# Patient Record
Sex: Male | Born: 1945
Health system: Southern US, Community
[De-identification: ages and names within clinical notes are randomized; demographics above are authoritative.]

## PROBLEM LIST (undated history)

## (undated) ENCOUNTER — Emergency Department (HOSPITAL_COMMUNITY): Admission: EM | Payer: Self-pay | Source: Home / Self Care

## (undated) DIAGNOSIS — E785 Hyperlipidemia, unspecified: Secondary | ICD-10-CM

## (undated) DIAGNOSIS — K219 Gastro-esophageal reflux disease without esophagitis: Secondary | ICD-10-CM

## (undated) DIAGNOSIS — E119 Type 2 diabetes mellitus without complications: Secondary | ICD-10-CM

## (undated) DIAGNOSIS — K2270 Barrett's esophagus without dysplasia: Secondary | ICD-10-CM

## (undated) DIAGNOSIS — I10 Essential (primary) hypertension: Secondary | ICD-10-CM

## (undated) DIAGNOSIS — N529 Male erectile dysfunction, unspecified: Secondary | ICD-10-CM

## (undated) DIAGNOSIS — D509 Iron deficiency anemia, unspecified: Secondary | ICD-10-CM

## (undated) DIAGNOSIS — N183 Chronic kidney disease, stage 3 unspecified: Secondary | ICD-10-CM

## (undated) DIAGNOSIS — T7840XA Allergy, unspecified, initial encounter: Secondary | ICD-10-CM

## (undated) DIAGNOSIS — K209 Esophagitis, unspecified without bleeding: Secondary | ICD-10-CM

## (undated) HISTORY — PX: UPPER ESOPHAGEAL ENDOSCOPIC ULTRASOUND (EUS): SHX6562

## (undated) HISTORY — DX: Type 2 diabetes mellitus without complications: E11.9

## (undated) HISTORY — DX: Allergy, unspecified, initial encounter: T78.40XA

## (undated) HISTORY — PX: FRACTURE SURGERY: SHX138

## (undated) HISTORY — PX: CARPAL TUNNEL RELEASE: SHX101

## (undated) HISTORY — DX: Chronic kidney disease, stage 3 unspecified: N18.30

## (undated) HISTORY — DX: Hyperlipidemia, unspecified: E78.5

## (undated) HISTORY — DX: Chronic kidney disease, stage 3 (moderate): N18.3

## (undated) HISTORY — DX: Essential (primary) hypertension: I10

## (undated) HISTORY — DX: Iron deficiency anemia, unspecified: D50.9

---
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
## 2004-03-22 ENCOUNTER — Ambulatory Visit: Payer: Self-pay | Admitting: Internal Medicine

## 2006-01-31 ENCOUNTER — Ambulatory Visit (HOSPITAL_BASED_OUTPATIENT_CLINIC_OR_DEPARTMENT_OTHER): Admission: RE | Admit: 2006-01-31 | Discharge: 2006-01-31 | Payer: Self-pay | Admitting: Orthopedic Surgery

## 2012-10-26 ENCOUNTER — Ambulatory Visit: Payer: Self-pay | Admitting: Gastroenterology

## 2012-10-27 LAB — PATHOLOGY REPORT

## 2013-10-01 ENCOUNTER — Ambulatory Visit: Payer: Self-pay | Admitting: Nephrology

## 2013-11-05 ENCOUNTER — Ambulatory Visit: Payer: Self-pay | Admitting: Gastroenterology

## 2013-11-05 LAB — BASIC METABOLIC PANEL
Anion Gap: 4 — ABNORMAL LOW (ref 7–16)
BUN: 13 mg/dL (ref 7–18)
CALCIUM: 9.1 mg/dL (ref 8.5–10.1)
CHLORIDE: 107 mmol/L (ref 98–107)
CO2: 26 mmol/L (ref 21–32)
Creatinine: 1.34 mg/dL — ABNORMAL HIGH (ref 0.60–1.30)
EGFR (Non-African Amer.): 54 — ABNORMAL LOW
GLUCOSE: 145 mg/dL — AB (ref 65–99)
OSMOLALITY: 277 (ref 275–301)
Potassium: 4.7 mmol/L (ref 3.5–5.1)
SODIUM: 137 mmol/L (ref 136–145)

## 2013-11-09 LAB — PATHOLOGY REPORT

## 2013-11-25 ENCOUNTER — Ambulatory Visit: Payer: Self-pay | Admitting: Gastroenterology

## 2013-11-29 LAB — PATHOLOGY REPORT

## 2014-05-17 ENCOUNTER — Ambulatory Visit: Payer: Self-pay | Admitting: Family Medicine

## 2014-05-20 ENCOUNTER — Ambulatory Visit (INDEPENDENT_AMBULATORY_CARE_PROVIDER_SITE_OTHER): Payer: Medicare Other | Admitting: Cardiovascular Disease

## 2014-05-20 ENCOUNTER — Encounter: Payer: Self-pay | Admitting: Cardiovascular Disease

## 2014-05-20 ENCOUNTER — Encounter (INDEPENDENT_AMBULATORY_CARE_PROVIDER_SITE_OTHER): Payer: Self-pay

## 2014-05-20 VITALS — BP 138/82 | HR 84 | Ht 71.0 in | Wt 221.5 lb

## 2014-05-20 DIAGNOSIS — E785 Hyperlipidemia, unspecified: Secondary | ICD-10-CM

## 2014-05-20 DIAGNOSIS — I10 Essential (primary) hypertension: Secondary | ICD-10-CM

## 2014-05-20 DIAGNOSIS — I129 Hypertensive chronic kidney disease with stage 1 through stage 4 chronic kidney disease, or unspecified chronic kidney disease: Secondary | ICD-10-CM | POA: Insufficient documentation

## 2014-05-20 DIAGNOSIS — R069 Unspecified abnormalities of breathing: Secondary | ICD-10-CM | POA: Insufficient documentation

## 2014-05-20 DIAGNOSIS — R0609 Other forms of dyspnea: Secondary | ICD-10-CM

## 2014-05-20 DIAGNOSIS — R9431 Abnormal electrocardiogram [ECG] [EKG]: Secondary | ICD-10-CM

## 2014-05-20 DIAGNOSIS — R079 Chest pain, unspecified: Secondary | ICD-10-CM

## 2014-05-20 HISTORY — DX: Essential (primary) hypertension: I10

## 2014-05-20 NOTE — Assessment & Plan Note (Signed)
BP is well controlled 

## 2014-05-20 NOTE — Progress Notes (Signed)
     Paul Spears Date of Birth  [birth year]       [facility] [address], [address], [place], [place]  [address]   [place], [place]  [address] [address]   Fax  [phone]     Fax [phone]  Problem List: 1. Dyspnea with exertion 2. Hypertension 3. Hyperlipidemia 4, Diabetes Mellitus 5.   History of Present Illness:  Paul Spears is a 68 yo referred from Dr. Ebbie Ridge office for evaluation for shortness of breath with exertion.  He typically would exercise in the summer.  Has not walked much this fall. Goes to the shooting range often.  Gets out of breath and very fatigued with walking up and down the shooting range.   No CP , just gets tired with walking. No orthopnea.  No syncopal episodes  Nonsmoker No ETOH Fhx:  Father died at age 65 of ? Pulmonary embolus,  Father had HTN,   Mother died of complications from a hip fracture.  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   Current Outpatient Prescriptions  Medication Sig Dispense Refill  . esomeprazole (NEXIUM 24HR) 20 MG capsule Take 20 mg by mouth daily at 12 noon.     Marland Kitchen glimepiride (AMARYL) 4 MG tablet Take 4 mg by mouth daily with breakfast.     . losartan (COZAAR) 50 MG tablet Take 50 mg by mouth daily.     . metFORMIN (GLUCOPHAGE) 1000 MG tablet Take 1,000 mg by mouth 2 (two) times daily with a meal.     . simvastatin (ZOCOR) 40 MG tablet Take 40 mg by mouth daily at 6 PM.      No current facility-administered medications for this visit.     No Known Allergies  Past Medical History  Diagnosis Date  . Hypertension   . Diabetes mellitus without complication   . Hyperlipidemia     History reviewed. No pertinent past surgical history.  History  Smoking status  . Never Smoker   Smokeless tobacco  . Not on file    History  Alcohol Use No    Family History  Problem Relation Age of Onset  . Hypertension Father   . Heart  attack Father   . Heart disease Sister   . Hypertension Sister   . Hyperlipidemia Sister     Reviw of Systems:  Reviewed in the HPI.  All other systems are negative.  Physical Exam: Blood pressure 138/82, pulse 84, height 5\' 11"  (1.803 m), weight 221 lb 8 oz (100.472 kg). Wt Readings from Last 3 Encounters:  05/20/14 221 lb 8 oz (100.472 kg)     General: Well developed, well nourished, in no acute distress.  Head: Normocephalic, atraumatic, sclera non-icteric, mucus membranes are moist,   Neck: Supple. Carotids are 2 + without bruits. No JVD   Lungs: Clear   Heart: RR, normal s1s2  Abdomen: Soft, non-tender, non-distended with normal bowel sounds.  Msk:  Strength and tone are normal   Extremities: No clubbing or cyanosis. No edema.  Distal pedal pulses are 2+ and equal    Neuro: CN II - XII intact.  Alert and oriented X 3.   Psych:  Normal   ECG: 05/17/2014: Normal sinus rhythm at 91. He has no ST or T wave changes.  Assessment / Plan:

## 2014-05-20 NOTE — Assessment & Plan Note (Signed)
Her presents for further evaluation of dyspnea on exertion. He states that he gets very short of breath and very fatigued with any type of exertion. He does not excise on a regular basis. He never has any episodes of chest pain.  He is diabetic and this dyspnea on exertion may be an anginal equivalent. He has a history of hypertension and hyperlipidemia.  We had a discussion about starting him on a regular exercise program. I have suggested that we have him come back next week for a stress Myoview study. At this time he does not want to have a stress Myoview study but would rather start a regular exercise program and see if he improves.  We will see him on an as-needed basis. I've encouraged him to call me back if he has any further chest pains or shortness of breath worsens. He'll return to his medical doctor and discuss these issues.

## 2014-05-20 NOTE — Patient Instructions (Signed)
Your physician recommends that you continue on your current medications as directed. Please refer to the Current Medication list given to you today.  Call or return to clinic prn if these symptoms worsen or fail to improve as anticipated.  

## 2014-09-21 DIAGNOSIS — K209 Esophagitis, unspecified without bleeding: Secondary | ICD-10-CM | POA: Insufficient documentation

## 2014-09-21 DIAGNOSIS — Z23 Encounter for immunization: Secondary | ICD-10-CM | POA: Insufficient documentation

## 2014-09-21 DIAGNOSIS — Z0000 Encounter for general adult medical examination without abnormal findings: Secondary | ICD-10-CM | POA: Insufficient documentation

## 2014-09-21 DIAGNOSIS — E875 Hyperkalemia: Secondary | ICD-10-CM | POA: Insufficient documentation

## 2014-09-21 DIAGNOSIS — E669 Obesity, unspecified: Secondary | ICD-10-CM | POA: Insufficient documentation

## 2014-09-21 DIAGNOSIS — K2100 Gastro-esophageal reflux disease with esophagitis, without bleeding: Secondary | ICD-10-CM | POA: Insufficient documentation

## 2014-09-21 DIAGNOSIS — K5792 Diverticulitis of intestine, part unspecified, without perforation or abscess without bleeding: Secondary | ICD-10-CM | POA: Insufficient documentation

## 2014-09-21 DIAGNOSIS — M8580 Other specified disorders of bone density and structure, unspecified site: Secondary | ICD-10-CM | POA: Insufficient documentation

## 2014-09-21 DIAGNOSIS — N1831 Chronic kidney disease, stage 3a: Secondary | ICD-10-CM | POA: Insufficient documentation

## 2014-09-21 DIAGNOSIS — N529 Male erectile dysfunction, unspecified: Secondary | ICD-10-CM

## 2014-09-21 DIAGNOSIS — E1121 Type 2 diabetes mellitus with diabetic nephropathy: Secondary | ICD-10-CM | POA: Insufficient documentation

## 2014-09-21 DIAGNOSIS — D649 Anemia, unspecified: Secondary | ICD-10-CM | POA: Insufficient documentation

## 2014-09-21 DIAGNOSIS — E1122 Type 2 diabetes mellitus with diabetic chronic kidney disease: Secondary | ICD-10-CM

## 2014-09-21 DIAGNOSIS — R0602 Shortness of breath: Secondary | ICD-10-CM | POA: Insufficient documentation

## 2014-09-21 DIAGNOSIS — M659 Synovitis and tenosynovitis, unspecified: Secondary | ICD-10-CM | POA: Insufficient documentation

## 2014-09-21 DIAGNOSIS — K2270 Barrett's esophagus without dysplasia: Secondary | ICD-10-CM | POA: Insufficient documentation

## 2014-09-21 DIAGNOSIS — E119 Type 2 diabetes mellitus without complications: Secondary | ICD-10-CM | POA: Insufficient documentation

## 2014-09-21 DIAGNOSIS — Z9181 History of falling: Secondary | ICD-10-CM | POA: Insufficient documentation

## 2014-09-21 DIAGNOSIS — N183 Chronic kidney disease, stage 3 unspecified: Secondary | ICD-10-CM | POA: Insufficient documentation

## 2014-09-21 DIAGNOSIS — I10 Essential (primary) hypertension: Secondary | ICD-10-CM | POA: Insufficient documentation

## 2014-09-21 DIAGNOSIS — R809 Proteinuria, unspecified: Secondary | ICD-10-CM | POA: Insufficient documentation

## 2014-09-21 DIAGNOSIS — E1165 Type 2 diabetes mellitus with hyperglycemia: Secondary | ICD-10-CM

## 2014-09-21 DIAGNOSIS — R1013 Epigastric pain: Secondary | ICD-10-CM | POA: Insufficient documentation

## 2014-09-21 DIAGNOSIS — E781 Pure hyperglyceridemia: Secondary | ICD-10-CM | POA: Insufficient documentation

## 2014-09-21 DIAGNOSIS — L219 Seborrheic dermatitis, unspecified: Secondary | ICD-10-CM | POA: Insufficient documentation

## 2014-09-21 DIAGNOSIS — M47812 Spondylosis without myelopathy or radiculopathy, cervical region: Secondary | ICD-10-CM | POA: Insufficient documentation

## 2014-09-21 DIAGNOSIS — Z1331 Encounter for screening for depression: Secondary | ICD-10-CM | POA: Insufficient documentation

## 2014-09-21 DIAGNOSIS — K219 Gastro-esophageal reflux disease without esophagitis: Secondary | ICD-10-CM

## 2014-09-21 DIAGNOSIS — M25519 Pain in unspecified shoulder: Secondary | ICD-10-CM | POA: Insufficient documentation

## 2014-09-21 DIAGNOSIS — R944 Abnormal results of kidney function studies: Secondary | ICD-10-CM | POA: Insufficient documentation

## 2014-09-21 HISTORY — DX: Male erectile dysfunction, unspecified: N52.9

## 2014-10-17 NOTE — Progress Notes (Signed)
Spoke with Paul Spears on the phone. Order received for EUS to be performed at [facility] for one year follow up from [facility]. He would like it performed towards the end of May since last EUS was 11-25-13. Information sent to [facility] for scheduling.

## 2014-11-25 ENCOUNTER — Ambulatory Visit (INDEPENDENT_AMBULATORY_CARE_PROVIDER_SITE_OTHER): Payer: Medicare Other | Admitting: Family Medicine

## 2014-11-25 ENCOUNTER — Encounter: Payer: Self-pay | Admitting: Family Medicine

## 2014-11-25 VITALS — BP 130/80 | HR 98 | Resp 15 | Ht 69.0 in | Wt 220.2 lb

## 2014-11-25 DIAGNOSIS — N183 Chronic kidney disease, stage 3 unspecified: Secondary | ICD-10-CM

## 2014-11-25 DIAGNOSIS — K219 Gastro-esophageal reflux disease without esophagitis: Secondary | ICD-10-CM

## 2014-11-25 DIAGNOSIS — E1165 Type 2 diabetes mellitus with hyperglycemia: Secondary | ICD-10-CM | POA: Diagnosis not present

## 2014-11-25 DIAGNOSIS — E785 Hyperlipidemia, unspecified: Secondary | ICD-10-CM | POA: Diagnosis not present

## 2014-11-25 DIAGNOSIS — I10 Essential (primary) hypertension: Secondary | ICD-10-CM

## 2014-11-25 DIAGNOSIS — IMO0002 Reserved for concepts with insufficient information to code with codable children: Secondary | ICD-10-CM

## 2014-11-25 MED ORDER — GLUCOSE BLOOD VI STRP: ORAL_STRIP | Status: DC

## 2014-11-25 MED ORDER — LOSARTAN POTASSIUM-HCTZ 50-12.5 MG PO TABS: 1.0000 | ORAL_TABLET | Freq: Every day | ORAL | Status: DC

## 2014-11-25 MED ORDER — GLIMEPIRIDE 4 MG PO TABS: 4.0000 mg | ORAL_TABLET | Freq: Two times a day (BID) | ORAL | Status: DC

## 2014-11-25 MED ORDER — METFORMIN HCL 1000 MG PO TABS: 1000.0000 mg | ORAL_TABLET | Freq: Two times a day (BID) | ORAL | Status: DC

## 2014-11-25 MED ORDER — SIMVASTATIN 40 MG PO TABS: 40.0000 mg | ORAL_TABLET | Freq: Every day | ORAL | Status: DC

## 2014-11-25 NOTE — Progress Notes (Signed)
Name: Paul Spears   MRN: [id number]    DOB: 28-Jul-1945   Date:11/25/2014       Progress Note  Subjective  Chief Complaint  Chief Complaint  Patient presents with  . Establish Care    Transfer from Dr. Jacqualine Code  . Diabetes  . Hypertension  . Hyperlipidemia    Diabetes He has type 2 diabetes mellitus. His disease course has been fluctuating. Hypoglycemia symptoms include dizziness, nervousness/anxiousness and pallor. Pertinent negatives for hypoglycemia include no headaches. Associated symptoms include fatigue and polydipsia. Pertinent negatives for diabetes include no blurred vision, no chest pain, no polyuria and no visual change. Diabetic complications include impotence. Pertinent negatives for diabetic complications include no CVA or heart disease. Risk factors for coronary artery disease include diabetes mellitus, dyslipidemia, hypertension and male sex. Current diabetic treatment includes oral agent (dual therapy). He is compliant with treatment all of the time. His weight is stable. He is following a diabetic and generally healthy diet. He has not had a previous visit with a dietitian. He participates in exercise three times a week. His breakfast blood glucose range is generally 140-180 mg/dl. An ACE inhibitor/angiotensin II receptor blocker is being taken. Eye exam is not current.  Hypertension This is a chronic problem. The problem is controlled. Associated symptoms include malaise/fatigue. Pertinent negatives include no anxiety, blurred vision, chest pain, headaches, palpitations or shortness of breath. Past treatments include angiotensin blockers and diuretics. The current treatment provides significant improvement. There are no compliance problems.  Hypertensive end-organ damage includes kidney disease. There is no history of CAD/MI, CVA or heart failure. Identifiable causes of hypertension include chronic renal disease.  Hyperlipidemia This is a chronic problem. The problem is  controlled. Exacerbating diseases include chronic renal disease. Pertinent negatives include no chest pain or shortness of breath. Current antihyperlipidemic treatment includes statins. There are no compliance problems.  Risk factors for coronary artery disease include diabetes mellitus, dyslipidemia and male sex.      Past Medical History  Diagnosis Date  . Hypertension   . Diabetes mellitus without complication   . Hyperlipidemia    Past Surgical History  Procedure Laterality Date  . Carpal tunnel release Right    Family History  Problem Relation Age of Onset  . Hypertension Father   . Heart attack Father   . Heart disease Sister   . Hypertension Sister   . Hyperlipidemia Sister    Patient Active Problem List   Diagnosis Date Noted  . Abdominal pain, epigastric 09/21/2014  . Acid reflux 09/21/2014  . Absolute anemia 09/21/2014  . Routine general medical examination at a health care facility 09/21/2014  . Barrett esophagus 09/21/2014  . Cervical osteoarthritis 09/21/2014  . Chronic kidney disease (CKD), stage III (moderate) 09/21/2014  . Pain in shoulder 09/21/2014  . Diabetes 09/21/2014  . Diverticulitis 09/21/2014  . Diabetes mellitus, type 2 09/21/2014  . Failure of erection 09/21/2014  . Esophagitis 09/21/2014  . High potassium 09/21/2014  . BP (high blood pressure) 09/21/2014  . Hypertriglyceridemia 09/21/2014  . Flu vaccine need 09/21/2014  . Abnormal kidney function study 09/21/2014  . Microalbuminuria 09/21/2014  . Adiposity 09/21/2014  . Osteopenia 09/21/2014  . Pneumococcal vaccination given 09/21/2014  . Screening for depression 09/21/2014  . At risk for falling 09/21/2014  . Dermatitis seborrheica 09/21/2014  . Breath shortness 09/21/2014  . Tenosynovitis 09/21/2014  . DM (diabetes mellitus) 05/20/2014  . HTN (hypertension) 05/20/2014  . Hyperlipidemia 05/20/2014  . Dyspnea on exertion 05/20/2014  History  Substance Use Topics  . Smoking  status: Never Smoker   . Smokeless tobacco: Never Used  . Alcohol Use: No     Current outpatient prescriptions:  .  esomeprazole (NEXIUM) 20 MG capsule, Take 20 mg by mouth daily at 12 noon. , Disp: , Rfl:  .  glimepiride (AMARYL) 4 MG tablet, Take 4 mg by mouth 2 (two) times daily. , Disp: , Rfl:  .  losartan-hydrochlorothiazide (HYZAAR) 50-12.5 MG per tablet, Take 1 tablet by mouth daily., Disp: , Rfl:  .  metFORMIN (GLUCOPHAGE) 1000 MG tablet, Take 1,000 mg by mouth 2 (two) times daily with a meal. , Disp: , Rfl:  .  simvastatin (ZOCOR) 40 MG tablet, Take 40 mg by mouth daily at 6 PM. , Disp: , Rfl:   No Known Allergies  Review of Systems  Constitutional: Positive for malaise/fatigue and fatigue.  Eyes: Negative for blurred vision.  Respiratory: Negative for shortness of breath.   Cardiovascular: Negative for chest pain and palpitations.  Genitourinary: Positive for impotence.  Skin: Positive for pallor.  Neurological: Positive for dizziness. Negative for headaches.  Endo/Heme/Allergies: Positive for polydipsia.  Psychiatric/Behavioral: The patient is nervous/anxious.       Objective  Filed Vitals:   11/25/14 0807  BP: 130/80  Pulse: 98  Resp: 15  Height: 5\' 9"  (1.753 m)  Weight: 220 lb 3.2 oz (99.882 kg)  SpO2: 96%     Physical Exam  Constitutional: He is oriented to person, place, and time and well-developed, well-nourished, and in no distress.  HENT:  Head: Normocephalic and atraumatic.  Cardiovascular: Normal rate and regular rhythm.   Pulmonary/Chest: Effort normal and breath sounds normal.  Abdominal: Soft. Bowel sounds are normal.  Neurological: He is alert and oriented to person, place, and time.  Nursing note and vitals reviewed.     No results found for this or any previous visit (from the past 2160 hour(s)).   Assessment & Plan 1. Essential hypertension Blood pressure is stable and controlled on present therapy. Continue current management -  losartan-hydrochlorothiazide (HYZAAR) 50-12.5 MG per tablet; Take 1 tablet by mouth daily.  Dispense: 30 tablet; Refill: 2  2. Diabetes mellitus type 2, uncontrolled We will repeat A1c and follow-up with medication management. - glimepiride (AMARYL) 4 MG tablet; Take 1 tablet (4 mg total) by mouth 2 (two) times daily.  Dispense: 60 tablet; Refill: 2 - metFORMIN (GLUCOPHAGE) 1000 MG tablet; Take 1 tablet (1,000 mg total) by mouth 2 (two) times daily with a meal.  Dispense: 60 tablet; Refill: 2 - glucose blood (ACCU-CHEK AVIVA PLUS) test strip; Use as instructed  Dispense: 100 each; Refill: 12 - HgB A1c  3. Chronic kidney disease (CKD), stage III (moderate) Patient being followed by nephrology.  4. Hyperlipidemia  - simvastatin (ZOCOR) 40 MG tablet; Take 1 tablet (40 mg total) by mouth daily at 6 PM.  Dispense: 30 tablet; Refill: 2 - Comprehensive metabolic panel - Lipid Profile   5. Gastroesophageal reflux disease, esophagitis presence not specified Currently on daily PPI therapy and followed by gastroenterology. Recent endoscopy results reviewed.     Kruti Horacek [facility] 11/25/2014 8:27 AM

## 2014-11-26 LAB — COMPREHENSIVE METABOLIC PANEL
ALT: 14 IU/L (ref 0–44)
AST: 17 IU/L (ref 0–40)
Albumin/Globulin Ratio: 2 (ref 1.1–2.5)
Albumin: 4.6 g/dL (ref 3.6–4.8)
Alkaline Phosphatase: 119 IU/L — ABNORMAL HIGH (ref 39–117)
BUN / CREAT RATIO: 12 (ref 10–22)
BUN: 16 mg/dL (ref 8–27)
Bilirubin Total: 0.5 mg/dL (ref 0.0–1.2)
CALCIUM: 9.2 mg/dL (ref 8.6–10.2)
CO2: 22 mmol/L (ref 18–29)
Chloride: 100 mmol/L (ref 97–108)
Creatinine, Ser: 1.37 mg/dL — ABNORMAL HIGH (ref 0.76–1.27)
GFR calc non Af Amer: 53 mL/min/{1.73_m2} — ABNORMAL LOW (ref >59)
GFR, EST AFRICAN AMERICAN: 61 mL/min/{1.73_m2} (ref >59)
GLOBULIN, TOTAL: 2.3 g/dL (ref 1.5–4.5)
GLUCOSE: 159 mg/dL — AB (ref 65–99)
Potassium: 5.2 mmol/L (ref 3.5–5.2)
Sodium: 138 mmol/L (ref 134–144)
Total Protein: 6.9 g/dL (ref 6.0–8.5)

## 2014-11-26 LAB — HEMOGLOBIN A1C
Est. average glucose Bld gHb Est-mCnc: 177 mg/dL
HEMOGLOBIN A1C: 7.8 % — AB (ref 4.8–5.6)

## 2014-11-26 LAB — LIPID PANEL
Chol/HDL Ratio: 5.3 ratio units — ABNORMAL HIGH (ref 0.0–5.0)
Cholesterol, Total: 185 mg/dL (ref 100–199)
HDL: 35 mg/dL — AB (ref >39)
LDL Calculated: 106 mg/dL — ABNORMAL HIGH (ref 0–99)
Triglycerides: 221 mg/dL — ABNORMAL HIGH (ref 0–149)
VLDL Cholesterol Cal: 44 mg/dL — ABNORMAL HIGH (ref 5–40)

## 2014-12-16 ENCOUNTER — Ambulatory Visit (INDEPENDENT_AMBULATORY_CARE_PROVIDER_SITE_OTHER): Payer: Medicare Other | Admitting: Family Medicine

## 2014-12-16 ENCOUNTER — Encounter: Payer: Self-pay | Admitting: Family Medicine

## 2014-12-16 VITALS — BP 120/76 | HR 120 | Temp 98.5°F | Ht 71.0 in | Wt 215.0 lb

## 2014-12-16 DIAGNOSIS — IMO0002 Reserved for concepts with insufficient information to code with codable children: Secondary | ICD-10-CM

## 2014-12-16 DIAGNOSIS — E1165 Type 2 diabetes mellitus with hyperglycemia: Secondary | ICD-10-CM

## 2014-12-16 DIAGNOSIS — E785 Hyperlipidemia, unspecified: Secondary | ICD-10-CM

## 2014-12-16 MED ORDER — GLIMEPIRIDE 4 MG PO TABS: 4.0000 mg | ORAL_TABLET | Freq: Every day | ORAL | Status: DC

## 2014-12-16 MED ORDER — LINAGLIPTIN 5 MG PO TABS: 5.0000 mg | ORAL_TABLET | Freq: Every day | ORAL | Status: DC

## 2014-12-16 MED ORDER — ATORVASTATIN CALCIUM 20 MG PO TABS: 20.0000 mg | ORAL_TABLET | Freq: Every day | ORAL | Status: DC

## 2014-12-16 NOTE — Progress Notes (Signed)
Name: Paul Spears   MRN: [id number]    DOB: 17-Aug-1945   Date:12/16/2014       Progress Note  Subjective  Chief Complaint  Chief Complaint  Patient presents with  . Follow-up    discuss stronger statin    Diabetes He presents for his follow-up diabetic visit. He has type 2 diabetes mellitus. His disease course has been worsening. Hypoglycemia symptoms include nervousness/anxiousness and sweats. Associated symptoms include fatigue, polydipsia and polyuria. Pertinent negatives for diabetes include no visual change and no weakness. Symptoms are stable. Pertinent negatives for diabetic complications include no CVA or heart disease. Risk factors for coronary artery disease include dyslipidemia, male sex and obesity. Current diabetic treatment includes oral agent (dual therapy). His weight is stable. He is following a diabetic and generally unhealthy (eats out frequently.) diet. He has not had a previous visit with a dietitian. He participates in exercise daily (walks two miles a day). His breakfast blood glucose is taken between 8-9 am. His breakfast blood glucose range is generally 140-180 mg/dl. An ACE inhibitor/angiotensin II receptor blocker is being taken.  Hyperlipidemia This is a chronic problem. The problem is uncontrolled. Recent lipid tests were reviewed and are high. Exacerbating diseases include diabetes and obesity. Current antihyperlipidemic treatment includes statins. The current treatment provides moderate improvement of lipids.      Past Medical History  Diagnosis Date  . Hypertension   . Diabetes mellitus without complication   . Hyperlipidemia     Past Surgical History  Procedure Laterality Date  . Carpal tunnel release Right     Family History  Problem Relation Age of Onset  . Hypertension Father   . Heart attack Father   . Heart disease Sister   . Hypertension Sister   . Hyperlipidemia Sister     History   Social History  . Marital Status: Divorced   Spouse Name: N/A  . Number of Children: N/A  . Years of Education: N/A   Occupational History  . Not on file.   Social History Main Topics  . Smoking status: Never Smoker   . Smokeless tobacco: Never Used  . Alcohol Use: No  . Drug Use: No  . Sexual Activity: Not on file   Other Topics Concern  . Not on file   Social History Narrative     Current outpatient prescriptions:  .  esomeprazole (NEXIUM) 20 MG capsule, Take 20 mg by mouth daily at 12 noon. , Disp: , Rfl:  .  glimepiride (AMARYL) 4 MG tablet, Take 1 tablet (4 mg total) by mouth 2 (two) times daily., Disp: 60 tablet, Rfl: 2 .  glucose blood (ACCU-CHEK AVIVA PLUS) test strip, Use as instructed, Disp: 100 each, Rfl: 12 .  losartan-hydrochlorothiazide (HYZAAR) 50-12.5 MG per tablet, Take 1 tablet by mouth daily., Disp: 30 tablet, Rfl: 2 .  metFORMIN (GLUCOPHAGE) 1000 MG tablet, Take 1 tablet (1,000 mg total) by mouth 2 (two) times daily with a meal., Disp: 60 tablet, Rfl: 2 .  simvastatin (ZOCOR) 40 MG tablet, Take 1 tablet (40 mg total) by mouth daily at 6 PM., Disp: 30 tablet, Rfl: 2  No Known Allergies   Review of Systems  Constitutional: Positive for fatigue.  Neurological: Negative for weakness.  Endo/Heme/Allergies: Positive for polydipsia.  Psychiatric/Behavioral: The patient is nervous/anxious.       Objective  Filed Vitals:   12/16/14 1136  BP: 120/76  Pulse: 120  Temp: 98.5 F (36.9 C)  TempSrc: Oral  Height: 5'  11" (1.803 m)  Weight: 215 lb (97.523 kg)  SpO2: 97%    Physical Exam  Constitutional: He is oriented to person, place, and time and well-developed, well-nourished, and in no distress.  HENT:  Head: Normocephalic and atraumatic.  Cardiovascular: Normal rate and regular rhythm.   Pulmonary/Chest: Effort normal and breath sounds normal.  Neurological: He is alert and oriented to person, place, and time.  Skin: Skin is warm and dry.  Nursing note and vitals reviewed.      Recent  Results (from the past 2160 hour(s))  Comprehensive metabolic panel     Status: Abnormal   Collection Time: 11/25/14  9:07 AM  Result Value Ref Range   Glucose 159 (H) 65 - 99 mg/dL   BUN 16 8 - 27 mg/dL   Creatinine, Ser 1.37 (H) 0.76 - 1.27 mg/dL   GFR calc non Af Amer 53 (L) >59 mL/min/1.73   GFR calc Af Amer 61 >59 mL/min/1.73   BUN/Creatinine Ratio 12 10 - 22   Sodium 138 134 - 144 mmol/L   Potassium 5.2 3.5 - 5.2 mmol/L   Chloride 100 97 - 108 mmol/L   CO2 22 18 - 29 mmol/L   Calcium 9.2 8.6 - 10.2 mg/dL   Total Protein 6.9 6.0 - 8.5 g/dL   Albumin 4.6 3.6 - 4.8 g/dL   Globulin, Total 2.3 1.5 - 4.5 g/dL   Albumin/Globulin Ratio 2.0 1.1 - 2.5   Bilirubin Total 0.5 0.0 - 1.2 mg/dL   Alkaline Phosphatase 119 (H) 39 - 117 IU/L   AST 17 0 - 40 IU/L   ALT 14 0 - 44 IU/L  HgB A1c     Status: Abnormal   Collection Time: 11/25/14  9:07 AM  Result Value Ref Range   Hgb A1c MFr Bld 7.8 (H) 4.8 - 5.6 %    Comment:          Pre-diabetes: 5.7 - 6.4          Diabetes: >6.4          Glycemic control for adults with diabetes: <7.0    Est. average glucose Bld gHb Est-mCnc 177 mg/dL  Lipid Profile     Status: Abnormal   Collection Time: 11/25/14  9:07 AM  Result Value Ref Range   Cholesterol, Total 185 100 - 199 mg/dL   Triglycerides 221 (H) 0 - 149 mg/dL   HDL 35 (L) >39 mg/dL    Comment: According to ATP-III Guidelines, HDL-C >59 mg/dL is considered a negative risk factor for CHD.    VLDL Cholesterol Cal 44 (H) 5 - 40 mg/dL   LDL Calculated 106 (H) 0 - 99 mg/dL   Chol/HDL Ratio 5.3 (H) 0.0 - 5.0 ratio units    Comment:                                   T. Chol/HDL Ratio                                             Men  Women                               1/2 Avg.Risk  3.4    3.3  Avg.Risk  5.0    4.4                                2X Avg.Risk  9.6    7.1                                3X Avg.Risk 23.4   11.0      Assessment & Plan 1.  Diabetes mellitus type 2, uncontrolled Last A1c was above goal at 7.8%. He'll be started on Tradjenta 5 mg daily. Because of hypoglycemic episodes, patient will decrease the dose of glimepiride from 4 mg twice daily to 4 mg once daily. Follow-up with repeat A1c in September 2016. - glimepiride (AMARYL) 4 MG tablet; Take 1 tablet (4 mg total) by mouth daily with breakfast.  Dispense: 30 tablet; Refill: 2 - linagliptin (TRADJENTA) 5 MG TABS tablet; Take 1 tablet (5 mg total) by mouth daily.  Dispense: 90 tablet; Refill: 0  2. Hyperlipidemia Because of elevated cholesterol, patient will be switched from simvastatin to atorvastatin. Follow-up with repeat fasting lipid panel in 3 months - atorvastatin (LIPITOR) 20 MG tablet; Take 1 tablet (20 mg total) by mouth daily.  Dispense: 90 tablet; Refill: 0    Attilio Zeitler [facility] 12/16/2014 11:58 AM

## 2014-12-29 ENCOUNTER — Encounter: Payer: Self-pay | Admitting: Family Medicine

## 2015-03-02 ENCOUNTER — Ambulatory Visit: Payer: Medicare Other | Admitting: Family Medicine

## 2015-03-08 DIAGNOSIS — N183 Chronic kidney disease, stage 3 (moderate): Secondary | ICD-10-CM | POA: Diagnosis not present

## 2015-03-08 DIAGNOSIS — E785 Hyperlipidemia, unspecified: Secondary | ICD-10-CM | POA: Diagnosis not present

## 2015-03-08 DIAGNOSIS — I10 Essential (primary) hypertension: Secondary | ICD-10-CM | POA: Diagnosis not present

## 2015-03-08 DIAGNOSIS — E1122 Type 2 diabetes mellitus with diabetic chronic kidney disease: Secondary | ICD-10-CM | POA: Diagnosis not present

## 2015-03-22 ENCOUNTER — Encounter: Payer: Self-pay | Admitting: Family Medicine

## 2015-03-22 ENCOUNTER — Ambulatory Visit (INDEPENDENT_AMBULATORY_CARE_PROVIDER_SITE_OTHER): Payer: Medicare Other | Admitting: Family Medicine

## 2015-03-22 VITALS — BP 128/78 | HR 113 | Temp 98.5°F | Resp 16 | Ht 71.0 in | Wt 214.1 lb

## 2015-03-22 DIAGNOSIS — IMO0002 Reserved for concepts with insufficient information to code with codable children: Secondary | ICD-10-CM

## 2015-03-22 DIAGNOSIS — R0989 Other specified symptoms and signs involving the circulatory and respiratory systems: Secondary | ICD-10-CM

## 2015-03-22 DIAGNOSIS — E1165 Type 2 diabetes mellitus with hyperglycemia: Secondary | ICD-10-CM | POA: Diagnosis not present

## 2015-03-22 DIAGNOSIS — N183 Chronic kidney disease, stage 3 unspecified: Secondary | ICD-10-CM

## 2015-03-22 DIAGNOSIS — E1122 Type 2 diabetes mellitus with diabetic chronic kidney disease: Secondary | ICD-10-CM | POA: Diagnosis not present

## 2015-03-22 DIAGNOSIS — E785 Hyperlipidemia, unspecified: Secondary | ICD-10-CM

## 2015-03-22 LAB — GLUCOSE, POCT (MANUAL RESULT ENTRY): POC GLUCOSE: 132 mg/dL — AB (ref 70–99)

## 2015-03-22 LAB — POCT GLYCOSYLATED HEMOGLOBIN (HGB A1C): Hemoglobin A1C: 6.9

## 2015-03-22 MED ORDER — GLIMEPIRIDE 4 MG PO TABS: 4.0000 mg | ORAL_TABLET | Freq: Every day | ORAL | Status: DC

## 2015-03-22 MED ORDER — ATORVASTATIN CALCIUM 20 MG PO TABS: 20.0000 mg | ORAL_TABLET | Freq: Every day | ORAL | Status: DC

## 2015-03-22 MED ORDER — METFORMIN HCL 1000 MG PO TABS: 1000.0000 mg | ORAL_TABLET | Freq: Two times a day (BID) | ORAL | Status: DC

## 2015-03-22 NOTE — Progress Notes (Signed)
Name: Paul Spears   MRN: [id number]    DOB: 04-26-1946   Date:03/22/2015       Progress Note  Subjective  Chief Complaint  Chief Complaint  Patient presents with  . Hyperlipidemia    Pt here for 3 month follow up  . Diabetes  . Gastrophageal Reflux  . Obesity  . Allergic Rhinitis     sneezing and stuffiness since Sunday    Hyperlipidemia This is a chronic problem. The problem is uncontrolled. Recent lipid tests were reviewed and are high. Pertinent negatives include no leg pain, myalgias or shortness of breath. Current antihyperlipidemic treatment includes statins.  Diabetes He presents for his follow-up diabetic visit. He has type 2 diabetes mellitus. His disease course has been stable. Diabetic complications include nephropathy. Current diabetic treatment includes oral agent (dual therapy). He is following a generally unhealthy (admits to eating 'jink food', late at night) diet. His breakfast blood glucose range is generally 110-130 mg/dl. An ACE inhibitor/angiotensin II receptor blocker is being taken.   Past Medical History  Diagnosis Date  . Hypertension   . Diabetes mellitus without complication ([place])   . Hyperlipidemia     Past Surgical History  Procedure Laterality Date  . Carpal tunnel release Right     Family History  Problem Relation Age of Onset  . Hypertension Father   . Heart attack Father   . Heart disease Sister   . Hypertension Sister   . Hyperlipidemia Sister     Social History   Social History  . Marital Status: Divorced    Spouse Name: N/A  . Number of Children: N/A  . Years of Education: N/A   Occupational History  . Not on file.   Social History Main Topics  . Smoking status: Never Smoker   . Smokeless tobacco: Never Used  . Alcohol Use: No  . Drug Use: No  . Sexual Activity: Not on file   Other Topics Concern  . Not on file   Social History Narrative    Current outpatient prescriptions:  .  ACCU-CHEK SOFTCLIX LANCETS  lancets, , Disp: , Rfl:  .  atorvastatin (LIPITOR) 20 MG tablet, Take 1 tablet (20 mg total) by mouth daily., Disp: 90 tablet, Rfl: 0 .  esomeprazole (NEXIUM) 20 MG capsule, Take 20 mg by mouth daily at 12 noon. , Disp: , Rfl:  .  glimepiride (AMARYL) 4 MG tablet, Take 1 tablet (4 mg total) by mouth daily with breakfast., Disp: 30 tablet, Rfl: 2 .  glucose blood (ACCU-CHEK AVIVA PLUS) test strip, Use as instructed, Disp: 100 each, Rfl: 12 .  linagliptin (TRADJENTA) 5 MG TABS tablet, Take 1 tablet (5 mg total) by mouth daily., Disp: 90 tablet, Rfl: 0 .  losartan-hydrochlorothiazide (HYZAAR) 50-12.5 MG per tablet, Take 1 tablet by mouth daily., Disp: 30 tablet, Rfl: 2 .  metFORMIN (GLUCOPHAGE) 1000 MG tablet, Take 1 tablet (1,000 mg total) by mouth 2 (two) times daily with a meal., Disp: 60 tablet, Rfl: 2 .  Vitamin D, Ergocalciferol, (DRISDOL) 50000 UNITS CAPS capsule, Take 50,000 Units by mouth every 7 (seven) days., Disp: , Rfl:   No Known Allergies   Review of Systems  Respiratory: Negative for shortness of breath.   Musculoskeletal: Negative for myalgias.    Objective  Filed Vitals:   03/22/15 0923  BP: 128/78  Pulse: 113  Temp: 98.5 F (36.9 C)  Resp: 16  Height: 5\' 11"  (1.803 m)  Weight: 214 lb 2 oz (97.126 kg)  SpO2: 96%    Physical Exam  Constitutional: He is oriented to person, place, and time and well-developed, well-nourished, and in no distress.  Cardiovascular: Normal rate and regular rhythm.   Pulmonary/Chest: Effort normal and breath sounds normal.  Abdominal: Soft. Bowel sounds are normal.  Neurological: He is alert and oriented to person, place, and time.  Skin: Skin is warm and dry.  Nursing note and vitals reviewed.  Assessment & Plan  1. Hyperlipidemia Switched to higher intensity statin after elevated triglycerides and LDL at last office visit. Repeat FLP today and follow-up - Lipid Profile - atorvastatin (LIPITOR) 20 MG tablet; Take 1 tablet (20 mg  total) by mouth daily.  Dispense: 30 tablet; Refill: 2  2. Chronic kidney disease (CKD), stage III (moderate) Patient is being followed by nephrology. - Comprehensive Metabolic Panel (CMET)  3. Uncontrolled type 2 diabetes mellitus with stage 3 chronic kidney disease, without long-term current use of insulin (HCC) A1c of 6.9%, consistent with well-controlled and at goal diabetes mellitus. Continue present therapy and recheck in 3-4 months. - POCT Glucose (CBG) - POCT HgB A1C - glimepiride (AMARYL) 4 MG tablet; Take 1 tablet (4 mg total) by mouth daily with breakfast.  Dispense: 30 tablet; Refill: 2 - metFORMIN (GLUCOPHAGE) 1000 MG tablet; Take 1 tablet (1,000 mg total) by mouth 2 (two) times daily with a meal.  Dispense: 60 tablet; Refill: 2  4. Absent peripheral pulse Dorsalis pedis pulses not palpable in both feet. Referral to vascular surgery for further evaluation. - Ambulatory referral to Vascular Surgery    Paul Spears [facility] 03/22/2015 9:42 AM

## 2015-04-24 DIAGNOSIS — E785 Hyperlipidemia, unspecified: Secondary | ICD-10-CM | POA: Diagnosis not present

## 2015-04-24 DIAGNOSIS — I10 Essential (primary) hypertension: Secondary | ICD-10-CM | POA: Diagnosis not present

## 2015-04-24 DIAGNOSIS — I872 Venous insufficiency (chronic) (peripheral): Secondary | ICD-10-CM | POA: Diagnosis not present

## 2015-04-24 DIAGNOSIS — M79609 Pain in unspecified limb: Secondary | ICD-10-CM | POA: Diagnosis not present

## 2015-04-24 DIAGNOSIS — I739 Peripheral vascular disease, unspecified: Secondary | ICD-10-CM | POA: Diagnosis not present

## 2015-05-29 DIAGNOSIS — N183 Chronic kidney disease, stage 3 (moderate): Secondary | ICD-10-CM | POA: Diagnosis not present

## 2015-05-29 DIAGNOSIS — I129 Hypertensive chronic kidney disease with stage 1 through stage 4 chronic kidney disease, or unspecified chronic kidney disease: Secondary | ICD-10-CM | POA: Diagnosis not present

## 2015-05-29 DIAGNOSIS — E1122 Type 2 diabetes mellitus with diabetic chronic kidney disease: Secondary | ICD-10-CM | POA: Diagnosis not present

## 2015-05-29 DIAGNOSIS — R809 Proteinuria, unspecified: Secondary | ICD-10-CM | POA: Diagnosis not present

## 2015-05-29 DIAGNOSIS — E559 Vitamin D deficiency, unspecified: Secondary | ICD-10-CM | POA: Diagnosis not present

## 2015-06-19 ENCOUNTER — Other Ambulatory Visit: Payer: Self-pay | Admitting: Family Medicine

## 2015-06-23 ENCOUNTER — Ambulatory Visit (INDEPENDENT_AMBULATORY_CARE_PROVIDER_SITE_OTHER): Payer: Medicare Other | Admitting: Family Medicine

## 2015-06-23 ENCOUNTER — Encounter: Payer: Self-pay | Admitting: Family Medicine

## 2015-06-23 VITALS — BP 126/70 | HR 117 | Temp 97.6°F | Resp 18 | Ht 71.0 in | Wt 212.2 lb

## 2015-06-23 DIAGNOSIS — E785 Hyperlipidemia, unspecified: Secondary | ICD-10-CM

## 2015-06-23 DIAGNOSIS — E118 Type 2 diabetes mellitus with unspecified complications: Secondary | ICD-10-CM | POA: Diagnosis not present

## 2015-06-23 LAB — POCT GLYCOSYLATED HEMOGLOBIN (HGB A1C): HEMOGLOBIN A1C: 7.2

## 2015-06-23 LAB — GLUCOSE, POCT (MANUAL RESULT ENTRY): POC Glucose: 138 mg/dl — AB (ref 70–99)

## 2015-06-23 NOTE — Progress Notes (Signed)
Name: Paul Spears   MRN: [id number]    DOB: 08-17-1945   Date:06/23/2015       Progress Note  Subjective  Chief Complaint  Chief Complaint  Patient presents with  . Follow-up    3 mo  . Diabetes  . Gastroesophageal Reflux  . Hyperlipidemia  . Anemia  . Medication Refill    Diabetes He presents for his follow-up diabetic visit. He has type 2 diabetes mellitus. His disease course has been stable. Hypoglycemia symptoms include nervousness/anxiousness (shakes). (Blood Glucose drops at times.) Pertinent negatives for diabetes include no fatigue, no foot paresthesias, no polydipsia and no polyuria. Symptoms are stable. There are no diabetic complications. His weight is stable. His breakfast blood glucose range is generally 110-130 mg/dl. An ACE inhibitor/angiotensin II receptor blocker is being taken.  Hyperlipidemia This is a chronic problem. The problem is controlled. Recent lipid tests were reviewed and are high. Pertinent negatives include no leg pain, myalgias or shortness of breath. Current antihyperlipidemic treatment includes statins.    Past Medical History  Diagnosis Date  . Hypertension   . Diabetes mellitus without complication ([place])   . Hyperlipidemia     Past Surgical History  Procedure Laterality Date  . Carpal tunnel release Right     Family History  Problem Relation Age of Onset  . Hypertension Father   . Heart attack Father   . Heart disease Sister   . Hypertension Sister   . Hyperlipidemia Sister     Social History   Social History  . Marital Status: Divorced    Spouse Name: N/A  . Number of Children: N/A  . Years of Education: N/A   Occupational History  . Not on file.   Social History Main Topics  . Smoking status: Never Smoker   . Smokeless tobacco: Never Used  . Alcohol Use: No  . Drug Use: No  . Sexual Activity: Not on file   Other Topics Concern  . Not on file   Social History Narrative     Current outpatient prescriptions:  .   ACCU-CHEK SOFTCLIX LANCETS lancets, , Disp: , Rfl:  .  atorvastatin (LIPITOR) 20 MG tablet, Take 1 tablet (20 mg total) by mouth daily., Disp: 30 tablet, Rfl: 2 .  esomeprazole (NEXIUM) 20 MG capsule, Take 20 mg by mouth daily at 12 noon. , Disp: , Rfl:  .  glimepiride (AMARYL) 4 MG tablet, Take 1 tablet (4 mg total) by mouth daily with breakfast., Disp: 30 tablet, Rfl: 2 .  glucose blood (ACCU-CHEK AVIVA PLUS) test strip, Use as instructed, Disp: 100 each, Rfl: 12 .  losartan-hydrochlorothiazide (HYZAAR) 50-12.5 MG per tablet, Take 1 tablet by mouth daily., Disp: 30 tablet, Rfl: 2 .  metFORMIN (GLUCOPHAGE) 1000 MG tablet, Take 1 tablet (1,000 mg total) by mouth 2 (two) times daily with a meal., Disp: 60 tablet, Rfl: 2 .  Vitamin D, Ergocalciferol, (DRISDOL) 50000 UNITS CAPS capsule, Take 50,000 Units by mouth every 7 (seven) days., Disp: , Rfl:   No Known Allergies   Review of Systems  Constitutional: Negative for fatigue.  Respiratory: Negative for shortness of breath.   Musculoskeletal: Negative for myalgias.  Endo/Heme/Allergies: Negative for polydipsia.  Psychiatric/Behavioral: The patient is nervous/anxious (shakes).     Objective  Filed Vitals:   06/23/15 0808  BP: 126/70  Pulse: 117  Temp: 97.6 F (36.4 C)  TempSrc: Oral  Resp: 18  Height: 5\' 11"  (1.803 m)  Weight: 212 lb 3.2 oz (96.253 kg)  SpO2: 97%    Physical Exam  Constitutional: He is oriented to person, place, and time and well-developed, well-nourished, and in no distress.  Cardiovascular: Normal rate and regular rhythm.   Pulmonary/Chest: Effort normal and breath sounds normal.  Abdominal: Soft. Bowel sounds are normal.  Neurological: He is alert and oriented to person, place, and time.  Skin: Skin is warm and dry.  Nursing note and vitals reviewed.    Assessment & Plan  1. Controlled type 2 diabetes mellitus with complication, without long-term current use of insulin (HCC) A1c 7.2%, well controlled  DM. DC glimepiride 2/2 hypoglycemic episodes. Advised to take metformin 1000 mg twice daily and recheck A1c in 3-4 months.  - POCT HgB A1C - POCT Glucose (CBG)  2. Hyperlipidemia  - Lipid Profile - Comprehensive Metabolic Panel (CMET)   Enma Maeda [facility] 06/23/2015 8:19 AM

## 2015-06-24 LAB — COMPREHENSIVE METABOLIC PANEL
ALT: 18 IU/L (ref 0–44)
AST: 20 IU/L (ref 0–40)
Albumin/Globulin Ratio: 2 (ref 1.1–2.5)
Albumin: 4.7 g/dL (ref 3.6–4.8)
Alkaline Phosphatase: 131 IU/L — ABNORMAL HIGH (ref 39–117)
BUN/Creatinine Ratio: 10 (ref 10–22)
BUN: 14 mg/dL (ref 8–27)
Bilirubin Total: 0.6 mg/dL (ref 0.0–1.2)
CALCIUM: 9.8 mg/dL (ref 8.6–10.2)
CO2: 23 mmol/L (ref 18–29)
Chloride: 98 mmol/L (ref 96–106)
Creatinine, Ser: 1.4 mg/dL — ABNORMAL HIGH (ref 0.76–1.27)
GFR, EST AFRICAN AMERICAN: 59 mL/min/{1.73_m2} — AB (ref >59)
GFR, EST NON AFRICAN AMERICAN: 51 mL/min/{1.73_m2} — AB (ref >59)
GLUCOSE: 144 mg/dL — AB (ref 65–99)
Globulin, Total: 2.4 g/dL (ref 1.5–4.5)
Potassium: 5.1 mmol/L (ref 3.5–5.2)
Sodium: 137 mmol/L (ref 134–144)
TOTAL PROTEIN: 7.1 g/dL (ref 6.0–8.5)

## 2015-06-24 LAB — LIPID PANEL
CHOL/HDL RATIO: 3.9 ratio (ref 0.0–5.0)
Cholesterol, Total: 160 mg/dL (ref 100–199)
HDL: 41 mg/dL (ref >39)
LDL Calculated: 95 mg/dL (ref 0–99)
TRIGLYCERIDES: 122 mg/dL (ref 0–149)
VLDL Cholesterol Cal: 24 mg/dL (ref 5–40)

## 2015-06-28 ENCOUNTER — Other Ambulatory Visit: Payer: Self-pay | Admitting: Family Medicine

## 2015-07-16 ENCOUNTER — Other Ambulatory Visit: Payer: Self-pay | Admitting: Family Medicine

## 2015-08-18 ENCOUNTER — Other Ambulatory Visit: Payer: Self-pay | Admitting: Family Medicine

## 2015-09-08 ENCOUNTER — Other Ambulatory Visit: Payer: Self-pay | Admitting: Family Medicine

## 2015-09-08 NOTE — Telephone Encounter (Signed)
Glimepiride (Amaryl) 4 mg tablet has been refilled and sent to [place]

## 2015-09-17 ENCOUNTER — Other Ambulatory Visit: Payer: Self-pay | Admitting: Family Medicine

## 2015-09-18 NOTE — Telephone Encounter (Signed)
Medication has been refilled and sent to [facility] [address] 

## 2015-09-25 ENCOUNTER — Ambulatory Visit (INDEPENDENT_AMBULATORY_CARE_PROVIDER_SITE_OTHER): Payer: Medicare Other | Admitting: Family Medicine

## 2015-09-25 ENCOUNTER — Encounter: Payer: Self-pay | Admitting: Family Medicine

## 2015-09-25 VITALS — BP 122/68 | HR 101 | Temp 98.0°F | Resp 18 | Ht 71.0 in | Wt 211.9 lb

## 2015-09-25 DIAGNOSIS — E1122 Type 2 diabetes mellitus with diabetic chronic kidney disease: Secondary | ICD-10-CM | POA: Diagnosis not present

## 2015-09-25 DIAGNOSIS — R002 Palpitations: Secondary | ICD-10-CM | POA: Diagnosis not present

## 2015-09-25 DIAGNOSIS — E785 Hyperlipidemia, unspecified: Secondary | ICD-10-CM | POA: Diagnosis not present

## 2015-09-25 DIAGNOSIS — IMO0002 Reserved for concepts with insufficient information to code with codable children: Secondary | ICD-10-CM

## 2015-09-25 DIAGNOSIS — N183 Chronic kidney disease, stage 3 (moderate): Secondary | ICD-10-CM

## 2015-09-25 DIAGNOSIS — E1165 Type 2 diabetes mellitus with hyperglycemia: Secondary | ICD-10-CM | POA: Diagnosis not present

## 2015-09-25 LAB — GLUCOSE, POCT (MANUAL RESULT ENTRY): POC GLUCOSE: 114 mg/dL — AB (ref 70–99)

## 2015-09-25 LAB — POCT UA - MICROALBUMIN: MICROALBUMIN (UR) POC: 20 mg/L

## 2015-09-25 LAB — POCT GLYCOSYLATED HEMOGLOBIN (HGB A1C): Hemoglobin A1C: 7.4

## 2015-09-25 MED ORDER — METFORMIN HCL 1000 MG PO TABS: 1000.0000 mg | ORAL_TABLET | Freq: Two times a day (BID) | ORAL | Status: DC

## 2015-09-25 MED ORDER — ATORVASTATIN CALCIUM 20 MG PO TABS: 20.0000 mg | ORAL_TABLET | Freq: Every day | ORAL | Status: DC

## 2015-09-25 NOTE — Progress Notes (Signed)
Name: Paul Spears   MRN: [id number]    DOB: May 02, 1946   Date:09/25/2015       Progress Note  Subjective  Chief Complaint  Chief Complaint  Patient presents with  . Follow-up    3 mo  . Diabetes    Diabetes He presents for his follow-up diabetic visit. He has type 2 diabetes mellitus. His disease course has been stable. Associated symptoms include chest pain (chest pain, palpitations intermittent.). Current diabetic treatment includes oral agent (dual therapy). His weight is stable. He is following a diabetic diet. His breakfast blood glucose range is generally 90-110 mg/dl. An ACE inhibitor/angiotensin II receptor blocker is being taken. Eye exam is current.  Hyperlipidemia This is a chronic problem. The problem is controlled. Exacerbating diseases include diabetes. Associated symptoms include chest pain (chest pain, palpitations intermittent.) and myalgias. Current antihyperlipidemic treatment includes statins.     Past Medical History  Diagnosis Date  . Hypertension   . Diabetes mellitus without complication ([place])   . Hyperlipidemia     Past Surgical History  Procedure Laterality Date  . Carpal tunnel release Right     Family History  Problem Relation Age of Onset  . Hypertension Father   . Heart attack Father   . Heart disease Sister   . Hypertension Sister   . Hyperlipidemia Sister     Social History   Social History  . Marital Status: Divorced    Spouse Name: N/A  . Number of Children: N/A  . Years of Education: N/A   Occupational History  . Not on file.   Social History Main Topics  . Smoking status: Never Smoker   . Smokeless tobacco: Never Used  . Alcohol Use: No  . Drug Use: No  . Sexual Activity: Not on file   Other Topics Concern  . Not on file   Social History Narrative     Current outpatient prescriptions:  .  ACCU-CHEK SOFTCLIX LANCETS lancets, , Disp: , Rfl:  .  atorvastatin (LIPITOR) 20 MG tablet, Take 1 tablet (20 mg total) by  mouth daily., Disp: 90 tablet, Rfl: 0 .  esomeprazole (NEXIUM) 20 MG capsule, Take 20 mg by mouth daily at 12 noon. , Disp: , Rfl:  .  glimepiride (AMARYL) 4 MG tablet, Take 1 tablet (4 mg total) by mouth daily with breakfast., Disp: 90 tablet, Rfl: 0 .  glucose blood (ACCU-CHEK AVIVA PLUS) test strip, Use as instructed, Disp: 100 each, Rfl: 12 .  losartan-hydrochlorothiazide (HYZAAR) 50-12.5 MG per tablet, Take 1 tablet by mouth daily., Disp: 30 tablet, Rfl: 2 .  metFORMIN (GLUCOPHAGE) 1000 MG tablet, TAKE ONE TABLET BY MOUTH TWICE DAILY WITH MEALS, Disp: 60 tablet, Rfl: 0 .  Vitamin D, Ergocalciferol, (DRISDOL) 50000 UNITS CAPS capsule, Take 50,000 Units by mouth every 7 (seven) days., Disp: , Rfl:   No Known Allergies   Review of Systems  Constitutional: Negative for fever and chills.  Cardiovascular: Positive for chest pain (chest pain, palpitations intermittent.).  Gastrointestinal: Negative for abdominal pain.  Musculoskeletal: Positive for myalgias.     Objective  Filed Vitals:   09/25/15 0835  BP: 122/68  Pulse: 101  Temp: 98 F (36.7 C)  TempSrc: Oral  Resp: 18  Height: 5\' 11"  (1.803 m)  Weight: 211 lb 14.4 oz (96.117 kg)  SpO2: 96%    Physical Exam  Constitutional: He is oriented to person, place, and time and well-developed, well-nourished, and in no distress.  Cardiovascular: Normal rate and regular  rhythm.   Pulmonary/Chest: Effort normal and breath sounds normal.  Abdominal: Soft. Bowel sounds are normal.  Neurological: He is alert and oriented to person, place, and time.  Psychiatric: Mood, memory, affect and judgment normal.  Nursing note and vitals reviewed.       Assessment & Plan  1. Hyperlipidemia  - atorvastatin (LIPITOR) 20 MG tablet; Take 1 tablet (20 mg total) by mouth daily.  Dispense: 90 tablet; Refill: 0 - Lipid Profile - Comprehensive Metabolic Panel (CMET)  2. Uncontrolled type 2 diabetes mellitus with stage 3 chronic kidney  disease, without long-term current use of insulin (HCC)  - metFORMIN (GLUCOPHAGE) 1000 MG tablet; Take 1 tablet (1,000 mg total) by mouth 2 (two) times daily with a meal.  Dispense: 180 tablet; Refill: 0 - POCT UA - Microalbumin - POCT HgB A1C - POCT Glucose (CBG)  3. Intermittent palpitations EKG is normal sinus rhythm, no palpitations auscultated on cardiovascular exam. Referral to cardiology. - EKG 12-Lead - Ambulatory referral to Cardiology   [facility] 09/25/2015 8:40 AM

## 2015-09-26 LAB — LIPID PANEL
CHOL/HDL RATIO: 3.5 ratio (ref 0.0–5.0)
Cholesterol, Total: 149 mg/dL (ref 100–199)
HDL: 43 mg/dL (ref >39)
LDL Calculated: 86 mg/dL (ref 0–99)
Triglycerides: 100 mg/dL (ref 0–149)
VLDL Cholesterol Cal: 20 mg/dL (ref 5–40)

## 2015-09-26 LAB — COMPREHENSIVE METABOLIC PANEL
ALBUMIN: 4.5 g/dL (ref 3.6–4.8)
ALT: 12 IU/L (ref 0–44)
AST: 16 IU/L (ref 0–40)
Albumin/Globulin Ratio: 1.9 (ref 1.2–2.2)
Alkaline Phosphatase: 129 IU/L — ABNORMAL HIGH (ref 39–117)
BUN / CREAT RATIO: 10 (ref 10–24)
BUN: 15 mg/dL (ref 8–27)
Bilirubin Total: 0.5 mg/dL (ref 0.0–1.2)
CALCIUM: 9.4 mg/dL (ref 8.6–10.2)
CO2: 22 mmol/L (ref 18–29)
CREATININE: 1.44 mg/dL — AB (ref 0.76–1.27)
Chloride: 101 mmol/L (ref 96–106)
GFR, EST AFRICAN AMERICAN: 57 mL/min/{1.73_m2} — AB (ref >59)
GFR, EST NON AFRICAN AMERICAN: 49 mL/min/{1.73_m2} — AB (ref >59)
GLUCOSE: 139 mg/dL — AB (ref 65–99)
Globulin, Total: 2.4 g/dL (ref 1.5–4.5)
Potassium: 5.6 mmol/L — ABNORMAL HIGH (ref 3.5–5.2)
Sodium: 140 mmol/L (ref 134–144)
TOTAL PROTEIN: 6.9 g/dL (ref 6.0–8.5)

## 2015-09-27 DIAGNOSIS — R002 Palpitations: Secondary | ICD-10-CM | POA: Diagnosis not present

## 2015-09-27 DIAGNOSIS — I208 Other forms of angina pectoris: Secondary | ICD-10-CM | POA: Diagnosis not present

## 2015-09-27 DIAGNOSIS — R0602 Shortness of breath: Secondary | ICD-10-CM | POA: Diagnosis not present

## 2015-09-27 DIAGNOSIS — R011 Cardiac murmur, unspecified: Secondary | ICD-10-CM | POA: Diagnosis not present

## 2015-09-27 DIAGNOSIS — K219 Gastro-esophageal reflux disease without esophagitis: Secondary | ICD-10-CM | POA: Diagnosis not present

## 2015-10-12 DIAGNOSIS — R002 Palpitations: Secondary | ICD-10-CM | POA: Diagnosis not present

## 2015-10-12 DIAGNOSIS — R0602 Shortness of breath: Secondary | ICD-10-CM | POA: Diagnosis not present

## 2015-10-16 DIAGNOSIS — R0602 Shortness of breath: Secondary | ICD-10-CM | POA: Diagnosis not present

## 2015-10-16 DIAGNOSIS — I208 Other forms of angina pectoris: Secondary | ICD-10-CM | POA: Diagnosis not present

## 2015-10-16 DIAGNOSIS — R011 Cardiac murmur, unspecified: Secondary | ICD-10-CM | POA: Diagnosis not present

## 2015-10-30 DIAGNOSIS — K219 Gastro-esophageal reflux disease without esophagitis: Secondary | ICD-10-CM | POA: Diagnosis not present

## 2015-10-30 DIAGNOSIS — R0602 Shortness of breath: Secondary | ICD-10-CM | POA: Diagnosis not present

## 2015-10-30 DIAGNOSIS — R011 Cardiac murmur, unspecified: Secondary | ICD-10-CM | POA: Diagnosis not present

## 2015-10-30 DIAGNOSIS — I208 Other forms of angina pectoris: Secondary | ICD-10-CM | POA: Diagnosis not present

## 2015-10-30 DIAGNOSIS — R002 Palpitations: Secondary | ICD-10-CM | POA: Diagnosis not present

## 2015-11-10 ENCOUNTER — Encounter: Payer: Self-pay | Admitting: Family Medicine

## 2015-12-01 DIAGNOSIS — E559 Vitamin D deficiency, unspecified: Secondary | ICD-10-CM | POA: Diagnosis not present

## 2015-12-01 DIAGNOSIS — E875 Hyperkalemia: Secondary | ICD-10-CM | POA: Diagnosis not present

## 2015-12-01 DIAGNOSIS — N183 Chronic kidney disease, stage 3 (moderate): Secondary | ICD-10-CM | POA: Diagnosis not present

## 2015-12-01 DIAGNOSIS — E1122 Type 2 diabetes mellitus with diabetic chronic kidney disease: Secondary | ICD-10-CM | POA: Diagnosis not present

## 2015-12-01 DIAGNOSIS — I129 Hypertensive chronic kidney disease with stage 1 through stage 4 chronic kidney disease, or unspecified chronic kidney disease: Secondary | ICD-10-CM | POA: Diagnosis not present

## 2015-12-04 ENCOUNTER — Ambulatory Visit
Admission: RE | Admit: 2015-12-04 | Discharge: 2015-12-04 | Disposition: A | Discharge status: Home / Self Care | Payer: Medicare Other | Source: Ambulatory Visit | Attending: Family Medicine | Admitting: Family Medicine

## 2015-12-04 ENCOUNTER — Encounter: Payer: Self-pay | Admitting: Family Medicine

## 2015-12-04 ENCOUNTER — Other Ambulatory Visit: Payer: Self-pay | Admitting: Family Medicine

## 2015-12-04 ENCOUNTER — Ambulatory Visit (INDEPENDENT_AMBULATORY_CARE_PROVIDER_SITE_OTHER): Payer: Medicare Other | Admitting: Family Medicine

## 2015-12-04 VITALS — BP 118/78 | HR 114 | Temp 98.4°F | Resp 18 | Ht 71.0 in | Wt 215.6 lb

## 2015-12-04 DIAGNOSIS — M542 Cervicalgia: Secondary | ICD-10-CM

## 2015-12-04 DIAGNOSIS — M479 Spondylosis, unspecified: Secondary | ICD-10-CM | POA: Diagnosis not present

## 2015-12-04 DIAGNOSIS — M47812 Spondylosis without myelopathy or radiculopathy, cervical region: Secondary | ICD-10-CM | POA: Diagnosis not present

## 2015-12-04 MED ORDER — TIZANIDINE HCL 2 MG PO TABS
2.0000 mg | ORAL_TABLET | Freq: Three times a day (TID) | ORAL | Status: DC | PRN
Start: 2015-12-04 — End: 2015-12-28

## 2015-12-04 NOTE — Progress Notes (Signed)
Name: Paul Spears   MRN: [id number]    DOB: 01-Oct-1945   Date:12/04/2015       Progress Note  Subjective  Chief Complaint  Chief Complaint  Patient presents with  . Neck Pain    Neck Pain  This is a new problem. The current episode started more than 1 month ago (approx 3 months ago). The problem has been unchanged. The pain is associated with a sleep position (Woke up from sleep and noticed his neck was stiff, cannot  recall any injury but sometimes his son gives him a massage.). The pain is present in the left side. The quality of the pain is described as aching. The pain is at a severity of 4/10. The symptoms are aggravated by bending and position. Pertinent negatives include no fever, headaches, photophobia, trouble swallowing or visual change. He has tried NSAIDs and acetaminophen for the symptoms.    Past Medical History  Diagnosis Date  . Hypertension   . Hyperlipidemia     Past Surgical History  Procedure Laterality Date  . Carpal tunnel release Right     Family History  Problem Relation Age of Onset  . Hypertension Father   . Heart attack Father   . Heart disease Sister   . Hypertension Sister   . Hyperlipidemia Sister     Social History   Social History  . Marital Status: Divorced    Spouse Name: N/A  . Number of Children: N/A  . Years of Education: N/A   Occupational History  . Not on file.   Social History Main Topics  . Smoking status: Never Smoker   . Smokeless tobacco: Never Used  . Alcohol Use: No  . Drug Use: No  . Sexual Activity: Not on file   Other Topics Concern  . Not on file   Social History Narrative     Current outpatient prescriptions:  .  ACCU-CHEK SOFTCLIX LANCETS lancets, , Disp: , Rfl:  .  atorvastatin (LIPITOR) 20 MG tablet, Take 1 tablet (20 mg total) by mouth daily., Disp: 90 tablet, Rfl: 0 .  esomeprazole (NEXIUM) 20 MG capsule, Take 20 mg by mouth daily at 12 noon. , Disp: , Rfl:  .  glimepiride (AMARYL) 4 MG tablet,  Take 1 tablet (4 mg total) by mouth daily with breakfast., Disp: 90 tablet, Rfl: 0 .  glucose blood (ACCU-CHEK AVIVA PLUS) test strip, Use as instructed, Disp: 100 each, Rfl: 12 .  losartan-hydrochlorothiazide (HYZAAR) 50-12.5 MG per tablet, Take 1 tablet by mouth daily., Disp: 30 tablet, Rfl: 2 .  metFORMIN (GLUCOPHAGE) 1000 MG tablet, Take 1 tablet (1,000 mg total) by mouth 2 (two) times daily with a meal., Disp: 180 tablet, Rfl: 0 .  Vitamin D, Ergocalciferol, (DRISDOL) 50000 UNITS CAPS capsule, Take 50,000 Units by mouth every 7 (seven) days., Disp: , Rfl:   No Known Allergies   Review of Systems  Constitutional: Negative for fever and chills.  HENT: Negative for trouble swallowing.   Eyes: Negative for photophobia.  Musculoskeletal: Positive for neck pain.  Neurological: Negative for headaches.     Objective  Filed Vitals:   12/04/15 1321  BP: 118/78  Pulse: 114  Temp: 98.4 F (36.9 C)  TempSrc: Oral  Resp: 18  Height: 5\' 11"  (1.803 m)  Weight: 215 lb 9.6 oz (97.796 kg)  SpO2: 97%    Physical Exam  Constitutional: He is well-developed, well-nourished, and in no distress.  HENT:  Head: Normocephalic and atraumatic.  Musculoskeletal:  Cervical back: He exhibits tenderness and pain.       Back:  Tightness around the left posterior neck and palpable muscle spasm. Pain worse with rotation of neck to the left side. No tenderness   Nursing note and vitals reviewed.      Assessment & Plan  1. Muscle pain, cervical Suspect muscle stiffness around the cervical spine area, obtain x-rays to rule out osteoarthritis. Started on muscle relaxant therapy. - tiZANidine (ZANAFLEX) 2 MG tablet; Take 1 tablet (2 mg total) by mouth every 8 (eight) hours as needed for muscle spasms.  Dispense: 30 tablet; Refill: 0 - DG Cervical Spine Complete; Future   Paul Spears Paul [facility] 12/04/2015 1:22 PM

## 2015-12-07 ENCOUNTER — Other Ambulatory Visit: Payer: Self-pay | Admitting: Family Medicine

## 2015-12-07 DIAGNOSIS — M5030 Other cervical disc degeneration, unspecified cervical region: Secondary | ICD-10-CM | POA: Insufficient documentation

## 2015-12-22 ENCOUNTER — Ambulatory Visit: Payer: Medicare Other | Admitting: Family Medicine

## 2015-12-28 ENCOUNTER — Other Ambulatory Visit: Payer: Self-pay | Admitting: Family Medicine

## 2016-01-01 DIAGNOSIS — I10 Essential (primary) hypertension: Secondary | ICD-10-CM | POA: Diagnosis not present

## 2016-01-01 DIAGNOSIS — M4722 Other spondylosis with radiculopathy, cervical region: Secondary | ICD-10-CM | POA: Diagnosis not present

## 2016-01-04 ENCOUNTER — Ambulatory Visit (INDEPENDENT_AMBULATORY_CARE_PROVIDER_SITE_OTHER): Payer: Medicare Other | Admitting: Family Medicine

## 2016-01-04 ENCOUNTER — Encounter: Payer: Self-pay | Admitting: Family Medicine

## 2016-01-04 ENCOUNTER — Other Ambulatory Visit: Payer: Self-pay | Admitting: Family Medicine

## 2016-01-04 DIAGNOSIS — E1122 Type 2 diabetes mellitus with diabetic chronic kidney disease: Secondary | ICD-10-CM

## 2016-01-04 DIAGNOSIS — I10 Essential (primary) hypertension: Secondary | ICD-10-CM | POA: Diagnosis not present

## 2016-01-04 DIAGNOSIS — E785 Hyperlipidemia, unspecified: Secondary | ICD-10-CM

## 2016-01-04 DIAGNOSIS — N183 Chronic kidney disease, stage 3 (moderate): Secondary | ICD-10-CM

## 2016-01-04 DIAGNOSIS — IMO0002 Reserved for concepts with insufficient information to code with codable children: Secondary | ICD-10-CM

## 2016-01-04 DIAGNOSIS — E1165 Type 2 diabetes mellitus with hyperglycemia: Secondary | ICD-10-CM | POA: Diagnosis not present

## 2016-01-04 LAB — GLUCOSE, POCT (MANUAL RESULT ENTRY): POC GLUCOSE: 164 mg/dL — AB (ref 70–99)

## 2016-01-04 LAB — POCT GLYCOSYLATED HEMOGLOBIN (HGB A1C): HEMOGLOBIN A1C: 7.7

## 2016-01-04 MED ORDER — SITAGLIPTIN PHOSPHATE 100 MG PO TABS: 100.0000 mg | ORAL_TABLET | Freq: Every day | ORAL | Status: DC

## 2016-01-04 NOTE — Progress Notes (Signed)
Name: Paul Spears   MRN: [id number]    DOB: [birth year]   Date:01/04/2016       Progress Note  Subjective  Chief Complaint  Chief Complaint  Patient presents with  . Diabetes    3 month follow up  . Hypertension  . Hyperlipidemia    Diabetes He presents for his follow-up diabetic visit. He has type 2 diabetes mellitus. His disease course has been worsening. Pertinent negatives for hypoglycemia include no headaches. (Has dropped too low when he didn't eat and felt dizzy, lightheaded,) Pertinent negatives for diabetes include no blurred vision, no chest pain, no fatigue, no polydipsia and no polyuria. Pertinent negatives for diabetic complications include no CVA. Current diabetic treatment includes oral agent (dual therapy). He is following a generally healthy diet. His breakfast blood glucose range is generally 140-180 mg/dl. An ACE inhibitor/angiotensin II receptor blocker is being taken.  Hypertension This is a chronic problem. The problem is unchanged. The problem is controlled. Pertinent negatives include no blurred vision, chest pain, headaches, palpitations or shortness of breath. Past treatments include diuretics and ACE inhibitors. There is no history of kidney disease, CAD/MI or CVA.  Hyperlipidemia This is a chronic problem. The problem is controlled. Recent lipid tests were reviewed and are normal. Exacerbating diseases include diabetes. Pertinent negatives include no chest pain, leg pain, myalgias or shortness of breath. Current antihyperlipidemic treatment includes statins.    Past Medical History  Diagnosis Date  . Hypertension   . Hyperlipidemia     Past Surgical History  Procedure Laterality Date  . Carpal tunnel release Right     Family History  Problem Relation Age of Onset  . Hypertension Father   . Heart attack Father   . Heart disease Sister   . Hypertension Sister   . Hyperlipidemia Sister     Social History   Social History  . Marital Status:  Divorced    Spouse Name: N/A  . Number of Children: N/A  . Years of Education: N/A   Occupational History  . Not on file.   Social History Main Topics  . Smoking status: Never Smoker   . Smokeless tobacco: Never Used  . Alcohol Use: No  . Drug Use: No  . Sexual Activity: Not on file   Other Topics Concern  . Not on file   Social History Narrative     Current outpatient prescriptions:  .  ACCU-CHEK SOFTCLIX LANCETS lancets, , Disp: , Rfl:  .  atorvastatin (LIPITOR) 20 MG tablet, Take 1 tablet (20 mg total) by mouth daily., Disp: 90 tablet, Rfl: 0 .  Blood Glucose Monitoring Suppl (ACCU-CHEK AVIVA) device, USE AS DIRECTED, Disp: 100 each, Rfl: 0 .  esomeprazole (NEXIUM) 20 MG capsule, Take 20 mg by mouth daily at 12 noon. , Disp: , Rfl:  .  glimepiride (AMARYL) 4 MG tablet, TAKE ONE TABLET BY MOUTH ONCE DAILY WITH  BREAKFAST, Disp: 90 tablet, Rfl: 0 .  losartan-hydrochlorothiazide (HYZAAR) 50-12.5 MG per tablet, Take 1 tablet by mouth daily., Disp: 30 tablet, Rfl: 2 .  metFORMIN (GLUCOPHAGE) 1000 MG tablet, TAKE ONE TABLET BY MOUTH TWICE DAILY WITH A MEAL, Disp: 180 tablet, Rfl: 0 .  tiZANidine (ZANAFLEX) 2 MG tablet, TAKE ONE TABLET BY MOUTH EVERY 8 HOURS AS NEEDED FOR MUSCLE SPASM, Disp: 30 tablet, Rfl: 0 .  Vitamin D, Ergocalciferol, (DRISDOL) 50000 UNITS CAPS capsule, Take 50,000 Units by mouth every 7 (seven) days., Disp: , Rfl:   No Known Allergies  Review of Systems  Constitutional: Negative for fatigue.  Eyes: Negative for blurred vision.  Respiratory: Negative for shortness of breath.   Cardiovascular: Negative for chest pain and palpitations.  Musculoskeletal: Negative for myalgias.  Neurological: Negative for headaches.  Endo/Heme/Allergies: Negative for polydipsia.    Objective  Filed Vitals:   01/04/16 0951  BP: 122/70  Pulse: 102  Temp: 97.6 F (36.4 C)  TempSrc: Oral  Resp: 18  Height: 5\' 11"  (1.803 m)  Weight: 214 lb 9.6 oz (97.342 kg)  SpO2:  98%    Physical Exam  Constitutional: He is oriented to person, place, and time and well-developed, well-nourished, and in no distress.  HENT:  Head: Normocephalic and atraumatic.  Cardiovascular: Normal rate, regular rhythm and normal heart sounds.   No murmur heard. Pulmonary/Chest: Effort normal and breath sounds normal. He has no wheezes.  Abdominal: Soft. Bowel sounds are normal.  Musculoskeletal: He exhibits no edema or tenderness.  Neurological: He is alert and oriented to person, place, and time.  Psychiatric: Mood, memory, affect and judgment normal.  Nursing note and vitals reviewed.    Recent Results (from the past 2160 hour(s))  POCT Glucose (CBG)     Status: Abnormal   Collection Time: 01/04/16  9:56 AM  Result Value Ref Range   POC Glucose 164 (A) 70 - 99 mg/dl  POCT HgB A1C     Status: None   Collection Time: 01/04/16  9:57 AM  Result Value Ref Range   Hemoglobin A1C 7.7      Assessment & Plan  1. Uncontrolled type 2 diabetes mellitus with stage 3 chronic kidney disease, without long-term current use of insulin (HCC) DC glimepiride because of poorly controlled diabetes with some symptoms of hypoglycemia. We will start on Januvia 100 mg daily, follow-up in 3 months. - POCT HgB A1C - POCT Glucose (CBG) - sitaGLIPtin (JANUVIA) 100 MG tablet; Take 1 tablet (100 mg total) by mouth daily.  Dispense: 90 tablet; Refill: 0  2. Hyperlipidemia FLP at goal, continue on present statin therapy  3. Essential hypertension BP at goal. Continue on antihypertensive therapy    Mckala Pantaleon Asad [facility] 01/04/2016 10:04 AM

## 2016-01-08 ENCOUNTER — Other Ambulatory Visit: Payer: Self-pay | Admitting: Family Medicine

## 2016-01-08 DIAGNOSIS — IMO0002 Reserved for concepts with insufficient information to code with codable children: Secondary | ICD-10-CM

## 2016-01-08 DIAGNOSIS — E1165 Type 2 diabetes mellitus with hyperglycemia: Secondary | ICD-10-CM

## 2016-02-07 ENCOUNTER — Other Ambulatory Visit: Payer: Self-pay | Admitting: Family Medicine

## 2016-03-08 DIAGNOSIS — I129 Hypertensive chronic kidney disease with stage 1 through stage 4 chronic kidney disease, or unspecified chronic kidney disease: Secondary | ICD-10-CM | POA: Diagnosis not present

## 2016-03-08 DIAGNOSIS — N183 Chronic kidney disease, stage 3 (moderate): Secondary | ICD-10-CM | POA: Diagnosis not present

## 2016-03-08 DIAGNOSIS — E1122 Type 2 diabetes mellitus with diabetic chronic kidney disease: Secondary | ICD-10-CM | POA: Diagnosis not present

## 2016-03-08 DIAGNOSIS — E875 Hyperkalemia: Secondary | ICD-10-CM | POA: Diagnosis not present

## 2016-03-20 ENCOUNTER — Other Ambulatory Visit: Payer: Self-pay | Admitting: Family Medicine

## 2016-03-20 DIAGNOSIS — E785 Hyperlipidemia, unspecified: Secondary | ICD-10-CM

## 2016-03-28 ENCOUNTER — Encounter: Payer: Self-pay | Admitting: Family Medicine

## 2016-03-28 ENCOUNTER — Ambulatory Visit (INDEPENDENT_AMBULATORY_CARE_PROVIDER_SITE_OTHER): Payer: Medicare Other | Admitting: Family Medicine

## 2016-03-28 VITALS — BP 140/80 | HR 110 | Temp 98.3°F | Resp 16 | Ht 71.0 in | Wt 210.2 lb

## 2016-03-28 DIAGNOSIS — Z0000 Encounter for general adult medical examination without abnormal findings: Secondary | ICD-10-CM | POA: Diagnosis not present

## 2016-03-28 DIAGNOSIS — IMO0002 Reserved for concepts with insufficient information to code with codable children: Secondary | ICD-10-CM

## 2016-03-28 DIAGNOSIS — E559 Vitamin D deficiency, unspecified: Secondary | ICD-10-CM | POA: Diagnosis not present

## 2016-03-28 DIAGNOSIS — E785 Hyperlipidemia, unspecified: Secondary | ICD-10-CM | POA: Diagnosis not present

## 2016-03-28 DIAGNOSIS — Z125 Encounter for screening for malignant neoplasm of prostate: Secondary | ICD-10-CM | POA: Diagnosis not present

## 2016-03-28 DIAGNOSIS — I10 Essential (primary) hypertension: Secondary | ICD-10-CM | POA: Diagnosis not present

## 2016-03-28 DIAGNOSIS — E1165 Type 2 diabetes mellitus with hyperglycemia: Secondary | ICD-10-CM

## 2016-03-28 DIAGNOSIS — N183 Chronic kidney disease, stage 3 (moderate): Secondary | ICD-10-CM

## 2016-03-28 DIAGNOSIS — E1122 Type 2 diabetes mellitus with diabetic chronic kidney disease: Secondary | ICD-10-CM

## 2016-03-28 LAB — CBC WITH DIFFERENTIAL/PLATELET
BASOS ABS: 56 {cells}/uL (ref 0–200)
Basophils Relative: 1 %
EOS ABS: 336 {cells}/uL (ref 15–500)
Eosinophils Relative: 6 %
HEMATOCRIT: 34.3 % — AB (ref 38.5–50.0)
HEMOGLOBIN: 11.6 g/dL — AB (ref 13.2–17.1)
LYMPHS ABS: 1736 {cells}/uL (ref 850–3900)
Lymphocytes Relative: 31 %
MCH: 26.9 pg — AB (ref 27.0–33.0)
MCHC: 33.8 g/dL (ref 32.0–36.0)
MCV: 79.6 fL — AB (ref 80.0–100.0)
MONO ABS: 616 {cells}/uL (ref 200–950)
MPV: 9 fL (ref 7.5–12.5)
Monocytes Relative: 11 %
NEUTROS ABS: 2856 {cells}/uL (ref 1500–7800)
NEUTROS PCT: 51 %
Platelets: 369 10*3/uL (ref 140–400)
RBC: 4.31 MIL/uL (ref 4.20–5.80)
RDW: 14.9 % (ref 11.0–15.0)
WBC: 5.6 10*3/uL (ref 3.8–10.8)

## 2016-03-28 LAB — TSH: TSH: 1.41 m[IU]/L (ref 0.40–4.50)

## 2016-03-28 LAB — PSA: PSA: 1.4 ng/mL (ref <=4.0)

## 2016-03-28 MED ORDER — GLIMEPIRIDE 4 MG PO TABS: 4.0000 mg | ORAL_TABLET | Freq: Every day | ORAL | 0 refills | Status: DC

## 2016-03-28 MED ORDER — ATORVASTATIN CALCIUM 20 MG PO TABS: 20.0000 mg | ORAL_TABLET | Freq: Every day | ORAL | 0 refills | Status: DC

## 2016-03-28 MED ORDER — METFORMIN HCL 1000 MG PO TABS: 1000.0000 mg | ORAL_TABLET | Freq: Two times a day (BID) | ORAL | 0 refills | Status: DC

## 2016-03-28 MED ORDER — GLUCOSE BLOOD VI STRP: ORAL_STRIP | 12 refills | Status: DC

## 2016-03-28 NOTE — Progress Notes (Signed)
Name: Paul Spears   MRN: [id number]    DOB: Dec 27, 1945   Date:03/28/2016       Progress Note  Subjective  Chief Complaint  Chief Complaint  Patient presents with  . Annual Exam    HPI  Pt. presents for Annual Wellness Visit.    Subjective:   DETERRIUS Spears is a 70 y.o. male who presents for Medicare Annual/Subsequent preventive examination.  Review of Systems:         Objective:    Vitals: BP 140/80 (BP Location: Left Arm, Patient Position: Sitting, Cuff Size: Large)   Pulse (!) 110   Temp 98.3 F (36.8 C) (Oral)   Resp 16   Ht 5\' 11"  (1.803 m)   Wt 210 lb 3.2 oz (95.3 kg)   SpO2 97%   BMI 29.32 kg/m   Body mass index is 29.32 kg/m.  Tobacco History  Smoking Status  . Never Smoker  Smokeless Tobacco  . Never Used     Counseling given: Not Answered   Past Medical History:  Diagnosis Date  . Diabetes mellitus without complication ([place])   . Hyperlipidemia   . Hypertension   . Kidney disease, chronic, stage III (GFR 30-59 ml/min)    Past Surgical History:  Procedure Laterality Date  . CARPAL TUNNEL RELEASE Right    Family History  Problem Relation Age of Onset  . Hypertension Father   . Heart attack Father   . Heart disease Sister   . Hypertension Sister   . Hyperlipidemia Sister    History  Sexual Activity  . Sexual activity: Not on file    Outpatient Encounter Prescriptions as of 03/28/2016  Medication Sig  . ACCU-CHEK SOFTCLIX LANCETS lancets   . atorvastatin (LIPITOR) 20 MG tablet TAKE ONE TABLET BY MOUTH ONCE DAILY  . Blood Glucose Monitoring Suppl (ACCU-CHEK AVIVA) device USE AS DIRECTED  . Blood Glucose Monitoring Suppl (ACCU-CHEK AVIVA) device USE AS DIRECTED  . esomeprazole (NEXIUM) 20 MG capsule Take 20 mg by mouth daily at 12 noon.   Marland Kitchen losartan-hydrochlorothiazide (HYZAAR) 50-12.5 MG per tablet Take 1 tablet by mouth daily.  . metFORMIN (GLUCOPHAGE) 1000 MG tablet TAKE ONE TABLET BY MOUTH TWICE DAILY WITH MEALS  . Vitamin D,  Ergocalciferol, (DRISDOL) 50000 UNITS CAPS capsule Take 50,000 Units by mouth every 7 (seven) days.  . sitaGLIPtin (JANUVIA) 100 MG tablet Take 1 tablet (100 mg total) by mouth daily. (Patient not taking: Reported on 03/28/2016)  . tiZANidine (ZANAFLEX) 2 MG tablet TAKE ONE TABLET BY MOUTH EVERY 8 HOURS AS NEEDED FOR MUSCLE SPASM (Patient not taking: Reported on 03/28/2016)   No facility-administered encounter medications on file as of 03/28/2016.     Activities of Daily Living In your present state of health, do you have any difficulty performing the following activities: 01/04/2016 12/04/2015  Hearing? Tempie Donning  Vision? N N  Difficulty concentrating or making decisions? N N  Walking or climbing stairs? N N  Dressing or bathing? N N  Doing errands, shopping? N N  Some recent data might be hidden    Patient Care Team: Roselee Nova, MD as PCP - General (Family Medicine)   Assessment:     Exercise Activities and Dietary recommendations    Goals    None     Fall Risk Fall Risk  01/04/2016 12/04/2015 09/25/2015 06/23/2015 03/22/2015  Falls in the past year? No No No No No   Depression Screen [facility] 2/9 Scores 01/04/2016 12/04/2015 09/25/2015 06/23/2015  PHQ - 2 Score 0 0 0 0    Cognitive Testing No flowsheet data found.  Immunization History  Administered Date(s) Administered  . Influenza-Unspecified 02/18/2013, 03/18/2015  . Pneumococcal Polysaccharide-23 08/03/2012   Screening Tests Health Maintenance  Topic Date Due  . Hepatitis C Screening  04-28-1946  . OPHTHALMOLOGY EXAM  01/10/1956  . TETANUS/TDAP  01/09/1965  . COLONOSCOPY  01/10/1996  . ZOSTAVAX  01/09/2006  . PNA vac Low Risk Adult (2 of 2 - PCV13) 08/03/2013  . INFLUENZA VACCINE  01/16/2016  . FOOT EXAM  03/21/2016  . HEMOGLOBIN A1C  07/06/2016      Plan:    During the course of the visit the patient was educated and counseled about the following appropriate screening and preventive services:   Vaccines to  include Pneumoccal, Influenza, Hepatitis B, Td, Zostavax, HCV  Electrocardiogram  Cardiovascular Disease  Colorectal cancer screening  Diabetes screening  Prostate Cancer Screening  Glaucoma screening  Nutrition counseling   Smoking cessation counseling  Patient Instructions (the written plan) was given to the patient.    Keith Rake, MD  03/28/2016     Past Medical History:  Diagnosis Date  . Diabetes mellitus without complication ([place])   . Hyperlipidemia   . Hypertension   . Kidney disease, chronic, stage III (GFR 30-59 ml/min)     Past Surgical History:  Procedure Laterality Date  . CARPAL TUNNEL RELEASE Right     Family History  Problem Relation Age of Onset  . Hypertension Father   . Heart attack Father   . Heart disease Sister   . Hypertension Sister   . Hyperlipidemia Sister     Social History   Social History  . Marital status: Divorced    Spouse name: N/A  . Number of children: N/A  . Years of education: N/A   Occupational History  . Not on file.   Social History Main Topics  . Smoking status: Never Smoker  . Smokeless tobacco: Never Used  . Alcohol use No  . Drug use: No  . Sexual activity: Not on file   Other Topics Concern  . Not on file   Social History Narrative  . No narrative on file     Current Outpatient Prescriptions:  .  ACCU-CHEK SOFTCLIX LANCETS lancets, , Disp: , Rfl:  .  atorvastatin (LIPITOR) 20 MG tablet, TAKE ONE TABLET BY MOUTH ONCE DAILY, Disp: 90 tablet, Rfl: 0 .  Blood Glucose Monitoring Suppl (ACCU-CHEK AVIVA) device, USE AS DIRECTED, Disp: 100 each, Rfl: 0 .  Blood Glucose Monitoring Suppl (ACCU-CHEK AVIVA) device, USE AS DIRECTED, Disp: 100 each, Rfl: 0 .  esomeprazole (NEXIUM) 20 MG capsule, Take 20 mg by mouth daily at 12 noon. , Disp: , Rfl:  .  losartan-hydrochlorothiazide (HYZAAR) 50-12.5 MG per tablet, Take 1 tablet by mouth daily., Disp: 30 tablet, Rfl: 2 .  metFORMIN (GLUCOPHAGE) 1000 MG tablet,  TAKE ONE TABLET BY MOUTH TWICE DAILY WITH MEALS, Disp: 180 tablet, Rfl: 0 .  Vitamin D, Ergocalciferol, (DRISDOL) 50000 UNITS CAPS capsule, Take 50,000 Units by mouth every 7 (seven) days., Disp: , Rfl:  .  sitaGLIPtin (JANUVIA) 100 MG tablet, Take 1 tablet (100 mg total) by mouth daily. (Patient not taking: Reported on 03/28/2016), Disp: 90 tablet, Rfl: 0 .  tiZANidine (ZANAFLEX) 2 MG tablet, TAKE ONE TABLET BY MOUTH EVERY 8 HOURS AS NEEDED FOR MUSCLE SPASM (Patient not taking: Reported on 03/28/2016), Disp: 90 tablet, Rfl: 1  No Known Allergies  Review of Systems  Constitutional: Positive for malaise/fatigue. Negative for chills and fever.  HENT: Negative for sore throat.   Eyes: Negative for blurred vision (trouble seeing close up).  Respiratory: Negative for cough, sputum production and shortness of breath.   Cardiovascular: Negative for chest pain, palpitations and leg swelling.  Gastrointestinal: Negative for abdominal pain, blood in stool, melena, nausea and vomiting.  Genitourinary: Negative for dysuria and hematuria.  Musculoskeletal: Positive for back pain and neck pain.  Skin: Negative for rash.  Neurological: Negative for headaches.  Psychiatric/Behavioral: Negative for depression. The patient is not nervous/anxious.       Objective  Vitals:   03/28/16 0909  BP: 140/80  Pulse: (!) 110  Resp: 16  Temp: 98.3 F (36.8 C)  TempSrc: Oral  SpO2: 97%  Weight: 210 lb 3.2 oz (95.3 kg)  Height: 5\' 11"  (1.803 m)    Physical Exam  Constitutional: He is oriented to person, place, and time and well-developed, well-nourished, and in no distress.  HENT:  Head: Normocephalic and atraumatic.  Right Ear: Tympanic membrane and ear canal normal. Decreased hearing is noted.  Left Ear: Tympanic membrane and ear canal normal. Decreased hearing is noted.  Cardiovascular: Normal rate, regular rhythm, S1 normal, S2 normal and normal heart sounds.   No murmur heard. Pulmonary/Chest:  Effort normal and breath sounds normal. He has no wheezes. He has no rhonchi.  Abdominal: Soft. Bowel sounds are normal. There is no tenderness.  Genitourinary: Prostate normal.  Neurological: He is alert and oriented to person, place, and time.  Psychiatric: Mood, memory, affect and judgment normal.  Nursing note and vitals reviewed.     Assessment & Plan  1. Medicare annual wellness visit, subsequent Up to date on colonoscopy, obtain screening labs - PSA - Vitamin D (25 hydroxy) - TSH - CBC with Differential  2. Hyperlipidemia, unspecified hyperlipidemia type  - atorvastatin (LIPITOR) 20 MG tablet; Take 1 tablet (20 mg total) by mouth daily.  Dispense: 90 tablet; Refill: 0  3. Uncontrolled type 2 diabetes mellitus with stage 3 chronic kidney disease, without long-term current use of insulin ([place]) Patient felt that Januvia was not helping and so he discontinued and went back to taking glimepiride, refills provided. Will return in 2 weeks for lab work - metFORMIN (GLUCOPHAGE) 1000 MG tablet; Take 1 tablet (1,000 mg total) by mouth 2 (two) times daily with a meal.  Dispense: 180 tablet; Refill: 0 - glimepiride (AMARYL) 4 MG tablet; Take 1 tablet (4 mg total) by mouth daily with breakfast.  Dispense: 90 tablet; Refill: 0 - glucose blood (ACCU-CHEK AVIVA PLUS) test strip; Use as instructed  Dispense: 100 each; Refill: 12   Magdalina Whitehead Asad A. [facility] 03/28/2016 9:27 AM

## 2016-03-29 LAB — VITAMIN D 25 HYDROXY (VIT D DEFICIENCY, FRACTURES): Vit D, 25-Hydroxy: 24 ng/mL — ABNORMAL LOW (ref 30–100)

## 2016-04-03 ENCOUNTER — Telehealth: Payer: Self-pay

## 2016-04-03 DIAGNOSIS — Z23 Encounter for immunization: Secondary | ICD-10-CM | POA: Diagnosis not present

## 2016-04-03 MED ORDER — VITAMIN D (ERGOCALCIFEROL) 1.25 MG (50000 UNIT) PO CAPS: 50000.0000 [IU] | ORAL_CAPSULE | ORAL | 0 refills | Status: DC

## 2016-04-03 NOTE — Telephone Encounter (Signed)
Patient has been notified of lab results and a prescription for vitamin D3 50,000 units take 1 capsule once a week for 12 weeks has been sent to [facility] per Dr. Manuella Ghazi, patient has been notified and verbalized understanding

## 2016-04-05 ENCOUNTER — Ambulatory Visit: Payer: Medicare Other | Admitting: Family Medicine

## 2016-04-08 ENCOUNTER — Ambulatory Visit (INDEPENDENT_AMBULATORY_CARE_PROVIDER_SITE_OTHER): Payer: Medicare Other | Admitting: Family Medicine

## 2016-04-08 VITALS — BP 120/72 | HR 101 | Temp 98.0°F | Resp 16 | Ht 71.0 in | Wt 211.3 lb

## 2016-04-08 DIAGNOSIS — E1122 Type 2 diabetes mellitus with diabetic chronic kidney disease: Secondary | ICD-10-CM

## 2016-04-08 DIAGNOSIS — I10 Essential (primary) hypertension: Secondary | ICD-10-CM

## 2016-04-08 DIAGNOSIS — E785 Hyperlipidemia, unspecified: Secondary | ICD-10-CM

## 2016-04-08 DIAGNOSIS — Z23 Encounter for immunization: Secondary | ICD-10-CM | POA: Diagnosis not present

## 2016-04-08 DIAGNOSIS — Z794 Long term (current) use of insulin: Secondary | ICD-10-CM

## 2016-04-08 DIAGNOSIS — N183 Chronic kidney disease, stage 3 unspecified: Secondary | ICD-10-CM

## 2016-04-08 LAB — POCT GLYCOSYLATED HEMOGLOBIN (HGB A1C): HEMOGLOBIN A1C: 7.4

## 2016-04-08 NOTE — Progress Notes (Signed)
Name: Paul Spears   MRN: [id number]    DOB: 03/28/1946   Date:04/09/2016       Progress Note  Subjective  Chief Complaint  Chief Complaint  Patient presents with  . Diabetes    follow up  . Hyperlipidemia  . Hypertension   Diabetes  He presents for his follow-up diabetic visit. He has type 2 diabetes mellitus. His disease course has been improving. Pertinent negatives for hypoglycemia include no headaches. Pertinent negatives for diabetes include no blurred vision, no chest pain, no fatigue, no polydipsia, no polyuria and no weight loss. Pertinent negatives for diabetic complications include no CVA. Current diabetic treatment includes oral agent (dual therapy). He is following a generally healthy diet. His breakfast blood glucose range is generally 140-180 mg/dl. An ACE inhibitor/angiotensin II receptor blocker is being taken.  Hyperlipidemia  This is a chronic problem. The problem is controlled. Recent lipid tests were reviewed and are normal. Exacerbating diseases include diabetes. Pertinent negatives include no chest pain, leg pain, myalgias or shortness of breath. Current antihyperlipidemic treatment includes statins.  Hypertension  This is a chronic problem. The problem is unchanged. The problem is controlled. Associated symptoms include palpitations (described as heart beating faster and pumping harder, usually happens at night). Pertinent negatives include no blurred vision, chest pain, headaches or shortness of breath. Past treatments include diuretics and angiotensin blockers. There is no history of kidney disease, CAD/MI or CVA.    Past Medical History:  Diagnosis Date  . Diabetes mellitus without complication ([place])   . Hyperlipidemia   . Hypertension   . Kidney disease, chronic, stage III (GFR 30-59 ml/min)     Past Surgical History:  Procedure Laterality Date  . CARPAL TUNNEL RELEASE Right     Family History  Problem Relation Age of Onset  . Hypertension Father   .  Heart attack Father   . Heart disease Sister   . Hypertension Sister   . Hyperlipidemia Sister     Social History   Social History  . Marital status: Divorced    Spouse name: N/A  . Number of children: N/A  . Years of education: N/A   Occupational History  . Not on file.   Social History Main Topics  . Smoking status: Never Smoker  . Smokeless tobacco: Never Used  . Alcohol use No  . Drug use: No  . Sexual activity: Not on file   Other Topics Concern  . Not on file   Social History Narrative  . No narrative on file     Current Outpatient Prescriptions:  .  ACCU-CHEK SOFTCLIX LANCETS lancets, , Disp: , Rfl:  .  atorvastatin (LIPITOR) 20 MG tablet, Take 1 tablet (20 mg total) by mouth daily., Disp: 90 tablet, Rfl: 0 .  Blood Glucose Monitoring Suppl (ACCU-CHEK AVIVA) device, USE AS DIRECTED, Disp: 100 each, Rfl: 0 .  Blood Glucose Monitoring Suppl (ACCU-CHEK AVIVA) device, USE AS DIRECTED, Disp: 100 each, Rfl: 0 .  esomeprazole (NEXIUM) 20 MG capsule, Take 20 mg by mouth daily at 12 noon. , Disp: , Rfl:  .  glimepiride (AMARYL) 4 MG tablet, Take 1 tablet (4 mg total) by mouth daily with breakfast., Disp: 90 tablet, Rfl: 0 .  glucose blood (ACCU-CHEK AVIVA PLUS) test strip, Use as instructed, Disp: 100 each, Rfl: 12 .  losartan-hydrochlorothiazide (HYZAAR) 50-12.5 MG per tablet, Take 1 tablet by mouth daily., Disp: 30 tablet, Rfl: 2 .  metFORMIN (GLUCOPHAGE) 1000 MG tablet, Take 1 tablet (1,000  mg total) by mouth 2 (two) times daily with a meal., Disp: 180 tablet, Rfl: 0 .  tiZANidine (ZANAFLEX) 2 MG tablet, TAKE ONE TABLET BY MOUTH EVERY 8 HOURS AS NEEDED FOR MUSCLE SPASM, Disp: 90 tablet, Rfl: 1 .  Vitamin D, Ergocalciferol, (DRISDOL) 50000 units CAPS capsule, Take 1 capsule (50,000 Units total) by mouth once a week. For 12 weeks., Disp: 12 capsule, Rfl: 0  No Known Allergies   Review of Systems  Constitutional: Negative for chills, fatigue, fever and weight loss.   Eyes: Negative for blurred vision.  Respiratory: Negative for shortness of breath.   Cardiovascular: Positive for palpitations (described as heart beating faster and pumping harder, usually happens at night). Negative for chest pain.  Musculoskeletal: Negative for myalgias.  Neurological: Negative for headaches.  Endo/Heme/Allergies: Negative for polydipsia.    Objective  Vitals:   04/09/16 0837  BP: 120/72  Pulse: (!) 101  Resp: 16  Temp: 98 F (36.7 C)  TempSrc: Oral  SpO2: 98%  Weight: 211 lb 4.8 oz (95.8 kg)  Height: 5\' 11"  (1.803 m)    Physical Exam  Constitutional: He is oriented to person, place, and time and well-developed, well-nourished, and in no distress.  HENT:  Head: Normocephalic and atraumatic.  Cardiovascular: Normal rate, regular rhythm and normal heart sounds.   No murmur heard. Pulmonary/Chest: Effort normal and breath sounds normal. He has no wheezes.  Abdominal: Soft. Bowel sounds are normal. There is no tenderness.  Neurological: He is alert and oriented to person, place, and time.  Psychiatric: Mood, memory, affect and judgment normal.  Nursing note and vitals reviewed.     Assessment & Plan  1. Type 2 diabetes mellitus with stage 3 chronic kidney disease, with long-term current use of insulin (HCC) Point-of-care A1c 7.4%, improved from 7.7%, no change in pharmacotherapy - POCT HgB A1C  2. Essential hypertension BP stable and controlled on present anti- hypertensive therapy  3. Hyperlipidemia, unspecified hyperlipidemia type FLP at goal - Lipid Profile - COMPLETE METABOLIC PANEL WITH GFR  4. Need for Streptococcus pneumoniae vaccination  - Pneumococcal conjugate vaccine 13-valent   Rad Gramling Asad A. [facility] 04/09/2016 8:40 AM

## 2016-04-09 ENCOUNTER — Encounter: Payer: Self-pay | Admitting: Family Medicine

## 2016-04-09 LAB — LIPID PANEL
CHOLESTEROL: 156 mg/dL (ref 125–200)
HDL: 42 mg/dL (ref >=40)
LDL Cholesterol: 91 mg/dL (ref <130)
TRIGLYCERIDES: 117 mg/dL (ref <150)
Total CHOL/HDL Ratio: 3.7 Ratio (ref <=5.0)
VLDL: 23 mg/dL (ref <30)

## 2016-04-09 LAB — COMPLETE METABOLIC PANEL WITH GFR
ALT: 15 U/L (ref 9–46)
AST: 17 U/L (ref 10–35)
Albumin: 4.4 g/dL (ref 3.6–5.1)
Alkaline Phosphatase: 109 U/L (ref 40–115)
BILIRUBIN TOTAL: 0.7 mg/dL (ref 0.2–1.2)
BUN: 15 mg/dL (ref 7–25)
CALCIUM: 9.8 mg/dL (ref 8.6–10.3)
CHLORIDE: 103 mmol/L (ref 98–110)
CO2: 25 mmol/L (ref 20–31)
CREATININE: 1.41 mg/dL — AB (ref 0.70–1.18)
GFR, EST AFRICAN AMERICAN: 58 mL/min — AB (ref >=60)
GFR, EST NON AFRICAN AMERICAN: 50 mL/min — AB (ref >=60)
Glucose, Bld: 157 mg/dL — ABNORMAL HIGH (ref 65–99)
Potassium: 5.5 mmol/L — ABNORMAL HIGH (ref 3.5–5.3)
Sodium: 138 mmol/L (ref 135–146)
Total Protein: 7 g/dL (ref 6.1–8.1)

## 2016-05-03 ENCOUNTER — Telehealth: Payer: Self-pay | Admitting: Family Medicine

## 2016-05-03 NOTE — Telephone Encounter (Signed)
Pt needing results of labs please

## 2016-05-30 DIAGNOSIS — L218 Other seborrheic dermatitis: Secondary | ICD-10-CM | POA: Diagnosis not present

## 2016-06-15 ENCOUNTER — Other Ambulatory Visit: Payer: Self-pay | Admitting: Family Medicine

## 2016-06-15 DIAGNOSIS — E1165 Type 2 diabetes mellitus with hyperglycemia: Principal | ICD-10-CM

## 2016-06-15 DIAGNOSIS — E1122 Type 2 diabetes mellitus with diabetic chronic kidney disease: Secondary | ICD-10-CM

## 2016-06-15 DIAGNOSIS — IMO0002 Reserved for concepts with insufficient information to code with codable children: Secondary | ICD-10-CM

## 2016-06-15 DIAGNOSIS — N183 Chronic kidney disease, stage 3 (moderate): Principal | ICD-10-CM

## 2016-06-19 NOTE — Telephone Encounter (Signed)
Medication has been refilled and sent to [facility] [address] 

## 2016-07-03 ENCOUNTER — Ambulatory Visit: Payer: Medicare Other | Admitting: Family Medicine

## 2016-07-05 ENCOUNTER — Ambulatory Visit: Payer: Medicare Other | Admitting: Family Medicine

## 2016-07-09 DIAGNOSIS — K2270 Barrett's esophagus without dysplasia: Secondary | ICD-10-CM | POA: Diagnosis not present

## 2016-07-09 DIAGNOSIS — E875 Hyperkalemia: Secondary | ICD-10-CM | POA: Diagnosis not present

## 2016-07-09 DIAGNOSIS — K3189 Other diseases of stomach and duodenum: Secondary | ICD-10-CM | POA: Diagnosis not present

## 2016-07-17 ENCOUNTER — Other Ambulatory Visit: Payer: Self-pay | Admitting: Family Medicine

## 2016-07-17 ENCOUNTER — Ambulatory Visit (INDEPENDENT_AMBULATORY_CARE_PROVIDER_SITE_OTHER): Payer: Medicare Other | Admitting: Family Medicine

## 2016-07-17 ENCOUNTER — Encounter: Payer: Self-pay | Admitting: Family Medicine

## 2016-07-17 VITALS — BP 122/67 | HR 104 | Temp 97.8°F | Resp 17 | Ht 71.0 in | Wt 205.3 lb

## 2016-07-17 DIAGNOSIS — E1122 Type 2 diabetes mellitus with diabetic chronic kidney disease: Secondary | ICD-10-CM | POA: Diagnosis not present

## 2016-07-17 DIAGNOSIS — E1165 Type 2 diabetes mellitus with hyperglycemia: Secondary | ICD-10-CM

## 2016-07-17 DIAGNOSIS — I10 Essential (primary) hypertension: Secondary | ICD-10-CM | POA: Diagnosis not present

## 2016-07-17 DIAGNOSIS — N183 Chronic kidney disease, stage 3 (moderate): Secondary | ICD-10-CM

## 2016-07-17 DIAGNOSIS — IMO0002 Reserved for concepts with insufficient information to code with codable children: Secondary | ICD-10-CM

## 2016-07-17 DIAGNOSIS — R002 Palpitations: Secondary | ICD-10-CM

## 2016-07-17 DIAGNOSIS — E785 Hyperlipidemia, unspecified: Secondary | ICD-10-CM | POA: Diagnosis not present

## 2016-07-17 LAB — POCT GLYCOSYLATED HEMOGLOBIN (HGB A1C): HEMOGLOBIN A1C: 7.5

## 2016-07-17 LAB — POCT CBG (FASTING - GLUCOSE)-MANUAL ENTRY: Glucose Fasting, POC: 146 mg/dL — AB (ref 70–99)

## 2016-07-17 MED ORDER — ATORVASTATIN CALCIUM 20 MG PO TABS: 20.0000 mg | ORAL_TABLET | Freq: Every day | ORAL | 0 refills | Status: DC

## 2016-07-17 MED ORDER — LOSARTAN POTASSIUM-HCTZ 50-12.5 MG PO TABS: 1.0000 | ORAL_TABLET | Freq: Every day | ORAL | 0 refills | Status: DC

## 2016-07-17 NOTE — Progress Notes (Signed)
Name: Paul Spears   MRN: [id number]    DOB: 08/03/1945   Date:07/17/2016       Progress Note  Subjective  Chief Complaint  Chief Complaint  Patient presents with  . Follow-up    3 mo  . Labs Only    Fasting     Diabetes  He presents for his follow-up diabetic visit. He has type 2 diabetes mellitus. His disease course has been improving. Pertinent negatives for hypoglycemia include no headaches. Pertinent negatives for diabetes include no blurred vision, no chest pain, no fatigue, no polydipsia, no polyuria and no weight loss. Pertinent negatives for diabetic complications include no CVA. Current diabetic treatment includes oral agent (dual therapy). He is following a generally unhealthy (eats outside mainly fast food) diet. His breakfast blood glucose range is generally 140-180 mg/dl. An ACE inhibitor/angiotensin II receptor blocker is being taken.  Hyperlipidemia  This is a chronic problem. The problem is controlled. Recent lipid tests were reviewed and are normal. Exacerbating diseases include diabetes. Pertinent negatives include no chest pain, leg pain, myalgias or shortness of breath. Current antihyperlipidemic treatment includes statins.  Hypertension  This is a chronic problem. The problem is unchanged. The problem is controlled. Associated symptoms include palpitations (described as heart beating faster and pumping harder, usually happens at night, cannot sleep becasue of the palpitations). Pertinent negatives include no blurred vision, chest pain, headaches or shortness of breath. Past treatments include diuretics and angiotensin blockers. There is no history of kidney disease, CAD/MI or CVA.    Past Medical History:  Diagnosis Date  . Diabetes mellitus without complication ([place])   . Hyperlipidemia   . Hypertension   . Kidney disease, chronic, stage III (GFR 30-59 ml/min)     Past Surgical History:  Procedure Laterality Date  . CARPAL TUNNEL RELEASE Right     Family  History  Problem Relation Age of Onset  . Hypertension Father   . Heart attack Father   . Heart disease Sister   . Hypertension Sister   . Hyperlipidemia Sister     Social History   Social History  . Marital status: Divorced    Spouse name: N/A  . Number of children: N/A  . Years of education: N/A   Occupational History  . Not on file.   Social History Main Topics  . Smoking status: Never Smoker  . Smokeless tobacco: Never Used  . Alcohol use No  . Drug use: No  . Sexual activity: Not on file   Other Topics Concern  . Not on file   Social History Narrative  . No narrative on file     Current Outpatient Prescriptions:  .  ACCU-CHEK SOFTCLIX LANCETS lancets, , Disp: , Rfl:  .  atorvastatin (LIPITOR) 20 MG tablet, Take 1 tablet (20 mg total) by mouth daily., Disp: 90 tablet, Rfl: 0 .  Blood Glucose Monitoring Suppl (ACCU-CHEK AVIVA) device, USE AS DIRECTED, Disp: 100 each, Rfl: 0 .  Blood Glucose Monitoring Suppl (ACCU-CHEK AVIVA) device, USE AS DIRECTED, Disp: 100 each, Rfl: 0 .  esomeprazole (NEXIUM) 20 MG capsule, Take 20 mg by mouth daily at 12 noon. , Disp: , Rfl:  .  glimepiride (AMARYL) 4 MG tablet, TAKE ONE TABLET BY MOUTH ONCE DAILY WITH BREAKFAST, Disp: 90 tablet, Rfl: 0 .  glucose blood (ACCU-CHEK AVIVA PLUS) test strip, Use as instructed, Disp: 100 each, Rfl: 12 .  losartan-hydrochlorothiazide (HYZAAR) 50-12.5 MG per tablet, Take 1 tablet by mouth daily., Disp: 30 tablet,  Rfl: 2 .  metFORMIN (GLUCOPHAGE) 1000 MG tablet, Take 1 tablet (1,000 mg total) by mouth 2 (two) times daily with a meal., Disp: 180 tablet, Rfl: 0 .  Vitamin D, Ergocalciferol, (DRISDOL) 50000 units CAPS capsule, Take 1 capsule (50,000 Units total) by mouth once a week. For 12 weeks., Disp: 12 capsule, Rfl: 0  No Known Allergies   Review of Systems  Constitutional: Negative for fatigue and weight loss.  Eyes: Negative for blurred vision.  Respiratory: Negative for shortness of breath.    Cardiovascular: Positive for palpitations (described as heart beating faster and pumping harder, usually happens at night, cannot sleep becasue of the palpitations). Negative for chest pain.  Musculoskeletal: Negative for myalgias.  Neurological: Negative for headaches.  Endo/Heme/Allergies: Negative for polydipsia.     Objective  Vitals:   07/17/16 1010  BP: 122/67  Pulse: (!) 104  Resp: 17  Temp: 97.8 F (36.6 C)  TempSrc: Oral  SpO2: 97%  Weight: 205 lb 4.8 oz (93.1 kg)  Height: 5\' 11"  (1.803 m)    Physical Exam  Constitutional: He is oriented to person, place, and time and well-developed, well-nourished, and in no distress.  HENT:  Head: Normocephalic and atraumatic.  Cardiovascular: Normal rate, regular rhythm and normal heart sounds.   No murmur heard. Pulmonary/Chest: Effort normal and breath sounds normal. He has no wheezes.  Abdominal: Soft. Bowel sounds are normal. There is no tenderness.  Musculoskeletal: He exhibits no edema.  Neurological: He is alert and oriented to person, place, and time.  Psychiatric: Mood, memory, affect and judgment normal.  Nursing note and vitals reviewed.      Assessment & Plan  1. Essential hypertension BP stable on present and hypertensive therapy - losartan-hydrochlorothiazide (HYZAAR) 50-12.5 MG tablet; Take 1 tablet by mouth daily.  Dispense: 90 tablet; Refill: 0  2. Uncontrolled type 2 diabetes mellitus with stage 3 chronic kidney disease, without long-term current use of insulin (HCC)  A1c 7.5%, still above goal of less than 7%, advised on dietary and lifestyle interventions which include avoiding fast food whenever possible, continue on pharmacotherapy as prescribed. - POCT CBG (Fasting - Glucose) - POCT glycosylated hemoglobin (Hb A1C)  3. Hyperlipidemia, unspecified hyperlipidemia type  - Lipid panel - COMPLETE METABOLIC PANEL WITH GFR - atorvastatin (LIPITOR) 20 MG tablet; Take 1 tablet (20 mg total) by mouth  daily.  Dispense: 90 tablet; Refill: 0  4. Heart palpitations Has history of palpitations, we'll refer to cardiology for management. - Ambulatory referral to Cardiology   [facility] 07/17/2016 10:34 AM

## 2016-07-18 LAB — COMPREHENSIVE METABOLIC PANEL
A/G RATIO: 1.7 (ref 1.2–2.2)
ALBUMIN: 4.7 g/dL (ref 3.5–4.8)
ALK PHOS: 130 IU/L — AB (ref 39–117)
ALT: 19 IU/L (ref 0–44)
AST: 18 IU/L (ref 0–40)
BILIRUBIN TOTAL: 0.4 mg/dL (ref 0.0–1.2)
BUN / CREAT RATIO: 17 (ref 10–24)
BUN: 23 mg/dL (ref 8–27)
CHLORIDE: 105 mmol/L (ref 96–106)
CO2: 23 mmol/L (ref 18–29)
CREATININE: 1.34 mg/dL — AB (ref 0.76–1.27)
Calcium: 9.7 mg/dL (ref 8.6–10.2)
GFR calc Af Amer: 62 mL/min/{1.73_m2} (ref >59)
GFR calc non Af Amer: 53 mL/min/{1.73_m2} — ABNORMAL LOW (ref >59)
GLOBULIN, TOTAL: 2.7 g/dL (ref 1.5–4.5)
Glucose: 156 mg/dL — ABNORMAL HIGH (ref 65–99)
POTASSIUM: 4.8 mmol/L (ref 3.5–5.2)
SODIUM: 140 mmol/L (ref 134–144)
Total Protein: 7.4 g/dL (ref 6.0–8.5)

## 2016-07-18 LAB — LIPID PANEL W/O CHOL/HDL RATIO
CHOLESTEROL TOTAL: 160 mg/dL (ref 100–199)
HDL: 39 mg/dL — ABNORMAL LOW (ref >39)
LDL CALC: 95 mg/dL (ref 0–99)
TRIGLYCERIDES: 129 mg/dL (ref 0–149)
VLDL Cholesterol Cal: 26 mg/dL (ref 5–40)

## 2016-07-18 LAB — AMBIG ABBREV LP DEFAULT

## 2016-07-18 LAB — AMBIG ABBREV CMP14 DEFAULT

## 2016-07-23 ENCOUNTER — Encounter: Payer: Self-pay | Admitting: Cardiology

## 2016-07-23 ENCOUNTER — Ambulatory Visit (INDEPENDENT_AMBULATORY_CARE_PROVIDER_SITE_OTHER): Payer: Medicare Other | Admitting: Cardiology

## 2016-07-23 VITALS — BP 148/82 | HR 80 | Ht 71.0 in | Wt 210.2 lb

## 2016-07-23 DIAGNOSIS — R002 Palpitations: Secondary | ICD-10-CM | POA: Diagnosis not present

## 2016-07-23 NOTE — Patient Instructions (Signed)
Testing/Procedures: Your physician has recommended that you wear a holter monitor. Holter monitors are medical devices that record the heart's electrical activity. Doctors most often use these monitors to diagnose arrhythmias. Arrhythmias are problems with the speed or rhythm of the heartbeat. The monitor is a small, portable device. You can wear one while you do your normal daily activities. This is usually used to diagnose what is causing palpitations/syncope (passing out).    Follow-Up: Your physician recommends that you schedule a follow-up appointment as needed with Dr. Yvone Neu. We will call you with results and if needed schedule follow up at that time.   It was a pleasure seeing you today here in the office. Please do not hesitate to give [place] a call back if you have any further questions. [place], BSN    Holter Monitoring Introduction A Holter monitor is a small device that is used to detect abnormal heart rhythms. It clips to your clothing and is connected by wires to flat, sticky disks (electrodes) that attach to your chest. It is worn continuously for 24-48 hours. Follow these instructions at home:  Wear your Holter monitor at all times, even while exercising and sleeping, for as long as directed by your health care provider.  Make sure that the Holter monitor is safely clipped to your clothing or close to your body as recommended by your health care provider.  Do not get the monitor or wires wet.  Do not put body lotion or moisturizer on your chest.  Keep your skin clean.  Keep a diary of your daily activities, such as walking and doing chores. If you feel that your heartbeat is abnormal or that your heart is fluttering or skipping a beat:  Record what you are doing when it happens.  Record what time of day the symptoms occur.  Return your Holter monitor as directed by your health care provider.  Keep all follow-up visits as directed by your health care  provider. This is important. Get help right away if:  You feel lightheaded or you faint.  You have trouble breathing.  You feel pain in your chest, upper arm, or jaw.  You feel sick to your stomach and your skin is pale, cool, or damp.  You heartbeat feels unusual or abnormal. This information is not intended to replace advice given to you by your health care provider. Make sure you discuss any questions you have with your health care provider. Document Released: 03/01/2004 Document Revised: 11/09/2015 Document Reviewed: 01/10/2014  2017 [employer]

## 2016-07-23 NOTE — Progress Notes (Signed)
Cardiology Office Note   Date:  07/23/2016   ID:  ADRIN BOURDON, DOB [birth year], MRN [id number]  Referring Doctor:  Keith Rake, MD   Cardiologist:   Wende Bushy, MD   Reason for consultation:  Chief Complaint  Patient presents with  . other    New patient. Referred by Manuella Ghazi for Palpitations and rapid heartbeat. Meds reviewed verbally with patient.       History of Present Illness: Paul Spears is a 71 y.o. male who presents for Episodes of palpitations.  Patient reports that this has been going on for many years now. He previously saw Dr. Clayborn Bigness. He didn't bring forth this info right away but on reviewing care everywhere, it looks like he just had stress test and Holter monitor last year. Patient reports though, that the palpitations have become somewhat different recently. He feels that his heart slows down at night. This happens every night, mild to moderate intensity, lasting a few seconds at a time. No passing out notes lightheadedness no dizziness.  Patient denies chest pain, shortness of breath, PND, orthopnea, edema.   ROS:  Please see the history of present illness. Aside from mentioned under HPI, all other systems are reviewed and negative.     Past Medical History:  Diagnosis Date  . Diabetes mellitus without complication ([place])   . Hyperlipidemia   . Hypertension   . Kidney disease, chronic, stage III (GFR 30-59 ml/min)     Past Surgical History:  Procedure Laterality Date  . CARPAL TUNNEL RELEASE Right      reports that he has never smoked. He has never used smokeless tobacco. He reports that he does not drink alcohol or use drugs.   family history includes Heart attack in his father; Heart disease in his sister; Hyperlipidemia in his sister; Hypertension in his father and sister.   Outpatient Medications Prior to Visit  Medication Sig Dispense Refill  . ACCU-CHEK SOFTCLIX LANCETS lancets     . atorvastatin (LIPITOR) 20 MG tablet Take 1 tablet (20  mg total) by mouth daily. 90 tablet 0  . Blood Glucose Monitoring Suppl (ACCU-CHEK AVIVA) device USE AS DIRECTED 100 each 0  . Blood Glucose Monitoring Suppl (ACCU-CHEK AVIVA) device USE AS DIRECTED 100 each 0  . esomeprazole (NEXIUM) 20 MG capsule Take 20 mg by mouth daily at 12 noon.     Marland Kitchen glimepiride (AMARYL) 4 MG tablet TAKE ONE TABLET BY MOUTH ONCE DAILY WITH BREAKFAST 90 tablet 0  . glucose blood (ACCU-CHEK AVIVA PLUS) test strip Use as instructed 100 each 12  . losartan-hydrochlorothiazide (HYZAAR) 50-12.5 MG tablet Take 1 tablet by mouth daily. 90 tablet 0  . metFORMIN (GLUCOPHAGE) 1000 MG tablet Take 1 tablet (1,000 mg total) by mouth 2 (two) times daily with a meal. 180 tablet 0  . Vitamin D, Ergocalciferol, (DRISDOL) 50000 units CAPS capsule Take 1 capsule (50,000 Units total) by mouth once a week. For 12 weeks. 12 capsule 0   No facility-administered medications prior to visit.      Allergies: Patient has no known allergies.    PHYSICAL EXAM: VS:  BP (!) 148/82 (BP Location: Left Arm, Patient Position: Sitting, Cuff Size: Normal)   Pulse 80   Ht 5\' 11"  (1.803 m)   Wt 210 lb 4 oz (95.4 kg)   BMI 29.32 kg/m  , Body mass index is 29.32 kg/m. Wt Readings from Last 3 Encounters:  07/23/16 210 lb 4 oz (95.4 kg)  07/17/16 205  lb 4.8 oz (93.1 kg)  04/09/16 211 lb 4.8 oz (95.8 kg)    GENERAL:  well developed, well nourished, not in acute distress HEENT: normocephalic, pink conjunctivae, anicteric sclerae, no xanthelasma, normal dentition, oropharynx clear NECK:  no neck vein engorgement, JVP normal, no hepatojugular reflux, carotid upstroke brisk and symmetric, no bruit, no thyromegaly, no lymphadenopathy LUNGS:  good respiratory effort, clear to auscultation bilaterally CV:  PMI not displaced, no thrills, no lifts, S1 and S2 within normal limits, no palpable S3 or S4, no murmurs, no rubs, no gallops ABD:  Soft, nontender, nondistended, normoactive bowel sounds, no abdominal  aortic bruit, no hepatomegaly, no splenomegaly MS: nontender back, no kyphosis, no scoliosis, no joint deformities EXT:  2+ DP/PT pulses, no edema, no varicosities, no cyanosis, no clubbing SKIN: warm, nondiaphoretic, normal turgor, no ulcers NEUROPSYCH: alert, oriented to person, place, and time, sensory/motor grossly intact, normal mood, appropriate affect  Recent Labs: 03/28/2016: Hemoglobin 11.6; Platelets 369; TSH 1.41 07/17/2016: ALT 19; BUN 23; Creatinine, Ser 1.34; Potassium 4.8; Sodium 140   Lipid Panel    Component Value Date/Time   CHOL 160 07/17/2016 1057   TRIG 129 07/17/2016 1057   HDL 39 (L) 07/17/2016 1057   CHOLHDL 3.7 04/08/2016 1204   VLDL 23 04/08/2016 1204   LDLCALC 95 07/17/2016 1057     Other studies Reviewed:  EKG:  The ekg from 07/23/2016 was personally reviewed by me and it revealed sinus rhythm, 80 BPM.  Additional studies/ records that were reviewed personally reviewed by me today include:  Echo 10/19/2015: NORMAL LEFT VENTRICULAR SYSTOLIC FUNCTION WITH MILD LVH MILD VALVULAR REGURGITATION (See above) NO VALVULAR STENOSIS EF >55%  Nuclear stress 10/16/2015:  Normal myocardial perfusion scan no evidence of stress-induced  myocardial ischemia ejection fraction of 65% conclusion negative scan   ASSESSMENT AND PLAN:  Palpitations Recommend 24-hour Holter monitor. Normal EF on echo from May 2017. No ischemia on nuclear stress is from May 2017.  Current medicines are reviewed at length with the patient today.  The patient does not have concerns regarding medicines.  Labs/ tests ordered today include: No orders of the defined types were placed in this encounter.   I had a lengthy and detailed discussion with the patient regarding diagnoses, prognosis, diagnostic options, treatment options , and side effects of medications.   I counseled the patient on importance of lifestyle modification including heart healthy diet, regular physical activity  .   Disposition:   FU with undersigned after tests prn  Thank you for this consultation. We will forwarding this consultation to referring physician.   I spent at least 45 minutes with the patient today and more than 50% of the time was spent counseling the patient and coordinating care.    Signed, Wende Bushy, MD  07/23/2016 11:24 AM    [place]  This note was generated in part with voice recognition software and I apologize for any typographical errors that were not detected and corrected.

## 2016-08-02 ENCOUNTER — Ambulatory Visit (INDEPENDENT_AMBULATORY_CARE_PROVIDER_SITE_OTHER): Payer: Medicare Other

## 2016-08-02 DIAGNOSIS — R002 Palpitations: Secondary | ICD-10-CM

## 2016-08-08 ENCOUNTER — Ambulatory Visit
Admission: RE | Admit: 2016-08-08 | Discharge: 2016-08-08 | Disposition: A | Discharge status: Home / Self Care | Payer: Medicare Other | Source: Ambulatory Visit | Attending: Cardiology | Admitting: Cardiology

## 2016-08-08 DIAGNOSIS — R002 Palpitations: Secondary | ICD-10-CM | POA: Diagnosis not present

## 2016-08-28 DIAGNOSIS — R809 Proteinuria, unspecified: Secondary | ICD-10-CM | POA: Diagnosis not present

## 2016-08-28 DIAGNOSIS — N183 Chronic kidney disease, stage 3 (moderate): Secondary | ICD-10-CM | POA: Diagnosis not present

## 2016-08-28 DIAGNOSIS — E1122 Type 2 diabetes mellitus with diabetic chronic kidney disease: Secondary | ICD-10-CM | POA: Diagnosis not present

## 2016-08-28 DIAGNOSIS — I129 Hypertensive chronic kidney disease with stage 1 through stage 4 chronic kidney disease, or unspecified chronic kidney disease: Secondary | ICD-10-CM | POA: Diagnosis not present

## 2016-08-28 DIAGNOSIS — E875 Hyperkalemia: Secondary | ICD-10-CM | POA: Diagnosis not present

## 2016-08-28 DIAGNOSIS — E785 Hyperlipidemia, unspecified: Secondary | ICD-10-CM | POA: Diagnosis not present

## 2016-10-04 ENCOUNTER — Other Ambulatory Visit: Payer: Self-pay | Admitting: Family Medicine

## 2016-10-04 DIAGNOSIS — IMO0002 Reserved for concepts with insufficient information to code with codable children: Secondary | ICD-10-CM

## 2016-10-04 DIAGNOSIS — E1165 Type 2 diabetes mellitus with hyperglycemia: Principal | ICD-10-CM

## 2016-10-04 DIAGNOSIS — E1122 Type 2 diabetes mellitus with diabetic chronic kidney disease: Secondary | ICD-10-CM

## 2016-10-04 DIAGNOSIS — N183 Chronic kidney disease, stage 3 (moderate): Principal | ICD-10-CM

## 2016-10-04 NOTE — Telephone Encounter (Signed)
Last GFR reviewed; Rx approved

## 2016-10-15 ENCOUNTER — Encounter: Payer: Self-pay | Admitting: Family Medicine

## 2016-10-15 ENCOUNTER — Ambulatory Visit (INDEPENDENT_AMBULATORY_CARE_PROVIDER_SITE_OTHER): Payer: Medicare Other | Admitting: Family Medicine

## 2016-10-15 ENCOUNTER — Other Ambulatory Visit: Payer: Self-pay | Admitting: Family Medicine

## 2016-10-15 VITALS — BP 140/68 | HR 109 | Temp 98.0°F | Resp 17 | Ht 71.0 in | Wt 214.5 lb

## 2016-10-15 DIAGNOSIS — E7800 Pure hypercholesterolemia, unspecified: Secondary | ICD-10-CM

## 2016-10-15 DIAGNOSIS — I10 Essential (primary) hypertension: Secondary | ICD-10-CM | POA: Diagnosis not present

## 2016-10-15 DIAGNOSIS — E1122 Type 2 diabetes mellitus with diabetic chronic kidney disease: Secondary | ICD-10-CM | POA: Diagnosis not present

## 2016-10-15 DIAGNOSIS — E1165 Type 2 diabetes mellitus with hyperglycemia: Secondary | ICD-10-CM | POA: Diagnosis not present

## 2016-10-15 DIAGNOSIS — N183 Chronic kidney disease, stage 3 (moderate): Secondary | ICD-10-CM

## 2016-10-15 DIAGNOSIS — IMO0002 Reserved for concepts with insufficient information to code with codable children: Secondary | ICD-10-CM

## 2016-10-15 LAB — POCT GLYCOSYLATED HEMOGLOBIN (HGB A1C): Hemoglobin A1C: 7.8

## 2016-10-15 LAB — GLUCOSE, POCT (MANUAL RESULT ENTRY): POC Glucose: 124 mg/dl — AB (ref 70–99)

## 2016-10-15 MED ORDER — LOSARTAN POTASSIUM-HCTZ 50-12.5 MG PO TABS: 1.0000 | ORAL_TABLET | Freq: Every day | ORAL | 0 refills | Status: DC

## 2016-10-15 MED ORDER — SITAGLIPTIN PHOSPHATE 50 MG PO TABS: 50.0000 mg | ORAL_TABLET | Freq: Every day | ORAL | 0 refills | Status: DC

## 2016-10-15 MED ORDER — ATORVASTATIN CALCIUM 20 MG PO TABS: 20.0000 mg | ORAL_TABLET | Freq: Every day | ORAL | 0 refills | Status: DC

## 2016-10-15 NOTE — Progress Notes (Signed)
Name: Paul Spears   MRN: [id number]    DOB: Aug 14, 1945   Date:10/15/2016       Progress Note  Subjective  Chief Complaint  Chief Complaint  Patient presents with  . Follow-up    3 mo  . Medication Refill    Diabetes  He presents for his follow-up diabetic visit. He has type 2 diabetes mellitus. His disease course has been worsening. There are no hypoglycemic associated symptoms. Pertinent negatives for hypoglycemia include no headaches. Pertinent negatives for diabetes include no blurred vision, no chest pain, no fatigue, no foot paresthesias, no polydipsia, no polyuria and no weight loss. Pertinent negatives for diabetic complications include no CVA. Current diabetic treatment includes oral agent (dual therapy). He is following a generally unhealthy (eats outside, does not cook in the house) diet. He monitors blood glucose at home 1-2 x per day. His breakfast blood glucose range is generally 140-180 mg/dl. An ACE inhibitor/angiotensin II receptor blocker is being taken.  Hyperlipidemia  This is a chronic problem. The problem is controlled. Recent lipid tests were reviewed and are normal. Exacerbating diseases include diabetes. Pertinent negatives include no chest pain, leg pain, myalgias or shortness of breath. Current antihyperlipidemic treatment includes statins.  Hypertension  This is a chronic problem. The problem is unchanged. The problem is controlled. Pertinent negatives include no blurred vision, chest pain, headaches, palpitations or shortness of breath. Past treatments include diuretics and angiotensin blockers. There is no history of kidney disease, CAD/MI or CVA.     Past Medical History:  Diagnosis Date  . Diabetes mellitus without complication ([place])   . Hyperlipidemia   . Hypertension   . Kidney disease, chronic, stage III (GFR 30-59 ml/min)     Past Surgical History:  Procedure Laterality Date  . CARPAL TUNNEL RELEASE Right     Family History  Problem Relation Age  of Onset  . Hypertension Father   . Heart attack Father   . Heart disease Sister   . Hypertension Sister   . Hyperlipidemia Sister     Social History   Social History  . Marital status: Divorced    Spouse name: N/A  . Number of children: N/A  . Years of education: N/A   Occupational History  . Not on file.   Social History Main Topics  . Smoking status: Never Smoker  . Smokeless tobacco: Never Used  . Alcohol use No  . Drug use: No  . Sexual activity: Not on file   Other Topics Concern  . Not on file   Social History Narrative  . No narrative on file     Current Outpatient Prescriptions:  .  ACCU-CHEK SOFTCLIX LANCETS lancets, , Disp: , Rfl:  .  atorvastatin (LIPITOR) 20 MG tablet, Take 1 tablet (20 mg total) by mouth daily., Disp: 90 tablet, Rfl: 0 .  Blood Glucose Monitoring Suppl (ACCU-CHEK AVIVA) device, USE AS DIRECTED, Disp: 100 each, Rfl: 0 .  Blood Glucose Monitoring Suppl (ACCU-CHEK AVIVA) device, USE AS DIRECTED, Disp: 100 each, Rfl: 0 .  esomeprazole (NEXIUM) 20 MG capsule, Take 20 mg by mouth daily at 12 noon. , Disp: , Rfl:  .  glimepiride (AMARYL) 4 MG tablet, TAKE ONE TABLET BY MOUTH ONCE DAILY WITH BREAKFAST, Disp: 90 tablet, Rfl: 0 .  glucose blood (ACCU-CHEK AVIVA PLUS) test strip, Use as instructed, Disp: 100 each, Rfl: 12 .  losartan-hydrochlorothiazide (HYZAAR) 50-12.5 MG tablet, Take 1 tablet by mouth daily., Disp: 90 tablet, Rfl: 0 .  metFORMIN (GLUCOPHAGE) 1000 MG tablet, TAKE ONE TABLET BY MOUTH TWICE DAILY WITH MEALS, Disp: 180 tablet, Rfl: 0  No Known Allergies   Review of Systems  Constitutional: Negative for fatigue and weight loss.  Eyes: Negative for blurred vision.  Respiratory: Negative for shortness of breath.   Cardiovascular: Negative for chest pain and palpitations.  Musculoskeletal: Negative for myalgias.  Neurological: Negative for headaches.  Endo/Heme/Allergies: Negative for polydipsia.   Objective  Vitals:    10/15/16 0837  BP: 140/68  Pulse: (!) 109  Resp: 17  Temp: 98 F (36.7 C)  TempSrc: Oral  SpO2: 96%  Weight: 214 lb 8 oz (97.3 kg)  Height: 5\' 11"  (1.803 m)    Physical Exam  Constitutional: He is oriented to person, place, and time and well-developed, well-nourished, and in no distress.  HENT:  Head: Normocephalic and atraumatic.  Cardiovascular: Normal rate, regular rhythm and normal heart sounds.   No murmur heard. Pulmonary/Chest: Effort normal and breath sounds normal. He has no wheezes.  Abdominal: Soft. Bowel sounds are normal. There is no tenderness.  Musculoskeletal: He exhibits no edema.  Neurological: He is alert and oriented to person, place, and time.  Psychiatric: Mood, memory, affect and judgment normal.  Nursing note and vitals reviewed.       Assessment & Plan  1. Uncontrolled type 2 diabetes mellitus with stage 3 chronic kidney disease, without long-term current use of insulin (HCC)  Point-of-care A1c 7.8%, worse because of dietary indiscretion, we'll add Januvia 50 mg to his regimen, - POCT HgB A1C - POCT Glucose (CBG) - sitaGLIPtin (JANUVIA) 50 MG tablet; Take 1 tablet (50 mg total) by mouth daily.  Dispense: 90 tablet; Refill: 0 - Urine Microalbumin w/creat. ratio  2. Essential hypertension Stable on present antihypertensive therapy - losartan-hydrochlorothiazide (HYZAAR) 50-12.5 MG tablet; Take 1 tablet by mouth daily.  Dispense: 90 tablet; Refill: 0  3. Pure hypercholesterolemia  - Lipid panel - atorvastatin (LIPITOR) 20 MG tablet; Take 1 tablet (20 mg total) by mouth daily.  Dispense: 90 tablet; Refill: 0     Rollan Roger [facility] 10/15/2016 8:45 AM

## 2016-10-16 LAB — LIPID PANEL W/O CHOL/HDL RATIO
CHOLESTEROL TOTAL: 172 mg/dL (ref 100–199)
HDL: 39 mg/dL — ABNORMAL LOW (ref >39)
LDL Calculated: 100 mg/dL — ABNORMAL HIGH (ref 0–99)
Triglycerides: 165 mg/dL — ABNORMAL HIGH (ref 0–149)
VLDL CHOLESTEROL CAL: 33 mg/dL (ref 5–40)

## 2016-10-16 LAB — MICROALBUMIN / CREATININE URINE RATIO
Creatinine, Urine: 235.6 mg/dL
MICROALB/CREAT RATIO: 5.4 mg/g{creat} (ref 0.0–30.0)
Microalbumin, Urine: 12.7 ug/mL

## 2016-10-22 ENCOUNTER — Ambulatory Visit: Payer: Medicare Other | Admitting: Anesthesiology

## 2016-10-22 ENCOUNTER — Encounter: Payer: Self-pay | Admitting: *Deleted

## 2016-10-22 ENCOUNTER — Encounter
Admission: RE | Disposition: A | Discharge status: Home / Self Care | Payer: Self-pay | Source: Ambulatory Visit | Attending: Gastroenterology

## 2016-10-22 ENCOUNTER — Ambulatory Visit
Admission: RE | Admit: 2016-10-22 | Discharge: 2016-10-22 | Disposition: A | Discharge status: Home / Self Care | Payer: Medicare Other | Source: Ambulatory Visit | Attending: Gastroenterology | Admitting: Gastroenterology

## 2016-10-22 DIAGNOSIS — Z7984 Long term (current) use of oral hypoglycemic drugs: Secondary | ICD-10-CM | POA: Insufficient documentation

## 2016-10-22 DIAGNOSIS — E785 Hyperlipidemia, unspecified: Secondary | ICD-10-CM | POA: Insufficient documentation

## 2016-10-22 DIAGNOSIS — K2270 Barrett's esophagus without dysplasia: Secondary | ICD-10-CM | POA: Insufficient documentation

## 2016-10-22 DIAGNOSIS — K2950 Unspecified chronic gastritis without bleeding: Secondary | ICD-10-CM | POA: Diagnosis not present

## 2016-10-22 DIAGNOSIS — N183 Chronic kidney disease, stage 3 (moderate): Secondary | ICD-10-CM | POA: Insufficient documentation

## 2016-10-22 DIAGNOSIS — I129 Hypertensive chronic kidney disease with stage 1 through stage 4 chronic kidney disease, or unspecified chronic kidney disease: Secondary | ICD-10-CM | POA: Diagnosis not present

## 2016-10-22 DIAGNOSIS — K2900 Acute gastritis without bleeding: Secondary | ICD-10-CM | POA: Diagnosis not present

## 2016-10-22 DIAGNOSIS — K259 Gastric ulcer, unspecified as acute or chronic, without hemorrhage or perforation: Secondary | ICD-10-CM | POA: Diagnosis not present

## 2016-10-22 DIAGNOSIS — Z79899 Other long term (current) drug therapy: Secondary | ICD-10-CM | POA: Diagnosis not present

## 2016-10-22 DIAGNOSIS — E1122 Type 2 diabetes mellitus with diabetic chronic kidney disease: Secondary | ICD-10-CM | POA: Diagnosis not present

## 2016-10-22 DIAGNOSIS — Z8719 Personal history of other diseases of the digestive system: Secondary | ICD-10-CM | POA: Diagnosis not present

## 2016-10-22 DIAGNOSIS — N189 Chronic kidney disease, unspecified: Secondary | ICD-10-CM | POA: Diagnosis not present

## 2016-10-22 DIAGNOSIS — Z13810 Encounter for screening for upper gastrointestinal disorder: Secondary | ICD-10-CM | POA: Insufficient documentation

## 2016-10-22 DIAGNOSIS — K319 Disease of stomach and duodenum, unspecified: Secondary | ICD-10-CM | POA: Insufficient documentation

## 2016-10-22 DIAGNOSIS — K2960 Other gastritis without bleeding: Secondary | ICD-10-CM | POA: Diagnosis not present

## 2016-10-22 DIAGNOSIS — K2970 Gastritis, unspecified, without bleeding: Secondary | ICD-10-CM | POA: Diagnosis not present

## 2016-10-22 DIAGNOSIS — K228 Other specified diseases of esophagus: Secondary | ICD-10-CM | POA: Diagnosis not present

## 2016-10-22 DIAGNOSIS — K219 Gastro-esophageal reflux disease without esophagitis: Secondary | ICD-10-CM | POA: Insufficient documentation

## 2016-10-22 HISTORY — PX: ESOPHAGOGASTRODUODENOSCOPY (EGD) WITH PROPOFOL: SHX5813

## 2016-10-22 LAB — GLUCOSE, CAPILLARY: Glucose-Capillary: 124 mg/dL — ABNORMAL HIGH (ref 65–99)

## 2016-10-22 SURGERY — ESOPHAGOGASTRODUODENOSCOPY (EGD) WITH PROPOFOL
Anesthesia: General

## 2016-10-22 MED ORDER — PROPOFOL 500 MG/50ML IV EMUL
INTRAVENOUS | Status: AC
  Filled 2016-10-22: qty 50

## 2016-10-22 MED ORDER — LIDOCAINE 2% (20 MG/ML) 5 ML SYRINGE
INTRAMUSCULAR | Status: DC | PRN
  Administered 2016-10-22: 40 mg via INTRAVENOUS

## 2016-10-22 MED ORDER — SODIUM CHLORIDE 0.9 % IV SOLN
INTRAVENOUS | Status: DC
  Administered 2016-10-22: 1000 mL via INTRAVENOUS

## 2016-10-22 MED ORDER — PROPOFOL 10 MG/ML IV BOLUS
INTRAVENOUS | Status: DC | PRN
  Administered 2016-10-22: 100 mg via INTRAVENOUS

## 2016-10-22 MED ORDER — PHENYLEPHRINE HCL 10 MG/ML IJ SOLN
INTRAMUSCULAR | Status: DC | PRN
  Administered 2016-10-22 (×3): 100 ug via INTRAVENOUS

## 2016-10-22 MED ORDER — SODIUM CHLORIDE 0.9 % IV SOLN: INTRAVENOUS | Status: DC

## 2016-10-22 MED ORDER — PROPOFOL 500 MG/50ML IV EMUL
INTRAVENOUS | Status: DC | PRN
  Administered 2016-10-22: 140 ug/kg/min via INTRAVENOUS

## 2016-10-22 MED ORDER — MIDAZOLAM HCL 2 MG/2ML IJ SOLN
INTRAMUSCULAR | Status: AC
  Filled 2016-10-22: qty 2

## 2016-10-22 MED ORDER — MIDAZOLAM HCL 5 MG/5ML IJ SOLN
INTRAMUSCULAR | Status: DC | PRN
  Administered 2016-10-22: 1 mg via INTRAVENOUS

## 2016-10-22 MED ORDER — FENTANYL CITRATE (PF) 100 MCG/2ML IJ SOLN
INTRAMUSCULAR | Status: AC
  Filled 2016-10-22: qty 2

## 2016-10-22 MED ORDER — FENTANYL CITRATE (PF) 100 MCG/2ML IJ SOLN
INTRAMUSCULAR | Status: DC | PRN
  Administered 2016-10-22: 50 ug via INTRAVENOUS

## 2016-10-22 NOTE — Anesthesia Preprocedure Evaluation (Signed)
Anesthesia Evaluation  Patient identified by MRN, date of birth, ID band Patient awake    Reviewed: Allergy & Precautions, H&P , NPO status , Patient's Chart, lab work & pertinent test results  History of Anesthesia Complications Negative for: history of anesthetic complications  Airway Mallampati: III  TM Distance: <3 FB Neck ROM: limited    Dental  (+) Poor Dentition, Chipped, Caps   Pulmonary neg pulmonary ROS, neg shortness of breath,    Pulmonary exam normal breath sounds clear to auscultation       Cardiovascular Exercise Tolerance: Good hypertension, (-) angina(-) Past MI and (-) DOE Normal cardiovascular exam Rhythm:regular Rate:Normal     Neuro/Psych negative neurological ROS  negative psych ROS   GI/Hepatic Neg liver ROS, GERD  Medicated and Controlled,  Endo/Other  diabetes, Type 2  Renal/GU CRFRenal disease  negative genitourinary   Musculoskeletal  (+) Arthritis ,   Abdominal   Peds  Hematology negative hematology ROS (+)   Anesthesia Other Findings Past Medical History: No date: Diabetes mellitus without complication (HCC) No date: Hyperlipidemia No date: Hypertension No date: Kidney disease, chronic, stage III (GFR 30-59 *  Past Surgical History: No date: CARPAL TUNNEL RELEASE Right No date: FRACTURE SURGERY  BMI    Body Mass Index:  29.57 kg/m      Reproductive/Obstetrics negative OB ROS                             Anesthesia Physical Anesthesia Plan  ASA: III  Anesthesia Plan: General   Post-op Pain Management:    Induction: Intravenous  Airway Management Planned: Natural Airway and Nasal Cannula  Additional Equipment:   Intra-op Plan:   Post-operative Plan:   Informed Consent: I have reviewed the patients History and Physical, chart, labs and discussed the procedure including the risks, benefits and alternatives for the proposed anesthesia with  the patient or authorized representative who has indicated his/her understanding and acceptance.   Dental Advisory Given  Plan Discussed with: Anesthesiologist, CRNA and Surgeon  Anesthesia Plan Comments: (Patient consented for risks of anesthesia including but not limited to:  - adverse reactions to medications - damage to teeth, lips or other oral mucosa - sore throat or hoarseness - Damage to heart, brain, lungs or loss of life  Patient voiced understanding.)        Anesthesia Quick Evaluation

## 2016-10-22 NOTE — H&P (Signed)
Outpatient short stay form Pre-procedure 10/22/2016 1:25 PM Lollie Sails MD  Primary Physician: Dr Keith Rake  Reason for visit:  EGD  History of present illness:  Patient is a 71 year old male presenting today as above. He has a personal history of Barrett's esophagus which was not in evidence on his last set of biopsies several years ago. However he also has a history of a antral nodule which was ultrasound and subsequently removed consistent with hyperplastic/prolapsed type polyp. He states that since that removal he has not had any difficulty with epigastric discomfort. He has no issues with reflux but he does take over-the-counter Nexium on a regular basis. He denies use of any aspirin products or blood thinning agents.    Current Facility-Administered Medications:  .  0.9 %  sodium chloride infusion, , Intravenous, Continuous, Lollie Sails, MD, Last Rate: 20 mL/hr at 10/22/16 1303, 1,000 mL at 10/22/16 1303 .  0.9 %  sodium chloride infusion, , Intravenous, Continuous, Lollie Sails, MD  Prescriptions Prior to Admission  Medication Sig Dispense Refill Last Dose  . ACCU-CHEK SOFTCLIX LANCETS lancets    10/21/2016 at Unknown time  . atorvastatin (LIPITOR) 20 MG tablet Take 1 tablet (20 mg total) by mouth daily. 90 tablet 0 10/21/2016 at Unknown time  . Blood Glucose Monitoring Suppl (ACCU-CHEK AVIVA) device USE AS DIRECTED 100 each 0 10/21/2016 at Unknown time  . Blood Glucose Monitoring Suppl (ACCU-CHEK AVIVA) device USE AS DIRECTED 100 each 0 10/21/2016 at Unknown time  . esomeprazole (NEXIUM) 20 MG capsule Take 20 mg by mouth daily at 12 noon.    10/21/2016 at Unknown time  . glimepiride (AMARYL) 4 MG tablet TAKE ONE TABLET BY MOUTH ONCE DAILY WITH BREAKFAST 90 tablet 0 10/21/2016 at Unknown time  . glucose blood (ACCU-CHEK AVIVA PLUS) test strip Use as instructed 100 each 12 10/21/2016 at Unknown time  . losartan-hydrochlorothiazide (HYZAAR) 50-12.5 MG tablet Take 1 tablet by mouth  daily. 90 tablet 0 10/21/2016 at Unknown time  . metFORMIN (GLUCOPHAGE) 1000 MG tablet TAKE ONE TABLET BY MOUTH TWICE DAILY WITH MEALS 180 tablet 0 10/21/2016 at Unknown time  . sitaGLIPtin (JANUVIA) 50 MG tablet Take 1 tablet (50 mg total) by mouth daily. 90 tablet 0 10/21/2016 at Unknown time     No Known Allergies   Past Medical History:  Diagnosis Date  . Diabetes mellitus without complication ([place])   . Hyperlipidemia   . Hypertension   . Kidney disease, chronic, stage III (GFR 30-59 ml/min)     Review of systems:      Physical Exam    Heart and lungs: Regular rate and rhythm without rub or gallop, lungs are bilaterally clear.    HEENT: Normocephalic atraumatic eyes are anicteric    Other:     Pertinant exam for procedure: Soft nontender nondistended bowel sounds positive normoactive.    Planned proceedures: EGD and indicated procedures. I have discussed the risks benefits and complications of procedures to include not limited to bleeding, infection, perforation and the risk of sedation and the patient wishes to proceed.    Lollie Sails, MD Gastroenterology 10/22/2016  1:25 PM

## 2016-10-22 NOTE — Anesthesia Postprocedure Evaluation (Signed)
Anesthesia Post Note  Patient: SATURNINO LIEW  Procedure(s) Performed: Procedure(s) (LRB): ESOPHAGOGASTRODUODENOSCOPY (EGD) WITH PROPOFOL (N/A)  Patient location during evaluation: Endoscopy Anesthesia Type: General Level of consciousness: awake and alert Pain management: pain level controlled Vital Signs Assessment: post-procedure vital signs reviewed and stable Respiratory status: spontaneous breathing, nonlabored ventilation, respiratory function stable and patient connected to nasal cannula oxygen Cardiovascular status: blood pressure returned to baseline and stable Postop Assessment: no signs of nausea or vomiting Anesthetic complications: no     Last Vitals:  Vitals:   10/22/16 1415 10/22/16 1425  BP: 91/60 131/73  Pulse: 82 79  Resp: 15 15  Temp:      Last Pain:  Vitals:   10/22/16 1405  TempSrc: Tympanic                 Precious Haws Piscitello

## 2016-10-22 NOTE — Transfer of Care (Signed)
Immediate Anesthesia Transfer of Care Note  Patient: Paul Spears  Procedure(s) Performed: Procedure(s): ESOPHAGOGASTRODUODENOSCOPY (EGD) WITH PROPOFOL (N/A)  Patient Location: PACU and Endoscopy Unit  Anesthesia Type:General  Level of Consciousness: sedated  Airway & Oxygen Therapy: Patient Spontanous Breathing and Patient connected to nasal cannula oxygen  Post-op Assessment: Report given to RN and Post -op Vital signs reviewed and stable  Post vital signs: Reviewed and stable  Last Vitals:  Vitals:   10/22/16 1247  BP: 134/84  Pulse: 90  Resp: 20  Temp: 36.9 C    Last Pain: There were no vitals filed for this visit.    Patients Stated Pain Goal: 7 (80/99/83 3825)  Complications: No apparent anesthesia complications

## 2016-10-22 NOTE — Op Note (Signed)
[facility] Gastroenterology Patient Name: Paul Spears Procedure Date: 10/22/2016 1:27 PM MRN: [id number] Account #: [id number] Date of Birth: [birth year] Admit Type: Outpatient Age: 71 Room: [facility] ENDO ROOM 3 Gender: Male Note Status: Finalized Procedure:            Upper GI endoscopy Indications:          Surveillance procedure, Follow-up of Barrett's esophagus Providers:            Lollie Sails, MD Referring MD:         Otila Back. Manuella Ghazi (Referring MD) Medicines:            Monitored Anesthesia Care Complications:        No immediate complications. Procedure:            Pre-Anesthesia Assessment:                       - ASA Grade Assessment: III - A patient with severe                        systemic disease.                       After obtaining informed consent, the endoscope was                        passed under direct vision. Throughout the procedure,                        the patient's blood pressure, pulse, and oxygen                        saturations were monitored continuously. The Endoscope                        was introduced through the mouth, and advanced to the                        third part of duodenum. The upper GI endoscopy was                        accomplished without difficulty. The patient tolerated                        the procedure well. Findings:      The Z-line was irregular.      There were esophageal mucosal changes suggestive of short-segment       Barrett's esophagus present at the gastroesophageal junction. The       maximum longitudinal extent of these mucosal changes was 1 cm in length.       Mucosa was biopsied with a cold forceps for histology in 4 quadrants at       the gastroesophageal junction. One specimen bottle was sent to pathology.      The exam of the esophagus was otherwise normal.      A single localized, 2 mm non-bleeding erosion was found at the incisura.       There were no stigmata of recent bleeding.  Biopsies were taken with a       cold forceps for histology.      no evidence of recurrence of previously removed prepyloric polyp.  The cardia and gastric fundus were normal on retroflexion.      The examined duodenum was normal. Impression:           - Z-line irregular.                       - Esophageal mucosal changes suggestive of                        short-segment Barrett's esophagus. Biopsied.                       - Non-bleeding erosive gastropathy. Biopsied.                       - Normal examined duodenum. Recommendation:       - Advance diet as tolerated.                       - Await pathology results.                       - Use Nexium (esomeprazole) 40 mg PO daily daily. Procedure Code(s):    --- Professional ---                       [id number], Esophagogastroduodenoscopy, flexible, transoral;                        with biopsy, single or multiple Diagnosis Code(s):    --- Professional ---                       K22.8, Other specified diseases of esophagus                       K22.70, Barrett's esophagus without dysplasia                       K31.89, Other diseases of stomach and duodenum CPT copyright 2016 [employer]. All rights reserved. The codes documented in this report are preliminary and upon coder review may  be revised to meet current compliance requirements. Lollie Sails, MD 10/22/2016 2:03:57 PM This report has been signed electronically. Number of Addenda: 0 Note Initiated On: 10/22/2016 1:27 PM      [facility]

## 2016-10-22 NOTE — Anesthesia Post-op Follow-up Note (Cosign Needed)
Anesthesia QCDR form completed.        

## 2016-10-23 ENCOUNTER — Telehealth: Payer: Self-pay | Admitting: Family Medicine

## 2016-10-23 DIAGNOSIS — M79645 Pain in left finger(s): Secondary | ICD-10-CM | POA: Insufficient documentation

## 2016-10-23 NOTE — Telephone Encounter (Signed)
Pt wants to know if he can be referred to someone about his hand to possibly get a cortisone shot? Pt also needs lab results.

## 2016-10-23 NOTE — Telephone Encounter (Signed)
Patient has pain in his left index finger, has difficulty closing the finger because of pressure and intense pain, will refer to orthopedics for management. He is aware of lab work and medication changes.

## 2016-10-25 LAB — SURGICAL PATHOLOGY

## 2016-10-29 ENCOUNTER — Telehealth: Payer: Self-pay | Admitting: Family Medicine

## 2016-10-29 ENCOUNTER — Other Ambulatory Visit: Payer: Self-pay | Admitting: Family Medicine

## 2016-10-29 ENCOUNTER — Encounter: Payer: Self-pay | Admitting: Gastroenterology

## 2016-10-29 DIAGNOSIS — M65322 Trigger finger, left index finger: Secondary | ICD-10-CM | POA: Diagnosis not present

## 2016-10-29 NOTE — Telephone Encounter (Signed)
Requesting return call for test results

## 2016-10-30 NOTE — Telephone Encounter (Signed)
Returned call and patient has been notified of lab results

## 2016-11-26 DIAGNOSIS — K2270 Barrett's esophagus without dysplasia: Secondary | ICD-10-CM | POA: Diagnosis not present

## 2016-11-26 DIAGNOSIS — R198 Other specified symptoms and signs involving the digestive system and abdomen: Secondary | ICD-10-CM | POA: Diagnosis not present

## 2016-11-26 DIAGNOSIS — R1013 Epigastric pain: Secondary | ICD-10-CM | POA: Diagnosis not present

## 2017-01-05 ENCOUNTER — Other Ambulatory Visit: Payer: Self-pay | Admitting: Family Medicine

## 2017-01-05 DIAGNOSIS — IMO0002 Reserved for concepts with insufficient information to code with codable children: Secondary | ICD-10-CM

## 2017-01-05 DIAGNOSIS — N183 Chronic kidney disease, stage 3 (moderate): Principal | ICD-10-CM

## 2017-01-05 DIAGNOSIS — E1165 Type 2 diabetes mellitus with hyperglycemia: Principal | ICD-10-CM

## 2017-01-05 DIAGNOSIS — E1122 Type 2 diabetes mellitus with diabetic chronic kidney disease: Secondary | ICD-10-CM

## 2017-01-15 ENCOUNTER — Ambulatory Visit (INDEPENDENT_AMBULATORY_CARE_PROVIDER_SITE_OTHER): Payer: Medicare Other | Admitting: Family Medicine

## 2017-01-15 ENCOUNTER — Encounter: Payer: Self-pay | Admitting: Family Medicine

## 2017-01-15 VITALS — BP 137/78 | HR 108 | Temp 98.3°F | Resp 17 | Ht 71.0 in | Wt 211.3 lb

## 2017-01-15 DIAGNOSIS — I10 Essential (primary) hypertension: Secondary | ICD-10-CM

## 2017-01-15 DIAGNOSIS — IMO0002 Reserved for concepts with insufficient information to code with codable children: Secondary | ICD-10-CM

## 2017-01-15 DIAGNOSIS — E1165 Type 2 diabetes mellitus with hyperglycemia: Secondary | ICD-10-CM | POA: Diagnosis not present

## 2017-01-15 DIAGNOSIS — E7800 Pure hypercholesterolemia, unspecified: Secondary | ICD-10-CM | POA: Diagnosis not present

## 2017-01-15 DIAGNOSIS — E1122 Type 2 diabetes mellitus with diabetic chronic kidney disease: Secondary | ICD-10-CM

## 2017-01-15 DIAGNOSIS — N183 Chronic kidney disease, stage 3 (moderate): Secondary | ICD-10-CM | POA: Diagnosis not present

## 2017-01-15 LAB — GLUCOSE, POCT (MANUAL RESULT ENTRY): POC Glucose: 146 mg/dl — AB (ref 70–99)

## 2017-01-15 MED ORDER — LOSARTAN POTASSIUM-HCTZ 50-12.5 MG PO TABS: 1.0000 | ORAL_TABLET | Freq: Every day | ORAL | 0 refills | Status: DC

## 2017-01-15 MED ORDER — METFORMIN HCL 1000 MG PO TABS: 1000.0000 mg | ORAL_TABLET | Freq: Two times a day (BID) | ORAL | 0 refills | Status: DC

## 2017-01-15 MED ORDER — SITAGLIPTIN PHOSPHATE 50 MG PO TABS: 50.0000 mg | ORAL_TABLET | Freq: Every day | ORAL | 0 refills | Status: DC

## 2017-01-15 MED ORDER — GLIMEPIRIDE 4 MG PO TABS: ORAL_TABLET | ORAL | 0 refills | Status: DC

## 2017-01-15 NOTE — Progress Notes (Signed)
Name: Paul Spears   MRN: [id number]    DOB: January 14, 1946   Date:01/15/2017       Progress Note  Subjective  Chief Complaint  Chief Complaint  Patient presents with  . Follow-up    3 mo  . Medication Refill    Diabetes  He presents for his follow-up diabetic visit. He has type 2 diabetes mellitus. His disease course has been improving. There are no hypoglycemic associated symptoms. Pertinent negatives for hypoglycemia include no dizziness, headaches, nervousness/anxiousness or sweats. Pertinent negatives for diabetes include no blurred vision, no chest pain, no fatigue, no foot paresthesias, no polydipsia, no polyuria and no weight loss. Pertinent negatives for diabetic complications include no CVA, heart disease or peripheral neuropathy. Current diabetic treatment includes oral agent (triple therapy). He is following a generally unhealthy (eats outside, does not cook in the house) diet. He monitors blood glucose at home 1-2 x per day. His breakfast blood glucose range is generally 130-140 mg/dl. An ACE inhibitor/angiotensin II receptor blocker is being taken.  Hyperlipidemia  This is a chronic problem. The problem is controlled. Recent lipid tests were reviewed and are normal. Exacerbating diseases include diabetes. Pertinent negatives include no chest pain, leg pain, myalgias or shortness of breath. Current antihyperlipidemic treatment includes statins.  Hypertension  This is a chronic problem. The problem is unchanged. The problem is controlled. Pertinent negatives include no blurred vision, chest pain, headaches, palpitations, shortness of breath or sweats. Past treatments include diuretics and angiotensin blockers. There is no history of kidney disease, CAD/MI or CVA.      Past Medical History:  Diagnosis Date  . Diabetes mellitus without complication ([place])   . Hyperlipidemia   . Hypertension   . Kidney disease, chronic, stage III (GFR 30-59 ml/min)     Past Surgical History:   Procedure Laterality Date  . CARPAL TUNNEL RELEASE Right   . ESOPHAGOGASTRODUODENOSCOPY (EGD) WITH PROPOFOL N/A 10/22/2016   Procedure: ESOPHAGOGASTRODUODENOSCOPY (EGD) WITH PROPOFOL;  Surgeon: Lollie Sails, MD;  Location: [facility] ENDOSCOPY;  Service: Endoscopy;  Laterality: N/A;  . FRACTURE SURGERY      Family History  Problem Relation Age of Onset  . Hypertension Father   . Heart attack Father   . Heart disease Sister   . Hypertension Sister   . Hyperlipidemia Sister     Social History   Social History  . Marital status: Divorced    Spouse name: N/A  . Number of children: N/A  . Years of education: N/A   Occupational History  . Not on file.   Social History Main Topics  . Smoking status: Never Smoker  . Smokeless tobacco: Never Used  . Alcohol use No  . Drug use: No  . Sexual activity: Not on file   Other Topics Concern  . Not on file   Social History Narrative  . No narrative on file     Current Outpatient Prescriptions:  .  ACCU-CHEK SOFTCLIX LANCETS lancets, , Disp: , Rfl:  .  atorvastatin (LIPITOR) 20 MG tablet, Take 1 tablet (20 mg total) by mouth daily., Disp: 90 tablet, Rfl: 0 .  Blood Glucose Monitoring Suppl (ACCU-CHEK AVIVA) device, USE AS DIRECTED, Disp: 100 each, Rfl: 0 .  Blood Glucose Monitoring Suppl (ACCU-CHEK AVIVA) device, USE AS DIRECTED, Disp: 100 each, Rfl: 0 .  esomeprazole (NEXIUM) 20 MG capsule, Take 20 mg by mouth daily at 12 noon. , Disp: , Rfl:  .  glimepiride (AMARYL) 4 MG tablet, TAKE 1 TABLET  BY MOUTH ONCE DAILY WITH  BREAKFAST, Disp: 90 tablet, Rfl: 0 .  glucose blood (ACCU-CHEK AVIVA PLUS) test strip, Use as instructed, Disp: 100 each, Rfl: 12 .  JANUVIA 50 MG tablet, TAKE 1 TABLET BY MOUTH ONCE DAILY, Disp: 90 tablet, Rfl: 0 .  losartan-hydrochlorothiazide (HYZAAR) 50-12.5 MG tablet, Take 1 tablet by mouth daily., Disp: 90 tablet, Rfl: 0 .  metFORMIN (GLUCOPHAGE) 1000 MG tablet, TAKE 1 TABLET BY MOUTH TWICE DAILY WITH MEALS,  Disp: 180 tablet, Rfl: 0  No Known Allergies   Review of Systems  Constitutional: Negative for fatigue and weight loss.  Eyes: Negative for blurred vision.  Respiratory: Negative for shortness of breath.   Cardiovascular: Negative for chest pain and palpitations.  Musculoskeletal: Negative for myalgias.  Neurological: Negative for dizziness and headaches.  Endo/Heme/Allergies: Negative for polydipsia.  Psychiatric/Behavioral: The patient is not nervous/anxious.       Objective  Vitals:   01/15/17 0909  BP: 137/78  Pulse: (!) 108  Resp: 17  Temp: 98.3 F (36.8 C)  TempSrc: Oral  SpO2: 96%  Weight: 211 lb 4.8 oz (95.8 kg)  Height: 5\' 11"  (1.803 m)    Physical Exam  Constitutional: He is oriented to person, place, and time and well-developed, well-nourished, and in no distress.  HENT:  Head: Normocephalic and atraumatic.  Cardiovascular: Normal rate, regular rhythm and normal heart sounds.   No murmur heard. Pulmonary/Chest: Effort normal and breath sounds normal. He has no wheezes.  Abdominal: Soft. Bowel sounds are normal. There is no tenderness.  Musculoskeletal: He exhibits no edema.  Neurological: He is alert and oriented to person, place, and time.  Psychiatric: Mood, memory, affect and judgment normal.  Nursing note and vitals reviewed.    Assessment & Plan  1. Uncontrolled type 2 diabetes mellitus with stage 3 chronic kidney disease, without long-term current use of insulin (HCC) Obtain A1c, continue present pharmacotherapy - POCT Glucose (CBG) - glimepiride (AMARYL) 4 MG tablet; TAKE 1 TABLET BY MOUTH ONCE DAILY WITH  BREAKFAST  Dispense: 90 tablet; Refill: 0 - sitaGLIPtin (JANUVIA) 50 MG tablet; Take 1 tablet (50 mg total) by mouth daily.  Dispense: 90 tablet; Refill: 0 - metFORMIN (GLUCOPHAGE) 1000 MG tablet; Take 1 tablet (1,000 mg total) by mouth 2 (two) times daily with a meal.  Dispense: 180 tablet; Refill: 0 - HgB A1c  2. Essential  hypertension BP stable on present antihypertensive treatment - losartan-hydrochlorothiazide (HYZAAR) 50-12.5 MG tablet; Take 1 tablet by mouth daily.  Dispense: 90 tablet; Refill: 0  3. Pure hypercholesterolemia  - Lipid panel - COMPLETE METABOLIC PANEL WITH GFR  Kasi Lasky [facility] 01/15/2017 9:26 AM

## 2017-01-16 LAB — LIPID PANEL
CHOL/HDL RATIO: 4.7 ratio (ref <5.0)
Cholesterol: 182 mg/dL (ref <200)
HDL: 39 mg/dL — AB (ref >40)
LDL CALC: 120 mg/dL — AB (ref <100)
TRIGLYCERIDES: 115 mg/dL (ref <150)
VLDL: 23 mg/dL (ref <30)

## 2017-01-16 LAB — COMPLETE METABOLIC PANEL WITH GFR
ALT: 17 U/L (ref 9–46)
AST: 20 U/L (ref 10–35)
Albumin: 4.6 g/dL (ref 3.6–5.1)
Alkaline Phosphatase: 111 U/L (ref 40–115)
BILIRUBIN TOTAL: 0.4 mg/dL (ref 0.2–1.2)
BUN: 20 mg/dL (ref 7–25)
CHLORIDE: 105 mmol/L (ref 98–110)
CO2: 17 mmol/L — AB (ref 20–31)
Calcium: 9.4 mg/dL (ref 8.6–10.3)
Creat: 1.43 mg/dL — ABNORMAL HIGH (ref 0.70–1.18)
GFR, EST AFRICAN AMERICAN: 57 mL/min — AB (ref >=60)
GFR, EST NON AFRICAN AMERICAN: 49 mL/min — AB (ref >=60)
Glucose, Bld: 131 mg/dL — ABNORMAL HIGH (ref 65–99)
POTASSIUM: 4.7 mmol/L (ref 3.5–5.3)
Sodium: 139 mmol/L (ref 135–146)
Total Protein: 7.1 g/dL (ref 6.1–8.1)

## 2017-01-16 LAB — HEMOGLOBIN A1C
Hgb A1c MFr Bld: 7.3 % — ABNORMAL HIGH (ref <5.7)
Mean Plasma Glucose: 163 mg/dL

## 2017-01-17 ENCOUNTER — Telehealth: Payer: Self-pay

## 2017-01-17 MED ORDER — ATORVASTATIN CALCIUM 40 MG PO TABS: 40.0000 mg | ORAL_TABLET | Freq: Every day | ORAL | 0 refills | Status: DC

## 2017-01-17 NOTE — Telephone Encounter (Signed)
Patient has been notified of lab results and that he should increase atorvastatin from 20 mg to 40 mg at bedtime and a prescription has been sent to [place] [facility] per Dr. Manuella Ghazi, patient verbalized understanding

## 2017-01-22 DIAGNOSIS — M546 Pain in thoracic spine: Secondary | ICD-10-CM | POA: Diagnosis not present

## 2017-01-22 DIAGNOSIS — M545 Low back pain, unspecified: Secondary | ICD-10-CM | POA: Insufficient documentation

## 2017-02-05 DIAGNOSIS — R809 Proteinuria, unspecified: Secondary | ICD-10-CM | POA: Diagnosis not present

## 2017-02-05 DIAGNOSIS — E1122 Type 2 diabetes mellitus with diabetic chronic kidney disease: Secondary | ICD-10-CM | POA: Diagnosis not present

## 2017-02-05 DIAGNOSIS — I129 Hypertensive chronic kidney disease with stage 1 through stage 4 chronic kidney disease, or unspecified chronic kidney disease: Secondary | ICD-10-CM | POA: Diagnosis not present

## 2017-02-05 DIAGNOSIS — N183 Chronic kidney disease, stage 3 (moderate): Secondary | ICD-10-CM | POA: Diagnosis not present

## 2017-02-10 DIAGNOSIS — M545 Low back pain: Secondary | ICD-10-CM | POA: Diagnosis not present

## 2017-02-24 DIAGNOSIS — M545 Low back pain: Secondary | ICD-10-CM | POA: Diagnosis not present

## 2017-02-27 DIAGNOSIS — M545 Low back pain: Secondary | ICD-10-CM | POA: Diagnosis not present

## 2017-03-24 ENCOUNTER — Encounter: Payer: Self-pay | Admitting: Family Medicine

## 2017-04-18 ENCOUNTER — Ambulatory Visit: Payer: Medicare Other | Admitting: Family Medicine

## 2017-04-21 ENCOUNTER — Ambulatory Visit: Payer: Medicare Other | Admitting: Family Medicine

## 2017-04-21 ENCOUNTER — Ambulatory Visit: Payer: Medicare Other

## 2017-04-22 ENCOUNTER — Ambulatory Visit (INDEPENDENT_AMBULATORY_CARE_PROVIDER_SITE_OTHER): Payer: Medicare Other

## 2017-04-22 ENCOUNTER — Ambulatory Visit: Payer: Medicare Other | Admitting: Family Medicine

## 2017-04-22 ENCOUNTER — Encounter: Payer: Self-pay | Admitting: Family Medicine

## 2017-04-22 ENCOUNTER — Ambulatory Visit: Payer: Medicare Other

## 2017-04-22 VITALS — BP 138/74 | HR 90 | Temp 98.0°F | Resp 16 | Ht 71.0 in | Wt 214.9 lb

## 2017-04-22 VITALS — BP 138/74 | HR 90 | Temp 98.0°F | Resp 16 | Ht 71.0 in | Wt 214.0 lb

## 2017-04-22 DIAGNOSIS — E1165 Type 2 diabetes mellitus with hyperglycemia: Secondary | ICD-10-CM | POA: Diagnosis not present

## 2017-04-22 DIAGNOSIS — I10 Essential (primary) hypertension: Secondary | ICD-10-CM

## 2017-04-22 DIAGNOSIS — E1122 Type 2 diabetes mellitus with diabetic chronic kidney disease: Secondary | ICD-10-CM

## 2017-04-22 DIAGNOSIS — Z1159 Encounter for screening for other viral diseases: Secondary | ICD-10-CM | POA: Diagnosis not present

## 2017-04-22 DIAGNOSIS — N183 Chronic kidney disease, stage 3 (moderate): Secondary | ICD-10-CM

## 2017-04-22 DIAGNOSIS — Z0000 Encounter for general adult medical examination without abnormal findings: Secondary | ICD-10-CM

## 2017-04-22 DIAGNOSIS — IMO0002 Reserved for concepts with insufficient information to code with codable children: Secondary | ICD-10-CM

## 2017-04-22 DIAGNOSIS — E7800 Pure hypercholesterolemia, unspecified: Secondary | ICD-10-CM

## 2017-04-22 LAB — POCT GLYCOSYLATED HEMOGLOBIN (HGB A1C): HEMOGLOBIN A1C: 7.2

## 2017-04-22 LAB — LIPID PANEL
Cholesterol: 152 mg/dL (ref <200)
HDL: 41 mg/dL (ref >40)
LDL Cholesterol (Calc): 89 mg/dL (calc)
Non-HDL Cholesterol (Calc): 111 mg/dL (calc) (ref <130)
Total CHOL/HDL Ratio: 3.7 (calc) (ref <5.0)
Triglycerides: 120 mg/dL (ref <150)

## 2017-04-22 LAB — GLUCOSE, POCT (MANUAL RESULT ENTRY): POC GLUCOSE: 139 mg/dL — AB (ref 70–99)

## 2017-04-22 MED ORDER — LOSARTAN POTASSIUM-HCTZ 50-12.5 MG PO TABS: 1.0000 | ORAL_TABLET | Freq: Every day | ORAL | 0 refills | Status: DC

## 2017-04-22 MED ORDER — METFORMIN HCL 1000 MG PO TABS: 1000.0000 mg | ORAL_TABLET | Freq: Two times a day (BID) | ORAL | 0 refills | Status: DC

## 2017-04-22 MED ORDER — SITAGLIPTIN PHOSPHATE 50 MG PO TABS: 50.0000 mg | ORAL_TABLET | Freq: Every day | ORAL | 0 refills | Status: DC

## 2017-04-22 MED ORDER — GLIMEPIRIDE 4 MG PO TABS: ORAL_TABLET | ORAL | 0 refills | Status: DC

## 2017-04-22 MED ORDER — ATORVASTATIN CALCIUM 40 MG PO TABS: 40.0000 mg | ORAL_TABLET | Freq: Every day | ORAL | 0 refills | Status: DC

## 2017-04-22 NOTE — Patient Instructions (Addendum)
[name] , Thank you for taking time to come for your Medicare Wellness Visit. I appreciate your ongoing commitment to your health goals. Please review the following plan we discussed and let me know if I can assist you in the future.   Screening recommendations/referrals: Colonoscopy: Please provide a copy of your colonoscopy report. Recommended yearly ophthalmology/optometry visit for glaucoma screening and checkup:   Your provider would like to you have your annual eye exam. Please contact your current eye doctor or here are some good options for you to contact.   [facility] Address: [address] [place], [place] [address]   Address: [address], [place], [place] [address]  Phone: [phone]      Phone: [phone]  Website: [email or web address]    Website: [email or web address]     [facility]  Address: [address] [place], [place], [place] [address]   Address: [place], [place],  [address] Phone: [phone]      Phone: [phone]    [facility] Address: [place], [place],  [address]  Phone: [phone]  Recommended yearly dental visit for hygiene and checkup  Vaccinations: Influenza vaccine: Please provide a copy of your vaccination record from your local pharamcy Pneumococcal vaccine: Completed series Tdap vaccine: Declined. Please call your insurance company to determine your out of pocket expense. Shingles vaccine: Declined. Please call your insurance company to determine your out of pocket expense.  Advanced directives: Advance directive discussed with you today. Even though you declined this today please call our office should you change your mind and we can give you the proper paperwork for you to fill out.  Conditions/risks identified: Fall risk prevention discussed; Recommend to eat 3 small healthy meals and 2 healthy snacks per day  Next appointment: You are  scheduled to see Dr. Manuella Ghazi on 04/22/17 @ 10:00am. Please schedule your annual wellness exam with the Nurse Health Advisor in one year.  Preventive Care 16 Years and Older, Male Preventive care refers to lifestyle choices and visits with your health care provider that can promote health and wellness. What does preventive care include?  A yearly physical exam. This is also called an annual well check.  Dental exams once or twice a year.  Routine eye exams. Ask your health care provider how often you should have your eyes checked.  Personal lifestyle choices, including:  Daily care of your teeth and gums.  Regular physical activity.  Eating a healthy diet.  Avoiding tobacco and drug use.  Limiting alcohol use.  Practicing safe sex.  Taking low doses of aspirin every day.  Taking vitamin and mineral supplements as recommended by your health care provider. What happens during an annual well check? The services and screenings done by your health care provider during your annual well check will depend on your age, overall health, lifestyle risk factors, and family history of disease. Counseling  Your health care provider may ask you questions about your:  Alcohol use.  Tobacco use.  Drug use.  Emotional well-being.  Home and relationship well-being.  Sexual activity.  Eating habits.  History of falls.  Memory and ability to understand (cognition).  Work and work Statistician. Screening  You may have the following tests or measurements:  Height, weight, and BMI.  Blood pressure.  Lipid and cholesterol levels. These may be checked every 5  years, or more frequently if you are over 50 years old.  Skin check.  Lung cancer screening. You may have this screening every year starting at age 70 if you have a 30-pack-year history of smoking and currently smoke or have quit within the past 15 years.  Fecal occult blood test (FOBT) of the stool. You may have this test every  year starting at age 42.  Flexible sigmoidoscopy or colonoscopy. You may have a sigmoidoscopy every 5 years or a colonoscopy every 10 years starting at age 22.  Prostate cancer screening. Recommendations will vary depending on your family history and other risks.  Hepatitis C blood test.  Hepatitis B blood test.  Sexually transmitted disease (STD) testing.  Diabetes screening. This is done by checking your blood sugar (glucose) after you have not eaten for a while (fasting). You may have this done every 1-3 years.  Abdominal aortic aneurysm (AAA) screening. You may need this if you are a current or former smoker.  Osteoporosis. You may be screened starting at age 48 if you are at high risk. Talk with your health care provider about your test results, treatment options, and if necessary, the need for more tests. Vaccines  Your health care provider may recommend certain vaccines, such as:  Influenza vaccine. This is recommended every year.  Tetanus, diphtheria, and acellular pertussis (Tdap, Td) vaccine. You may need a Td booster every 10 years.  Zoster vaccine. You may need this after age 59.  Pneumococcal 13-valent conjugate (PCV13) vaccine. One dose is recommended after age 70.  Pneumococcal polysaccharide (PPSV23) vaccine. One dose is recommended after age 51. Talk to your health care provider about which screenings and vaccines you need and how often you need them. This information is not intended to replace advice given to you by your health care provider. Make sure you discuss any questions you have with your health care provider. Document Released: 06/30/2015 Document Revised: 02/21/2016 Document Reviewed: 04/04/2015 Elsevier Interactive Patient Education  2017 [place] Prevention in the Home Falls can cause injuries. They can happen to people of all ages. There are many things you can do to make your home safe and to help prevent falls. What can I do on the  outside of my home?  Regularly fix the edges of walkways and driveways and fix any cracks.  Remove anything that might make you trip as you walk through a door, such as a raised step or threshold.  Trim any bushes or trees on the path to your home.  Use bright outdoor lighting.  Clear any walking paths of anything that might make someone trip, such as rocks or tools.  Regularly check to see if handrails are loose or broken. Make sure that both sides of any steps have handrails.  Any raised decks and porches should have guardrails on the edges.  Have any leaves, snow, or ice cleared regularly.  Use sand or salt on walking paths during winter.  Clean up any spills in your garage right away. This includes oil or grease spills. What can I do in the bathroom?  Use night lights.  Install grab bars by the toilet and in the tub and shower. Do not use towel bars as grab bars.  Use non-skid mats or decals in the tub or shower.  If you need to sit down in the shower, use a plastic, non-slip stool.  Keep the floor dry. Clean up any water that spills on the floor as soon  as it happens.  Remove soap buildup in the tub or shower regularly.  Attach bath mats securely with double-sided non-slip rug tape.  Do not have throw rugs and other things on the floor that can make you trip. What can I do in the bedroom?  Use night lights.  Make sure that you have a light by your bed that is easy to reach.  Do not use any sheets or blankets that are too big for your bed. They should not hang down onto the floor.  Have a firm chair that has side arms. You can use this for support while you get dressed.  Do not have throw rugs and other things on the floor that can make you trip. What can I do in the kitchen?  Clean up any spills right away.  Avoid walking on wet floors.  Keep items that you use a lot in easy-to-reach places.  If you need to reach something above you, use a strong step  stool that has a grab bar.  Keep electrical cords out of the way.  Do not use floor polish or wax that makes floors slippery. If you must use wax, use non-skid floor wax.  Do not have throw rugs and other things on the floor that can make you trip. What can I do with my stairs?  Do not leave any items on the stairs.  Make sure that there are handrails on both sides of the stairs and use them. Fix handrails that are broken or loose. Make sure that handrails are as long as the stairways.  Check any carpeting to make sure that it is firmly attached to the stairs. Fix any carpet that is loose or worn.  Avoid having throw rugs at the top or bottom of the stairs. If you do have throw rugs, attach them to the floor with carpet tape.  Make sure that you have a light switch at the top of the stairs and the bottom of the stairs. If you do not have them, ask someone to add them for you. What else can I do to help prevent falls?  Wear shoes that:  Do not have high heels.  Have rubber bottoms.  Are comfortable and fit you well.  Are closed at the toe. Do not wear sandals.  If you use a stepladder:  Make sure that it is fully opened. Do not climb a closed stepladder.  Make sure that both sides of the stepladder are locked into place.  Ask someone to hold it for you, if possible.  Clearly mark and make sure that you can see:  Any grab bars or handrails.  First and last steps.  Where the edge of each step is.  Use tools that help you move around (mobility aids) if they are needed. These include:  Canes.  Walkers.  Scooters.  Crutches.  Turn on the lights when you go into a dark area. Replace any light bulbs as soon as they burn out.  Set up your furniture so you have a clear path. Avoid moving your furniture around.  If any of your floors are uneven, fix them.  If there are any pets around you, be aware of where they are.  Review your medicines with your doctor. Some  medicines can make you feel dizzy. This can increase your chance of falling. Ask your doctor what other things that you can do to help prevent falls. This information is not intended to replace advice given to  you by your health care provider. Make sure you discuss any questions you have with your health care provider. Document Released: 03/30/2009 Document Revised: 11/09/2015 Document Reviewed: 07/08/2014 Elsevier Interactive Patient Education  2017 [employer].

## 2017-04-22 NOTE — Progress Notes (Signed)
Subjective:   Paul Spears is a 71 y.o. male who presents for Medicare Annual/Subsequent preventive examination.  Review of Systems:  N/A       Objective:    Vitals: BP 138/74 (BP Location: Right Arm, Patient Position: Sitting, Cuff Size: Normal)   Pulse 90   Temp 98 F (36.7 C) (Oral)   Resp 16   Ht 5\' 11"  (1.803 m)   Wt 214 lb 14.4 oz (97.5 kg)   BMI 29.97 kg/m   Body mass index is 29.97 kg/m.  Tobacco Social History   Tobacco Use  Smoking Status Never Smoker  Smokeless Tobacco Never Used     Counseling given: Not Answered   Past Medical History:  Diagnosis Date  . Diabetes mellitus without complication ([place])   . Hyperlipidemia   . Hypertension   . Kidney disease, chronic, stage III (GFR 30-59 ml/min) (HCC)    Past Surgical History:  Procedure Laterality Date  . CARPAL TUNNEL RELEASE Right   . FRACTURE SURGERY     Family History  Problem Relation Age of Onset  . Hypertension Father   . Heart attack Father   . Heart disease Sister   . Hypertension Sister   . Hyperlipidemia Sister    Social History   Substance and Sexual Activity  Sexual Activity Not on file    Outpatient Encounter Medications as of 04/22/2017  Medication Sig  . atorvastatin (LIPITOR) 40 MG tablet Take 1 tablet (40 mg total) by mouth at bedtime.  . Blood Glucose Monitoring Suppl (ACCU-CHEK AVIVA) device USE AS DIRECTED  . Blood Glucose Monitoring Suppl (ACCU-CHEK AVIVA) device USE AS DIRECTED  . esomeprazole (NEXIUM) 20 MG capsule Take 20 mg by mouth daily at 12 noon.   Marland Kitchen glimepiride (AMARYL) 4 MG tablet TAKE 1 TABLET BY MOUTH ONCE DAILY WITH  BREAKFAST  . glucose blood (ACCU-CHEK AVIVA PLUS) test strip Use as instructed  . losartan-hydrochlorothiazide (HYZAAR) 50-12.5 MG tablet Take 1 tablet by mouth daily.  . metFORMIN (GLUCOPHAGE) 1000 MG tablet Take 1 tablet (1,000 mg total) by mouth 2 (two) times daily with a meal.  . sitaGLIPtin (JANUVIA) 50 MG tablet Take 1 tablet (50 mg  total) by mouth daily.  Marland Kitchen ACCU-CHEK SOFTCLIX LANCETS lancets    No facility-administered encounter medications on file as of 04/22/2017.     Activities of Daily Living In your present state of health, do you have any difficulty performing the following activities: 04/22/2017 01/15/2017  Hearing? Y Y  Comment bilateral hearing aids bilateral hearing aids  Vision? Y N  Comment Provided contact info for [facility] to [facility] care -  Difficulty concentrating or making decisions? Y N  Comment occasional short term memmory loss -  Walking or climbing stairs? Y N  Comment pain in knees -  Dressing or bathing? N N  Doing errands, shopping? N N  Preparing Food and eating ? N -  Using the Toilet? N -  In the past six months, have you accidently leaked urine? N -  Do you have problems with loss of bowel control? N -  Managing your Medications? N -  Managing your Finances? N -  Housekeeping or managing your Housekeeping? N -  Some recent data might be hidden    Patient Care Team: Roselee Nova, MD as PCP - General (Family Medicine)   Assessment:     Exercise Activities and Dietary recommendations    Goals    None  Fall Risk: TUG Test score = 11 sec Fall Risk  04/22/2017 01/15/2017 10/15/2016 07/17/2016 01/04/2016  Falls in the past year? No No No No No   Depression Screen PHQ 2/9 Scores 04/22/2017 01/15/2017 10/15/2016 07/17/2016  PHQ - 2 Score 0 0 0 0    Cognitive Function     6CIT Screen 04/22/2017  What Year? 0 points  What month? 0 points  What time? 0 points  Count back from 20 0 points  Months in reverse 2 points  Repeat phrase 4 points  Total Score 6    Immunization History  Administered Date(s) Administered  . Influenza-Unspecified 02/18/2013, 03/18/2015, 03/26/2016  . Pneumococcal Conjugate-13 04/08/2016  . Pneumococcal Polysaccharide-23 08/03/2012   Screening Tests Health Maintenance  Topic Date Due  . Hepatitis C Screening  [birth year]  .  OPHTHALMOLOGY EXAM  01/10/1956  . TETANUS/TDAP  01/09/1965  . COLONOSCOPY  01/10/1996  . INFLUENZA VACCINE  01/15/2017  . HEMOGLOBIN A1C  07/18/2017  . FOOT EXAM  10/15/2017  . PNA vac Low Risk Adult  Completed      Plan:   I have personally reviewed and addressed the Medicare Annual Wellness questionnaire and have noted the following in the patient's chart:  A. Medical and social history B. Use of alcohol, tobacco or illicit drugs  C. Current medications and supplements D. Functional ability and status E.  Nutritional status F.  Physical activity G. Advance directives and Code Status: Advance directives and MOST/DNR discussed. Pt declined offer to provide him with documents. Advised if he should change his mind, please call our office and we can give him the proper paperwork to complete. H. List of other physicians I.  Hospitalizations, surgeries, and ER visits in previous 12 months J.  [place] such as hearing and vision if needed, cognitive and depression L. Referrals and appointments - none  In addition, I have reviewed and discussed with patient certain preventive protocols, quality metrics, and best practice recommendations. A written personalized care plan for preventive services as well as general preventive health recommendations were provided to patient.  See attached scanned questionnaire for additional information.   Signed,  Aleatha Borer, LPN Nurse Health Advisor   Recommendations for Immunizations per CDC guidelines:  Vaccinations: Influenza vaccine: Pt states he had a flu vaccine at his local pharmacy. Advised to provide a copy of his vaccination record prior to his next OV Pneumococcal vaccine: Completed series Tdap vaccine: Declined. Pt advised to check with his insurance re: his out of pocket expense. Also advised he is able to receive this vaccine from his local pharmacy, Health Dept or the [place]. Shingles vaccine: Declined. Pt advised to check  with his insurance re: his out of pocket expense. Also advised he is able to receive this vaccine from his local pharmacy, Health Dept or the [place].   Recommendations for Health Maintenance Screenings:  Screenings: Colonoscopy: Pt states he completed his colonoscopy at [facility] in 2012. Pt advised to provide copy of his colonoscopy report prior to his next office visit. Pt states he was advised to repeat colonoscopy in 2022. Hep C Screening: Ordered today Recommended yearly ophthalmology/optometry visit for glaucoma screening and checkup Recommended yearly dental visit for hygiene and checkup

## 2017-04-22 NOTE — Progress Notes (Signed)
Name: Paul Spears   MRN: [id number]    DOB: March 27, 1946   Date:04/22/2017       Progress Note  Subjective  Chief Complaint  Chief Complaint  Patient presents with  . Hyperlipidemia    f/u  . Hypertension    f/u  . Diabetes    f/u  . Medication Refill    all maintence meds    Hyperlipidemia  This is a chronic problem. The problem is uncontrolled. Recent lipid tests were reviewed and are high (elevated LDL). Exacerbating diseases include diabetes. Pertinent negatives include no chest pain, leg pain, myalgias or shortness of breath. Current antihyperlipidemic treatment includes statins.  Hypertension  This is a chronic problem. The problem is unchanged. The problem is controlled. Pertinent negatives include no blurred vision, chest pain, headaches, palpitations, shortness of breath or sweats. Past treatments include diuretics and angiotensin blockers. There is no history of kidney disease, CAD/MI or CVA.  Diabetes  He presents for his follow-up diabetic visit. He has type 2 diabetes mellitus. His disease course has been improving. There are no hypoglycemic associated symptoms. Pertinent negatives for hypoglycemia include no dizziness, headaches, nervousness/anxiousness or sweats. Pertinent negatives for diabetes include no blurred vision, no chest pain, no foot paresthesias, no polydipsia, no polyuria and no weight loss. Pertinent negatives for diabetic complications include no CVA, heart disease or peripheral neuropathy. Current diabetic treatment includes oral agent (triple therapy). He is following a generally unhealthy (eats outside, does not cook in the house) diet. He monitors blood glucose at home 1-2 x per day. His breakfast blood glucose range is generally 130-140 mg/dl. An ACE inhibitor/angiotensin II receptor blocker is being taken.     Past Medical History:  Diagnosis Date  . Diabetes mellitus without complication ([place])   . Hyperlipidemia   . Hypertension   . Kidney disease,  chronic, stage III (GFR 30-59 ml/min) (HCC)     Past Surgical History:  Procedure Laterality Date  . CARPAL TUNNEL RELEASE Right   . FRACTURE SURGERY      Family History  Problem Relation Age of Onset  . Hypertension Father   . Heart attack Father   . Heart disease Sister   . Hypertension Sister   . Hyperlipidemia Sister     Social History   Socioeconomic History  . Marital status: Divorced    Spouse name: Not on file  . Number of children: Not on file  . Years of education: Not on file  . Highest education level: Not on file  Social Needs  . Financial resource strain: Not on file  . Food insecurity - worry: Not on file  . Food insecurity - inability: Not on file  . Transportation needs - medical: Not on file  . Transportation needs - non-medical: Not on file  Occupational History  . Not on file  Tobacco Use  . Smoking status: Never Smoker  . Smokeless tobacco: Never Used  Substance and Sexual Activity  . Alcohol use: No  . Drug use: No  . Sexual activity: Not on file  Other Topics Concern  . Not on file  Social History Narrative  . Not on file     Current Outpatient Medications:  .  ACCU-CHEK SOFTCLIX LANCETS lancets, , Disp: , Rfl:  .  atorvastatin (LIPITOR) 40 MG tablet, Take 1 tablet (40 mg total) by mouth at bedtime., Disp: 90 tablet, Rfl: 0 .  Blood Glucose Monitoring Suppl (ACCU-CHEK AVIVA) device, USE AS DIRECTED, Disp: 100 each, Rfl: 0 .  Blood Glucose Monitoring Suppl (ACCU-CHEK AVIVA) device, USE AS DIRECTED, Disp: 100 each, Rfl: 0 .  esomeprazole (NEXIUM) 20 MG capsule, Take 20 mg by mouth daily at 12 noon. , Disp: , Rfl:  .  glimepiride (AMARYL) 4 MG tablet, TAKE 1 TABLET BY MOUTH ONCE DAILY WITH  BREAKFAST, Disp: 90 tablet, Rfl: 0 .  glucose blood (ACCU-CHEK AVIVA PLUS) test strip, Use as instructed, Disp: 100 each, Rfl: 12 .  losartan-hydrochlorothiazide (HYZAAR) 50-12.5 MG tablet, Take 1 tablet by mouth daily., Disp: 90 tablet, Rfl: 0 .   metFORMIN (GLUCOPHAGE) 1000 MG tablet, Take 1 tablet (1,000 mg total) by mouth 2 (two) times daily with a meal., Disp: 180 tablet, Rfl: 0 .  sitaGLIPtin (JANUVIA) 50 MG tablet, Take 1 tablet (50 mg total) by mouth daily., Disp: 90 tablet, Rfl: 0  No Known Allergies   Review of Systems  Constitutional: Negative for weight loss.  Eyes: Negative for blurred vision.  Respiratory: Negative for shortness of breath.   Cardiovascular: Negative for chest pain and palpitations.  Musculoskeletal: Negative for myalgias.  Neurological: Negative for dizziness and headaches.  Endo/Heme/Allergies: Negative for polydipsia.  Psychiatric/Behavioral: The patient is not nervous/anxious.       Objective  Vitals:   04/22/17 0954  BP: 138/74  Pulse: 90  Resp: 16  Temp: 98 F (36.7 C)  TempSrc: Oral  SpO2: 98%  Weight: 214 lb (97.1 kg)  Height: 5\' 11"  (1.803 m)    Physical Exam  Constitutional: He is oriented to person, place, and time and well-developed, well-nourished, and in no distress.  HENT:  Head: Normocephalic and atraumatic.  Cardiovascular: Normal rate, regular rhythm and normal heart sounds.  No murmur heard. Pulmonary/Chest: Effort normal and breath sounds normal. He has no wheezes.  Abdominal: Soft. Bowel sounds are normal. There is no tenderness.  Musculoskeletal: He exhibits no edema.  Neurological: He is alert and oriented to person, place, and time.  Psychiatric: Mood, memory, affect and judgment normal.  Nursing note and vitals reviewed.    Recent Results (from the past 2160 hour(s))  POCT Glucose (CBG)     Status: Abnormal   Collection Time: 04/22/17  9:53 AM  Result Value Ref Range   POC Glucose 139 (A) 70 - 99 mg/dl  POCT HgB A1C     Status: Abnormal   Collection Time: 04/22/17 10:13 AM  Result Value Ref Range   Hemoglobin A1C 7.2      Assessment & Plan  1. Essential hypertension BP stable on present antihypertensive treatment -  losartan-hydrochlorothiazide (HYZAAR) 50-12.5 MG tablet; Take 1 tablet daily by mouth.  Dispense: 90 tablet; Refill: 0  2. Uncontrolled type 2 diabetes mellitus with stage 3 chronic kidney disease, without long-term current use of insulin (HCC) A1c at goal at 7.2%, no change in pharmacotherapy - POCT HgB A1C - POCT Glucose (CBG) - glimepiride (AMARYL) 4 MG tablet; TAKE 1 TABLET BY MOUTH ONCE DAILY WITH  BREAKFAST  Dispense: 90 tablet; Refill: 0 - metFORMIN (GLUCOPHAGE) 1000 MG tablet; Take 1 tablet (1,000 mg total) 2 (two) times daily with a meal by mouth.  Dispense: 180 tablet; Refill: 0 - sitaGLIPtin (JANUVIA) 50 MG tablet; Take 1 tablet (50 mg total) daily by mouth.  Dispense: 90 tablet; Refill: 0  3. Pure hypercholesterolemia  - atorvastatin (LIPITOR) 40 MG tablet; Take 1 tablet (40 mg total) at bedtime by mouth.  Dispense: 90 tablet; Refill: 0 - Lipid panel   Aubriegh Minch [facility] 04/22/2017 10:14 AM

## 2017-04-23 LAB — HEPATITIS C ANTIBODY
HEP C AB: NONREACTIVE
SIGNAL TO CUT-OFF: 0 (ref <1.00)

## 2017-04-30 ENCOUNTER — Other Ambulatory Visit: Payer: Self-pay | Admitting: Family Medicine

## 2017-04-30 DIAGNOSIS — E1122 Type 2 diabetes mellitus with diabetic chronic kidney disease: Secondary | ICD-10-CM

## 2017-04-30 DIAGNOSIS — E1165 Type 2 diabetes mellitus with hyperglycemia: Principal | ICD-10-CM

## 2017-04-30 DIAGNOSIS — IMO0002 Reserved for concepts with insufficient information to code with codable children: Secondary | ICD-10-CM

## 2017-04-30 DIAGNOSIS — N183 Chronic kidney disease, stage 3 (moderate): Principal | ICD-10-CM

## 2017-05-02 ENCOUNTER — Other Ambulatory Visit: Payer: Self-pay

## 2017-05-02 DIAGNOSIS — IMO0002 Reserved for concepts with insufficient information to code with codable children: Secondary | ICD-10-CM

## 2017-05-02 DIAGNOSIS — E1122 Type 2 diabetes mellitus with diabetic chronic kidney disease: Secondary | ICD-10-CM

## 2017-05-02 DIAGNOSIS — N183 Chronic kidney disease, stage 3 (moderate): Principal | ICD-10-CM

## 2017-05-02 DIAGNOSIS — E1165 Type 2 diabetes mellitus with hyperglycemia: Principal | ICD-10-CM

## 2017-05-02 MED ORDER — GLUCOSE BLOOD VI STRP: ORAL_STRIP | 11 refills | Status: DC

## 2017-05-02 NOTE — Telephone Encounter (Signed)
Per Dr. Trena Platt most recent note, pt is checking BG's 1-2 times a day. Order updated to reflect this.

## 2017-05-02 NOTE — Telephone Encounter (Signed)
Rx For Accu-check aviva plus strips was sent to [facility] on 04/30/2017. I received a fax back from them asking for a specific directions and Dx code as well.

## 2017-06-25 ENCOUNTER — Encounter: Payer: Self-pay | Admitting: Family Medicine

## 2017-07-10 ENCOUNTER — Ambulatory Visit
Admission: RE | Admit: 2017-07-10 | Discharge: 2017-07-10 | Disposition: A | Discharge status: Home / Self Care | Payer: Medicare Other | Source: Ambulatory Visit | Attending: Family Medicine | Admitting: Family Medicine

## 2017-07-10 ENCOUNTER — Ambulatory Visit (INDEPENDENT_AMBULATORY_CARE_PROVIDER_SITE_OTHER): Payer: Medicare Other | Admitting: Family Medicine

## 2017-07-10 ENCOUNTER — Encounter: Payer: Self-pay | Admitting: Family Medicine

## 2017-07-10 VITALS — BP 126/68 | HR 102 | Temp 98.2°F | Resp 18 | Ht 71.0 in | Wt 216.4 lb

## 2017-07-10 DIAGNOSIS — M542 Cervicalgia: Secondary | ICD-10-CM | POA: Diagnosis not present

## 2017-07-10 DIAGNOSIS — M4802 Spinal stenosis, cervical region: Secondary | ICD-10-CM | POA: Diagnosis not present

## 2017-07-10 DIAGNOSIS — M5030 Other cervical disc degeneration, unspecified cervical region: Secondary | ICD-10-CM | POA: Diagnosis not present

## 2017-07-10 MED ORDER — TIZANIDINE HCL 2 MG PO TABS: 2.0000 mg | ORAL_TABLET | Freq: Three times a day (TID) | ORAL | 0 refills | Status: AC | PRN

## 2017-07-10 NOTE — Progress Notes (Signed)
Name: Paul Spears   MRN: [id number]    DOB: 1946-01-30   Date:07/10/2017       Progress Note  Subjective  Chief Complaint  Chief Complaint  Patient presents with  . Follow-up    patient is here for a 3 month  . Medication Refill    all meds, patient is needing 90 day supply  . Neck Pain    patient presents with right side neck pain for quite some time. he stated that it is a different type of pain that radiates down his shoulder. otc: BC powder. tried some heat but no relief.    Neck Pain   This is a chronic problem. The current episode started more than 1 year ago (started with pain on the left side of the neck 18 months ago, was diagnosed as arthritis, he states the pain on the right side has been going on for about the same time). The problem has been unchanged. The pain is present in the right side. The pain is at a severity of 2/10. The symptoms are aggravated by bending and position (rotation, 'moving around' etc.). Stiffness is present in the morning. He has tried NSAIDs (Has been taking BC powder) for the symptoms. The treatment provided moderate relief.    Past Medical History:  Diagnosis Date  . Diabetes mellitus without complication ([place])   . Hyperlipidemia   . Hypertension   . Kidney disease, chronic, stage III (GFR 30-59 ml/min) (HCC)     Past Surgical History:  Procedure Laterality Date  . CARPAL TUNNEL RELEASE Right   . ESOPHAGOGASTRODUODENOSCOPY (EGD) WITH PROPOFOL N/A 10/22/2016   Procedure: ESOPHAGOGASTRODUODENOSCOPY (EGD) WITH PROPOFOL;  Surgeon: Lollie Sails, MD;  Location: [facility] ENDOSCOPY;  Service: Endoscopy;  Laterality: N/A;  . FRACTURE SURGERY      Family History  Problem Relation Age of Onset  . Hypertension Father   . Heart attack Father   . Heart disease Sister   . Hypertension Sister   . Hyperlipidemia Sister     Social History   Socioeconomic History  . Marital status: Divorced    Spouse name: Not on file  . Number of children: Not  on file  . Years of education: Not on file  . Highest education level: Not on file  Social Needs  . Financial resource strain: Not on file  . Food insecurity - worry: Not on file  . Food insecurity - inability: Not on file  . Transportation needs - medical: Not on file  . Transportation needs - non-medical: Not on file  Occupational History  . Not on file  Tobacco Use  . Smoking status: Never Smoker  . Smokeless tobacco: Never Used  Substance and Sexual Activity  . Alcohol use: No  . Drug use: No  . Sexual activity: No  Other Topics Concern  . Not on file  Social History Narrative  . Not on file     Current Outpatient Medications:  .  ACCU-CHEK SOFTCLIX LANCETS lancets, , Disp: , Rfl:  .  atorvastatin (LIPITOR) 40 MG tablet, Take 1 tablet (40 mg total) at bedtime by mouth., Disp: 90 tablet, Rfl: 0 .  Blood Glucose Monitoring Suppl (ACCU-CHEK AVIVA) device, USE AS DIRECTED, Disp: 100 each, Rfl: 0 .  Blood Glucose Monitoring Suppl (ACCU-CHEK AVIVA) device, USE AS DIRECTED, Disp: 100 each, Rfl: 0 .  esomeprazole (NEXIUM) 20 MG capsule, Take 20 mg by mouth daily at 12 noon. , Disp: , Rfl:  .  glimepiride (  AMARYL) 4 MG tablet, TAKE 1 TABLET BY MOUTH ONCE DAILY WITH  BREAKFAST, Disp: 90 tablet, Rfl: 0 .  glucose blood (ACCU-CHEK AVIVA PLUS) test strip, USE AS DIRECTED - may check glucose 1-2 times daily., Disp: 100 each, Rfl: 11 .  losartan-hydrochlorothiazide (HYZAAR) 50-12.5 MG tablet, Take 1 tablet daily by mouth., Disp: 90 tablet, Rfl: 0 .  metFORMIN (GLUCOPHAGE) 1000 MG tablet, Take 1 tablet (1,000 mg total) 2 (two) times daily with a meal by mouth., Disp: 180 tablet, Rfl: 0 .  sitaGLIPtin (JANUVIA) 50 MG tablet, Take 1 tablet (50 mg total) daily by mouth., Disp: 90 tablet, Rfl: 0  No Known Allergies   Review of Systems  Musculoskeletal: Positive for neck pain.      Objective  Vitals:   07/10/17 0835  BP: 126/68  Pulse: (!) 102  Resp: 18  Temp: 98.2 F (36.8 C)    TempSrc: Oral  SpO2: 99%  Weight: 216 lb 6.4 oz (98.2 kg)  Height: 5\' 11"  (1.803 m)    Physical Exam  Musculoskeletal:       Cervical back: He exhibits tenderness and spasm. He exhibits no swelling and no edema.       Back:  Tenderness to palpation over the right lateral neck Normal DTRs in right arm (elbow and wrist).   Nursing note and vitals reviewed.    Recent Results (from the past 2160 hour(s))  POCT Glucose (CBG)     Status: Abnormal   Collection Time: 04/22/17  9:53 AM  Result Value Ref Range   POC Glucose 139 (A) 70 - 99 mg/dl  POCT HgB A1C     Status: Abnormal   Collection Time: 04/22/17 10:13 AM  Result Value Ref Range   Hemoglobin A1C 7.2   Hepatitis C antibody screen     Status: None   Collection Time: 04/22/17 10:41 AM  Result Value Ref Range   Hepatitis C Ab NON-REACTIVE NON-REACTI   SIGNAL TO CUT-OFF 0.00 <1.00  Lipid panel     Status: None   Collection Time: 04/22/17 10:43 AM  Result Value Ref Range   Cholesterol 152 <200 mg/dL   HDL 41 >40 mg/dL   Triglycerides 120 <150 mg/dL   LDL Cholesterol (Calc) 89 mg/dL (calc)    Comment: Reference range: <100 . Desirable range <100 mg/dL for primary prevention;   <70 mg/dL for patients with CHD or diabetic patients  with > or = 2 CHD risk factors. Marland Kitchen LDL-C is now calculated using the Martin-Hopkins  calculation, which is a validated novel method providing  better accuracy than the Friedewald equation in the  estimation of LDL-C.  Cresenciano Genre et al. Annamaria Helling. 8527;782(42): 2061-2068  (http://education.[employer].com/faq/FAQ164)    Total CHOL/HDL Ratio 3.7 <5.0 (calc)   Non-HDL Cholesterol (Calc) 111 <130 mg/dL (calc)    Comment: For patients with diabetes plus 1 major ASCVD risk  factor, treating to a non-HDL-C goal of <100 mg/dL  (LDL-C of <70 mg/dL) is considered a therapeutic  option.      Assessment & Plan  1. Musculoskeletal neck pain Suspect musculoskeletal neck pain versus arthritis,  obtain x-rays, start on muscle relaxer - DG Cervical Spine Complete; Future - tiZANidine (ZANAFLEX) 2 MG tablet; Take 1 tablet (2 mg total) by mouth every 8 (eight) hours as needed for up to 10 days for muscle spasms.  Dispense: 30 tablet; Refill: 0   Zafar Debrosse [facility] 07/10/2017 8:51 AM

## 2017-07-24 ENCOUNTER — Ambulatory Visit (INDEPENDENT_AMBULATORY_CARE_PROVIDER_SITE_OTHER): Payer: Medicare Other | Admitting: Family Medicine

## 2017-07-24 ENCOUNTER — Other Ambulatory Visit: Payer: Self-pay

## 2017-07-24 ENCOUNTER — Encounter: Payer: Self-pay | Admitting: Family Medicine

## 2017-07-24 VITALS — BP 124/78 | HR 100 | Temp 97.7°F | Resp 14 | Wt 211.3 lb

## 2017-07-24 DIAGNOSIS — E1122 Type 2 diabetes mellitus with diabetic chronic kidney disease: Secondary | ICD-10-CM | POA: Diagnosis not present

## 2017-07-24 DIAGNOSIS — I10 Essential (primary) hypertension: Secondary | ICD-10-CM

## 2017-07-24 DIAGNOSIS — E7800 Pure hypercholesterolemia, unspecified: Secondary | ICD-10-CM

## 2017-07-24 DIAGNOSIS — IMO0002 Reserved for concepts with insufficient information to code with codable children: Secondary | ICD-10-CM

## 2017-07-24 DIAGNOSIS — N183 Chronic kidney disease, stage 3 (moderate): Secondary | ICD-10-CM | POA: Diagnosis not present

## 2017-07-24 DIAGNOSIS — E1165 Type 2 diabetes mellitus with hyperglycemia: Principal | ICD-10-CM

## 2017-07-24 MED ORDER — ATORVASTATIN CALCIUM 40 MG PO TABS: 40.0000 mg | ORAL_TABLET | Freq: Every day | ORAL | 0 refills | Status: DC

## 2017-07-24 MED ORDER — LOSARTAN POTASSIUM-HCTZ 50-12.5 MG PO TABS: 1.0000 | ORAL_TABLET | Freq: Every day | ORAL | 0 refills | Status: DC

## 2017-07-24 MED ORDER — GLIMEPIRIDE 4 MG PO TABS: ORAL_TABLET | ORAL | 0 refills | Status: DC

## 2017-07-24 MED ORDER — METFORMIN HCL 1000 MG PO TABS: 1000.0000 mg | ORAL_TABLET | Freq: Two times a day (BID) | ORAL | 0 refills | Status: DC

## 2017-07-24 MED ORDER — SITAGLIPTIN PHOSPHATE 50 MG PO TABS: 50.0000 mg | ORAL_TABLET | Freq: Every day | ORAL | 0 refills | Status: DC

## 2017-07-24 NOTE — Progress Notes (Signed)
Name: Paul Spears   MRN: [id number]    DOB: 1945/11/28   Date:07/24/2017       Progress Note  Subjective  Chief Complaint  Chief Complaint  Patient presents with  . Medication Refill    Pt states he dont eat a full meal. Pt states he is trying to stay away form carbs but it get hard   . Hypertension    Pt denies any issues   . Diabetes    Pt states he checks his sugars daily and he states that it dont run to high unless he eat something     Hypertension  This is a chronic problem. The problem is unchanged. The problem is controlled. Pertinent negatives include no blurred vision (believes he has catarcts in the right eye.), chest pain, headaches, palpitations, shortness of breath or sweats. Past treatments include angiotensin blockers and diuretics. There is no history of kidney disease, CAD/MI or CVA.  Diabetes  He presents for his follow-up diabetic visit. He has type 2 diabetes mellitus. His disease course has been stable. Pertinent negatives for hypoglycemia include no dizziness, headaches, nervousness/anxiousness or sweats. Pertinent negatives for diabetes include no blurred vision (believes he has catarcts in the right eye.), no chest pain, no foot paresthesias, no polydipsia and no polyuria. Pertinent negatives for diabetic complications include no CVA, heart disease or peripheral neuropathy. Current diabetic treatment includes oral agent (triple therapy). He is following a generally healthy diet. He rarely participates in exercise. He monitors blood glucose at home 1-2 x per day. His breakfast blood glucose range is generally 130-140 mg/dl. An ACE inhibitor/angiotensin II receptor blocker is being taken. He does not see a podiatrist.Eye exam is current.  Hyperlipidemia  This is a chronic problem. The problem is controlled. Recent lipid tests were reviewed and are normal. Pertinent negatives include no chest pain, leg pain, myalgias or shortness of breath. Current antihyperlipidemic  treatment includes statins.     Past Medical History:  Diagnosis Date  . Diabetes mellitus without complication ([place])   . Hyperlipidemia   . Hypertension   . Kidney disease, chronic, stage III (GFR 30-59 ml/min) (HCC)     Past Surgical History:  Procedure Laterality Date  . CARPAL TUNNEL RELEASE Right   . ESOPHAGOGASTRODUODENOSCOPY (EGD) WITH PROPOFOL N/A 10/22/2016   Procedure: ESOPHAGOGASTRODUODENOSCOPY (EGD) WITH PROPOFOL;  Surgeon: Lollie Sails, MD;  Location: [facility] ENDOSCOPY;  Service: Endoscopy;  Laterality: N/A;  . FRACTURE SURGERY      Family History  Problem Relation Age of Onset  . Hypertension Father   . Heart attack Father   . Heart disease Sister   . Hypertension Sister   . Hyperlipidemia Sister     Social History   Socioeconomic History  . Marital status: Divorced    Spouse name: Not on file  . Number of children: Not on file  . Years of education: Not on file  . Highest education level: Not on file  Social Needs  . Financial resource strain: Not on file  . Food insecurity - worry: Not on file  . Food insecurity - inability: Not on file  . Transportation needs - medical: Not on file  . Transportation needs - non-medical: Not on file  Occupational History  . Not on file  Tobacco Use  . Smoking status: Never Smoker  . Smokeless tobacco: Never Used  Substance and Sexual Activity  . Alcohol use: No  . Drug use: No  . Sexual activity: No  Other Topics  Concern  . Not on file  Social History Narrative  . Not on file     Current Outpatient Medications:  .  ACCU-CHEK SOFTCLIX LANCETS lancets, , Disp: , Rfl:  .  atorvastatin (LIPITOR) 40 MG tablet, Take 1 tablet (40 mg total) at bedtime by mouth., Disp: 90 tablet, Rfl: 0 .  Blood Glucose Monitoring Suppl (ACCU-CHEK AVIVA) device, USE AS DIRECTED, Disp: 100 each, Rfl: 0 .  Blood Glucose Monitoring Suppl (ACCU-CHEK AVIVA) device, USE AS DIRECTED, Disp: 100 each, Rfl: 0 .  esomeprazole (NEXIUM) 20  MG capsule, Take 20 mg by mouth daily at 12 noon. , Disp: , Rfl:  .  glimepiride (AMARYL) 4 MG tablet, TAKE 1 TABLET BY MOUTH ONCE DAILY WITH  BREAKFAST, Disp: 90 tablet, Rfl: 0 .  glucose blood (ACCU-CHEK AVIVA PLUS) test strip, USE AS DIRECTED - may check glucose 1-2 times daily., Disp: 100 each, Rfl: 11 .  losartan-hydrochlorothiazide (HYZAAR) 50-12.5 MG tablet, Take 1 tablet daily by mouth., Disp: 90 tablet, Rfl: 0 .  metFORMIN (GLUCOPHAGE) 1000 MG tablet, Take 1 tablet (1,000 mg total) 2 (two) times daily with a meal by mouth., Disp: 180 tablet, Rfl: 0 .  sitaGLIPtin (JANUVIA) 50 MG tablet, Take 1 tablet (50 mg total) daily by mouth., Disp: 90 tablet, Rfl: 0  No Known Allergies   Review of Systems  Eyes: Negative for blurred vision (believes he has catarcts in the right eye.).  Respiratory: Negative for shortness of breath.   Cardiovascular: Negative for chest pain and palpitations.  Musculoskeletal: Negative for myalgias.  Neurological: Negative for dizziness and headaches.  Endo/Heme/Allergies: Negative for polydipsia.  Psychiatric/Behavioral: The patient is not nervous/anxious.      Objective  Vitals:   07/24/17 1346  BP: 124/78  Pulse: 100  Resp: 14  Temp: 97.7 F (36.5 C)  TempSrc: Oral  SpO2: 100%  Weight: 211 lb 4.8 oz (95.8 kg)    Physical Exam  Constitutional: He is oriented to person, place, and time and well-developed, well-nourished, and in no distress.  HENT:  Head: Normocephalic and atraumatic.  Cardiovascular: Normal rate, regular rhythm and normal heart sounds.  No murmur heard. Pulmonary/Chest: Effort normal and breath sounds normal. He has no wheezes.  Abdominal: Soft. Bowel sounds are normal.  Neurological: He is alert and oriented to person, place, and time.  Psychiatric: Mood, memory, affect and judgment normal.  Nursing note and vitals reviewed.       Assessment & Plan  1. Essential hypertension BP stable on present antihypertensive  treatment - losartan-hydrochlorothiazide (HYZAAR) 50-12.5 MG tablet; Take 1 tablet by mouth daily.  Dispense: 90 tablet; Refill: 0  2. Pure hypercholesterolemia  - atorvastatin (LIPITOR) 40 MG tablet; Take 1 tablet (40 mg total) by mouth at bedtime.  Dispense: 90 tablet; Refill: 0  3. Uncontrolled type 2 diabetes mellitus with stage 3 chronic kidney disease, without long-term current use of insulin (HCC) Obtain hemoglobin A1c and adjust medication if appropriate - glimepiride (AMARYL) 4 MG tablet; TAKE 1 TABLET BY MOUTH ONCE DAILY WITH  BREAKFAST  Dispense: 90 tablet; Refill: 0 - metFORMIN (GLUCOPHAGE) 1000 MG tablet; Take 1 tablet (1,000 mg total) by mouth 2 (two) times daily with a meal.  Dispense: 180 tablet; Refill: 0 - sitaGLIPtin (JANUVIA) 50 MG tablet; Take 1 tablet (50 mg total) by mouth daily.  Dispense: 90 tablet; Refill: 0   Paul Spears [facility] 07/24/2017 1:58 PM

## 2017-07-25 LAB — COMPLETE METABOLIC PANEL WITH GFR
AG Ratio: 1.6 (calc) (ref 1.0–2.5)
ALBUMIN MSPROF: 4.5 g/dL (ref 3.6–5.1)
ALKALINE PHOSPHATASE (APISO): 141 U/L — AB (ref 40–115)
ALT: 14 U/L (ref 9–46)
AST: 16 U/L (ref 10–35)
BUN/Creatinine Ratio: 15 (calc) (ref 6–22)
BUN: 21 mg/dL (ref 7–25)
CALCIUM: 9.6 mg/dL (ref 8.6–10.3)
CO2: 22 mmol/L (ref 20–32)
CREATININE: 1.42 mg/dL — AB (ref 0.70–1.18)
Chloride: 104 mmol/L (ref 98–110)
GFR, EST NON AFRICAN AMERICAN: 49 mL/min/{1.73_m2} — AB (ref >=60)
GFR, Est African American: 57 mL/min/{1.73_m2} — ABNORMAL LOW (ref >=60)
GLUCOSE: 120 mg/dL — AB (ref 65–99)
Globulin: 2.8 g/dL (calc) (ref 1.9–3.7)
Potassium: 4.7 mmol/L (ref 3.5–5.3)
Sodium: 139 mmol/L (ref 135–146)
Total Bilirubin: 0.6 mg/dL (ref 0.2–1.2)
Total Protein: 7.3 g/dL (ref 6.1–8.1)

## 2017-07-25 LAB — HEMOGLOBIN A1C
Hgb A1c MFr Bld: 7.5 % of total Hgb — ABNORMAL HIGH (ref <5.7)
Mean Plasma Glucose: 169 (calc)
eAG (mmol/L): 9.3 (calc)

## 2017-08-08 ENCOUNTER — Ambulatory Visit (INDEPENDENT_AMBULATORY_CARE_PROVIDER_SITE_OTHER): Payer: Medicare Other | Admitting: Family Medicine

## 2017-08-08 ENCOUNTER — Encounter: Payer: Self-pay | Admitting: Family Medicine

## 2017-08-08 ENCOUNTER — Encounter: Payer: Self-pay | Admitting: Emergency Medicine

## 2017-08-08 VITALS — BP 130/78 | HR 100 | Temp 97.7°F | Resp 18 | Ht 71.0 in | Wt 211.3 lb

## 2017-08-08 DIAGNOSIS — N183 Chronic kidney disease, stage 3 (moderate): Secondary | ICD-10-CM | POA: Diagnosis not present

## 2017-08-08 DIAGNOSIS — E1122 Type 2 diabetes mellitus with diabetic chronic kidney disease: Secondary | ICD-10-CM | POA: Diagnosis not present

## 2017-08-08 DIAGNOSIS — J069 Acute upper respiratory infection, unspecified: Secondary | ICD-10-CM

## 2017-08-08 DIAGNOSIS — E1165 Type 2 diabetes mellitus with hyperglycemia: Secondary | ICD-10-CM | POA: Diagnosis not present

## 2017-08-08 DIAGNOSIS — R748 Abnormal levels of other serum enzymes: Secondary | ICD-10-CM | POA: Diagnosis not present

## 2017-08-08 DIAGNOSIS — IMO0002 Reserved for concepts with insufficient information to code with codable children: Secondary | ICD-10-CM

## 2017-08-08 LAB — COMPLETE METABOLIC PANEL WITH GFR
AG Ratio: 1.6 (calc) (ref 1.0–2.5)
ALT: 13 U/L (ref 9–46)
AST: 14 U/L (ref 10–35)
Albumin: 4.2 g/dL (ref 3.6–5.1)
Alkaline phosphatase (APISO): 160 U/L — ABNORMAL HIGH (ref 40–115)
BILIRUBIN TOTAL: 0.5 mg/dL (ref 0.2–1.2)
BUN / CREAT RATIO: 12 (calc) (ref 6–22)
BUN: 18 mg/dL (ref 7–25)
CALCIUM: 9.4 mg/dL (ref 8.6–10.3)
CO2: 24 mmol/L (ref 20–32)
CREATININE: 1.46 mg/dL — AB (ref 0.70–1.18)
Chloride: 103 mmol/L (ref 98–110)
GFR, EST NON AFRICAN AMERICAN: 48 mL/min/{1.73_m2} — AB (ref >=60)
GFR, Est African American: 55 mL/min/{1.73_m2} — ABNORMAL LOW (ref >=60)
GLUCOSE: 154 mg/dL — AB (ref 65–99)
Globulin: 2.7 g/dL (calc) (ref 1.9–3.7)
Potassium: 4.6 mmol/L (ref 3.5–5.3)
Sodium: 138 mmol/L (ref 135–146)
TOTAL PROTEIN: 6.9 g/dL (ref 6.1–8.1)

## 2017-08-08 MED ORDER — FLUTICASONE PROPIONATE 50 MCG/ACT NA SUSP: 2.0000 | Freq: Every day | NASAL | 6 refills | Status: DC

## 2017-08-08 NOTE — Progress Notes (Addendum)
Name: Paul Spears   MRN: [id number]    DOB: [birth year]   Date:08/08/2017       Progress Note  Subjective  Chief Complaint  Chief Complaint  Patient presents with  . Follow-up    labwork  . URI    congested, scratchy throat, nose stopped up, cough    HPI  Lab Follow Up: Was seen by former PCP Dr. Manuella Ghazi 07/24/2017 - had labs drawn, alkaline phosphatase was elevated and we will recheck today per his recommendation.  He does have DM with CKD Stage III - kidney function appeared stable at last check.  He reports BG's in the 130-140 range.  URI: Notes 2-3 days of sneezing, scratchy throat, nasal congestion, and non-productive cough.  Denies chest pain, shortness of breath, fevers/chills, NVD.  No recent sick contacts.  He took OTC medication - he is unsure of the name.  Patient Active Problem List   Diagnosis Date Noted  . Pain in finger of left hand 10/23/2016  . DDD (degenerative disc disease), cervical 12/07/2015  . Muscle pain, cervical 12/04/2015  . Absent peripheral pulse 03/22/2015  . Abdominal pain, epigastric 09/21/2014  . GERD (gastroesophageal reflux disease) 09/21/2014  . Absolute anemia 09/21/2014  . Routine general medical examination at a health care facility 09/21/2014  . Barrett esophagus 09/21/2014  . Cervical osteoarthritis 09/21/2014  . Chronic kidney disease (CKD), stage III (moderate) ([place]) 09/21/2014  . Pain in shoulder 09/21/2014  . Uncontrolled type 2 diabetes with stage 3 chronic kidney disease GFR 30-59 ([place]) 09/21/2014  . Diverticulitis 09/21/2014  . Failure of erection 09/21/2014  . Esophagitis 09/21/2014  . High potassium 09/21/2014  . Flu vaccine need 09/21/2014  . Abnormal kidney function study 09/21/2014  . Microalbuminuria 09/21/2014  . Adiposity 09/21/2014  . Osteopenia 09/21/2014  . Pneumococcal vaccination given 09/21/2014  . Screening for depression 09/21/2014  . At risk for falling 09/21/2014  . Dermatitis seborrheica 09/21/2014  .  Breath shortness 09/21/2014  . Tenosynovitis 09/21/2014  . HTN (hypertension) 05/20/2014  . Hyperlipidemia 05/20/2014  . Dyspnea on exertion 05/20/2014    Past Surgical History:  Procedure Laterality Date  . CARPAL TUNNEL RELEASE Right   . ESOPHAGOGASTRODUODENOSCOPY (EGD) WITH PROPOFOL N/A 10/22/2016   Procedure: ESOPHAGOGASTRODUODENOSCOPY (EGD) WITH PROPOFOL;  Surgeon: Lollie Sails, MD;  Location: [facility] ENDOSCOPY;  Service: Endoscopy;  Laterality: N/A;  . FRACTURE SURGERY      Family History  Problem Relation Age of Onset  . Hypertension Father   . Heart attack Father   . Heart disease Sister   . Hypertension Sister   . Hyperlipidemia Sister     Social History   Socioeconomic History  . Marital status: Divorced    Spouse name: Not on file  . Number of children: Not on file  . Years of education: Not on file  . Highest education level: Not on file  Social Needs  . Financial resource strain: Not on file  . Food insecurity - worry: Not on file  . Food insecurity - inability: Not on file  . Transportation needs - medical: Not on file  . Transportation needs - non-medical: Not on file  Occupational History  . Not on file  Tobacco Use  . Smoking status: Never Smoker  . Smokeless tobacco: Never Used  Substance and Sexual Activity  . Alcohol use: No  . Drug use: No  . Sexual activity: No  Other Topics Concern  . Not on file  Social History Narrative  .  Not on file     Current Outpatient Medications:  .  ACCU-CHEK SOFTCLIX LANCETS lancets, , Disp: , Rfl:  .  atorvastatin (LIPITOR) 40 MG tablet, Take 1 tablet (40 mg total) by mouth at bedtime., Disp: 90 tablet, Rfl: 0 .  Blood Glucose Monitoring Suppl (ACCU-CHEK AVIVA) device, USE AS DIRECTED, Disp: 100 each, Rfl: 0 .  Blood Glucose Monitoring Suppl (ACCU-CHEK AVIVA) device, USE AS DIRECTED, Disp: 100 each, Rfl: 0 .  esomeprazole (NEXIUM) 20 MG capsule, Take 20 mg by mouth daily at 12 noon. , Disp: , Rfl:  .   glimepiride (AMARYL) 4 MG tablet, TAKE 1 TABLET BY MOUTH ONCE DAILY WITH  BREAKFAST, Disp: 90 tablet, Rfl: 0 .  glucose blood (ACCU-CHEK AVIVA PLUS) test strip, USE AS DIRECTED - may check glucose 1-2 times daily., Disp: 100 each, Rfl: 11 .  losartan-hydrochlorothiazide (HYZAAR) 50-12.5 MG tablet, Take 1 tablet by mouth daily., Disp: 90 tablet, Rfl: 0 .  metFORMIN (GLUCOPHAGE) 1000 MG tablet, Take 1 tablet (1,000 mg total) by mouth 2 (two) times daily with a meal., Disp: 180 tablet, Rfl: 0 .  sitaGLIPtin (JANUVIA) 50 MG tablet, Take 1 tablet (50 mg total) by mouth daily., Disp: 90 tablet, Rfl: 0  No Known Allergies  ROS Constitutional: Negative for fever or weight change.  Respiratory: Positive for cough; negative for shortness of breath.   Cardiovascular: Negative for chest pain or palpitations.  Gastrointestinal: Negative for abdominal pain, no bowel changes.  Musculoskeletal: Negative for gait problem or joint swelling.  Skin: Negative for rash.  Neurological: Negative for dizziness or headache.  No other specific complaints in a complete review of systems (except as listed in HPI above).  Objective  Vitals:   08/08/17 0920  BP: 130/78  Pulse: 100  Resp: 18  Temp: 97.7 F (36.5 C)  TempSrc: Oral  SpO2: 97%  Weight: 211 lb 4.8 oz (95.8 kg)  Height: 5\' 11"  (1.478 m)   Body mass index is 29.47 kg/m.  Mildly tachycardic at 100bpm on auscultation.  Physical Exam Constitutional: Patient appears well-developed and well-nourished. Overweight. No distress.  HEENT: head atraumatic, normocephalic, pupils equal and reactive to light, TM's with good light reflex, no bulging or erythema, neck supple without lymphadenopathy, OP mildly erythematous and without exudate Cardiovascular: Normal rate, regular rhythm and normal heart sounds.  No murmur heard. No BLE edema. Pulmonary/Chest: Effort normal and breath sounds normal. No respiratory distress. Abdominal: Soft.  There is no  tenderness. Psychiatric: Patient has a normal mood and affect. behavior is normal. Judgment and thought content normal.  No results found for this or any previous visit (from the past 72 hour(s)).  PHQ2/9: Depression screen [facility] 2/9 07/24/2017 07/10/2017 04/22/2017 01/15/2017 10/15/2016  Decreased Interest 0 0 0 0 0  Down, Depressed, Hopeless 0 0 0 0 0  PHQ - 2 Score 0 0 0 0 0    Fall Risk: Fall Risk  07/24/2017 07/10/2017 04/22/2017 01/15/2017 10/15/2016  Falls in the past year? No No No No No   Assessment & Plan  1. Upper respiratory tract infection, unspecified type - fluticasone (FLONASE) 50 MCG/ACT nasal spray; Place 2 sprays into both nostrils daily.  Dispense: 16 g; Refill: 6 - Pt declines cough medication or antihistamine at this time.  Will try flonase alone.  Advised likely viral as symptoms are mild and have only been present for about 2 days. - Advised to drink plenty of fluids.  2. Uncontrolled type 2 diabetes with stage 3 chronic  kidney disease GFR 30-59 (HCC) - COMPLETE METABOLIC PANEL WITH GFR  3. Elevated alkaline phosphatase level - COMPLETE METABOLIC PANEL WITH GFR

## 2017-08-08 NOTE — Patient Instructions (Addendum)
Please schedule your eye exam as soon as possible.   Upper Respiratory Infection, Adult Most upper respiratory infections (URIs) are caused by a virus. A URI affects the nose, throat, and upper air passages. The most common type of URI is often called "the common cold." Follow these instructions at home:  Take medicines only as told by your doctor.  Gargle warm saltwater or take cough drops to comfort your throat as told by your doctor.  Use a warm mist humidifier or inhale steam from a shower to increase air moisture. This may make it easier to breathe.  Drink enough fluid to keep your pee (urine) clear or pale yellow.  Eat soups and other clear broths.  Have a healthy diet.  Rest as needed.  Go back to work when your fever is gone or your doctor says it is okay. ? You may need to stay home longer to avoid giving your URI to others. ? You can also wear a face mask and wash your hands often to prevent spread of the virus.  Use your inhaler more if you have asthma.  Do not use any tobacco products, including cigarettes, chewing tobacco, or electronic cigarettes. If you need help quitting, ask your doctor. Contact a doctor if:  You are getting worse, not better.  Your symptoms are not helped by medicine.  You have chills.  You are getting more short of breath.  You have brown or red mucus.  You have yellow or brown discharge from your nose.  You have pain in your face, especially when you bend forward.  You have a fever.  You have puffy (swollen) neck glands.  You have pain while swallowing.  You have white areas in the back of your throat. Get help right away if:  You have very bad or constant: ? Headache. ? Ear pain. ? Pain in your forehead, behind your eyes, and over your cheekbones (sinus pain). ? Chest pain.  You have long-lasting (chronic) lung disease and any of the following: ? Wheezing. ? Long-lasting cough. ? Coughing up blood. ? A change in your  usual mucus.  You have a stiff neck.  You have changes in your: ? Vision. ? Hearing. ? Thinking. ? Mood. This information is not intended to replace advice given to you by your health care provider. Make sure you discuss any questions you have with your health care provider. Document Released: 11/20/2007 Document Revised: 02/04/2016 Document Reviewed: 09/08/2013 Elsevier Interactive Patient Education  2018 [employer].

## 2017-10-28 ENCOUNTER — Encounter: Payer: Self-pay | Admitting: Family Medicine

## 2017-10-28 ENCOUNTER — Ambulatory Visit (INDEPENDENT_AMBULATORY_CARE_PROVIDER_SITE_OTHER): Payer: Medicare Other | Admitting: Family Medicine

## 2017-10-28 VITALS — BP 122/76 | HR 91 | Temp 98.3°F | Resp 14 | Ht 71.0 in | Wt 207.6 lb

## 2017-10-28 DIAGNOSIS — Z9181 History of falling: Secondary | ICD-10-CM | POA: Diagnosis not present

## 2017-10-28 DIAGNOSIS — N183 Chronic kidney disease, stage 3 unspecified: Secondary | ICD-10-CM

## 2017-10-28 DIAGNOSIS — E1165 Type 2 diabetes mellitus with hyperglycemia: Secondary | ICD-10-CM

## 2017-10-28 DIAGNOSIS — D649 Anemia, unspecified: Secondary | ICD-10-CM

## 2017-10-28 DIAGNOSIS — R0989 Other specified symptoms and signs involving the circulatory and respiratory systems: Secondary | ICD-10-CM

## 2017-10-28 DIAGNOSIS — E1122 Type 2 diabetes mellitus with diabetic chronic kidney disease: Secondary | ICD-10-CM | POA: Diagnosis not present

## 2017-10-28 DIAGNOSIS — R1011 Right upper quadrant pain: Secondary | ICD-10-CM

## 2017-10-28 DIAGNOSIS — K22719 Barrett's esophagus with dysplasia, unspecified: Secondary | ICD-10-CM

## 2017-10-28 DIAGNOSIS — G8929 Other chronic pain: Secondary | ICD-10-CM

## 2017-10-28 DIAGNOSIS — H538 Other visual disturbances: Secondary | ICD-10-CM | POA: Diagnosis not present

## 2017-10-28 DIAGNOSIS — I10 Essential (primary) hypertension: Secondary | ICD-10-CM

## 2017-10-28 DIAGNOSIS — M25511 Pain in right shoulder: Secondary | ICD-10-CM

## 2017-10-28 DIAGNOSIS — J069 Acute upper respiratory infection, unspecified: Secondary | ICD-10-CM

## 2017-10-28 DIAGNOSIS — R748 Abnormal levels of other serum enzymes: Secondary | ICD-10-CM

## 2017-10-28 DIAGNOSIS — E559 Vitamin D deficiency, unspecified: Secondary | ICD-10-CM

## 2017-10-28 DIAGNOSIS — E782 Mixed hyperlipidemia: Secondary | ICD-10-CM

## 2017-10-28 DIAGNOSIS — IMO0002 Reserved for concepts with insufficient information to code with codable children: Secondary | ICD-10-CM

## 2017-10-28 MED ORDER — FLUTICASONE PROPIONATE 50 MCG/ACT NA SUSP: 2.0000 | Freq: Every day | NASAL | 11 refills | Status: DC | PRN

## 2017-10-28 NOTE — Assessment & Plan Note (Signed)
Avoid NSAIDs, increase water, check Cr

## 2017-10-28 NOTE — Assessment & Plan Note (Signed)
Check CBC today and ferritin

## 2017-10-28 NOTE — Progress Notes (Signed)
BP 122/76   Pulse 91   Temp 98.3 F (36.8 C) (Oral)   Resp 14   Ht _0  (1.803 m)   Wt 207 lb 9.6 oz (94.2 kg)   SpO2 95%   BMI 28.95 kg/m    Subjective:    Patient ID: Paul Spears, male    DOB: [birth year], 72 y.o.   MRN: [id number]  HPI: Paul Spears is a 72 y.o. male  Chief Complaint  Patient presents with  . Follow-up    former [facility] pt  . Medication Refill  . Cough    onset 3 weeks  . Leg Pain    some mornings when he wakes he feels like he gets a cramp in the calf of right leg    HPI Patient is new to me; previous provider left our practice Type 2 DM; about 10 years; no one else in the family; scratch on right foot; hits bed post once in a while; checking FSBS every morning, sometimes later in the day if not feeling well; range over last 2 weeks; 150-185 or so Lab Results  Component Value Date   HGBA1C 7.5 (H) 07/24/2017   High cholesterol; on statin, atorvastatin 40 mg Lab Results  Component Value Date   CHOL 152 04/22/2017   HDL 41 04/22/2017   LDLCALC 89 04/22/2017   TRIG 120 04/22/2017   CHOLHDL 3.7 04/22/2017   Arthritis in the right shoulder; had a shot in it, 3-4 years ago, worked for 1 week or so; has kidney problems CKD stage 3; may consider NSAID; diet Coke; not much water; no fam hx of kidney disease  Barrett's esophagus, taking nexium; seen at [facility]; monitored by them (GI)  Cough for 3 weeks; he thinks it is just allergies; got into poison ivy weed eating; started to get better; mowed the yard again and came back; just a little productve, specks of mucous; taking mucinex type medicine, alka seltzer before; no fevers; no ear problems; no real sinus problems  Labs reviewed; high alk phos; hx of vitamin D deficiency  Asked about RUQ pain; was told he has a muscle, pokes out  Overweight; lost 4 pounds, not eating as much as he died; two meals a day; decreased appetite; gets dizzy every once in a while  Saw the vascular doctor for  decreased pulse, but everything is okay; he sees varicose veins; thought about doing screening; offered ABI  Anemia noted in the problem list; does feel tired  Fall Risk  10/28/2017 07/24/2017 07/10/2017 04/22/2017 01/15/2017  Falls in the past year? Yes No No No No  Number falls in past yr: 1 - - - -  Injury with Fall? No - - - -   Blood sugar got low, fell against the wall; not sure what it was; nothing since; 4 weeks ago  Depression screen [facility] 2/9 10/28/2017 07/24/2017 07/10/2017 04/22/2017 01/15/2017  Decreased Interest 0 0 0 0 0  Down, Depressed, Hopeless 0 0 0 0 0  PHQ - 2 Score 0 0 0 0 0    Relevant past medical, surgical, family and social history reviewed Past Medical History:  Diagnosis Date  . Diabetes mellitus without complication ([place])   . Hyperlipidemia   . Hypertension   . Kidney disease, chronic, stage III (GFR 30-59 ml/min) (HCC)    Past Surgical History:  Procedure Laterality Date  . CARPAL TUNNEL RELEASE Right   . ESOPHAGOGASTRODUODENOSCOPY (EGD) WITH PROPOFOL N/A 10/22/2016   Procedure: ESOPHAGOGASTRODUODENOSCOPY (EGD)  WITH PROPOFOL;  Surgeon: Lollie Sails, MD;  Location: [facility] ENDOSCOPY;  Service: Endoscopy;  Laterality: N/A;  . FRACTURE SURGERY     Family History  Problem Relation Age of Onset  . Hypertension Father   . Heart attack Father   . Heart disease Sister   . Hypertension Sister   . Hyperlipidemia Sister    Social History   Tobacco Use  . Smoking status: Never Smoker  . Smokeless tobacco: Never Used  Substance Use Topics  . Alcohol use: No  . Drug use: No    Interim medical history since last visit reviewed. Allergies and medications reviewed  Review of Systems Per HPI unless specifically indicated above     Objective:    BP 122/76   Pulse 91   Temp 98.3 F (36.8 C) (Oral)   Resp 14   Ht _0  (1.803 m)   Wt 207 lb 9.6 oz (94.2 kg)   SpO2 95%   BMI 28.95 kg/m   Wt Readings from Last 3 Encounters:  10/28/17 207 lb 9.6 oz (94.2  kg)  08/08/17 211 lb 4.8 oz (95.8 kg)  07/24/17 211 lb 4.8 oz (95.8 kg)    Physical Exam  Constitutional: He appears well-developed and well-nourished. No distress.  HENT:  Head: Normocephalic and atraumatic.  Eyes: EOM are normal. No scleral icterus.  Neck: No thyromegaly present.  Cardiovascular: Normal rate and regular rhythm.  Pulmonary/Chest: Effort normal and breath sounds normal.  Abdominal: Soft. Bowel sounds are normal. He exhibits no distension. There is tenderness in the right upper quadrant.  Musculoskeletal: He exhibits no edema.       Right lower leg: He exhibits no swelling and no edema.  Few varicosities on right calf; no erythema  Neurological: Coordination normal.  Skin: Skin is warm and dry. No pallor.     Single scratch on the dorsum of the RIGHT foot; no fluctuance, no proximal erythema  Psychiatric: He has a normal mood and affect. His behavior is normal. Judgment and thought content normal.   Diabetic Foot Form - Detailed   Diabetic Foot Exam - detailed Diabetic Foot exam was performed with the following findings:  Yes 10/28/2017 11:20 AM  Visual Foot Exam completed.:  Yes  Pulse Foot Exam completed.:  Yes  Right Dorsalis Pedis:  Present Left Dorsalis Pedis:  Present  Sensory Foot Exam Completed.:  Yes Semmes-Weinstein Monofilament Test R Site 1-Great Toe:  Pos L Site 1-Great Toe:  Pos        Results for orders placed or performed in visit on 08/08/17  COMPLETE METABOLIC PANEL WITH GFR  Result Value Ref Range   Glucose, Bld 154 (H) 65 - 99 mg/dL   BUN 18 7 - 25 mg/dL   Creat 1.46 (H) 0.70 - 1.18 mg/dL   GFR, Est Non African American 48 (L) > OR = 60 mL/min/1.22m   GFR, Est African American 55 (L) > OR = 60 mL/min/1.754m  BUN/Creatinine Ratio 12 6 - 22 (calc)   Sodium 138 135 - 146 mmol/L   Potassium 4.6 3.5 - 5.3 mmol/L   Chloride 103 98 - 110 mmol/L   CO2 24 20 - 32 mmol/L   Calcium 9.4 8.6 - 10.3 mg/dL   Total Protein 6.9 6.1 - 8.1 g/dL    Albumin 4.2 3.6 - 5.1 g/dL   Globulin 2.7 1.9 - 3.7 g/dL (calc)   AG Ratio 1.6 1.0 - 2.5 (calc)   Total Bilirubin 0.5 0.2 - 1.2  mg/dL   Alkaline phosphatase (APISO) 160 (H) 40 - 115 U/L   AST 14 10 - 35 U/L   ALT 13 9 - 46 U/L      Assessment & Plan:   Problem List Items Addressed This Visit      Cardiovascular and Mediastinum   HTN (hypertension)    Controlled today; continue medicines; try DASH guidelines        Digestive   Barrett esophagus    Mon[facility]; continue PPI        Endocrine   Uncontrolled type 2 diabetes with stage 3 chronic kidney disease GFR 30-59 ([place]) - Primary    Foot exam by MD today; check labs; refer to eye doctor; limit sugars      Relevant Orders   Urine Microalbumin w/creat. ratio   Ambulatory referral to Ophthalmology   Lipid panel   Hemoglobin A1c     Genitourinary   Chronic kidney disease (CKD), stage III (moderate) (HCC)    Avoid NSAIDs, increase water, check Cr        Other   Pain in shoulder    Advise against NSAIDs; use turmeric and topicals and tylenol per package directions      Hyperlipidemia    Check lipids today; limit saturated fats      High alkaline phosphatase    Check labs today and get RUQ [place]      Relevant Orders   COMPLETE METABOLIC PANEL WITH GFR   Alkaline Phosphatase Isoenzymes   At risk for falling    Try to make sure not skipping meals      Absolute anemia    Check CBC today and ferritin      Relevant Orders   CBC with Differential/Platelet   Fe+TIBC+Fer   Absent peripheral pulse    Offered ABI; he will watch for now       Other Visit Diagnoses    Cloudy vision       Relevant Orders   Ambulatory referral to Ophthalmology   Upper respiratory tract infection, unspecified type       Relevant Medications   fluticasone (FLONASE) 50 MCG/ACT nasal spray   Vitamin D deficiency       Relevant Orders   VITAMIN D 25 Hydroxy (Vit-D Deficiency, Fractures)   RUQ pain       Relevant  Orders   US ABDOMEN LIMITED RUQ       Follow up plan: Return in about 3 months (around 01/28/2018) for follow-up visit with Dr. Sanda Klein.  An after-visit summary was printed and given to the patient at [facility].  Please see the patient instructions which may contain other information and recommendations beyond what is mentioned above in the assessment and plan.  Meds ordered this encounter  Medications  . fluticasone (FLONASE) 50 MCG/ACT nasal spray    Sig: Place 2 sprays into both nostrils daily as needed.    Dispense:  16 g    Refill:  11    Orders Placed This Encounter  Procedures  . US ABDOMEN LIMITED RUQ  . Urine Microalbumin w/creat. ratio  . Lipid panel  . Hemoglobin A1c  . COMPLETE METABOLIC PANEL WITH GFR  . Alkaline Phosphatase Isoenzymes  . VITAMIN D 25 Hydroxy (Vit-D Deficiency, Fractures)  . CBC with Differential/Platelet  . Fe+TIBC+Fer  . Ambulatory referral to Ophthalmology

## 2017-10-28 NOTE — Assessment & Plan Note (Signed)
Foot exam by MD today; check labs; refer to eye doctor; limit sugars

## 2017-10-28 NOTE — Assessment & Plan Note (Signed)
Try to make sure not skipping meals

## 2017-10-28 NOTE — Assessment & Plan Note (Signed)
Controlled today; continue medicines; try DASH guidelines

## 2017-10-28 NOTE — Assessment & Plan Note (Signed)
Advise against NSAIDs; use turmeric and topicals and tylenol per package directions

## 2017-10-28 NOTE — Assessment & Plan Note (Signed)
Offered ABI; he will watch for now

## 2017-10-28 NOTE — Assessment & Plan Note (Signed)
Check labs today and get RUQ [place]

## 2017-10-28 NOTE — Assessment & Plan Note (Signed)
>>  ASSESSMENT AND PLAN FOR CHRONIC KIDNEY DISEASE (CKD), STAGE III (MODERATE) (HCC) WRITTEN ON 10/28/2017 10:14 AM BY LADA, MELINDA P, MD  Avoid NSAIDs, increase water, check Cr

## 2017-10-28 NOTE — Patient Instructions (Addendum)
If you need something for aches or pains, try to use Tylenol (acetaminophen) instead of non-steroidals (which include Aleve, ibuprofen, Advil, Motrin, Goody's, BC powders, and naproxen); non-steroidals can cause long-term kidney damage  Try to follow the DASH guidelines (DASH stands for Dietary Approaches to Stop Hypertension). Try to limit the sodium in your diet to no more than 1,500mg  of sodium per day. Certainly try to not exceed 2,000 mg per day at the very most. Do not add salt when cooking or at the table.  Check the sodium amount on labels when shopping, and choose items lower in sodium when given a choice. Avoid or limit foods that already contain a lot of sodium. Eat a diet rich in fruits and vegetables and whole grains, and try to lose weight if overweight or obese  Start back nasal spray  Limit foods that come from cows or pigs DASH Eating Plan DASH stands for "Dietary Approaches to Stop Hypertension." The DASH eating plan is a healthy eating plan that has been shown to reduce high blood pressure (hypertension). It may also reduce your risk for type 2 diabetes, heart disease, and stroke. The DASH eating plan may also help with weight loss. What are tips for following this plan? General guidelines  Avoid eating more than 2,300 mg (milligrams) of salt (sodium) a day. If you have hypertension, you may need to reduce your sodium intake to 1,500 mg a day.  Limit alcohol intake to no more than 1 drink a day for nonpregnant women and 2 drinks a day for men. One drink equals 12 oz of beer, 5 oz of wine, or 1 oz of hard liquor.  Work with your health care provider to maintain a healthy body weight or to lose weight. Ask what an ideal weight is for you.  Get at least 30 minutes of exercise that causes your heart to beat faster (aerobic exercise) most days of the week. Activities may include walking, swimming, or biking.  Work with your health care provider or diet and nutrition specialist  (dietitian) to adjust your eating plan to your individual calorie needs. Reading food labels  Check food labels for the amount of sodium per serving. Choose foods with less than 5 percent of the Daily Value of sodium. Generally, foods with less than 300 mg of sodium per serving fit into this eating plan.  To find whole grains, look for the word "whole" as the first word in the ingredient list. Shopping  Buy products labeled as "low-sodium" or "no salt added."  Buy fresh foods. Avoid canned foods and premade or frozen meals. Cooking  Avoid adding salt when cooking. Use salt-free seasonings or herbs instead of table salt or sea salt. Check with your health care provider or pharmacist before using salt substitutes.  Do not fry foods. Cook foods using healthy methods such as baking, boiling, grilling, and broiling instead.  Cook with heart-healthy oils, such as olive, canola, soybean, or sunflower oil. Meal planning   Eat a balanced diet that includes: ? 5 or more servings of fruits and vegetables each day. At each meal, try to fill half of your plate with fruits and vegetables. ? Up to 6-8 servings of whole grains each day. ? Less than 6 oz of lean meat, poultry, or fish each day. A 3-oz serving of meat is about the same size as a deck of cards. One egg equals 1 oz. ? 2 servings of low-fat dairy each day. ? A serving of nuts, seeds,  or beans 5 times each week. ? Heart-healthy fats. Healthy fats called Omega-3 fatty acids are found in foods such as flaxseeds and coldwater fish, like sardines, salmon, and mackerel.  Limit how much you eat of the following: ? Canned or prepackaged foods. ? Food that is high in trans fat, such as fried foods. ? Food that is high in saturated fat, such as fatty meat. ? Sweets, desserts, sugary drinks, and other foods with added sugar. ? Full-fat dairy products.  Do not salt foods before eating.  Try to eat at least 2 vegetarian meals each week.  Eat  more home-cooked food and less restaurant, buffet, and fast food.  When eating at a restaurant, ask that your food be prepared with less salt or no salt, if possible. What foods are recommended? The items listed may not be a complete list. Talk with your dietitian about what dietary choices are best for you. Grains Whole-grain or whole-wheat bread. Whole-grain or whole-wheat pasta. Brown rice. [place]. Bulgur. Whole-grain and low-sodium cereals. Pita bread. Low-fat, low-sodium crackers. Whole-wheat flour tortillas. Vegetables Fresh or frozen vegetables (raw, steamed, roasted, or grilled). Low-sodium or reduced-sodium tomato and vegetable juice. Low-sodium or reduced-sodium tomato sauce and tomato paste. Low-sodium or reduced-sodium canned vegetables. Fruits All fresh, dried, or frozen fruit. Canned fruit in natural juice (without added sugar). Meat and other protein foods Skinless chicken or [place]. Ground chicken or [place]. Pork with fat trimmed off. Fish and seafood. Egg whites. Dried beans, peas, or lentils. Unsalted nuts, nut butters, and seeds. Unsalted canned beans. Lean cuts of beef with fat trimmed off. Low-sodium, lean deli meat. Dairy Low-fat (1%) or fat-free (skim) milk. Fat-free, low-fat, or reduced-fat cheeses. Nonfat, low-sodium ricotta or cottage cheese. Low-fat or nonfat yogurt. Low-fat, low-sodium cheese. Fats and oils Soft margarine without trans fats. Vegetable oil. Low-fat, reduced-fat, or light mayonnaise and salad dressings (reduced-sodium). Canola, safflower, olive, soybean, and sunflower oils. Avocado. Seasoning and other foods Herbs. Spices. Seasoning mixes without salt. Unsalted popcorn and pretzels. Fat-free sweets. What foods are not recommended? The items listed may not be a complete list. Talk with your dietitian about what dietary choices are best for you. Grains Baked goods made with fat, such as croissants, muffins, or some breads. Dry pasta or rice meal  packs. Vegetables Creamed or fried vegetables. Vegetables in a cheese sauce. Regular canned vegetables (not low-sodium or reduced-sodium). Regular canned tomato sauce and paste (not low-sodium or reduced-sodium). Regular tomato and vegetable juice (not low-sodium or reduced-sodium). Angie Fava. Olives. Fruits Canned fruit in a light or heavy syrup. Fried fruit. Fruit in cream or butter sauce. Meat and other protein foods Fatty cuts of meat. Ribs. Fried meat. Berniece Salines. Sausage. Bologna and other processed lunch meats. Salami. Fatback. Hotdogs. Bratwurst. Salted nuts and seeds. Canned beans with added salt. Canned or smoked fish. Whole eggs or egg yolks. Chicken or [place] with skin. Dairy Whole or 2% milk, cream, and half-and-half. Whole or full-fat cream cheese. Whole-fat or sweetened yogurt. Full-fat cheese. Nondairy creamers. Whipped toppings. Processed cheese and cheese spreads. Fats and oils Butter. Stick margarine. Lard. Shortening. Ghee. Bacon fat. Tropical oils, such as coconut, palm kernel, or palm oil. Seasoning and other foods Salted popcorn and pretzels. Onion salt, garlic salt, seasoned salt, table salt, and sea salt. Worcestershire sauce. Tartar sauce. Barbecue sauce. Teriyaki sauce. Soy sauce, including reduced-sodium. Steak sauce. Canned and packaged gravies. Fish sauce. Oyster sauce. Cocktail sauce. Horseradish that you find on the shelf. Ketchup. Mustard. Meat flavorings and tenderizers.  Bouillon cubes. Hot sauce and Tabasco sauce. Premade or packaged marinades. Premade or packaged taco seasonings. Relishes. Regular salad dressings. Where to find more information:  National Heart, Lung, and [place]: https://wilson-eaton.com/  American Heart Association: www.heart.org Summary  The DASH eating plan is a healthy eating plan that has been shown to reduce high blood pressure (hypertension). It may also reduce your risk for type 2 diabetes, heart disease, and stroke.  With the DASH eating  plan, you should limit salt (sodium) intake to 2,300 mg a day. If you have hypertension, you may need to reduce your sodium intake to 1,500 mg a day.  When on the DASH eating plan, aim to eat more fresh fruits and vegetables, whole grains, lean proteins, low-fat dairy, and heart-healthy fats.  Work with your health care provider or diet and nutrition specialist (dietitian) to adjust your eating plan to your individual calorie needs. This information is not intended to replace advice given to you by your health care provider. Make sure you discuss any questions you have with your health care provider. Document Released: 05/23/2011 Document Revised: 05/27/2016 Document Reviewed: 05/27/2016 Elsevier Interactive Patient Education  2018 [place].  Chronic Kidney Disease, Adult Chronic kidney disease (CKD) occurs when the kidneys become damaged slowly over a long period of time. The kidneys are a pair of organs that do many important jobs in the body, including:  Removing waste and extra fluid from the blood to make urine.  Making hormones that maintain the amount of fluid in tissues and blood vessels.  Maintaining the right amount of fluids and chemicals in the body.  A small amount of kidney damage may not cause problems, but a large amount of damage may make it hard or impossible for the kidneys to work the way they should. If steps are not taken to slow down kidney damage or to stop it from getting worse, the kidneys may stop working permanently (end-stage renal disease or ESRD). Most of the time, CKD does not go away, but it can often be controlled. People who have CKD are usually able to live normal lives. What are the causes? The most common causes of this condition are diabetes and high blood pressure (hypertension). Other causes include:  Heart and blood vessel (cardiovascular) disease.  Kidney diseases, such as: ? Glomerulonephritis. ? Interstitial nephritis. ? Polycystic kidney  disease. ? Renal vascular disease.  Diseases that affect the immune system.  Genetic diseases.  Medicines that damage the kidneys, such as anti-inflammatory medicines.  Being around or being in contact with poisonous (toxic) substances.  A kidney or urinary infection that occurs again and again (recurs).  Vasculitis. This is swelling or inflammation of the blood vessels.  A problem with urine flow that may be caused by: ? Cancer. ? Having kidney stones more than one time. ? An enlarged prostate, in males.  What increases the risk? You are more likely to develop this condition if you:  Are older than age 42.  Are male.  Are African-American, Hispanic, Asian, [place], or American [place].  Are a current or former smoker.  Are obese.  Have a family history of kidney disease or failure.  Often take medicines that are damaging to the kidneys.  What are the signs or symptoms? Symptoms of this condition include:  Swelling (edema) of the face, legs, ankles, or feet.  Tiredness (lethargy) and having less energy.  Nausea or vomiting.  Confusion or trouble concentrating.  Problems with urination, such as: ?  Painful or burning feeling during urination. ? Decreased urine production. ? Frequent urination, especially at night. ? Bloody urine.  Muscle twitches and cramps, especially in the legs.  Shortness of breath.  Weakness.  Loss of appetite.  Metallic taste in the mouth.  Trouble sleeping.  Dry, itchy skin.  A low blood count (anemia).  Pale lining of the eyelids and surface of the eye (conjunctiva).  Symptoms develop slowly and may not be obvious until the kidney damage becomes severe. It is possible to have kidney disease for years without having any symptoms. How is this diagnosed? This condition may be diagnosed based on:  Blood tests.  Urine tests.  Imaging tests, such as an ultrasound or CT scan.  A test in which a sample of  tissue is removed from the kidneys to be examined under a microscope (kidney biopsy).  These test results will help your health care provider determine how serious the CKD is. How is this treated? There is no cure for most cases of this condition, but treatment usually relieves symptoms and prevents or slows the progression of the disease. Treatment may include:  Making diet changes, which may require you to avoid alcohol, salty foods (sodium), and foods that are high in potassium, calcium, and protein.  Medicines: ? To lower blood pressure. ? To control blood glucose. ? To relieve anemia. ? To relieve swelling. ? To protect your bones. ? To improve the balance of electrolytes in your blood.  Removing toxic waste from the body through types of dialysis, if the kidneys can no longer do their job (kidney failure).  Managing any other conditions that are causing your CKD or making it worse.  Follow these instructions at home: Medicines  Take over-the-counter and prescription medicines only as told by your health care provider. The dose of some medicines that you take may need to be adjusted.  Do not take any new medicines unless approved by your health care provider. Many medicines can worsen your kidney damage.  Do not take any vitamin and mineral supplements unless approved by your health care provider. Many nutritional supplements can worsen your kidney damage. General instructions  Follow your prescribed diet as told by your health care provider.  Do not use any products that contain nicotine or tobacco, such as cigarettes and e-cigarettes. If you need help quitting, ask your health care provider.  Monitor and track your blood pressure at home. Report changes in your blood pressure as told by your health care provider.  If you are being treated for diabetes, monitor and track your blood sugar (blood glucose) levels as told by your health care provider.  Maintain a healthy  weight. If you need help with this, ask your health care provider.  Start or continue an exercise plan. Exercise at least 30 minutes a day, 5 days a week.  Keep your immunizations up to date as told by your health care provider.  Keep all follow-up visits as told by your health care provider. This is important. Where to find more information:  American Association of Kidney Patients: BombTimer.gl  National Kidney Foundation: www.kidney.[place]: https://mathis.com/  [facility]: www.lifeoptions.org and www.kidneyschool.org Contact a health care provider if:  Your symptoms get worse.  You develop new symptoms. Get help right away if:  You develop symptoms of ESRD, which include: ? Headaches. ? Numbness in the hands or feet. ? Easy bruising. ? Frequent hiccups. ? Chest pain. ? Shortness of breath. ?  Lack of menstruation, in women.  You have a fever.  You have decreased urine production.  You have pain or bleeding when you urinate. Summary  Chronic kidney disease (CKD) occurs when the kidneys become damaged slowly over a long period of time.  The most common causes of this condition are diabetes and high blood pressure (hypertension).  There is no cure for most cases of this condition, but treatment usually relieves symptoms and prevents or slows the progression of the disease. Treatment may include a combination of medicines and lifestyle changes. This information is not intended to replace advice given to you by your health care provider. Make sure you discuss any questions you have with your health care provider. Document Released: 03/12/2008 Document Revised: 07/11/2016 Document Reviewed: 07/11/2016 Elsevier Interactive Patient Education  [employer].

## 2017-10-28 NOTE — Assessment & Plan Note (Signed)
Monitored by GI at [facility]; continue PPI

## 2017-10-28 NOTE — Assessment & Plan Note (Signed)
Check lipids today; limit saturated fats 

## 2017-10-29 ENCOUNTER — Telehealth: Payer: Self-pay

## 2017-10-29 ENCOUNTER — Other Ambulatory Visit: Payer: Self-pay | Admitting: Family Medicine

## 2017-10-29 DIAGNOSIS — D509 Iron deficiency anemia, unspecified: Secondary | ICD-10-CM

## 2017-10-29 DIAGNOSIS — E1122 Type 2 diabetes mellitus with diabetic chronic kidney disease: Secondary | ICD-10-CM

## 2017-10-29 DIAGNOSIS — N183 Chronic kidney disease, stage 3 (moderate): Principal | ICD-10-CM

## 2017-10-29 DIAGNOSIS — E782 Mixed hyperlipidemia: Secondary | ICD-10-CM

## 2017-10-29 DIAGNOSIS — E1165 Type 2 diabetes mellitus with hyperglycemia: Principal | ICD-10-CM

## 2017-10-29 DIAGNOSIS — IMO0002 Reserved for concepts with insufficient information to code with codable children: Secondary | ICD-10-CM

## 2017-10-29 MED ORDER — ATORVASTATIN CALCIUM 80 MG PO TABS: 80.0000 mg | ORAL_TABLET | Freq: Every day | ORAL | 0 refills | Status: DC

## 2017-10-29 MED ORDER — DULAGLUTIDE 0.75 MG/0.5ML ~~LOC~~ SOAJ: SUBCUTANEOUS | 2 refills | Status: DC

## 2017-10-29 MED ORDER — VITAMIN D (ERGOCALCIFEROL) 1.25 MG (50000 UNIT) PO CAPS: 50000.0000 [IU] | ORAL_CAPSULE | ORAL | 1 refills | Status: AC

## 2017-10-29 NOTE — Telephone Encounter (Signed)
-----   Message from Arnetha Courser, MD sent at 10/29/2017  2:23 PM EDT ----- Paul Spears, please let pt know that he is not spilling excessive protein through his kidneys, good news His LDL is too high, so let's increase atorvastatin from 40 mg to 80 mg at bedtime; recheck lipids in 6-8 weeks (I've ordered) His A1c is not controlled, so I'm going to suggest switching from the Januvia to an injectable, which may also help him lose some weight; he should NOT take the new medicine (Trulicity) if fam hx of MEN-2 or personal hx of medullary thyroid cancer One of his tests called alkaline phosphatase has been high for years, and it's lower than the last check; another test is running to see if from liver or bone or intestines, but it is better than before Vitamin D is low; start Rx vitamin D He is anemic, and I know he already had a colonoscopy; please REFER to HEMATOLOGY for evaluation, Dx: iron deficiency anemia

## 2017-10-29 NOTE — Progress Notes (Signed)
Stop DPP-4 Start GLP-1 Start Rx vit D Awaiting alk phos iso Staff to REFER to hematologist for anemia

## 2017-10-30 ENCOUNTER — Telehealth: Payer: Self-pay

## 2017-10-30 ENCOUNTER — Telehealth: Payer: Self-pay | Admitting: Oncology

## 2017-10-30 NOTE — Telephone Encounter (Signed)
Copied from [place] [phone]. Topic: Referral - Question >> Oct 30, 2017 10:12 AM Paul Spears wrote: Reason for CRM: Paul Spears from [facility] called and stated that they are unable to reach the patient. She has called the emergency contact as well and left a voicemail. The appt is for 5/23. This is just FYI!!  I called him and he stated that he has a hard time hearing and preferred if we call him at or after 4pm that way his son, Paul Spears, could take down the appointment information.

## 2017-10-30 NOTE — Telephone Encounter (Signed)
Consult for IRON DEFICIENCY ANEMIA. Ref by Dr. Arnetha Courser. Notes in Epic. New patient pkt given to Registration for patient to complete upon arrival.  L/M on son's/Gary  v/m. Patient phone has no v/m. Also mailed appt ltr.  Ref office/[facility] was notified of appt schd for 11/06/17 with the arrival time of 2:30 p.m.

## 2017-11-03 ENCOUNTER — Other Ambulatory Visit: Payer: Self-pay

## 2017-11-03 DIAGNOSIS — I10 Essential (primary) hypertension: Secondary | ICD-10-CM

## 2017-11-03 MED ORDER — LOSARTAN POTASSIUM-HCTZ 50-12.5 MG PO TABS: 1.0000 | ORAL_TABLET | Freq: Every day | ORAL | 0 refills | Status: DC

## 2017-11-03 NOTE — Telephone Encounter (Signed)
Last Cr and K+ reviewed; Rx approved 

## 2017-11-05 ENCOUNTER — Ambulatory Visit
Admission: RE | Admit: 2017-11-05 | Discharge: 2017-11-05 | Disposition: A | Discharge status: Home / Self Care | Payer: Medicare Other | Source: Ambulatory Visit | Attending: Family Medicine | Admitting: Family Medicine

## 2017-11-05 DIAGNOSIS — R1011 Right upper quadrant pain: Secondary | ICD-10-CM | POA: Insufficient documentation

## 2017-11-06 ENCOUNTER — Encounter: Payer: Self-pay | Admitting: Oncology

## 2017-11-06 ENCOUNTER — Inpatient Hospital Stay: Payer: Medicare Other | Attending: Oncology | Admitting: Oncology

## 2017-11-06 VITALS — BP 150/100 | HR 106 | Temp 98.5°F | Resp 18 | Ht 71.0 in | Wt 206.5 lb

## 2017-11-06 DIAGNOSIS — D500 Iron deficiency anemia secondary to blood loss (chronic): Secondary | ICD-10-CM

## 2017-11-06 DIAGNOSIS — D473 Essential (hemorrhagic) thrombocythemia: Secondary | ICD-10-CM

## 2017-11-06 DIAGNOSIS — D509 Iron deficiency anemia, unspecified: Secondary | ICD-10-CM | POA: Diagnosis not present

## 2017-11-06 DIAGNOSIS — K2270 Barrett's esophagus without dysplasia: Secondary | ICD-10-CM

## 2017-11-06 DIAGNOSIS — D75839 Thrombocytosis, unspecified: Secondary | ICD-10-CM

## 2017-11-06 LAB — CBC WITH DIFFERENTIAL/PLATELET
BASOS ABS: 88 {cells}/uL (ref 0–200)
Basophils Relative: 1.2 %
EOS ABS: 277 {cells}/uL (ref 15–500)
Eosinophils Relative: 3.8 %
HEMATOCRIT: 33.9 % — AB (ref 38.5–50.0)
Hemoglobin: 11 g/dL — ABNORMAL LOW (ref 13.2–17.1)
LYMPHS ABS: 1774 {cells}/uL (ref 850–3900)
MCH: 25.3 pg — AB (ref 27.0–33.0)
MCHC: 32.4 g/dL (ref 32.0–36.0)
MCV: 78.1 fL — AB (ref 80.0–100.0)
MPV: 10.5 fL (ref 7.5–12.5)
Monocytes Relative: 8.6 %
NEUTROS PCT: 62.1 %
Neutro Abs: 4533 cells/uL (ref 1500–7800)
Platelets: 435 10*3/uL — ABNORMAL HIGH (ref 140–400)
RBC: 4.34 10*6/uL (ref 4.20–5.80)
RDW: 15 % (ref 11.0–15.0)
TOTAL LYMPHOCYTE: 24.3 %
WBC: 7.3 10*3/uL (ref 3.8–10.8)
WBCMIX: 628 {cells}/uL (ref 200–950)

## 2017-11-06 LAB — ALKALINE PHOSPHATASE ISOENZYMES
Alkaline phosphatase (APISO): 135 U/L — ABNORMAL HIGH (ref 40–115)
Bone Isoenzymes: 29 % (ref 28–66)
INTESTINAL ISOENZYMES (ALP ISO): 9 % (ref 1–24)
LIVER ISOENZYMES (ALP ISO): 62 % (ref 25–69)

## 2017-11-06 LAB — HEMOGLOBIN A1C
HEMOGLOBIN A1C: 7.4 %{Hb} — AB (ref <5.7)
Mean Plasma Glucose: 166 (calc)
eAG (mmol/L): 9.2 (calc)

## 2017-11-06 LAB — COMPLETE METABOLIC PANEL WITH GFR
AG RATIO: 1.6 (calc) (ref 1.0–2.5)
ALBUMIN MSPROF: 4.5 g/dL (ref 3.6–5.1)
ALKALINE PHOSPHATASE (APISO): 132 U/L — AB (ref 40–115)
ALT: 14 U/L (ref 9–46)
AST: 16 U/L (ref 10–35)
BILIRUBIN TOTAL: 0.5 mg/dL (ref 0.2–1.2)
BUN / CREAT RATIO: 19 (calc) (ref 6–22)
BUN: 27 mg/dL — ABNORMAL HIGH (ref 7–25)
CO2: 22 mmol/L (ref 20–32)
Calcium: 9.3 mg/dL (ref 8.6–10.3)
Chloride: 103 mmol/L (ref 98–110)
Creat: 1.45 mg/dL — ABNORMAL HIGH (ref 0.70–1.18)
GFR, EST AFRICAN AMERICAN: 56 mL/min/{1.73_m2} — AB (ref >=60)
GFR, Est Non African American: 48 mL/min/{1.73_m2} — ABNORMAL LOW (ref >=60)
Globulin: 2.8 g/dL (calc) (ref 1.9–3.7)
Glucose, Bld: 127 mg/dL — ABNORMAL HIGH (ref 65–99)
POTASSIUM: 5.3 mmol/L (ref 3.5–5.3)
SODIUM: 136 mmol/L (ref 135–146)
TOTAL PROTEIN: 7.3 g/dL (ref 6.1–8.1)

## 2017-11-06 LAB — IRON,TIBC AND FERRITIN PANEL
%SAT: 10 % (calc) — ABNORMAL LOW (ref 15–60)
FERRITIN: 13 ng/mL — AB (ref 20–380)
IRON: 49 ug/dL — AB (ref 50–180)
TIBC: 471 mcg/dL (calc) — ABNORMAL HIGH (ref 250–425)

## 2017-11-06 LAB — LIPID PANEL
Cholesterol: 173 mg/dL (ref <200)
HDL: 36 mg/dL — AB (ref >40)
LDL CHOLESTEROL (CALC): 107 mg/dL — AB
Non-HDL Cholesterol (Calc): 137 mg/dL (calc) — ABNORMAL HIGH (ref <130)
TRIGLYCERIDES: 179 mg/dL — AB (ref <150)
Total CHOL/HDL Ratio: 4.8 (calc) (ref <5.0)

## 2017-11-06 LAB — MICROALBUMIN / CREATININE URINE RATIO
CREATININE, URINE: 221 mg/dL (ref 20–320)
MICROALB UR: 2.5 mg/dL
Microalb Creat Ratio: 11 mcg/mg creat (ref <30)

## 2017-11-06 LAB — VITAMIN D 25 HYDROXY (VIT D DEFICIENCY, FRACTURES): Vit D, 25-Hydroxy: 20 ng/mL — ABNORMAL LOW (ref 30–100)

## 2017-11-06 NOTE — Progress Notes (Signed)
Patient c/o feeling fatigue with weakness  Patient c/o having a fall ( 3 months ago) / Patient states he became dizzy and fell with no injuries

## 2017-11-06 NOTE — Progress Notes (Signed)
Hematology/Oncology Consult note [facility] [phone] [phone]   Patient Care Team: Arnetha Courser, MD as PCP - General (Family Medicine)  REFERRING PROVIDER: Arnetha Courser, MD CHIEF COMPLAINTS/PURPOSE OF CONSULTATION:  Evaluation of iron deficiency anemia.   HISTORY OF PRESENTING ILLNESS:  Paul Spears is a  72 y.o.  male with PMH listed below who was referred to me for evaluation of iron deficiency anemia.  Patient recently had lab work done which revealed thrombocytopenia, hemoglobin was 11, MCV 78.1, platelet 435,000.  Iron 49, TIBC 471, %Sat 10, ferritin 13.  Denies hematochezia, hematuria, hematemesis, epistaxis, black tarry stool or easy bruising.  Patient reports feeling tied, fatigue.  Chronic hearing impairment.  Eats lots of ice chips.  Last EGD in May 2018 which showed barrett esophagus. Non bleeding gastropathy. Biopsy showed no dysplasia.  Last colonoscopy May 2014 showed polyps resected and retrieved.   Review of Systems  Constitutional: Positive for malaise/fatigue. Negative for chills, fever and weight loss.  HENT: Negative for congestion, ear discharge, ear pain, nosebleeds, sinus pain and sore throat.   Eyes: Negative for double vision, photophobia, pain, discharge and redness.  Respiratory: Negative for cough, hemoptysis, sputum production, shortness of breath and wheezing.   Cardiovascular: Negative for chest pain, palpitations, orthopnea, claudication and leg swelling.  Gastrointestinal: Negative for abdominal pain, blood in stool, constipation, diarrhea, heartburn, melena, nausea and vomiting.  Genitourinary: Negative for dysuria, flank pain, frequency and hematuria.  Musculoskeletal: Negative for back pain, myalgias and neck pain.  Skin: Negative for itching and rash.  Neurological: Negative for dizziness, tingling, tremors, focal weakness, weakness and headaches.  Endo/Heme/Allergies: Negative for  environmental allergies. Does not bruise/bleed easily.  Psychiatric/Behavioral: Negative for depression and hallucinations. The patient is not nervous/anxious.     MEDICAL HISTORY:  Past Medical History:  Diagnosis Date  . Diabetes mellitus without complication ([place])   . Hyperlipidemia   . Hypertension   . Kidney disease, chronic, stage III (GFR 30-59 ml/min) (HCC)     SURGICAL HISTORY: Past Surgical History:  Procedure Laterality Date  . CARPAL TUNNEL RELEASE Right   . ESOPHAGOGASTRODUODENOSCOPY (EGD) WITH PROPOFOL N/A 10/22/2016   Procedure: ESOPHAGOGASTRODUODENOSCOPY (EGD) WITH PROPOFOL;  Surgeon: Lollie Sails, MD;  Location: [facility] ENDOSCOPY;  Service: Endoscopy;  Laterality: N/A;  . FRACTURE SURGERY      SOCIAL HISTORY: Social History   Socioeconomic History  . Marital status: Divorced    Spouse name: Not on file  . Number of children: Not on file  . Years of education: Not on file  . Highest education level: Not on file  Occupational History  . Not on file  Social Needs  . Financial resource strain: Not on file  . Food insecurity:    Worry: Not on file    Inability: Not on file  . Transportation needs:    Medical: Not on file    Non-medical: Not on file  Tobacco Use  . Smoking status: Never Smoker  . Smokeless tobacco: Never Used  Substance and Sexual Activity  . Alcohol use: No  . Drug use: No  . Sexual activity: Not Currently  Lifestyle  . Physical activity:    Days per week: Not on file    Minutes per session: Not on file  . Stress: Not on file  Relationships  . Social connections:    Talks on phone: Not on file    Gets together: Not on file    Attends religious service: Not  on file    Active member of club or organization: Not on file    Attends meetings of clubs or organizations: Not on file    Relationship status: Not on file  . Intimate partner violence:    Fear of current or ex partner: Not on file    Emotionally abused: Not on file     Physically abused: Not on file    Forced sexual activity: Not on file  Other Topics Concern  . Not on file  Social History Narrative  . Not on file    FAMILY HISTORY: Family History  Problem Relation Age of Onset  . Hypertension Father   . Heart attack Father   . Heart disease Sister   . Hypertension Sister     ALLERGIES:  has No Known Allergies.  MEDICATIONS:  Current Outpatient Medications  Medication Sig Dispense Refill  . ACCU-CHEK SOFTCLIX LANCETS lancets     . atorvastatin (LIPITOR) 80 MG tablet Take 1 tablet (80 mg total) by mouth daily. 90 tablet 0  . Blood Glucose Monitoring Suppl (ACCU-CHEK AVIVA) device USE AS DIRECTED 100 each 0  . Blood Glucose Monitoring Suppl (ACCU-CHEK AVIVA) device USE AS DIRECTED 100 each 0  . Dulaglutide (TRULICITY) 0.16 WF/0.9NA SOPN Inject into the skin (subcutaneously) once a week; for diabetes; STOP Januvia 4 pen 2  . glimepiride (AMARYL) 4 MG tablet TAKE 1 TABLET BY MOUTH ONCE DAILY WITH  BREAKFAST 90 tablet 0  . glucose blood (ACCU-CHEK AVIVA PLUS) test strip USE AS DIRECTED - may check glucose 1-2 times daily. 100 each 11  . losartan-hydrochlorothiazide (HYZAAR) 50-12.5 MG tablet Take 1 tablet by mouth daily. 90 tablet 0  . metFORMIN (GLUCOPHAGE) 1000 MG tablet Take 1 tablet (1,000 mg total) by mouth 2 (two) times daily with a meal. 180 tablet 0  . esomeprazole (NEXIUM) 20 MG capsule Take 20 mg by mouth daily at 12 noon.     . fluticasone (FLONASE) 50 MCG/ACT nasal spray Place 2 sprays into both nostrils daily as needed. (Patient not taking: Reported on 11/06/2017) 16 g 11  . Vitamin D, Ergocalciferol, (DRISDOL) 50000 units CAPS capsule Take 1 capsule (50,000 Units total) by mouth every 7 (seven) days. (Patient not taking: Reported on 11/06/2017) 4 capsule 1   No current facility-administered medications for this visit.      PHYSICAL EXAMINATION: ECOG PERFORMANCE STATUS: 1 - Symptomatic but completely ambulatory Vitals:   11/06/17  1501  BP: (!) 150/100  Pulse: (!) 106  Resp: 18  Temp: 98.5 F (36.9 C)  SpO2: 99%   Filed Weights   11/06/17 1501  Weight: 206 lb 8 oz (93.7 kg)    Physical Exam  Constitutional: He is oriented to person, place, and time. He appears well-developed and well-nourished. No distress.  HENT:  Head: Normocephalic and atraumatic.  Right Ear: External ear normal.  Left Ear: External ear normal.  Mouth/Throat: Oropharynx is clear and moist.  Eyes: Pupils are equal, round, and reactive to light. EOM are normal. No scleral icterus.  Pale conjunctivae.   Neck: Normal range of motion. Neck supple.  Cardiovascular: Normal rate, regular rhythm and normal heart sounds.  Pulmonary/Chest: Effort normal and breath sounds normal. No respiratory distress. He has no wheezes. He has no rales. He exhibits no tenderness.  Abdominal: Soft. Bowel sounds are normal. He exhibits no distension and no mass. There is no tenderness.  Musculoskeletal: Normal range of motion. He exhibits no edema or deformity.  Lymphadenopathy:  He has no cervical adenopathy.  Neurological: He is alert and oriented to person, place, and time. No cranial nerve deficit. Coordination normal.  Skin: Skin is warm and dry. No rash noted.  Psychiatric: He has a normal mood and affect. His behavior is normal. Thought content normal.     LABORATORY DATA:  I have reviewed the data as listed Lab Results  Component Value Date   WBC 7.3 10/28/2017   HGB 11.0 (L) 10/28/2017   HCT 33.9 (L) 10/28/2017   MCV 78.1 (L) 10/28/2017   PLT 435 (H) 10/28/2017   Recent Labs    01/15/17 0955 07/24/17 0908 08/08/17 0952 10/28/17 1038  NA 139 139 138 136  K 4.7 4.7 4.6 5.3  CL 105 104 103 103  CO2 17* 22 24 22   GLUCOSE 131* 120* 154* 127*  BUN 20 21 18  27*  CREATININE 1.43* 1.42* 1.46* 1.45*  CALCIUM 9.4 9.6 9.4 9.3  GFRNONAA 49* 49* 48* 48*  GFRAA 57* 57* 55* 56*  PROT 7.1 7.3 6.9 7.3  ALBUMIN 4.6  --   --   --   AST 20 16 14 16    ALT 17 14 13 14   ALKPHOS 111  --   --   --   BILITOT 0.4 0.6 0.5 0.5       ASSESSMENT & PLAN:  1. Iron deficiency anemia due to chronic blood loss   2. Barrett's esophagus without dysplasia   3. Thrombocytosis (HCC)    Plan IV iron with Feraheme 510mg  weekly x 2.  Allergy reactions/infusion reaction including anaphylactic reaction discussed with patient. Patient voices understanding and willing to proceed.  Advise patient to continue follow up with Gastroenterology for further evaluation and follow up of gastropathy.  Thrombocytosis likely due to iron deficiency. Continue to monitor.  All questions were answered. The patient knows to call the clinic with any problems questions or concerns.  Return of visit: 6 weeks.  Thank you for this kind referral and the opportunity to participate in the care of this patient. A copy of today's note is routed to referring provider    Earlie Server, MD, PhD Hematology Oncology [facility] at [facility] Pager- [phone] 11/06/2017

## 2017-11-11 ENCOUNTER — Encounter: Payer: Self-pay | Admitting: Oncology

## 2017-11-11 ENCOUNTER — Other Ambulatory Visit: Payer: Self-pay | Admitting: Oncology

## 2017-11-11 DIAGNOSIS — D500 Iron deficiency anemia secondary to blood loss (chronic): Secondary | ICD-10-CM

## 2017-11-11 DIAGNOSIS — D509 Iron deficiency anemia, unspecified: Secondary | ICD-10-CM

## 2017-11-11 HISTORY — DX: Iron deficiency anemia, unspecified: D50.9

## 2017-11-14 ENCOUNTER — Inpatient Hospital Stay: Payer: Medicare Other

## 2017-11-14 VITALS — BP 110/71 | HR 103 | Temp 97.6°F | Resp 18

## 2017-11-14 DIAGNOSIS — D500 Iron deficiency anemia secondary to blood loss (chronic): Secondary | ICD-10-CM

## 2017-11-14 DIAGNOSIS — D509 Iron deficiency anemia, unspecified: Secondary | ICD-10-CM | POA: Diagnosis not present

## 2017-11-14 MED ORDER — FERUMOXYTOL INJECTION 510 MG/17 ML
510.0000 mg | Freq: Once | INTRAVENOUS | Status: AC
  Administered 2017-11-14: 510 mg via INTRAVENOUS
  Filled 2017-11-14: qty 17

## 2017-11-14 MED ORDER — SODIUM CHLORIDE 0.9 % IV SOLN
Freq: Once | INTRAVENOUS | Status: AC
Start: 2017-11-14 — End: 2017-11-14
  Administered 2017-11-14: 14:00:00 via INTRAVENOUS
  Filled 2017-11-14: qty 1000

## 2017-11-21 ENCOUNTER — Inpatient Hospital Stay: Payer: Medicare Other | Attending: Oncology

## 2017-11-21 VITALS — BP 116/75 | HR 101 | Temp 97.5°F | Resp 18

## 2017-11-21 DIAGNOSIS — D500 Iron deficiency anemia secondary to blood loss (chronic): Secondary | ICD-10-CM

## 2017-11-21 DIAGNOSIS — D509 Iron deficiency anemia, unspecified: Secondary | ICD-10-CM | POA: Insufficient documentation

## 2017-11-21 MED ORDER — SODIUM CHLORIDE 0.9 % IV SOLN
510.0000 mg | Freq: Once | INTRAVENOUS | Status: AC
  Administered 2017-11-21: 510 mg via INTRAVENOUS
  Filled 2017-11-21 (×3): qty 17

## 2017-11-21 MED ORDER — SODIUM CHLORIDE 0.9 % IV SOLN
Freq: Once | INTRAVENOUS | Status: AC
  Administered 2017-11-21: 14:00:00 via INTRAVENOUS
  Filled 2017-11-21: qty 1000

## 2017-12-16 ENCOUNTER — Other Ambulatory Visit: Payer: Self-pay

## 2017-12-16 ENCOUNTER — Inpatient Hospital Stay: Payer: Medicare Other | Attending: Oncology

## 2017-12-16 DIAGNOSIS — D509 Iron deficiency anemia, unspecified: Secondary | ICD-10-CM | POA: Insufficient documentation

## 2017-12-16 DIAGNOSIS — K2270 Barrett's esophagus without dysplasia: Secondary | ICD-10-CM | POA: Insufficient documentation

## 2017-12-16 DIAGNOSIS — D473 Essential (hemorrhagic) thrombocythemia: Secondary | ICD-10-CM | POA: Diagnosis not present

## 2017-12-16 DIAGNOSIS — D500 Iron deficiency anemia secondary to blood loss (chronic): Secondary | ICD-10-CM

## 2017-12-16 LAB — IRON AND TIBC
Iron: 100 ug/dL (ref 45–182)
Saturation Ratios: 28 % (ref 17.9–39.5)
TIBC: 352 ug/dL (ref 250–450)
UIBC: 252 ug/dL

## 2017-12-16 LAB — CBC WITH DIFFERENTIAL/PLATELET
BASOS ABS: 0.1 10*3/uL (ref 0–0.1)
Basophils Relative: 1 %
Eosinophils Absolute: 0.1 10*3/uL (ref 0–0.7)
Eosinophils Relative: 1 %
HEMATOCRIT: 35.9 % — AB (ref 40.0–52.0)
Hemoglobin: 12.3 g/dL — ABNORMAL LOW (ref 13.0–18.0)
LYMPHS PCT: 27 %
Lymphs Abs: 2.2 10*3/uL (ref 1.0–3.6)
MCH: 28.7 pg (ref 26.0–34.0)
MCHC: 34.1 g/dL (ref 32.0–36.0)
MCV: 84.2 fL (ref 80.0–100.0)
MONO ABS: 0.7 10*3/uL (ref 0.2–1.0)
Monocytes Relative: 9 %
Neutro Abs: 5.1 10*3/uL (ref 1.4–6.5)
Neutrophils Relative %: 62 %
Platelets: 367 10*3/uL (ref 150–440)
RBC: 4.26 MIL/uL — AB (ref 4.40–5.90)
RDW: 22 % — ABNORMAL HIGH (ref 11.5–14.5)
WBC: 8.1 10*3/uL (ref 3.8–10.6)

## 2017-12-16 LAB — FERRITIN: FERRITIN: 168 ng/mL (ref 24–336)

## 2017-12-17 ENCOUNTER — Other Ambulatory Visit: Payer: Self-pay

## 2017-12-17 ENCOUNTER — Other Ambulatory Visit: Payer: Medicare Other

## 2017-12-17 ENCOUNTER — Inpatient Hospital Stay: Payer: Medicare Other | Admitting: Oncology

## 2017-12-17 ENCOUNTER — Encounter: Payer: Self-pay | Admitting: Oncology

## 2017-12-17 VITALS — BP 134/93 | HR 94 | Temp 96.8°F | Resp 18 | Wt 200.6 lb

## 2017-12-17 DIAGNOSIS — D473 Essential (hemorrhagic) thrombocythemia: Secondary | ICD-10-CM

## 2017-12-17 DIAGNOSIS — K2270 Barrett's esophagus without dysplasia: Secondary | ICD-10-CM | POA: Diagnosis not present

## 2017-12-17 DIAGNOSIS — D509 Iron deficiency anemia, unspecified: Secondary | ICD-10-CM | POA: Diagnosis not present

## 2017-12-17 DIAGNOSIS — D500 Iron deficiency anemia secondary to blood loss (chronic): Secondary | ICD-10-CM

## 2017-12-17 DIAGNOSIS — D75839 Thrombocytosis, unspecified: Secondary | ICD-10-CM

## 2017-12-17 NOTE — Progress Notes (Signed)
Hematology/Oncology  Follow up note [name] [phone] [phone]   Patient Care Team: Arnetha Courser, MD as PCP - General (Family Medicine)  REFERRING PROVIDER: Arnetha Courser, MD REASON FOR VISIT Follow up for treatment of  iron deficiency anemia.   HISTORY OF PRESENTING ILLNESS:  Paul Spears is a  72 y.o.  male with PMH listed below who was referred to me for evaluation of iron deficiency anemia.  Patient recently had lab work done which revealed thrombocytopenia, hemoglobin was 11, MCV 78.1, platelet 435,000.  Iron 49, TIBC 471, %Sat 10, ferritin 13.  Denies hematochezia, hematuria, hematemesis, epistaxis, black tarry stool or easy bruising.  Patient reports feeling tied, fatigue.  Chronic hearing impairment.  Eats lots of ice chips.  Last EGD in May 2018 which showed barrett esophagus. Non bleeding gastropathy. Biopsy showed no dysplasia.  Last colonoscopy May 2014 showed polyps resected and retrieved.   INTERVAL HISTORY Paul Spears is a 72 y.o. male who has above history reviewed by me today presents for follow up visit for management of anemia. Patient status post IV Feraheme x2.  Subjectively feeling fatigue is a lot better. no new complaints.  Review of Systems  Constitutional: Negative for chills, fever, malaise/fatigue and weight loss.  HENT: Negative for congestion, ear discharge, ear pain, nosebleeds, sinus pain and sore throat.   Eyes: Negative for double vision, photophobia, pain, discharge and redness.  Respiratory: Negative for cough, hemoptysis, sputum production, shortness of breath and wheezing.   Cardiovascular: Negative for chest pain, palpitations, orthopnea, claudication and leg swelling.  Gastrointestinal: Negative for abdominal pain, blood in stool, constipation, diarrhea, heartburn, melena, nausea and vomiting.  Genitourinary: Negative for dysuria, flank pain, frequency and hematuria.    Musculoskeletal: Negative for back pain, myalgias and neck pain.  Skin: Negative for itching and rash.  Neurological: Negative for dizziness, tingling, tremors, focal weakness, weakness and headaches.  Endo/Heme/Allergies: Negative for environmental allergies. Does not bruise/bleed easily.  Psychiatric/Behavioral: Negative for depression and hallucinations. The patient is not nervous/anxious.     MEDICAL HISTORY:  Past Medical History:  Diagnosis Date  . Diabetes mellitus without complication ([place])   . Hyperlipidemia   . Hypertension   . Iron deficiency anemia 11/11/2017  . Kidney disease, chronic, stage III (GFR 30-59 ml/min) (HCC)     SURGICAL HISTORY: Past Surgical History:  Procedure Laterality Date  . CARPAL TUNNEL RELEASE Right   . ESOPHAGOGASTRODUODENOSCOPY (EGD) WITH PROPOFOL N/A 10/22/2016   Procedure: ESOPHAGOGASTRODUODENOSCOPY (EGD) WITH PROPOFOL;  Surgeon: Lollie Sails, MD;  Location: [facility] ENDOSCOPY;  Service: Endoscopy;  Laterality: N/A;  . FRACTURE SURGERY      SOCIAL HISTORY: Social History   Socioeconomic History  . Marital status: Divorced    Spouse name: Not on file  . Number of children: Not on file  . Years of education: Not on file  . Highest education level: Not on file  Occupational History  . Not on file  Social Needs  . Financial resource strain: Not on file  . Food insecurity:    Worry: Not on file    Inability: Not on file  . Transportation needs:    Medical: Not on file    Non-medical: Not on file  Tobacco Use  . Smoking status: Never Smoker  . Smokeless tobacco: Never Used  Substance and Sexual Activity  . Alcohol use: No  . Drug use: No  . Sexual activity: Not Currently  Lifestyle  . Physical activity:  Days per week: Not on file    Minutes per session: Not on file  . Stress: Not on file  Relationships  . Social connections:    Talks on phone: Not on file    Gets together: Not on file    Attends religious service: Not  on file    Active member of club or organization: Not on file    Attends meetings of clubs or organizations: Not on file    Relationship status: Not on file  . Intimate partner violence:    Fear of current or ex partner: Not on file    Emotionally abused: Not on file    Physically abused: Not on file    Forced sexual activity: Not on file  Other Topics Concern  . Not on file  Social History Narrative  . Not on file    FAMILY HISTORY: Family History  Problem Relation Age of Onset  . Hypertension Father   . Heart attack Father   . Heart disease Sister   . Hypertension Sister     ALLERGIES:  has No Known Allergies.  MEDICATIONS:  Current Outpatient Medications  Medication Sig Dispense Refill  . ACCU-CHEK SOFTCLIX LANCETS lancets     . atorvastatin (LIPITOR) 80 MG tablet Take 1 tablet (80 mg total) by mouth daily. 90 tablet 0  . Blood Glucose Monitoring Suppl (ACCU-CHEK AVIVA) device USE AS DIRECTED 100 each 0  . Blood Glucose Monitoring Suppl (ACCU-CHEK AVIVA) device USE AS DIRECTED 100 each 0  . Dulaglutide (TRULICITY) 4.54 UJ/8.1XB SOPN Inject into the skin (subcutaneously) once a week; for diabetes; STOP Januvia 4 pen 2  . esomeprazole (NEXIUM) 20 MG capsule Take 20 mg by mouth daily at 12 noon.     . fluticasone (FLONASE) 50 MCG/ACT nasal spray Place 2 sprays into both nostrils daily as needed. 16 g 11  . glimepiride (AMARYL) 4 MG tablet TAKE 1 TABLET BY MOUTH ONCE DAILY WITH  BREAKFAST 90 tablet 0  . glucose blood (ACCU-CHEK AVIVA PLUS) test strip USE AS DIRECTED - may check glucose 1-2 times daily. 100 each 11  . losartan-hydrochlorothiazide (HYZAAR) 50-12.5 MG tablet Take 1 tablet by mouth daily. 90 tablet 0  . metFORMIN (GLUCOPHAGE) 1000 MG tablet Take 1 tablet (1,000 mg total) by mouth 2 (two) times daily with a meal. 180 tablet 0  . Vitamin D, Ergocalciferol, (DRISDOL) 50000 units CAPS capsule Take 1 capsule (50,000 Units total) by mouth every 7 (seven) days. 4 capsule  1   No current facility-administered medications for this visit.      PHYSICAL EXAMINATION: ECOG PERFORMANCE STATUS: 1 - Symptomatic but completely ambulatory Vitals:   12/17/17 0926  BP: (!) 134/93  Pulse: 94  Resp: 18  Temp: (!) 96.8 F (36 C)  SpO2: 99%   Filed Weights   12/17/17 0926  Weight: 200 lb 9.6 oz (91 kg)    Physical Exam  Constitutional: He is oriented to person, place, and time. He appears well-developed and well-nourished. No distress.  HENT:  Head: Normocephalic and atraumatic.  Right Ear: External ear normal.  Left Ear: External ear normal.  Mouth/Throat: Oropharynx is clear and moist.  Eyes: Pupils are equal, round, and reactive to light. Conjunctivae and EOM are normal. No scleral icterus.  Neck: Normal range of motion. Neck supple.  Cardiovascular: Normal rate, regular rhythm and normal heart sounds.  Pulmonary/Chest: Effort normal and breath sounds normal. No respiratory distress. He has no wheezes. He has no rales. He exhibits  no tenderness.  Abdominal: Soft. Bowel sounds are normal. He exhibits no distension and no mass. There is no tenderness.  Musculoskeletal: Normal range of motion. He exhibits no edema or deformity.  Lymphadenopathy:    He has no cervical adenopathy.  Neurological: He is alert and oriented to person, place, and time. No cranial nerve deficit. Coordination normal.  Skin: Skin is warm and dry. No rash noted.  Psychiatric: He has a normal mood and affect. His behavior is normal. Thought content normal.     LABORATORY DATA:  I have reviewed the data as listed Lab Results  Component Value Date   WBC 8.1 12/16/2017   HGB 12.3 (L) 12/16/2017   HCT 35.9 (L) 12/16/2017   MCV 84.2 12/16/2017   PLT 367 12/16/2017   Recent Labs    01/15/17 0955 07/24/17 0908 08/08/17 0952 10/28/17 1038  NA 139 139 138 136  K 4.7 4.7 4.6 5.3  CL 105 104 103 103  CO2 17* 22 24 22   GLUCOSE 131* 120* 154* 127*  BUN 20 21 18  27*  CREATININE  1.43* 1.42* 1.46* 1.45*  CALCIUM 9.4 9.6 9.4 9.3  GFRNONAA 49* 49* 48* 48*  GFRAA 57* 57* 55* 56*  PROT 7.1 7.3 6.9 7.3  ALBUMIN 4.6  --   --   --   AST 20 16 14 16   ALT 17 14 13 14   ALKPHOS 111  --   --   --   BILITOT 0.4 0.6 0.5 0.5       ASSESSMENT & PLAN:  1. Iron deficiency anemia due to chronic blood loss   2. Barrett's esophagus without dysplasia   3. Thrombocytosis ([place])    Labs reviewed and discussed with patient.  His iron level has significantly improved after IV iron. Hold additional IV iron today. I discussed with patient that he needs to follow-up with gastroenterology for further evaluation and follow-up for his gastropathy.  He voices understanding.  He was last seen by Dr. Gustavo Lah in June 2018. Thrombocytosis likely secondary to iron deficiency anemia.  Resolved.  All questions were answered. The patient knows to call the clinic with any problems questions or concerns. CC Dr. Sanda Klein cc Dr. Gustavo Lah Return of visit: 6 weeks.  Thank you for this kind referral and the opportunity to participate in the care of this patient. A copy of today's note is routed to referring provider  Total face to face encounter time for this patient visit was 15 min. >50% of the time was  spent in counseling and coordination of care.   Earlie Server, MD, PhD Hematology Oncology [facility] at [facility] Pager- [phone] 12/17/2017

## 2017-12-17 NOTE — Progress Notes (Signed)
Patient her for follow up. Pt states right arm/shoulder pain is getting worse. He thinks its a Tic bite.

## 2017-12-19 ENCOUNTER — Ambulatory Visit: Payer: Medicare Other | Admitting: Oncology

## 2017-12-22 ENCOUNTER — Other Ambulatory Visit: Payer: Self-pay

## 2017-12-22 DIAGNOSIS — E1165 Type 2 diabetes mellitus with hyperglycemia: Principal | ICD-10-CM

## 2017-12-22 DIAGNOSIS — IMO0002 Reserved for concepts with insufficient information to code with codable children: Secondary | ICD-10-CM

## 2017-12-22 DIAGNOSIS — N183 Chronic kidney disease, stage 3 (moderate): Principal | ICD-10-CM

## 2017-12-22 DIAGNOSIS — E1122 Type 2 diabetes mellitus with diabetic chronic kidney disease: Secondary | ICD-10-CM

## 2017-12-22 MED ORDER — METFORMIN HCL 1000 MG PO TABS: 1000.0000 mg | ORAL_TABLET | Freq: Two times a day (BID) | ORAL | 0 refills | Status: DC

## 2017-12-22 MED ORDER — GLIMEPIRIDE 4 MG PO TABS: ORAL_TABLET | ORAL | 0 refills | Status: DC

## 2017-12-22 NOTE — Telephone Encounter (Signed)
GFR >45, limited Rx provided; appt next month

## 2017-12-31 LAB — HM DIABETES EYE EXAM

## 2018-01-13 ENCOUNTER — Other Ambulatory Visit: Payer: Self-pay

## 2018-01-13 ENCOUNTER — Other Ambulatory Visit: Payer: Self-pay | Admitting: Family Medicine

## 2018-01-13 DIAGNOSIS — E782 Mixed hyperlipidemia: Secondary | ICD-10-CM

## 2018-01-13 LAB — LIPID PANEL
CHOL/HDL RATIO: 3.7 (calc) (ref <5.0)
Cholesterol: 147 mg/dL (ref <200)
HDL: 40 mg/dL — AB (ref >40)
LDL Cholesterol (Calc): 85 mg/dL (calc)
NON-HDL CHOLESTEROL (CALC): 107 mg/dL (ref <130)
Triglycerides: 119 mg/dL (ref <150)

## 2018-01-13 MED ORDER — ATORVASTATIN CALCIUM 80 MG PO TABS: 80.0000 mg | ORAL_TABLET | Freq: Every day | ORAL | 3 refills | Status: DC

## 2018-01-13 MED ORDER — EZETIMIBE 10 MG PO TABS: 10.0000 mg | ORAL_TABLET | Freq: Every day | ORAL | 3 refills | Status: DC

## 2018-01-13 NOTE — Progress Notes (Signed)
Continue 80 mg lipitor; add zetia

## 2018-01-16 ENCOUNTER — Other Ambulatory Visit: Payer: Self-pay | Admitting: Family Medicine

## 2018-01-16 DIAGNOSIS — N183 Chronic kidney disease, stage 3 (moderate): Principal | ICD-10-CM

## 2018-01-16 DIAGNOSIS — E1165 Type 2 diabetes mellitus with hyperglycemia: Principal | ICD-10-CM

## 2018-01-16 DIAGNOSIS — IMO0002 Reserved for concepts with insufficient information to code with codable children: Secondary | ICD-10-CM

## 2018-01-16 DIAGNOSIS — E1122 Type 2 diabetes mellitus with diabetic chronic kidney disease: Secondary | ICD-10-CM

## 2018-01-16 NOTE — Telephone Encounter (Signed)
Lab Results  Component Value Date   HGBA1C 7.4 (H) 10/28/2017

## 2018-01-28 ENCOUNTER — Ambulatory Visit (INDEPENDENT_AMBULATORY_CARE_PROVIDER_SITE_OTHER): Payer: Medicare Other | Admitting: Family Medicine

## 2018-01-28 ENCOUNTER — Encounter: Payer: Self-pay | Admitting: Family Medicine

## 2018-01-28 VITALS — BP 120/68 | HR 103 | Temp 98.6°F | Ht 71.0 in | Wt 199.7 lb

## 2018-01-28 DIAGNOSIS — M25511 Pain in right shoulder: Secondary | ICD-10-CM | POA: Diagnosis not present

## 2018-01-28 DIAGNOSIS — D500 Iron deficiency anemia secondary to blood loss (chronic): Secondary | ICD-10-CM

## 2018-01-28 DIAGNOSIS — Z5181 Encounter for therapeutic drug level monitoring: Secondary | ICD-10-CM

## 2018-01-28 DIAGNOSIS — R0989 Other specified symptoms and signs involving the circulatory and respiratory systems: Secondary | ICD-10-CM

## 2018-01-28 DIAGNOSIS — IMO0002 Reserved for concepts with insufficient information to code with codable children: Secondary | ICD-10-CM

## 2018-01-28 DIAGNOSIS — I10 Essential (primary) hypertension: Secondary | ICD-10-CM | POA: Diagnosis not present

## 2018-01-28 DIAGNOSIS — E782 Mixed hyperlipidemia: Secondary | ICD-10-CM

## 2018-01-28 DIAGNOSIS — W57XXXD Bitten or stung by nonvenomous insect and other nonvenomous arthropods, subsequent encounter: Secondary | ICD-10-CM

## 2018-01-28 DIAGNOSIS — K22719 Barrett's esophagus with dysplasia, unspecified: Secondary | ICD-10-CM | POA: Diagnosis not present

## 2018-01-28 DIAGNOSIS — R000 Tachycardia, unspecified: Secondary | ICD-10-CM

## 2018-01-28 DIAGNOSIS — N183 Chronic kidney disease, stage 3 unspecified: Secondary | ICD-10-CM

## 2018-01-28 DIAGNOSIS — R809 Proteinuria, unspecified: Secondary | ICD-10-CM

## 2018-01-28 DIAGNOSIS — G8929 Other chronic pain: Secondary | ICD-10-CM

## 2018-01-28 DIAGNOSIS — E1165 Type 2 diabetes mellitus with hyperglycemia: Secondary | ICD-10-CM

## 2018-01-28 DIAGNOSIS — E1122 Type 2 diabetes mellitus with diabetic chronic kidney disease: Secondary | ICD-10-CM

## 2018-01-28 NOTE — Patient Instructions (Addendum)
Eat more spinach, lentils, kale, broccoli, black strap molasses on pancakes (watch sugar if doing that!) Take 1,000 iu of vitamin D3 once a day Congratulations on your weight loss We'll get labs today and contact you

## 2018-01-28 NOTE — Assessment & Plan Note (Signed)
Expect A1c to improve; praise given for weight loss; foot exam by MD today; eye exams yearly

## 2018-01-28 NOTE — Assessment & Plan Note (Signed)
Well controlled BP today

## 2018-01-28 NOTE — Progress Notes (Signed)
BP 120/68   Pulse (!) 103   Temp 98.6 F (37 C)   Ht 5\' 11"  (1.803 m)   Wt 199 lb 11.2 oz (90.6 kg)   SpO2 94%   BMI 27.85 kg/m    Subjective:    Patient ID: Paul Spears, male    DOB: 1946-05-21, 72 y.o.   MRN: [id number]  HPI: Paul Spears is a 72 y.o. male  Chief Complaint  Patient presents with  . Follow-up  . Arm Pain    Right. Onset 3+ months. Radiating down arm.     HPI Patient is here for f/u However, still having right shoulder; saw ortho, had xrays done, [facility] Thinks he had a tick bite on that shoulder; pain is laterally and down the arm; no other joints bothering him more than normal; had a shot in the left hand a while back; he did not do any formal PT  Type 2 diabetes; doing a lot better now; losing weight with the shot; trying to do a good job with his diet; salads, did have roast beef sandwich; otherwise, almost total vegetarian; just had eye exam Lab Results  Component Value Date   HGBA1C 7.4 (H) 10/28/2017    High cholesterol; on atorvastatin; eats too much cheese Lab Results  Component Value Date   CHOL 147 01/13/2018   HDL 40 (L) 01/13/2018   LDLCALC 85 01/13/2018   TRIG 119 01/13/2018   CHOLHDL 3.7 01/13/2018   Barretts, GERD; monitored by GI  CKD; I asked about NSAIDs; not taking any  Vitamin D was low at last check; took Rx  Depression screen [facility] 2/9 01/28/2018 10/28/2017 07/24/2017 07/10/2017 04/22/2017  Decreased Interest 0 0 0 0 0  Down, Depressed, Hopeless 0 0 0 0 0  PHQ - 2 Score 0 0 0 0 0    Relevant past medical, surgical, family and social history reviewed Past Medical History:  Diagnosis Date  . Diabetes mellitus without complication ([place])   . Hyperlipidemia   . Hypertension   . Iron deficiency anemia 11/11/2017  . Kidney disease, chronic, stage III (GFR 30-59 ml/min) (HCC)    Past Surgical History:  Procedure Laterality Date  . CARPAL TUNNEL RELEASE Right   . ESOPHAGOGASTRODUODENOSCOPY (EGD) WITH PROPOFOL N/A  10/22/2016   Procedure: ESOPHAGOGASTRODUODENOSCOPY (EGD) WITH PROPOFOL;  Surgeon: Lollie Sails, MD;  Location: [facility] ENDOSCOPY;  Service: Endoscopy;  Laterality: N/A;  . FRACTURE SURGERY     Family History  Problem Relation Age of Onset  . Hypertension Father   . Heart attack Father   . Heart disease Sister   . Hypertension Sister    Social History   Tobacco Use  . Smoking status: Never Smoker  . Smokeless tobacco: Never Used  Substance Use Topics  . Alcohol use: No  . Drug use: No    Interim medical history since last visit reviewed. Allergies and medications reviewed  Review of Systems Per HPI unless specifically indicated above     Objective:    BP 120/68   Pulse (!) 103   Temp 98.6 F (37 C)   Ht 5\' 11"  (1.803 m)   Wt 199 lb 11.2 oz (90.6 kg)   SpO2 94%   BMI 27.85 kg/m   Wt Readings from Last 3 Encounters:  01/28/18 199 lb 11.2 oz (90.6 kg)  12/17/17 200 lb 9.6 oz (91 kg)  11/06/17 206 lb 8 oz (93.7 kg)    Physical Exam  Constitutional: He appears  well-developed and well-nourished. No distress.  Weight loss 7 pounds since last May  HENT:  Head: Normocephalic and atraumatic.  Eyes: EOM are normal. No scleral icterus.  Neck: No thyromegaly present.  Cardiovascular: Normal rate and regular rhythm.  Heart rate between 90-95 during auscultation  Pulmonary/Chest: Effort normal and breath sounds normal.  Abdominal: Soft. Bowel sounds are normal. He exhibits no distension.  Musculoskeletal: He exhibits no edema.  Neurological: Coordination normal.  Skin: Skin is warm and dry. He is not diaphoretic. No pallor.  Psychiatric: He has a normal mood and affect. His behavior is normal. Judgment and thought content normal. His mood appears not anxious. He does not exhibit a depressed mood.   Diabetic Foot Form - Detailed   Diabetic Foot Exam - detailed Diabetic Foot exam was performed with the following findings:  Yes 01/28/2018  9:52 AM  Visual Foot Exam  completed.:  Yes  Pulse Foot Exam completed.:  Yes  Right Dorsalis Pedis:  Diminished Left Dorsalis Pedis:  Diminished  Sensory Foot Exam Completed.:  Yes Semmes-Weinstein Monofilament Test R Site 1-Great Toe:  Pos L Site 1-Great Toe:  Pos    Comments:  Diminished terminal hair growth both lower extremities; they are not cold to touch or violaceous     Results for orders placed or performed in visit on 01/13/18  Lipid panel  Result Value Ref Range   Cholesterol 147 <200 mg/dL   HDL 40 (L) >40 mg/dL   Triglycerides 119 <150 mg/dL   LDL Cholesterol (Calc) 85 mg/dL (calc)   Total CHOL/HDL Ratio 3.7 <5.0 (calc)   Non-HDL Cholesterol (Calc) 107 <130 mg/dL (calc)      Assessment & Plan:   Problem List Items Addressed This Visit      Cardiovascular and Mediastinum   HTN (hypertension)    Well-controlled BP today        Digestive   Barrett esophagus    Continue to f/u with GI doctor; continue PPI        Endocrine   Uncontrolled type 2 diabetes with stage 3 chronic kidney disease GFR 30-59 (HCC)    Expect A1c to improve; praise given for weight loss; foot exam by MD today; eye exams yearly      Relevant Orders   Hemoglobin A1c     Genitourinary   Chronic kidney disease (CKD), stage III (moderate) (HCC)    Tylenol is okay for shoulder pain, but follow package directions; avoid NSAIDs        Other   Pain in shoulder - Primary   Relevant Orders   Ambulatory referral to Orthopedic Surgery   Microalbuminuria    Most recent urine was normal; better control, checking A1c today; follow yearly now      Iron deficiency anemia    Monitored by hematologist; discussed vegetarian foods good for iron      Hyperlipidemia    Check lipids      Absent peripheral pulse    Offered ABI but patient declined       Other Visit Diagnoses    Tick bite, subsequent encounter       Relevant Orders   B. burgdorfi antibodies   Medication monitoring encounter       Relevant Orders    COMPLETE METABOLIC PANEL WITH GFR   Tachycardia       Relevant Orders   TSH       Follow up plan: Return in about 3 months (around 04/30/2018) for follow-up visit with Dr.  Alannie Amodio.  An after-visit summary was printed and given to the patient at [place].  Please see the patient instructions which may contain other information and recommendations beyond what is mentioned above in the assessment and plan.  No orders of the defined types were placed in this encounter.   Orders Placed This Encounter  Procedures  . B. burgdorfi antibodies  . Hemoglobin A1c  . COMPLETE METABOLIC PANEL WITH GFR  . TSH  . Ambulatory referral to Orthopedic Surgery

## 2018-01-28 NOTE — Assessment & Plan Note (Signed)
Offered ABI but patient declined

## 2018-01-28 NOTE — Assessment & Plan Note (Signed)
>>  ASSESSMENT AND PLAN FOR CHRONIC KIDNEY DISEASE (CKD), STAGE III (MODERATE) (HCC) WRITTEN ON 01/28/2018  9:28 AM BY LADA, MELINDA P, MD  Tylenol is okay for shoulder pain, but follow package directions; avoid NSAIDs

## 2018-01-28 NOTE — Assessment & Plan Note (Addendum)
Most recent urine was normal; better control, checking A1c today; follow yearly now

## 2018-01-28 NOTE — Assessment & Plan Note (Signed)
Check lipids 

## 2018-01-28 NOTE — Assessment & Plan Note (Signed)
Continue to f/u with GI doctor; continue PPI

## 2018-01-28 NOTE — Assessment & Plan Note (Signed)
Tylenol is okay for shoulder pain, but follow package directions; avoid NSAIDs

## 2018-01-28 NOTE — Assessment & Plan Note (Signed)
Monitored by hematologist; discussed vegetarian foods good for iron

## 2018-01-29 LAB — COMPLETE METABOLIC PANEL WITH GFR
AG RATIO: 2 (calc) (ref 1.0–2.5)
ALBUMIN MSPROF: 4.4 g/dL (ref 3.6–5.1)
ALKALINE PHOSPHATASE (APISO): 120 U/L — AB (ref 40–115)
ALT: 21 U/L (ref 9–46)
AST: 19 U/L (ref 10–35)
BILIRUBIN TOTAL: 0.6 mg/dL (ref 0.2–1.2)
BUN / CREAT RATIO: 18 (calc) (ref 6–22)
BUN: 23 mg/dL (ref 7–25)
CHLORIDE: 105 mmol/L (ref 98–110)
CO2: 20 mmol/L (ref 20–32)
Calcium: 9.2 mg/dL (ref 8.6–10.3)
Creat: 1.28 mg/dL — ABNORMAL HIGH (ref 0.70–1.18)
GFR, Est African American: 64 mL/min/{1.73_m2} (ref >=60)
GFR, Est Non African American: 56 mL/min/{1.73_m2} — ABNORMAL LOW (ref >=60)
GLOBULIN: 2.2 g/dL (ref 1.9–3.7)
Glucose, Bld: 124 mg/dL — ABNORMAL HIGH (ref 65–99)
POTASSIUM: 5.1 mmol/L (ref 3.5–5.3)
SODIUM: 139 mmol/L (ref 135–146)
Total Protein: 6.6 g/dL (ref 6.1–8.1)

## 2018-01-29 LAB — B. BURGDORFI ANTIBODIES

## 2018-01-29 LAB — HEMOGLOBIN A1C
Hgb A1c MFr Bld: 6.9 % of total Hgb — ABNORMAL HIGH (ref <5.7)
Mean Plasma Glucose: 151 (calc)
eAG (mmol/L): 8.4 (calc)

## 2018-01-29 LAB — TSH: TSH: 1.05 m[IU]/L (ref 0.40–4.50)

## 2018-02-08 ENCOUNTER — Other Ambulatory Visit: Payer: Self-pay | Admitting: Family Medicine

## 2018-02-08 DIAGNOSIS — I10 Essential (primary) hypertension: Secondary | ICD-10-CM

## 2018-04-08 ENCOUNTER — Other Ambulatory Visit: Payer: Self-pay | Admitting: Orthopedic Surgery

## 2018-04-09 ENCOUNTER — Encounter
Admission: RE | Admit: 2018-04-09 | Discharge: 2018-04-09 | Disposition: A | Discharge status: Home / Self Care | Payer: Medicare Other | Source: Ambulatory Visit | Attending: Orthopedic Surgery | Admitting: Orthopedic Surgery

## 2018-04-09 ENCOUNTER — Other Ambulatory Visit: Payer: Self-pay

## 2018-04-09 DIAGNOSIS — R000 Tachycardia, unspecified: Secondary | ICD-10-CM | POA: Diagnosis not present

## 2018-04-09 DIAGNOSIS — I10 Essential (primary) hypertension: Secondary | ICD-10-CM | POA: Diagnosis not present

## 2018-04-09 DIAGNOSIS — Z01818 Encounter for other preprocedural examination: Secondary | ICD-10-CM | POA: Diagnosis present

## 2018-04-09 HISTORY — DX: Gastro-esophageal reflux disease without esophagitis: K21.9

## 2018-04-09 LAB — BASIC METABOLIC PANEL
Anion gap: 8 (ref 5–15)
BUN: 18 mg/dL (ref 8–23)
CALCIUM: 9.2 mg/dL (ref 8.9–10.3)
CHLORIDE: 104 mmol/L (ref 98–111)
CO2: 25 mmol/L (ref 22–32)
CREATININE: 1.43 mg/dL — AB (ref 0.61–1.24)
GFR calc Af Amer: 55 mL/min — ABNORMAL LOW (ref >60)
GFR calc non Af Amer: 47 mL/min — ABNORMAL LOW (ref >60)
GLUCOSE: 238 mg/dL — AB (ref 70–99)
Potassium: 4.7 mmol/L (ref 3.5–5.1)
Sodium: 137 mmol/L (ref 135–145)

## 2018-04-09 LAB — APTT: aPTT: 28 seconds (ref 24–36)

## 2018-04-09 LAB — CBC WITH DIFFERENTIAL/PLATELET
Abs Immature Granulocytes: 0.07 10*3/uL (ref 0.00–0.07)
Basophils Absolute: 0.1 10*3/uL (ref 0.0–0.1)
Basophils Relative: 1 %
EOS ABS: 0.5 10*3/uL (ref 0.0–0.5)
EOS PCT: 6 %
HCT: 37.2 % — ABNORMAL LOW (ref 39.0–52.0)
HEMOGLOBIN: 12.6 g/dL — AB (ref 13.0–17.0)
Immature Granulocytes: 1 %
LYMPHS ABS: 1.7 10*3/uL (ref 0.7–4.0)
LYMPHS PCT: 21 %
MCH: 31 pg (ref 26.0–34.0)
MCHC: 33.9 g/dL (ref 30.0–36.0)
MCV: 91.4 fL (ref 80.0–100.0)
MONO ABS: 0.7 10*3/uL (ref 0.1–1.0)
MONOS PCT: 9 %
Neutro Abs: 4.9 10*3/uL (ref 1.7–7.7)
Neutrophils Relative %: 62 %
Platelets: 363 10*3/uL (ref 150–400)
RBC: 4.07 MIL/uL — ABNORMAL LOW (ref 4.22–5.81)
RDW: 13.2 % (ref 11.5–15.5)
WBC: 8 10*3/uL (ref 4.0–10.5)
nRBC: 0 % (ref 0.0–0.2)

## 2018-04-09 LAB — PROTIME-INR
INR: 1.03
Prothrombin Time: 13.4 seconds (ref 11.4–15.2)

## 2018-04-09 NOTE — Patient Instructions (Signed)
Your procedure is scheduled on: April 16, 2018 THURSDAY Report to Day Surgery on the 2nd floor of the [facility]. To find out your arrival time, please call [phone] between 1PM - 3PM on: Wednesday April 15, 2018  REMEMBER: Instructions that are not followed completely may result in serious medical risk, up to and including death; or upon the discretion of your surgeon and anesthesiologist your surgery may need to be rescheduled.  Do not eat food after midnight the night before surgery.  No gum chewing, lozengers or hard candies.  You may however, drink CLEAR liquids up to 2 hours before you are scheduled to arrive for your surgery. Do not drink anything within 2 hours of the start of your surgery.  Clear liquids include: - water  Do NOT drink anything that is not on this list.  Type 1 and Type 2 diabetics should only drink water.  No Alcohol for 24 hours before or after surgery.  No Smoking including e-cigarettes for 24 hours prior to surgery.  No chewable tobacco products for at least 6 hours prior to surgery.  No nicotine patches on the day of surgery.  On the morning of surgery brush your teeth with toothpaste and water, you may rinse your mouth with mouthwash if you wish. Do not swallow any toothpaste or mouthwash.  Notify your doctor if there is any change in your medical condition (cold, fever, infection).  Do not wear jewelry, make-up, hairpins, clips or nail polish.  Do not wear lotions, powders, or perfumes.   Do not shave 48 hours prior to surgery.   Contacts and dentures may not be worn into surgery.  Do not bring valuables to the hospital, including drivers license, insurance or credit cards.  [facility] is not responsible for any belongings or valuables.   TAKE THESE MEDICATIONS THE MORNING OF SURGERY: NONE  Use CHG Soap  as directed on instruction sheet.  USE FLONASE  Stop Metformin  2 days prior to surgery LAST DOSE April 13, 2018    Follow recommendations from Cardiologist, Pulmonologist or PCP regarding stopping Aspirin, Coumadin, Plavix, Eliquis, Pradaxa, or Pletal.  Stop Anti-inflammatories (NSAIDS) such as Advil, Aleve, Ibuprofen, Motrin, Naproxen, Naprosyn and Aspirin based products such as Excedrin, Goodys Powder, BC Powder. (May take Tylenol or Acetaminophen if needed.)  Stop ANY OVER THE COUNTER supplements until after surgery. (May continue Vitamin D, Vitamin B, and multivitamin.)  Wear comfortable clothing (specific to your surgery type) to the hospital.  Plan for stool softeners for home use.  If you are being discharged the day of surgery, you will not be allowed to drive home. You will need a responsible adult to drive you home and stay with you that night.   If you are taking public transportation, you will need to have a responsible adult with you. Please confirm with your physician that it is acceptable to use public transportation.   Please call [phone] if you have any questions about these instructions.

## 2018-04-09 NOTE — Pre-Procedure Instructions (Signed)
EKG show sinus tach with hr 107 recent PCP visit hr 103 and several other EKGs and office visit show HR greater than 100.

## 2018-04-15 ENCOUNTER — Other Ambulatory Visit: Payer: Self-pay | Admitting: Family Medicine

## 2018-04-15 DIAGNOSIS — IMO0002 Reserved for concepts with insufficient information to code with codable children: Secondary | ICD-10-CM

## 2018-04-15 DIAGNOSIS — E1165 Type 2 diabetes mellitus with hyperglycemia: Principal | ICD-10-CM

## 2018-04-15 DIAGNOSIS — E1122 Type 2 diabetes mellitus with diabetic chronic kidney disease: Secondary | ICD-10-CM

## 2018-04-15 DIAGNOSIS — N183 Chronic kidney disease, stage 3 (moderate): Principal | ICD-10-CM

## 2018-04-15 MED ORDER — CEFAZOLIN SODIUM-DEXTROSE 2-4 GM/100ML-% IV SOLN: 2.0000 g | INTRAVENOUS | Status: DC

## 2018-04-16 ENCOUNTER — Ambulatory Visit
Admission: RE | Admit: 2018-04-16 | Discharge: 2018-04-16 | Disposition: A | Discharge status: Home / Self Care | Payer: Medicare Other | Source: Ambulatory Visit | Attending: Orthopedic Surgery | Admitting: Orthopedic Surgery

## 2018-04-16 ENCOUNTER — Ambulatory Visit: Payer: Medicare Other | Admitting: Anesthesiology

## 2018-04-16 ENCOUNTER — Encounter: Payer: Self-pay | Admitting: Anesthesiology

## 2018-04-16 ENCOUNTER — Encounter
Admission: RE | Disposition: A | Discharge status: Home / Self Care | Payer: Self-pay | Source: Ambulatory Visit | Attending: Orthopedic Surgery

## 2018-04-16 ENCOUNTER — Other Ambulatory Visit: Payer: Self-pay

## 2018-04-16 DIAGNOSIS — S43431A Superior glenoid labrum lesion of right shoulder, initial encounter: Secondary | ICD-10-CM | POA: Diagnosis not present

## 2018-04-16 DIAGNOSIS — D631 Anemia in chronic kidney disease: Secondary | ICD-10-CM | POA: Insufficient documentation

## 2018-04-16 DIAGNOSIS — E785 Hyperlipidemia, unspecified: Secondary | ICD-10-CM | POA: Diagnosis not present

## 2018-04-16 DIAGNOSIS — M1990 Unspecified osteoarthritis, unspecified site: Secondary | ICD-10-CM | POA: Insufficient documentation

## 2018-04-16 DIAGNOSIS — I129 Hypertensive chronic kidney disease with stage 1 through stage 4 chronic kidney disease, or unspecified chronic kidney disease: Secondary | ICD-10-CM | POA: Diagnosis not present

## 2018-04-16 DIAGNOSIS — K219 Gastro-esophageal reflux disease without esophagitis: Secondary | ICD-10-CM | POA: Insufficient documentation

## 2018-04-16 DIAGNOSIS — Z79899 Other long term (current) drug therapy: Secondary | ICD-10-CM | POA: Diagnosis not present

## 2018-04-16 DIAGNOSIS — M19011 Primary osteoarthritis, right shoulder: Secondary | ICD-10-CM | POA: Diagnosis not present

## 2018-04-16 DIAGNOSIS — D509 Iron deficiency anemia, unspecified: Secondary | ICD-10-CM | POA: Diagnosis not present

## 2018-04-16 DIAGNOSIS — R0602 Shortness of breath: Secondary | ICD-10-CM | POA: Diagnosis not present

## 2018-04-16 DIAGNOSIS — Z7984 Long term (current) use of oral hypoglycemic drugs: Secondary | ICD-10-CM | POA: Diagnosis not present

## 2018-04-16 DIAGNOSIS — M75111 Incomplete rotator cuff tear or rupture of right shoulder, not specified as traumatic: Secondary | ICD-10-CM | POA: Insufficient documentation

## 2018-04-16 DIAGNOSIS — S46211A Strain of muscle, fascia and tendon of other parts of biceps, right arm, initial encounter: Secondary | ICD-10-CM | POA: Diagnosis present

## 2018-04-16 DIAGNOSIS — X58XXXA Exposure to other specified factors, initial encounter: Secondary | ICD-10-CM | POA: Insufficient documentation

## 2018-04-16 DIAGNOSIS — Z8249 Family history of ischemic heart disease and other diseases of the circulatory system: Secondary | ICD-10-CM | POA: Diagnosis not present

## 2018-04-16 DIAGNOSIS — E1122 Type 2 diabetes mellitus with diabetic chronic kidney disease: Secondary | ICD-10-CM | POA: Diagnosis not present

## 2018-04-16 DIAGNOSIS — N183 Chronic kidney disease, stage 3 (moderate): Secondary | ICD-10-CM | POA: Insufficient documentation

## 2018-04-16 DIAGNOSIS — S46111A Strain of muscle, fascia and tendon of long head of biceps, right arm, initial encounter: Secondary | ICD-10-CM | POA: Insufficient documentation

## 2018-04-16 DIAGNOSIS — S46811A Strain of other muscles, fascia and tendons at shoulder and upper arm level, right arm, initial encounter: Secondary | ICD-10-CM | POA: Insufficient documentation

## 2018-04-16 HISTORY — PX: SHOULDER ARTHROSCOPY WITH SUBACROMIAL DECOMPRESSION AND BICEP TENDON REPAIR: SHX5689

## 2018-04-16 LAB — GLUCOSE, CAPILLARY
Glucose-Capillary: 166 mg/dL — ABNORMAL HIGH (ref 70–99)
Glucose-Capillary: 159 mg/dL — ABNORMAL HIGH (ref 70–99)

## 2018-04-16 SURGERY — SHOULDER ARTHROSCOPY WITH SUBACROMIAL DECOMPRESSION AND BICEP TENDON REPAIR
Anesthesia: General | Laterality: Right

## 2018-04-16 MED ORDER — ONDANSETRON HCL 4 MG/2ML IJ SOLN
4.0000 mg | Freq: Once | INTRAMUSCULAR | Status: AC | PRN
  Administered 2018-04-16: 4 mg via INTRAVENOUS

## 2018-04-16 MED ORDER — LIDOCAINE HCL (PF) 1 % IJ SOLN
INTRAMUSCULAR | Status: AC
  Filled 2018-04-16: qty 5

## 2018-04-16 MED ORDER — LIDOCAINE HCL (PF) 2 % IJ SOLN
INTRAMUSCULAR | Status: AC
  Filled 2018-04-16: qty 10

## 2018-04-16 MED ORDER — FENTANYL CITRATE (PF) 100 MCG/2ML IJ SOLN
INTRAMUSCULAR | Status: AC
  Filled 2018-04-16: qty 2

## 2018-04-16 MED ORDER — LIDOCAINE HCL (CARDIAC) PF 100 MG/5ML IV SOSY
PREFILLED_SYRINGE | INTRAVENOUS | Status: DC | PRN
  Administered 2018-04-16: 100 mg via INTRAVENOUS

## 2018-04-16 MED ORDER — SUGAMMADEX SODIUM 200 MG/2ML IV SOLN
INTRAVENOUS | Status: DC | PRN
  Administered 2018-04-16: 200 mg via INTRAVENOUS

## 2018-04-16 MED ORDER — DEXAMETHASONE SODIUM PHOSPHATE 10 MG/ML IJ SOLN
INTRAMUSCULAR | Status: AC
  Filled 2018-04-16: qty 1

## 2018-04-16 MED ORDER — BUPIVACAINE HCL (PF) 0.5 % IJ SOLN
INTRAMUSCULAR | Status: AC
  Filled 2018-04-16: qty 10

## 2018-04-16 MED ORDER — ONDANSETRON HCL 4 MG/2ML IJ SOLN
INTRAMUSCULAR | Status: AC
  Filled 2018-04-16: qty 2

## 2018-04-16 MED ORDER — CHLORHEXIDINE GLUCONATE CLOTH 2 % EX PADS: 6.0000 | MEDICATED_PAD | Freq: Once | CUTANEOUS | Status: DC

## 2018-04-16 MED ORDER — ONDANSETRON HCL 4 MG PO TABS: 4.0000 mg | ORAL_TABLET | Freq: Three times a day (TID) | ORAL | 0 refills | Status: DC | PRN

## 2018-04-16 MED ORDER — EPINEPHRINE 30 MG/30ML IJ SOLN
INTRAMUSCULAR | Status: AC
  Filled 2018-04-16: qty 1

## 2018-04-16 MED ORDER — SUGAMMADEX SODIUM 200 MG/2ML IV SOLN
INTRAVENOUS | Status: AC
  Filled 2018-04-16: qty 2

## 2018-04-16 MED ORDER — SODIUM CHLORIDE 0.9 % IV SOLN
INTRAVENOUS | Status: DC | PRN
  Administered 2018-04-16: 50 ug/min via INTRAVENOUS

## 2018-04-16 MED ORDER — LACTATED RINGERS IV SOLN
INTRAVENOUS | Status: DC | PRN
  Administered 2018-04-16: 16 mL

## 2018-04-16 MED ORDER — MIDAZOLAM HCL 2 MG/2ML IJ SOLN
INTRAMUSCULAR | Status: AC
  Administered 2018-04-16: 2 mg via INTRAVENOUS
  Filled 2018-04-16: qty 2

## 2018-04-16 MED ORDER — ROCURONIUM BROMIDE 100 MG/10ML IV SOLN
INTRAVENOUS | Status: DC | PRN
  Administered 2018-04-16: 10 mg via INTRAVENOUS
  Administered 2018-04-16: 40 mg via INTRAVENOUS
  Administered 2018-04-16: 10 mg via INTRAVENOUS

## 2018-04-16 MED ORDER — ACETAMINOPHEN 10 MG/ML IV SOLN
INTRAVENOUS | Status: AC
  Filled 2018-04-16: qty 100

## 2018-04-16 MED ORDER — ONDANSETRON HCL 4 MG/2ML IJ SOLN
INTRAMUSCULAR | Status: DC | PRN
  Administered 2018-04-16: 4 mg via INTRAVENOUS

## 2018-04-16 MED ORDER — CEFAZOLIN SODIUM-DEXTROSE 2-4 GM/100ML-% IV SOLN
INTRAVENOUS | Status: AC
  Filled 2018-04-16: qty 100

## 2018-04-16 MED ORDER — FAMOTIDINE 20 MG PO TABS
20.0000 mg | ORAL_TABLET | Freq: Once | ORAL | Status: AC
  Administered 2018-04-16: 20 mg via ORAL

## 2018-04-16 MED ORDER — MIDAZOLAM HCL 2 MG/2ML IJ SOLN
2.0000 mg | Freq: Once | INTRAMUSCULAR | Status: AC
  Administered 2018-04-16: 2 mg via INTRAVENOUS

## 2018-04-16 MED ORDER — SUCCINYLCHOLINE CHLORIDE 20 MG/ML IJ SOLN
INTRAMUSCULAR | Status: DC | PRN
  Administered 2018-04-16: 100 mg via INTRAVENOUS

## 2018-04-16 MED ORDER — PROPOFOL 10 MG/ML IV BOLUS
INTRAVENOUS | Status: AC
  Filled 2018-04-16: qty 20

## 2018-04-16 MED ORDER — PHENYLEPHRINE HCL 10 MG/ML IJ SOLN
INTRAMUSCULAR | Status: AC
  Filled 2018-04-16: qty 1

## 2018-04-16 MED ORDER — FENTANYL CITRATE (PF) 100 MCG/2ML IJ SOLN
INTRAMUSCULAR | Status: AC
  Administered 2018-04-16: 50 ug via INTRAVENOUS
  Filled 2018-04-16: qty 2

## 2018-04-16 MED ORDER — BUPIVACAINE LIPOSOME 1.3 % IJ SUSP
INTRAMUSCULAR | Status: DC | PRN
  Administered 2018-04-16: 15 mL

## 2018-04-16 MED ORDER — ROCURONIUM BROMIDE 50 MG/5ML IV SOLN
INTRAVENOUS | Status: AC
  Filled 2018-04-16: qty 1

## 2018-04-16 MED ORDER — PROPOFOL 10 MG/ML IV BOLUS
INTRAVENOUS | Status: DC | PRN
  Administered 2018-04-16: 150 mg via INTRAVENOUS

## 2018-04-16 MED ORDER — FAMOTIDINE 20 MG PO TABS
ORAL_TABLET | ORAL | Status: AC
  Administered 2018-04-16: 20 mg via ORAL
  Filled 2018-04-16: qty 1

## 2018-04-16 MED ORDER — PHENYLEPHRINE HCL 10 MG/ML IJ SOLN
INTRAMUSCULAR | Status: DC | PRN
  Administered 2018-04-16 (×6): 100 ug via INTRAVENOUS

## 2018-04-16 MED ORDER — OXYCODONE HCL 5 MG PO TABS: 5.0000 mg | ORAL_TABLET | ORAL | 0 refills | Status: DC | PRN

## 2018-04-16 MED ORDER — ONDANSETRON HCL 4 MG/2ML IJ SOLN
INTRAMUSCULAR | Status: AC
  Administered 2018-04-16: 4 mg via INTRAVENOUS
  Filled 2018-04-16: qty 2

## 2018-04-16 MED ORDER — BUPIVACAINE HCL (PF) 0.25 % IJ SOLN
INTRAMUSCULAR | Status: DC | PRN
  Administered 2018-04-16: 30 mL

## 2018-04-16 MED ORDER — BUPIVACAINE HCL (PF) 0.5 % IJ SOLN
INTRAMUSCULAR | Status: DC | PRN
  Administered 2018-04-16: 10 mL

## 2018-04-16 MED ORDER — FENTANYL CITRATE (PF) 100 MCG/2ML IJ SOLN: 25.0000 ug | INTRAMUSCULAR | Status: DC | PRN

## 2018-04-16 MED ORDER — FENTANYL CITRATE (PF) 100 MCG/2ML IJ SOLN
50.0000 ug | Freq: Once | INTRAMUSCULAR | Status: AC
  Administered 2018-04-16: 50 ug via INTRAVENOUS

## 2018-04-16 MED ORDER — BUPIVACAINE LIPOSOME 1.3 % IJ SUSP
INTRAMUSCULAR | Status: AC
  Filled 2018-04-16: qty 20

## 2018-04-16 MED ORDER — DEXAMETHASONE SODIUM PHOSPHATE 10 MG/ML IJ SOLN
INTRAMUSCULAR | Status: DC | PRN
  Administered 2018-04-16: 5 mg via INTRAVENOUS

## 2018-04-16 MED ORDER — SODIUM CHLORIDE 0.9 % IV SOLN
INTRAVENOUS | Status: DC
  Administered 2018-04-16 (×2): via INTRAVENOUS

## 2018-04-16 MED ORDER — ACETAMINOPHEN 10 MG/ML IV SOLN
INTRAVENOUS | Status: DC | PRN
  Administered 2018-04-16: 1000 mg via INTRAVENOUS

## 2018-04-16 SURGICAL SUPPLY — 68 items
ADAPTER IRRIG TUBE 2 SPIKE SOL (ADAPTER) ×6 IMPLANT
ADPR TBG 2 SPK PMP STRL ASCP (ADAPTER) ×2
ANCH SUT 5.5 KNTLS PEEK (Orthopedic Implant) ×2 IMPLANT
ANCH SUT Q-FX 2.8 (Anchor) ×1 IMPLANT
ANCHOR ALL-SUT Q-FIX 2.8 (Anchor) ×2 IMPLANT
ANCHOR SUT 5.5 MULTIFIX (Orthopedic Implant) ×2 IMPLANT
ANCHOR SUT 5.5MM MULTIFIX (Orthopedic Implant) ×2 IMPLANT
BUR RADIUS 4.0X18.5 (BURR) ×3 IMPLANT
BUR RADIUS 5.5 (BURR) ×3 IMPLANT
CANNULA 5.75X7 CRYSTAL CLEAR (CANNULA) ×6 IMPLANT
CANNULA PARTIAL THREAD 2X7 (CANNULA) ×3 IMPLANT
CANNULA TWIST IN 8.25X9CM (CANNULA) IMPLANT
CLOSURE WOUND 1/2 X4 (GAUZE/BANDAGES/DRESSINGS) ×1
CONNECTOR PERFECT PASSER (CONNECTOR) ×2 IMPLANT
COOLER POLAR GLACIER W/PUMP (MISCELLANEOUS) ×3 IMPLANT
COVER WAND RF STERILE (DRAPES) ×3 IMPLANT
CRADLE LAMINECT ARM (MISCELLANEOUS) ×6 IMPLANT
DEVICE SUCT BLK HOLE OR FLOOR (MISCELLANEOUS) IMPLANT
DRAPE IMP U-DRAPE 54X76 (DRAPES) ×6 IMPLANT
DRAPE INCISE IOBAN 66X45 STRL (DRAPES) ×3 IMPLANT
DRAPE SHEET LG 3/4 BI-LAMINATE (DRAPES) ×3 IMPLANT
DRAPE U-SHAPE 47X51 STRL (DRAPES) IMPLANT
DURAPREP 26ML APPLICATOR (WOUND CARE) ×9 IMPLANT
ELECT REM PT RETURN 9FT ADLT (ELECTROSURGICAL) ×3
ELECTRODE REM PT RTRN 9FT ADLT (ELECTROSURGICAL) ×1 IMPLANT
GAUZE PETRO XEROFOAM 1X8 (MISCELLANEOUS) ×3 IMPLANT
GAUZE SPONGE 4X4 12PLY STRL (GAUZE/BANDAGES/DRESSINGS) ×3 IMPLANT
GLOVE BIOGEL PI IND STRL 9 (GLOVE) ×1 IMPLANT
GLOVE BIOGEL PI INDICATOR 9 (GLOVE) ×2
GLOVE SURG 9.0 ORTHO LTXF (GLOVE) ×6 IMPLANT
GOWN STRL REUS TWL 2XL XL LVL4 (GOWN DISPOSABLE) ×3 IMPLANT
IV LACTATED RINGER IRRG 3000ML (IV SOLUTION) ×48
IV LR IRRIG 3000ML ARTHROMATIC (IV SOLUTION) ×6 IMPLANT
KIT INSERTION 2.9 PUSHLOCK (KITS) ×3 IMPLANT
KIT STABILIZATION SHOULDER (MISCELLANEOUS) ×3 IMPLANT
KIT SUTURE 2.8 Q-FIX DISP (MISCELLANEOUS) ×2 IMPLANT
KIT TURNOVER KIT A (KITS) ×3 IMPLANT
MANIFOLD NEPTUNE II (INSTRUMENTS) ×3 IMPLANT
MASK FACE SPIDER DISP (MASK) ×3 IMPLANT
MAT ABSORB  FLUID 56X50 GRAY (MISCELLANEOUS) ×4
MAT ABSORB FLUID 56X50 GRAY (MISCELLANEOUS) ×2 IMPLANT
NEEDLE HYPO 22GX1.5 SAFETY (NEEDLE) ×3 IMPLANT
PACK ARTHROSCOPY SHOULDER (MISCELLANEOUS) ×3 IMPLANT
PAD WRAPON POLAR SHDR XLG (MISCELLANEOUS) ×1 IMPLANT
PASSER SUT CAPTURE FIRST (SUTURE) ×3 IMPLANT
SET TUBE SUCT SHAVER OUTFL 24K (TUBING) ×3 IMPLANT
SET TUBE TIP INTRA-ARTICULAR (MISCELLANEOUS) ×3 IMPLANT
STRAP SAFETY 5IN WIDE (MISCELLANEOUS) ×3 IMPLANT
STRIP CLOSURE SKIN 1/2X4 (GAUZE/BANDAGES/DRESSINGS) ×2 IMPLANT
SUT ETHILON 4-0 (SUTURE) ×3
SUT ETHILON 4-0 FS2 18XMFL BLK (SUTURE) ×1
SUT MNCRL 4-0 (SUTURE) ×3
SUT MNCRL 4-0 27XMFL (SUTURE) ×1
SUT PDS AB 0 CT1 27 (SUTURE) IMPLANT
SUT PERFECTPASSER WHITE CART (SUTURE) ×6 IMPLANT
SUT SMART STITCH CARTRIDGE (SUTURE) ×2 IMPLANT
SUT VIC AB 0 CT1 36 (SUTURE) IMPLANT
SUT VIC AB 2-0 CT2 27 (SUTURE) IMPLANT
SUTURE ETHLN 4-0 FS2 18XMF BLK (SUTURE) ×1 IMPLANT
SUTURE MNCRL 4-0 27XMF (SUTURE) ×1 IMPLANT
SUTURE TAPE FIBERLINK 1.3 LOOP (SUTURE) ×1 IMPLANT
SUTURETAPE FIBERLINK 1.3 LOOP (SUTURE) ×3
TAPE MICROFOAM 4IN (TAPE) ×3 IMPLANT
TUBING ARTHRO INFLOW-ONLY STRL (TUBING) ×3 IMPLANT
TUBING CONNECTING 10 (TUBING) ×2 IMPLANT
TUBING CONNECTING 10' (TUBING) ×1
WAND HAND CNTRL MULTIVAC 90 (MISCELLANEOUS) ×3 IMPLANT
WRAPON POLAR PAD SHDR XLG (MISCELLANEOUS) ×3

## 2018-04-16 NOTE — Anesthesia Procedure Notes (Signed)
Anesthesia Regional Block: Interscalene brachial plexus block   Pre-Anesthetic Checklist: ,, timeout performed, Correct Patient, Correct Site, Correct Laterality, Correct Procedure, Correct Position, site marked, Risks and benefits discussed,  Surgical consent,  Pre-op evaluation,  At surgeon's request and post-op pain management  Laterality: Right  Prep: Maximum Sterile Barrier Precautions used, chloraprep, alcohol swabs       Needles:  Injection technique: Single-shot  Needle Type: Stimiplex     Needle Length: 5cm  Needle Gauge: 22     Additional Needles:   Procedures:, nerve stimulator,,, ultrasound used (permanent image in chart),,,,   Nerve Stimulator or Paresthesia:  Response: biceps flexion, 0.4 mA,   Additional Responses:   Narrative:  Start time: 04/16/2018 12:45 PM End time: 04/16/2018 12:55 PM Injection made incrementally with aspirations every 5 mL.  Performed by: Personally  Anesthesiologist: Alvin Critchley, MD  Additional Notes: Functioning IV was confirmed and monitors were applied.  A 34mm 22ga Stimuplex needle was used. Sterile prep and drape,hand hygiene and sterile gloves were used.  Negative aspiration and negative test dose prior to incremental administration of local anesthetic. The patient tolerated the procedure well.  Easy injection of a total of 15 cc of Exparel + 10 cc of 0.5% marcaine.  Tip of the needle in view.  Patient experienced no pain on injection and no paresthesias.  Good stimulation of biceps tendon with fade at 0.4 mAmps.  Prior skin injection with 1% Lidocaine prior to block.

## 2018-04-16 NOTE — Transfer of Care (Signed)
Immediate Anesthesia Transfer of Care Note  Patient: Paul Spears  Procedure(s) Performed: ARTHROSCOPIC  BICEP TENOTOMY (Right )  Patient Location: PACU  Anesthesia Type:General  Level of Consciousness: awake  Airway & Oxygen Therapy: Patient connected to face mask oxygen  Post-op Assessment: Post -op Vital signs reviewed and stable  Post vital signs: stable  Last Vitals:  Vitals Value Taken Time  BP    Temp    Pulse    Resp 20 04/16/2018  5:47 PM  SpO2    Vitals shown include unvalidated device data.  Last Pain:  Vitals:   04/16/18 1057  TempSrc: Tympanic  PainSc: 0-No pain         Complications: No apparent anesthesia complications

## 2018-04-16 NOTE — Anesthesia Post-op Follow-up Note (Signed)
Anesthesia QCDR form completed.        

## 2018-04-16 NOTE — Discharge Instructions (Signed)
Interscalene Nerve Block with Exparel  1.  For your surgery you have received an Interscalene Nerve Block with Exparel. 2. Nerve Blocks affect many types of nerves, including nerves that control movement, pain and normal sensation.  You may experience feelings such as numbness, tingling, heaviness, weakness or the inability to move your arm or the feeling or sensation that your arm has "fallen asleep". 3. A nerve block with Exparel can last up to 5 days.  Usually the weakness wears off first.  The tingling and heaviness usually wear off next.  Finally you may start to notice pain.  Keep in mind that this may occur in any order.  Once a nerve block starts to wear off it is usually completely gone within 60 minutes. 4. ISNB may cause mild shortness of breath, a hoarse voice, blurry vision, unequal pupils, or drooping of the face on the same side as the nerve block.  These symptoms will usually resolve with the numbness.  Very rarely the procedure itself can cause mild seizures. 5. If needed, your surgeon will give you a prescription for pain medication.  It will take about 60 minutes for the oral pain medication to become fully effective.  So, it is recommended that you start taking this medication before the nerve block first begins to wear off, or when you first begin to feel discomfort. 6. Take your pain medication only as prescribed.  Pain medication can cause sedation and decrease your breathing if you take more than you need for the level of pain that you have. 7. Nausea is a common side effect of many pain medications.  You may want to eat something before taking your pain medicine to prevent nausea. 8. After an Interscalene nerve block, you cannot feel pain, pressure or extremes in temperature in the effected arm.  Because your arm is numb it is at an increased risk for injury.  To decrease the possibility of injury, please practice the following:  a. While you are awake change the position of  your arm frequently to prevent too much pressure on any one area for prolonged periods of time. b.  If you have a cast or tight dressing, check the color or your fingers every couple of hours.  Call your surgeon with the appearance of any discoloration (white or blue). c. If you are given a sling to wear before you go home, please wear it  at all times until the block has completely worn off.  Do not get up at night without your sling. d. Please contact [facility] or your surgeon if you do not begin to regain sensation after 7 days from the surgery.  Anesthesia may be contacted by calling the Same Day Surgery Department, Mon. through Fri., 6 am to 4 pm at [phone].   e. If you experience any other problems or concerns, please contact your surgeon's office. f. If you experience severe or prolonged shortness of breath go to the nearest emergency department.   AMBULATORY SURGERY  DISCHARGE INSTRUCTIONS   1) The drugs that you were given will stay in your system until tomorrow so for the next 24 hours you should not:  A) Drive an automobile B) Make any legal decisions C) Drink any alcoholic beverage   2) You may resume regular meals tomorrow.  Today it is better to start with liquids and gradually work up to solid foods.  You may eat anything you prefer, but it is better to start with liquids, then soup   and crackers, and gradually work up to solid foods.   3) Please notify your doctor immediately if you have any unusual bleeding, trouble breathing, redness and pain at the surgery site, drainage, fever, or pain not relieved by medication.    4) Additional Instructions:        Please contact your physician with any problems or Same Day Surgery at [phone], Monday through Friday 6 am to 4 pm, or  at [facility] Main number at [phone]. 

## 2018-04-16 NOTE — Anesthesia Preprocedure Evaluation (Addendum)
Anesthesia Evaluation  Patient identified by MRN, date of birth, ID band Patient awake    Reviewed: Allergy & Precautions, H&P , NPO status , Patient's Chart, lab work & pertinent test results  History of Anesthesia Complications Negative for: history of anesthetic complications  Airway Mallampati: III  TM Distance: <3 FB Neck ROM: limited    Dental  (+) Poor Dentition, Chipped, Caps   Pulmonary shortness of breath and with exertion,    Pulmonary exam normal breath sounds clear to auscultation       Cardiovascular Exercise Tolerance: Good hypertension, (-) angina(-) Past MI and (-) DOE Normal cardiovascular exam Rhythm:regular Rate:Normal     Neuro/Psych negative neurological ROS  negative psych ROS   GI/Hepatic Neg liver ROS, GERD  Medicated and Controlled,  Endo/Other  diabetes, Type 2  Renal/GU Renal InsufficiencyRenal disease  negative genitourinary   Musculoskeletal  (+) Arthritis ,   Abdominal   Peds  Hematology negative hematology ROS (+) anemia ,   Anesthesia Other Findings Past Medical History: No date: Diabetes mellitus without complication (HCC) No date: Hyperlipidemia No date: Hypertension No date: Kidney disease, chronic, stage III (GFR 30-59 *  Past Surgical History: No date: CARPAL TUNNEL RELEASE Right No date: FRACTURE SURGERY  BMI    Body Mass Index:  29.57 kg/m      Reproductive/Obstetrics negative OB ROS                             Anesthesia Physical  Anesthesia Plan  ASA: III  Anesthesia Plan: General   Post-op Pain Management:  Regional for Post-op pain   Induction: Intravenous  PONV Risk Score and Plan:   Airway Management Planned: Oral ETT  Additional Equipment:   Intra-op Plan:   Post-operative Plan: Extubation in OR  Informed Consent: I have reviewed the patients History and Physical, chart, labs and discussed the procedure including  the risks, benefits and alternatives for the proposed anesthesia with the patient or authorized representative who has indicated his/her understanding and acceptance.   Dental Advisory Given  Plan Discussed with: Anesthesiologist, CRNA and Surgeon  Anesthesia Plan Comments: (Patient consented for risks of anesthesia including but not limited to:  - adverse reactions to medications - damage to teeth, lips or other oral mucosa - sore throat or hoarseness - Damage to heart, brain, lungs or loss of life  Patient voiced understanding.)       Anesthesia Quick Evaluation

## 2018-04-16 NOTE — Anesthesia Postprocedure Evaluation (Signed)
Anesthesia Post Note  Patient: Paul Spears  Procedure(s) Performed: ARTHROSCOPIC  BICEP TENOTOMY (Right )  Patient location during evaluation: PACU Anesthesia Type: General Level of consciousness: awake and alert Pain management: pain level controlled Vital Signs Assessment: post-procedure vital signs reviewed and stable Respiratory status: spontaneous breathing, nonlabored ventilation, respiratory function stable and patient connected to nasal cannula oxygen Cardiovascular status: blood pressure returned to baseline and stable Postop Assessment: no apparent nausea or vomiting Anesthetic complications: no     Last Vitals:  Vitals:   04/16/18 1848 04/16/18 1936  BP: 105/69 113/66  Pulse: 92 96  Resp: 20 20  Temp: 36.4 C 36.6 C  SpO2: 98% 97%    Last Pain:  Vitals:   04/16/18 1936  TempSrc: Temporal  PainSc:                  Martha Clan

## 2018-04-16 NOTE — Progress Notes (Signed)
ECG obtained per MD order.

## 2018-04-16 NOTE — H&P (Signed)
PREOPERATIVE H&P  Chief Complaint: TENDINOSIS OF RIGHT BICEPS BRACHII, POSSIBLE ROTATOR CUFF TEAR  HPI: Paul Spears is a 72 y.o. male who presents for preoperative history and physical with a diagnosis of TENDINOSIS OF RIGHT BICEPS BRACHII and possible rotator cuff tear. Symptoms are rated as moderate to severe, and have been worsening.  This is significantly impairing activities of daily living.  He has elected for surgical management.   Past Medical History:  Diagnosis Date  . Diabetes mellitus without complication ([place])   . GERD (gastroesophageal reflux disease)   . Hyperlipidemia   . Hypertension   . Iron deficiency anemia 11/11/2017  . Kidney disease, chronic, stage III (GFR 30-59 ml/min) (HCC)    Past Surgical History:  Procedure Laterality Date  . CARPAL TUNNEL RELEASE Right   . ESOPHAGOGASTRODUODENOSCOPY (EGD) WITH PROPOFOL N/A 10/22/2016   Procedure: ESOPHAGOGASTRODUODENOSCOPY (EGD) WITH PROPOFOL;  Surgeon: Lollie Sails, MD;  Location: [facility] ENDOSCOPY;  Service: Endoscopy;  Laterality: N/A;  . FRACTURE SURGERY Left    arm   Social History   Socioeconomic History  . Marital status: Divorced    Spouse name: Not on file  . Number of children: Not on file  . Years of education: Not on file  . Highest education level: Not on file  Occupational History  . Not on file  Social Needs  . Financial resource strain: Not on file  . Food insecurity:    Worry: Not on file    Inability: Not on file  . Transportation needs:    Medical: Not on file    Non-medical: Not on file  Tobacco Use  . Smoking status: Never Smoker  . Smokeless tobacco: Never Used  Substance and Sexual Activity  . Alcohol use: No  . Drug use: No  . Sexual activity: Not Currently  Lifestyle  . Physical activity:    Days per week: Not on file    Minutes per session: Not on file  . Stress: Not on file  Relationships  . Social connections:    Talks on phone: Not on file    Gets together: Not on  file    Attends religious service: Not on file    Active member of club or organization: Not on file    Attends meetings of clubs or organizations: Not on file    Relationship status: Not on file  Other Topics Concern  . Not on file  Social History Narrative  . Not on file   Family History  Problem Relation Age of Onset  . Hypertension Father   . Heart attack Father   . Heart disease Sister   . Hypertension Sister    No Known Allergies Prior to Admission medications   Medication Sig Start Date End Date Taking? Authorizing Provider  atorvastatin (LIPITOR) 80 MG tablet Take 1 tablet (80 mg total) by mouth daily. 01/13/18  Yes Lada, Satira Anis, MD  Cholecalciferol (VITAMIN D3 PO) Take 1 capsule by mouth daily.   Yes [provider]  esomeprazole (NEXIUM) 20 MG capsule Take 20 mg by mouth daily.  05/22/14  Yes Roselee Nova, MD  ezetimibe (ZETIA) 10 MG tablet Take 1 tablet (10 mg total) by mouth daily. 01/13/18  Yes Lada, Satira Anis, MD  fluticasone (FLONASE) 50 MCG/ACT nasal spray Place 2 sprays into both nostrils daily as needed. Patient taking differently: Place 2 sprays into both nostrils daily.  10/28/17  Yes Lada, Satira Anis, MD  hydrochlorothiazide (HYDRODIURIL) 12.5 MG tablet  Take 12.5 mg by mouth daily.    Yes [provider]  losartan (COZAAR) 50 MG tablet Take 50 mg by mouth daily.   Yes [provider]  TRULICITY 2.09 OB/0.9GG SOPN  INJECT ONE SYRINGEFUL INTO THE SKIN ONCE WEEKLY FOR DIABETES STOP [place] Patient taking differently: Inject 0.75 mg into the skin every Sunday.  01/16/18  Yes Lada, Satira Anis, MD  Blood Glucose Monitoring Suppl (ACCU-CHEK AVIVA) device USE AS DIRECTED 01/04/16   Roselee Nova, MD  Blood Glucose Monitoring Suppl (ACCU-CHEK AVIVA) device USE AS DIRECTED 01/09/16   Roselee Nova, MD  glimepiride (AMARYL) 4 MG tablet TAKE 1 TABLET BY MOUTH ONCE DAILY WITH BREAKFAST 04/16/18   Hubbard Hartshorn, FNP  glucose blood (ACCU-CHEK  AVIVA PLUS) test strip USE AS DIRECTED - may check glucose 1-2 times daily. Patient not taking: Reported on 04/03/2018 05/02/17   Hubbard Hartshorn, FNP  metFORMIN (GLUCOPHAGE) 1000 MG tablet TAKE 1 TABLET BY MOUTH TWICE DAILY WITH MEALS 04/16/18   Hubbard Hartshorn, FNP     Positive ROS: All other systems have been reviewed and were otherwise negative with the exception of those mentioned in the HPI and as above.  Physical Exam: General: Alert, no acute distress Cardiovascular: Regular rate and rhythm, no murmurs rubs or gallops.  No pedal edema Respiratory: Clear to auscultation bilaterally, no wheezes rales or rhonchi. No cyanosis, no use of accessory musculature GI: No organomegaly, abdomen is soft and non-tender nondistended with positive bowel sounds. Skin: Skin intact, no lesions within the operative field. Neurologic: Sensation intact distally Psychiatric: Patient is competent for consent with normal mood and affect Lymphatic: No cervical lymphadenopathy  MUSCULOSKELETAL: Right upper extremity  Assessment: TENDINOSIS OF RIGHT BICEPS BRACHII, POSSIBLE ROTATOR CUFF TEAR  Plan: Plan for Procedure(s): ARTHROSCOPIC  BICEP TENOTOMY RIGHT SHOULDER WITH POSSIBLE MINI-OPEN ROTATOR CUFF TEAR.  Reviewed the details of the operation with the patient.  We discussed that the biceps tendon would be examined in either a tenotomy or tenodesis performed based on the condition of the tendon.  Patient will also have his rotator cuff inspected as his MRI suggested the possibility of a high-grade partial-thickness tear.  The rotator cuff will be repaired if necessary.  I discussed the postoperative course and how this would change depending on what was done during surgery.  He understands that rotator cuff repair surgery will take 4 to 6 months to recover from.  I discussed the risks and benefits of surgery. The risks include but are not limited to infection, bleeding, nerve or blood vessel injury, joint  stiffness or loss of motion, persistent pain, weakness or instability, re-tear of the rotator cuff or biceps tendon and hardware failure and the need for further surgery. Medical risks include but are not limited to DVT and pulmonary embolism, myocardial infarction, stroke, pneumonia, respiratory failure and death. Patient understood these risks and wished to proceed.    Thornton Park, MD   04/16/2018 2:04 PM

## 2018-04-16 NOTE — Anesthesia Procedure Notes (Addendum)
Procedure Name: Intubation Date/Time: 04/16/2018 2:47 PM Performed by: Chanetta Marshall, CRNA Pre-anesthesia Checklist: Patient identified, Emergency Drugs available, Suction available and Patient being monitored Patient Re-evaluated:Patient Re-evaluated prior to induction Oxygen Delivery Method: Circle system utilized Preoxygenation: Pre-oxygenation with 100% oxygen Induction Type: IV induction Ventilation: Mask ventilation without difficulty Laryngoscope Size: McGraph and 3 Grade View: Grade II Number of attempts: 2 Airway Equipment and Method: Stylet and Video-laryngoscopy Placement Confirmation: ETT inserted through vocal cords under direct vision,  positive ETCO2,  CO2 detector and breath sounds checked- equal and bilateral Secured at: 21 cm Tube secured with: Tape Dental Injury: Teeth and Oropharynx as per pre-operative assessment

## 2018-04-16 NOTE — Op Note (Signed)
04/16/2018  6:10 PM  PATIENT:  Paul Spears  72 y.o. male  PRE-OPERATIVE DIAGNOSIS:  TENDINOSIS OF RIGHT BICEPS BRACHII with possible supraspinatus rotator cuff tear   POST-OPERATIVE DIAGNOSIS:  PARTIAL TEAR OF RIGHT BICEPS BRACHII TENDON, SLAP TEAR, HIGH GRADE PARTIAL/NEAR FULL THICKNESS TEAR OF SUPRASPINATUS, ACROMIOCLAVICULAR ARTHROSIS  PROCEDURE:  Procedure(s): RIGHT SHOULDER ARTHROSCOPIC  BICEPS TENOTOMY, SUBACROMIAL DECOMPRESSION, DISTAL CLAVICLE EXCISION AND MINI-OPEN ROTATOR CUFF TEAR  SURGEON:  Surgeon(s) and Role:    Thornton Park, MD - Primary  ANESTHESIA:   local, general and paracervical block   PREOPERATIVE INDICATIONS:  Paul Spears is a  72 y.o. male with a diagnosis of TENDINOSIS OF RIGHT BICEPS BRACHII and possible rotator cuff tear who failed conservative treatment and elected for surgical management.    The risks benefits and alternatives were discussed with the patient preoperatively including but not limited to the risks of infection, bleeding, nerve injury, persistent pain or weakness, failure of the hardware, re-tear of the rotator cuff and the need for further surgery. Medical risks include DVT and pulmonary embolism, myocardial infarction, stroke, pneumonia, respiratory failure and death. Patient understood these risks and wished to proceed.  OPERATIVE IMPLANTS: [place] Multifix anchors x 2 & Smith [employer] Nephew Q Fix anchor x 1  OPERATIVE PROCEDURE: The patient was met in the preoperative area.  A preop history and physical was performed.  The right shoulder was signed with the word yes and my initials according the hospital's correct site of surgery protocol.  Patient underwent an interscalene block with Exparel by the anesthesia service in the preop area.  The patient was then brought to the OR and underwent  general endotracheal intubation by the anesthesia service.  The patient was placed in a beachchair position. A spider arm positioner was  used for this case. Examination under anesthesia revealed full passive range of motion and no instability to load-and-shift testing.  The patient was prepped and draped in a sterile fashion. A timeout was performed to verify the patient's name, date of birth, medical record number, correct site of surgery and correct procedure to be performed there was also used to verify the patient received antibiotics that all appropriate instruments, implants and radiographs studies were available in the room. Once all in attendance were in agreement case began.  Bony landmarks were drawn out with a surgical marker along with proposed arthroscopy incisions. These were pre-injected with 1% lidocaine plain. An 11 blade was used to establish a posterior portal through which the arthroscope was placed in the glenohumeral joint. A full diagnostic examination of the shoulder was performed. The anterior portal was established under direct visualization with an 18-gauge spinal needle.  A 5.75 mm arthroscopic cannula was placed through the anterior portal.   The intra-articular portion of the biceps tendon was found to have a partial tear involving greater than 50% of the diameter as well as a SLAP tear. Therefore the decision was made to perform a tenotomy. An arthroscopic scissor was used to release the biceps tendon off the superior labrum. The arthroscopic shaver was then used to debride the frayed edges of the superior labrum and biceps stump.  Subscapularis tendon had intrasubstance tearing but there was no thickness tear from the lesser tuberosity. Patient had a high-grade partial thickness tear, which was nearly full-thickness, involving the supraspinatus. There were no loose bodies within the inferior recess and no evidence of HAGL lesion.  The arthroscope was then placed in the subacromial space. A  lateral portal was then established using an 18-gauge spinal needle for localization.   The greater tuberosity was  debrided using a 5.5 mm resector shaver blade to remove all remaining foreign fibers of the rotator cuff.  Debridement was performed until punctate bleeding was seen at the greater tuberosity footprint, which will allow for rotator cuff healing.  Extensive bursitis was encountered and debrided using a 4-0 resector shaver blade and a 90 ArthroCare wand from the lateral portal. A subacromial decompression was also performed using a 5.5 mm resector shaver blade from the lateral portal. The 5.5 mm resector shaver blade was then placed through the anterior portal and distal clavicle excision was performed. Two ArthroCare Perfect Pass sutures were placed in the lateral border of the rotator cuff tear. All arthroscopic instruments were then removed and the mini-open portion of the procedure began.   A saber-type incision was made along the lateral border of the acromion. The deltoid muscle was identified and split in line with its fibers which allowed visualization of the rotator cuff. The Perfect Pass sutures previously placed in the lateral border of the rotator cuff werealso brought out through the deltoid split. A single Q-Fix anchor was then placed at the articular margin of the humeral head and greater tuberosity. The four suture limbs of  Q Fix anchor was passed medially through the rotator cuff using a first pass suture passer. The Perfect Pass sutures from the lateral border of the rotator cuff were then anchored to thegreater tuberosity of the humeral head using two [place] Mutlifix anchors. These anchors were tensioned to allow for anatomic reduction of the rotator cuff to the greater tuberosity footprint. The medial row repair was then completed using an arthroscopic knot tying technique with the Q fix anchor sutures. Once all sutures were tied down, arthroscopic images of the double row repair were taken with the arthroscope both externally and arthroscopically fromthe glenohumeral  joint  All incisions were copiously irrigated. The deltoid fascia was repaired using a 0 Vicryl suturean interrupted fashion. The subcutaneous tissue of all incisions were closed with a 2-0 Vicryl. Skin closure for the arthroscopic incisions was performed with 4-0 nylon. The skin edges of the saber incision were approximated with a running 4-0 undyed Monocryl. 0.25%  Marcaine plain was injected into the subacromial space and at the injection sites.  A dry sterile dressing including Steri-Strips was applied . The patient was placed in an abduction sling, with a Polar Care sleeve.  All sharp and instrument counts were correct at the conclusion of the case. I was scrubbed and present for the entire case. I spoke with the patient's family in the post-op consultation room and informed them that the case had been performed without complication and the patient was stable in recovery room.     Timoteo Gaul, MD

## 2018-04-16 NOTE — Telephone Encounter (Signed)
Documentation reviewed; has appt 04/30/18 with PCP Dr. Sanda Klein - 30-day supply provided.

## 2018-04-17 ENCOUNTER — Encounter: Payer: Self-pay | Admitting: Orthopedic Surgery

## 2018-04-20 ENCOUNTER — Other Ambulatory Visit: Payer: Self-pay | Admitting: Family Medicine

## 2018-04-20 ENCOUNTER — Other Ambulatory Visit: Payer: Self-pay | Admitting: Oncology

## 2018-04-20 DIAGNOSIS — IMO0002 Reserved for concepts with insufficient information to code with codable children: Secondary | ICD-10-CM

## 2018-04-20 DIAGNOSIS — E1122 Type 2 diabetes mellitus with diabetic chronic kidney disease: Secondary | ICD-10-CM

## 2018-04-20 DIAGNOSIS — N183 Chronic kidney disease, stage 3 (moderate): Principal | ICD-10-CM

## 2018-04-20 DIAGNOSIS — E1165 Type 2 diabetes mellitus with hyperglycemia: Principal | ICD-10-CM

## 2018-04-21 ENCOUNTER — Inpatient Hospital Stay: Payer: Medicare Other

## 2018-04-22 ENCOUNTER — Ambulatory Visit: Payer: Medicare Other

## 2018-04-22 ENCOUNTER — Ambulatory Visit: Payer: Medicare Other | Admitting: Oncology

## 2018-04-23 ENCOUNTER — Ambulatory Visit: Payer: Medicare Other

## 2018-04-30 ENCOUNTER — Ambulatory Visit (INDEPENDENT_AMBULATORY_CARE_PROVIDER_SITE_OTHER): Payer: Medicare Other | Admitting: Family Medicine

## 2018-04-30 ENCOUNTER — Encounter: Payer: Self-pay | Admitting: Family Medicine

## 2018-04-30 VITALS — BP 118/80 | HR 89 | Temp 97.7°F | Ht 71.0 in | Wt 196.2 lb

## 2018-04-30 DIAGNOSIS — K22719 Barrett's esophagus with dysplasia, unspecified: Secondary | ICD-10-CM

## 2018-04-30 DIAGNOSIS — R748 Abnormal levels of other serum enzymes: Secondary | ICD-10-CM

## 2018-04-30 DIAGNOSIS — D500 Iron deficiency anemia secondary to blood loss (chronic): Secondary | ICD-10-CM | POA: Diagnosis not present

## 2018-04-30 DIAGNOSIS — K219 Gastro-esophageal reflux disease without esophagitis: Secondary | ICD-10-CM

## 2018-04-30 DIAGNOSIS — E782 Mixed hyperlipidemia: Secondary | ICD-10-CM

## 2018-04-30 DIAGNOSIS — E1122 Type 2 diabetes mellitus with diabetic chronic kidney disease: Secondary | ICD-10-CM | POA: Diagnosis not present

## 2018-04-30 DIAGNOSIS — E1165 Type 2 diabetes mellitus with hyperglycemia: Secondary | ICD-10-CM

## 2018-04-30 DIAGNOSIS — IMO0002 Reserved for concepts with insufficient information to code with codable children: Secondary | ICD-10-CM

## 2018-04-30 DIAGNOSIS — N183 Chronic kidney disease, stage 3 unspecified: Secondary | ICD-10-CM

## 2018-04-30 DIAGNOSIS — R809 Proteinuria, unspecified: Secondary | ICD-10-CM

## 2018-04-30 NOTE — Assessment & Plan Note (Signed)
>>  ASSESSMENT AND PLAN FOR CHRONIC KIDNEY DISEASE (CKD), STAGE III (MODERATE) (HCC) WRITTEN ON 04/30/2018  7:55 AM BY LADA, MELINDA P, MD  Check labs; avoid NSAIDs

## 2018-04-30 NOTE — Assessment & Plan Note (Signed)
recheck

## 2018-04-30 NOTE — Assessment & Plan Note (Signed)
Check A1c today; patient requested no foot exam today; checking sugars daily; trying to watch diet

## 2018-04-30 NOTE — Patient Instructions (Addendum)
Please do call [facility] for monitoring your Barrett's and see if you need an EGD [phone]  I am so proud of your weight loss  Try to limit saturated fats in your diet (bologna, hot dogs, barbeque, cheeseburgers, hamburgers, steak, bacon, sausage, cheese, etc.) and get more fresh fruits, vegetables, and whole grains  If you need something for aches or pains, try to use Tylenol (acetaminophen) instead of non-steroidals (which include Aleve, ibuprofen, Advil, Motrin, and naproxen); non-steroidals can cause long-term kidney damage

## 2018-04-30 NOTE — Assessment & Plan Note (Signed)
Continue PPI ?

## 2018-04-30 NOTE — Assessment & Plan Note (Signed)
Patient to call [facility] and find out if due for EGD for monitoring Barrett's; continue PPI, avoid triggers

## 2018-04-30 NOTE — Progress Notes (Signed)
BP 118/80   Pulse 89   Temp 97.7 F (36.5 C)   Ht 5\' 11"  (1.803 m)   Wt 196 lb 3.2 oz (89 kg)   SpO2 98%   BMI 27.36 kg/m    Subjective:    Patient ID: Paul Spears, male    DOB: [birth year], 72 y.o.   MRN: [id number]  HPI: Paul Spears is a 72 y.o. male  Chief Complaint  Patient presents with  . Follow-up    HPI Patient is here for f/u  He just had shoulder surgery two weeks ago; not taking any pain pills  Type 2 diabetes; on metformin and trulicity and glimepiride; checking sugars every morning; 93 this morning and occasionally; no lows; losing weight  He bypassed foot exam, no problems with feet  High cholesterol; zetia plus atorvastatin; trying to limit fatty meats; not much meat any more; he had the Impossible Burger; not much grains; eats junk food; "I'm not married"; lots of beans  Lab Results  Component Value Date   CHOL 147 01/13/2018   HDL 40 (L) 01/13/2018   LDLCALC 85 01/13/2018   TRIG 119 01/13/2018   CHOLHDL 3.7 01/13/2018     Lab Results  Component Value Date   HGBA1C 6.9 (H) 01/28/2018   GERD; acts up every once in a while; occasional alka-seltzer; limiting orange juice and tomato juice; no blood in the stool; Barrett's esophagus   Depression screen [facility] 2/9 01/28/2018 10/28/2017 07/24/2017 07/10/2017 04/22/2017  Decreased Interest 0 0 0 0 0  Down, Depressed, Hopeless 0 0 0 0 0  PHQ - 2 Score 0 0 0 0 0   Fall Risk  04/30/2018 01/28/2018 11/21/2017 10/28/2017 07/24/2017  Falls in the past year? 1 No No Yes No  Number falls in past yr: 0 - - 1 -  Injury with Fall? 0 - - No -    Relevant past medical, surgical, family and social history reviewed Past Medical History:  Diagnosis Date  . Diabetes mellitus without complication ([place])   . GERD (gastroesophageal reflux disease)   . Hyperlipidemia   . Hypertension   . Iron deficiency anemia 11/11/2017  . Kidney disease, chronic, stage III (GFR 30-59 ml/min) (HCC)    Past Surgical History:    Procedure Laterality Date  . CARPAL TUNNEL RELEASE Right   . ESOPHAGOGASTRODUODENOSCOPY (EGD) WITH PROPOFOL N/A 10/22/2016   Procedure: ESOPHAGOGASTRODUODENOSCOPY (EGD) WITH PROPOFOL;  Surgeon: Lollie Sails, MD;  Location: [facility] ENDOSCOPY;  Service: Endoscopy;  Laterality: N/A;  . FRACTURE SURGERY Left    arm  . SHOULDER ARTHROSCOPY WITH SUBACROMIAL DECOMPRESSION AND BICEP TENDON REPAIR Right 04/16/2018   Procedure: ARTHROSCOPIC  BICEP TENOTOMY;  Surgeon: Thornton Park, MD;  Location: [facility] ORS;  Service: Orthopedics;  Laterality: Right;   Family History  Problem Relation Age of Onset  . Hypertension Father   . Heart attack Father   . Heart disease Sister   . Hypertension Sister    Social History   Tobacco Use  . Smoking status: Never Smoker  . Smokeless tobacco: Never Used  Substance Use Topics  . Alcohol use: No  . Drug use: No     Office Visit from 04/30/2018 in [facility]  AUDIT-C Score  0      Interim medical history since last visit reviewed. Allergies and medications reviewed  Review of Systems Per HPI unless specifically indicated above     Objective:    BP 118/80   Pulse 89  Temp 97.7 F (36.5 C)   Ht 5\' 11"  (1.803 m)   Wt 196 lb 3.2 oz (89 kg)   SpO2 98%   BMI 27.36 kg/m   Wt Readings from Last 3 Encounters:  04/30/18 196 lb 3.2 oz (89 kg)  04/16/18 203 lb 11.3 oz (92.4 kg)  04/09/18 203 lb 12.8 oz (92.4 kg)    Physical Exam  Constitutional: He appears well-developed and well-nourished. No distress.  Weight loss noted  HENT:  Head: Normocephalic and atraumatic.  Eyes: EOM are normal. No scleral icterus.  Neck: No thyromegaly present.  Cardiovascular: Normal rate and regular rhythm.  Pulmonary/Chest: Effort normal and breath sounds normal.  Abdominal: Soft. Bowel sounds are normal. He exhibits no distension.  Musculoskeletal: He exhibits no edema.  Neurological: Coordination normal.  Skin: Skin is warm and dry. No  pallor.  Somewhat pale today in the face but nailbeds are pink  Psychiatric: He has a normal mood and affect. His behavior is normal. Judgment and thought content normal. His mood appears not anxious. He does not exhibit a depressed mood.    Results for orders placed or performed during the hospital encounter of 04/16/18  Glucose, capillary  Result Value Ref Range   Glucose-Capillary 166 (H) 70 - 99 mg/dL  Glucose, capillary  Result Value Ref Range   Glucose-Capillary 159 (H) 70 - 99 mg/dL      Assessment & Plan:   Problem List Items Addressed This Visit      Digestive   GERD (gastroesophageal reflux disease)    Continue PPI      Barrett esophagus    Patient to call [facility] and find out if due for EGD for monitoring Barrett's; continue PPI, avoid triggers        Endocrine   Uncontrolled type 2 diabetes with stage 3 chronic kidney disease GFR 30-59 (HCC) - Primary    Check A1c today; patient requested no foot exam today; checking sugars daily; trying to watch diet      Relevant Orders   Lipid panel   Hemoglobin A1c     Genitourinary   Chronic kidney disease (CKD), stage III (moderate) (HCC)    Check labs; avoid NSAIDs        Other   Hyperlipidemia   Relevant Orders   Lipid panel   Microalbuminuria    Check urine today      Relevant Orders   Microalbumin / creatinine urine ratio   High alkaline phosphatase    recheck      Relevant Orders   COMPLETE METABOLIC PANEL WITH GFR   Absolute anemia    Patient will see GI; check CBC, ferritin; he did not get back to see hematologist      Relevant Orders   CBC with Differential/Platelet   Iron, TIBC and Ferritin Panel       Follow up plan: No follow-ups on file.  An after-visit summary was printed and given to the patient at [place].  Please see the patient instructions which may contain other information and recommendations beyond what is mentioned above in the assessment and plan.  No orders of the  defined types were placed in this encounter.   Orders Placed This Encounter  Procedures  . Microalbumin / creatinine urine ratio  . Lipid panel  . Hemoglobin A1c  . COMPLETE METABOLIC PANEL WITH GFR  . CBC with Differential/Platelet  . Iron, TIBC and Ferritin Panel

## 2018-04-30 NOTE — Assessment & Plan Note (Signed)
Check labs; avoid NSAIDs

## 2018-04-30 NOTE — Assessment & Plan Note (Signed)
Patient will see GI; check CBC, ferritin; he did not get back to see hematologist

## 2018-04-30 NOTE — Assessment & Plan Note (Signed)
Check urine today 

## 2018-04-30 NOTE — Assessment & Plan Note (Signed)
Check labs today; patient to see GI and hematologist

## 2018-05-01 LAB — COMPLETE METABOLIC PANEL WITH GFR
AG Ratio: 1.8 (calc) (ref 1.0–2.5)
ALKALINE PHOSPHATASE (APISO): 124 U/L — AB (ref 40–115)
ALT: 19 U/L (ref 9–46)
AST: 19 U/L (ref 10–35)
Albumin: 4.7 g/dL (ref 3.6–5.1)
BUN/Creatinine Ratio: 16 (calc) (ref 6–22)
BUN: 24 mg/dL (ref 7–25)
CALCIUM: 9.7 mg/dL (ref 8.6–10.3)
CO2: 27 mmol/L (ref 20–32)
CREATININE: 1.54 mg/dL — AB (ref 0.70–1.18)
Chloride: 100 mmol/L (ref 98–110)
GFR, EST NON AFRICAN AMERICAN: 44 mL/min/{1.73_m2} — AB (ref >=60)
GFR, Est African American: 51 mL/min/{1.73_m2} — ABNORMAL LOW (ref >=60)
Globulin: 2.6 g/dL (calc) (ref 1.9–3.7)
Glucose, Bld: 142 mg/dL — ABNORMAL HIGH (ref 65–99)
POTASSIUM: 4.5 mmol/L (ref 3.5–5.3)
Sodium: 137 mmol/L (ref 135–146)
Total Bilirubin: 0.8 mg/dL (ref 0.2–1.2)
Total Protein: 7.3 g/dL (ref 6.1–8.1)

## 2018-05-01 LAB — LIPID PANEL
CHOLESTEROL: 105 mg/dL (ref <200)
HDL: 40 mg/dL — ABNORMAL LOW (ref >40)
LDL Cholesterol (Calc): 48 mg/dL (calc)
Non-HDL Cholesterol (Calc): 65 mg/dL (calc) (ref <130)
Total CHOL/HDL Ratio: 2.6 (calc) (ref <5.0)
Triglycerides: 87 mg/dL (ref <150)

## 2018-05-01 LAB — HEMOGLOBIN A1C
EAG (MMOL/L): 8.4 (calc)
HEMOGLOBIN A1C: 6.9 %{Hb} — AB (ref <5.7)
Mean Plasma Glucose: 151 (calc)

## 2018-05-01 LAB — CBC WITH DIFFERENTIAL/PLATELET
BASOS ABS: 89 {cells}/uL (ref 0–200)
BASOS PCT: 1.2 %
EOS PCT: 3.9 %
Eosinophils Absolute: 289 cells/uL (ref 15–500)
HEMATOCRIT: 39.7 % (ref 38.5–50.0)
HEMOGLOBIN: 13.6 g/dL (ref 13.2–17.1)
LYMPHS ABS: 1872 {cells}/uL (ref 850–3900)
MCH: 31.3 pg (ref 27.0–33.0)
MCHC: 34.3 g/dL (ref 32.0–36.0)
MCV: 91.3 fL (ref 80.0–100.0)
MPV: 10.1 fL (ref 7.5–12.5)
Monocytes Relative: 9.9 %
NEUTROS ABS: 4418 {cells}/uL (ref 1500–7800)
Neutrophils Relative %: 59.7 %
Platelets: 434 10*3/uL — ABNORMAL HIGH (ref 140–400)
RBC: 4.35 10*6/uL (ref 4.20–5.80)
RDW: 12.6 % (ref 11.0–15.0)
Total Lymphocyte: 25.3 %
WBC mixed population: 733 cells/uL (ref 200–950)
WBC: 7.4 10*3/uL (ref 3.8–10.8)

## 2018-05-01 LAB — IRON,TIBC AND FERRITIN PANEL
%SAT: 31 % (ref 20–48)
Ferritin: 137 ng/mL (ref 24–380)
IRON: 112 ug/dL (ref 50–180)
TIBC: 360 ug/dL (ref 250–425)

## 2018-05-01 LAB — MICROALBUMIN / CREATININE URINE RATIO
CREATININE, URINE: 342 mg/dL — AB (ref 20–320)
MICROALB UR: 2 mg/dL
MICROALB/CREAT RATIO: 6 ug/mg{creat} (ref <30)

## 2018-05-11 ENCOUNTER — Telehealth: Payer: Self-pay

## 2018-05-11 DIAGNOSIS — R748 Abnormal levels of other serum enzymes: Secondary | ICD-10-CM

## 2018-05-11 NOTE — Telephone Encounter (Signed)
-----  Message from Arnetha Courser, MD sent at 05/06/2018 10:50 PM EST ----- Joelene Millin - Please let the patient know that his urine microalbumin to creatinine ratio was normal, but his creatinine was a little elevated; avoid red meat, work on weight loss, limit salt, avoid NSAIDs His alkaline phosphatase is still above the normal cut-off; I'd like him to see Dr. Daphane Shepherd (REFER to GI, dx: elev alk phos) His A1c is still controlled, but keep trying to lose weight and eat better; LDL cholesterol is great Kidney function is fairly stable; please FAX results to nephrologist (all labs) He is no longer anemic and his iron studies are normal; good news

## 2018-05-12 ENCOUNTER — Telehealth: Payer: Self-pay

## 2018-05-12 ENCOUNTER — Encounter: Payer: Self-pay | Admitting: *Deleted

## 2018-05-12 NOTE — Telephone Encounter (Signed)
Pt returning call to go over lab results.

## 2018-05-12 NOTE — Telephone Encounter (Signed)
Copied from [place] [id number]. Topic: Conservator, museum/gallery Patient (Clinic Use ONLY) >> 05-29-2018 11:10 AM Sibyl Parr, CMA wrote: Reason for CRM:   Please let the patient know that his urine microalbumin to creatinine ratio was normal, but his creatinine was a little elevated; avoid red meat, work on weight loss, limit salt, avoid NSAIDs  His alkaline phosphatase is still above the normal cut-off; I'd like him to see Dr. Daphane Shepherd (REFER to GI, dx: elev alk phos)  His A1c is still controlled, but keep trying to lose weight and eat better; LDL cholesterol is great  Kidney function is fairly stable; please FAX results to nephrologist (all labs)  He is no longer anemic and his iron studies are normal; good news >> 05/29/18  2:18 PM Carolyn Stare wrote:   Pt returned call for his labs

## 2018-05-12 NOTE — Telephone Encounter (Signed)
Pt.notified

## 2018-05-12 NOTE — Telephone Encounter (Signed)
Called [phone] @ 3:51 and was not able to leave message. Pt need to schedule appt. Is able to see Paul Spears

## 2018-05-12 NOTE — Telephone Encounter (Signed)
He can be seen by Benjamine Mola :)

## 2018-05-15 ENCOUNTER — Other Ambulatory Visit: Payer: Self-pay | Admitting: Family Medicine

## 2018-05-15 DIAGNOSIS — E1165 Type 2 diabetes mellitus with hyperglycemia: Principal | ICD-10-CM

## 2018-05-15 DIAGNOSIS — N183 Chronic kidney disease, stage 3 (moderate): Principal | ICD-10-CM

## 2018-05-15 DIAGNOSIS — IMO0002 Reserved for concepts with insufficient information to code with codable children: Secondary | ICD-10-CM

## 2018-05-15 DIAGNOSIS — E1122 Type 2 diabetes mellitus with diabetic chronic kidney disease: Secondary | ICD-10-CM

## 2018-05-18 NOTE — Telephone Encounter (Signed)
I've decided to have patient STOP his metformin until I can talk to Dr. Juleen China about his kidney function and where to go from here Please FAX lab results to kidney doctor and ask him to call me when he has a minute about patient's metformin Please call patient and tell him to STOP metformin until I talk to Dr. Juleen China

## 2018-05-18 NOTE — Telephone Encounter (Signed)
Pt notified labs faxed

## 2018-05-19 ENCOUNTER — Telehealth: Payer: Self-pay | Admitting: Family Medicine

## 2018-05-19 NOTE — Telephone Encounter (Signed)
Paul Spears, please let the patient know that Dr. Juleen China wrote back; he says creatinine is stable He is comfortable continuing the metformin as long as his GFR stays above 30 Okay to go back on the metformin and take as he was taking before; thank you

## 2018-05-19 NOTE — Telephone Encounter (Signed)
Left detailed voicemail

## 2018-05-26 ENCOUNTER — Telehealth: Payer: Self-pay

## 2018-05-26 NOTE — Telephone Encounter (Signed)
Left msg for pt to contact office to reschedule medicare annual wellness visit. Thank you!

## 2018-06-01 ENCOUNTER — Other Ambulatory Visit: Payer: Self-pay | Admitting: Family Medicine

## 2018-06-01 DIAGNOSIS — IMO0002 Reserved for concepts with insufficient information to code with codable children: Secondary | ICD-10-CM

## 2018-06-01 DIAGNOSIS — E1122 Type 2 diabetes mellitus with diabetic chronic kidney disease: Secondary | ICD-10-CM

## 2018-06-01 DIAGNOSIS — N183 Chronic kidney disease, stage 3 (moderate): Principal | ICD-10-CM

## 2018-06-01 DIAGNOSIS — E1165 Type 2 diabetes mellitus with hyperglycemia: Principal | ICD-10-CM

## 2018-06-19 ENCOUNTER — Other Ambulatory Visit: Payer: Self-pay | Admitting: Family Medicine

## 2018-06-19 ENCOUNTER — Ambulatory Visit (INDEPENDENT_AMBULATORY_CARE_PROVIDER_SITE_OTHER): Payer: Medicare Other

## 2018-06-19 VITALS — BP 118/72 | HR 120 | Temp 98.0°F | Resp 16 | Ht 71.0 in | Wt 201.3 lb

## 2018-06-19 DIAGNOSIS — Z0000 Encounter for general adult medical examination without abnormal findings: Secondary | ICD-10-CM

## 2018-06-19 DIAGNOSIS — N183 Chronic kidney disease, stage 3 (moderate): Principal | ICD-10-CM

## 2018-06-19 DIAGNOSIS — E1122 Type 2 diabetes mellitus with diabetic chronic kidney disease: Secondary | ICD-10-CM

## 2018-06-19 DIAGNOSIS — IMO0002 Reserved for concepts with insufficient information to code with codable children: Secondary | ICD-10-CM

## 2018-06-19 DIAGNOSIS — E1165 Type 2 diabetes mellitus with hyperglycemia: Principal | ICD-10-CM

## 2018-06-19 MED ORDER — DULAGLUTIDE 0.75 MG/0.5ML ~~LOC~~ SOAJ: 1.0000 | SUBCUTANEOUS | 5 refills | Status: DC

## 2018-06-19 MED ORDER — GLIMEPIRIDE 4 MG PO TABS: 4.0000 mg | ORAL_TABLET | Freq: Every day | ORAL | 0 refills | Status: DC

## 2018-06-19 MED ORDER — METFORMIN HCL 1000 MG PO TABS: 1000.0000 mg | ORAL_TABLET | Freq: Two times a day (BID) | ORAL | 0 refills | Status: DC

## 2018-06-19 NOTE — Patient Instructions (Signed)
Paul Spears , Thank you for taking time to come for your Medicare Wellness Visit. I appreciate your ongoing commitment to your health goals. Please review the following plan we discussed and let me know if I can assist you in the future.   Screening recommendations/referrals: Colonoscopy: done 10/22/16 repeat next year Recommended yearly ophthalmology/optometry visit for glaucoma screening and checkup Recommended yearly dental visit for hygiene and checkup  Vaccinations: Influenza vaccine: done 03/06/18 Pneumococcal vaccine: done 03/06/18 Tdap vaccine: done 04/03/16 Shingles vaccine: Shingrix discussed. Please contact your pharmacy for coverage information.     Advanced directives: Advance directive discussed with you today. Even though you declined this today please call our office should you change your mind and we can give you the proper paperwork for you to fill out.  Conditions/risks identified: Recommend increase physical activity to 150 minutes per week.   Next appointment: Please follow up in one year for your Medicare Annual Wellness visit.    Preventive Care 28 Years and Older, Male Preventive care refers to lifestyle choices and visits with your health care provider that can promote health and wellness. What does preventive care include?  A yearly physical exam. This is also called an annual well check.  Dental exams once or twice a year.  Routine eye exams. Ask your health care provider how often you should have your eyes checked.  Personal lifestyle choices, including:  Daily care of your teeth and gums.  Regular physical activity.  Eating a healthy diet.  Avoiding tobacco and drug use.  Limiting alcohol use.  Practicing safe sex.  Taking low doses of aspirin every day.  Taking vitamin and mineral supplements as recommended by your health care provider. What happens during an annual well check? The services and screenings done by your health care provider  during your annual well check will depend on your age, overall health, lifestyle risk factors, and family history of disease. Counseling  Your health care provider may ask you questions about your:  Alcohol use.  Tobacco use.  Drug use.  Emotional well-being.  Home and relationship well-being.  Sexual activity.  Eating habits.  History of falls.  Memory and ability to understand (cognition).  Work and work Statistician. Screening  You may have the following tests or measurements:  Height, weight, and BMI.  Blood pressure.  Lipid and cholesterol levels. These may be checked every 5 years, or more frequently if you are over 86 years old.  Skin check.  Lung cancer screening. You may have this screening every year starting at age 53 if you have a 30-pack-year history of smoking and currently smoke or have quit within the past 15 years.  Fecal occult blood test (FOBT) of the stool. You may have this test every year starting at age 1.  Flexible sigmoidoscopy or colonoscopy. You may have a sigmoidoscopy every 5 years or a colonoscopy every 10 years starting at age 75.  Prostate cancer screening. Recommendations will vary depending on your family history and other risks.  Hepatitis C blood test.  Hepatitis B blood test.  Sexually transmitted disease (STD) testing.  Diabetes screening. This is done by checking your blood sugar (glucose) after you have not eaten for a while (fasting). You may have this done every 1-3 years.  Abdominal aortic aneurysm (AAA) screening. You may need this if you are a current or former smoker.  Osteoporosis. You may be screened starting at age 81 if you are at high risk. Talk with your health care  provider about your test results, treatment options, and if necessary, the need for more tests. Vaccines  Your health care provider may recommend certain vaccines, such as:  Influenza vaccine. This is recommended every year.  Tetanus,  diphtheria, and acellular pertussis (Tdap, Td) vaccine. You may need a Td booster every 10 years.  Zoster vaccine. You may need this after age 22.  Pneumococcal 13-valent conjugate (PCV13) vaccine. One dose is recommended after age 18.  Pneumococcal polysaccharide (PPSV23) vaccine. One dose is recommended after age 72. Talk to your health care provider about which screenings and vaccines you need and how often you need them. This information is not intended to replace advice given to you by your health care provider. Make sure you discuss any questions you have with your health care provider. Document Released: 06/30/2015 Document Revised: 02/21/2016 Document Reviewed: 04/04/2015 Elsevier Interactive Patient Education  2017 [place] Prevention in the Home Falls can cause injuries. They can happen to people of all ages. There are many things you can do to make your home safe and to help prevent falls. What can I do on the outside of my home?  Regularly fix the edges of walkways and driveways and fix any cracks.  Remove anything that might make you trip as you walk through a door, such as a raised step or threshold.  Trim any bushes or trees on the path to your home.  Use bright outdoor lighting.  Clear any walking paths of anything that might make someone trip, such as rocks or tools.  Regularly check to see if handrails are loose or broken. Make sure that both sides of any steps have handrails.  Any raised decks and porches should have guardrails on the edges.  Have any leaves, snow, or ice cleared regularly.  Use sand or salt on walking paths during winter.  Clean up any spills in your garage right away. This includes oil or grease spills. What can I do in the bathroom?  Use night lights.  Install grab bars by the toilet and in the tub and shower. Do not use towel bars as grab bars.  Use non-skid mats or decals in the tub or shower.  If you need to sit down in  the shower, use a plastic, non-slip stool.  Keep the floor dry. Clean up any water that spills on the floor as soon as it happens.  Remove soap buildup in the tub or shower regularly.  Attach bath mats securely with double-sided non-slip rug tape.  Do not have throw rugs and other things on the floor that can make you trip. What can I do in the bedroom?  Use night lights.  Make sure that you have a light by your bed that is easy to reach.  Do not use any sheets or blankets that are too big for your bed. They should not hang down onto the floor.  Have a firm chair that has side arms. You can use this for support while you get dressed.  Do not have throw rugs and other things on the floor that can make you trip. What can I do in the kitchen?  Clean up any spills right away.  Avoid walking on wet floors.  Keep items that you use a lot in easy-to-reach places.  If you need to reach something above you, use a strong step stool that has a grab bar.  Keep electrical cords out of the way.  Do not use floor polish  or wax that makes floors slippery. If you must use wax, use non-skid floor wax.  Do not have throw rugs and other things on the floor that can make you trip. What can I do with my stairs?  Do not leave any items on the stairs.  Make sure that there are handrails on both sides of the stairs and use them. Fix handrails that are broken or loose. Make sure that handrails are as long as the stairways.  Check any carpeting to make sure that it is firmly attached to the stairs. Fix any carpet that is loose or worn.  Avoid having throw rugs at the top or bottom of the stairs. If you do have throw rugs, attach them to the floor with carpet tape.  Make sure that you have a light switch at the top of the stairs and the bottom of the stairs. If you do not have them, ask someone to add them for you. What else can I do to help prevent falls?  Wear shoes that:  Do not have high  heels.  Have rubber bottoms.  Are comfortable and fit you well.  Are closed at the toe. Do not wear sandals.  If you use a stepladder:  Make sure that it is fully opened. Do not climb a closed stepladder.  Make sure that both sides of the stepladder are locked into place.  Ask someone to hold it for you, if possible.  Clearly mark and make sure that you can see:  Any grab bars or handrails.  First and last steps.  Where the edge of each step is.  Use tools that help you move around (mobility aids) if they are needed. These include:  Canes.  Walkers.  Scooters.  Crutches.  Turn on the lights when you go into a dark area. Replace any light bulbs as soon as they burn out.  Set up your furniture so you have a clear path. Avoid moving your furniture around.  If any of your floors are uneven, fix them.  If there are any pets around you, be aware of where they are.  Review your medicines with your doctor. Some medicines can make you feel dizzy. This can increase your chance of falling. Ask your doctor what other things that you can do to help prevent falls. This information is not intended to replace advice given to you by your health care provider. Make sure you discuss any questions you have with your health care provider. Document Released: 03/30/2009 Document Revised: 11/09/2015 Document Reviewed: 07/08/2014 Elsevier Interactive Patient Education  2017 [employer].

## 2018-06-19 NOTE — Telephone Encounter (Signed)
Pt seen in office today for Medicare Annual Wellness visit. Pt stated his fasting blood sugar this morning was 240 and he was unaware it was okay for him to resume metformin. Pt needs new rx sent to pharmacy. He also needs refills on glimepiride 4mg , pen needles for trulicity and losartan 50mg . Pt also has HCTZ 12.5mg  on his med list and stated he is not taking that and not sure if he is supposed to, if so needs rx.   Please send to [place]. Pt has upcoming appt 07/31/18 with Dr. Sanda Klein for follow up. Thank you!

## 2018-06-19 NOTE — Addendum Note (Signed)
Addended by: LADA, Satira Anis on: 06/19/2018 01:01 PM   Modules accepted: Orders

## 2018-06-19 NOTE — Telephone Encounter (Signed)
Tried to call pt again

## 2018-06-19 NOTE — Telephone Encounter (Addendum)
Trulicity has its own needle attached; no pen needles are needed with that device Message was left for patient by Cathrine Muster, CMA on May 19, 2018 about the metformin

## 2018-06-19 NOTE — Progress Notes (Signed)
Subjective:   Paul Spears is a 73 y.o. male who presents for Medicare Annual/Subsequent preventive examination.  Review of Systems:   Cardiac Risk Factors include: advanced age (>52men, >31 women);diabetes mellitus;dyslipidemia;hypertension;male gender     Objective:    Vitals: BP 118/72 (BP Location: Left Arm, Patient Position: Sitting, Cuff Size: Normal)   Pulse (!) 120   Temp 98 F (36.7 C) (Oral)   Resp 16   Ht 5\' 11"  (1.803 m)   Wt 201 lb 4.8 oz (91.3 kg)   SpO2 98%   BMI 28.08 kg/m   Body mass index is 28.08 kg/m.  Advanced Directives 06/19/2018 04/09/2018 11/06/2017 04/22/2017 01/15/2017 10/22/2016 10/15/2016  Does Patient Have a Medical Advance Directive? No No No No No No No  Would patient like information on creating a medical advance directive? No - Patient declined No - Patient declined No - Patient declined No - Patient declined - - -    Tobacco Social History   Tobacco Use  Smoking Status Never Smoker  Smokeless Tobacco Never Used     Counseling given: Not Answered   Clinical Intake:  Pre-visit preparation completed: Yes  Pain : 0-10 Pain Score: 3  Pain Type: Chronic pain Pain Location: Arm Pain Orientation: Right Pain Descriptors / Indicators: Sore Pain Onset: More than a month ago Pain Frequency: Constant     Nutritional Status: BMI 25 -29 Overweight Nutritional Risks: None Diabetes: Yes CBG done?: No Did pt. bring in CBG monitor from home?: No   Nutrition Risk Assessment:  Has the patient had any N/V/D within the last 2 months?  No  Does the patient have any non-healing wounds?  No  Has the patient had any unintentional weight loss or weight gain?  No   Diabetes:  Is the patient diabetic?  Yes  If diabetic, was a CBG obtained today?  No  Did the patient bring in their glucometer from home?  No  How often do you monitor your CBG's? Daily fasting in am and as needed. .   Financial Strains and Diabetes Management:  Are you having any  financial strains with the device, your supplies or your medication? No .  Does the patient want to be seen by Chronic Care Management for management of their diabetes?  No  Would the patient like to be referred to a Nutritionist or for Diabetic Management?  No   Diabetic Exams:  Diabetic Eye Exam: Completed 12/31/17 negative retinopathy.   Diabetic Foot Exam: Completed 01/28/18.   How often do you need to have someone help you when you read instructions, pamphlets, or other written materials from your doctor or pharmacy?: 1 - Never What is the last grade level you completed in school?: GED  Interpreter Needed?: No  Information entered by :: Clemetine Marker LPN  Past Medical History:  Diagnosis Date  . Allergy   . Diabetes mellitus without complication ([place])   . GERD (gastroesophageal reflux disease)   . Hyperlipidemia   . Hypertension   . Iron deficiency anemia 11/11/2017  . Kidney disease, chronic, stage III (GFR 30-59 ml/min) ([facility])    Past Surgical History:  Procedure Laterality Date  . CARPAL TUNNEL RELEASE Right   . ESOPHAGOGASTRODUODENOSCOPY (EGD) WITH PROPOFOL N/A 10/22/2016   Procedure: ESOPHAGOGASTRODUODENOSCOPY (EGD) WITH PROPOFOL;  Surgeon: Lollie Sails, MD;  Location: [facility] ENDOSCOPY;  Service: Endoscopy;  Laterality: N/A;  . FRACTURE SURGERY Left    arm  . SHOULDER ARTHROSCOPY WITH SUBACROMIAL DECOMPRESSION AND BICEP TENDON  REPAIR Right 04/16/2018   Procedure: ARTHROSCOPIC  BICEP TENOTOMY;  Surgeon: Thornton Park, MD;  Location: [facility] ORS;  Service: Orthopedics;  Laterality: Right;   Family History  Problem Relation Age of Onset  . Hypertension Father   . Heart attack Father   . Heart disease Sister   . Hypertension Sister    Social History   Socioeconomic History  . Marital status: Divorced    Spouse name: Not on file  . Number of children: 3  . Years of education: Not on file  . Highest education level: GED or equivalent  Occupational History  . Not  on file  Social Needs  . Financial resource strain: Not hard at all  . Food insecurity:    Worry: Never true    Inability: Never true  . Transportation needs:    Medical: No    Non-medical: No  Tobacco Use  . Smoking status: Never Smoker  . Smokeless tobacco: Never Used  Substance and Sexual Activity  . Alcohol use: No  . Drug use: No  . Sexual activity: Not Currently  Lifestyle  . Physical activity:    Days per week: 0 days    Minutes per session: 0 min  . Stress: Not at all  Relationships  . Social connections:    Talks on phone: More than three times a week    Gets together: More than three times a week    Attends religious service: More than 4 times per year    Active member of club or organization: Yes    Attends meetings of clubs or organizations: 1 to 4 times per year    Relationship status: Divorced  Other Topics Concern  . Not on file  Social History Narrative  . Not on file    Outpatient Encounter Medications as of 06/19/2018  Medication Sig  . atorvastatin (LIPITOR) 80 MG tablet Take 1 tablet (80 mg total) by mouth daily.  . Blood Glucose Monitoring Suppl (ACCU-CHEK AVIVA) device USE AS DIRECTED  . Cholecalciferol (VITAMIN D3 PO) Take 1 capsule by mouth daily.  Marland Kitchen esomeprazole (NEXIUM) 20 MG capsule Take 20 mg by mouth daily.   Marland Kitchen ezetimibe (ZETIA) 10 MG tablet Take 1 tablet (10 mg total) by mouth daily.  . fluticasone (FLONASE) 50 MCG/ACT nasal spray Place 2 sprays into both nostrils daily as needed. (Patient taking differently: Place 2 sprays into both nostrils daily. )  . glimepiride (AMARYL) 4 MG tablet TAKE 1 TABLET BY MOUTH ONCE DAILY WITH BREAKFAST  . glucose blood (ACCU-CHEK AVIVA PLUS) test strip Check fingerstick blood sugar once a day; LON 99 months, Dx E11.22  . losartan (COZAAR) 50 MG tablet Take 50 mg by mouth daily.  . TRULICITY 1.60 FU/9.3AT SOPN INJECT 1 SYRINGEFUL INTO THE SKIN ONCE WEEKLY FOR DIABETES. STOP JANUVIA.  . hydrochlorothiazide  (HYDRODIURIL) 12.5 MG tablet Take 12.5 mg by mouth daily.   . metFORMIN (GLUCOPHAGE) 1000 MG tablet TAKE 1 TABLET BY MOUTH TWICE DAILY WITH MEALS (Patient not taking: Reported on 06/19/2018)   No facility-administered encounter medications on file as of 06/19/2018.     Activities of Daily Living In your present state of health, do you have any difficulty performing the following activities: 06/19/2018 04/30/2018  Hearing? Y N  Comment difficulty hearing, hearing aids from [place]? N N  Difficulty concentrating or making decisions? N N  Walking or climbing stairs? N N  Dressing or bathing? N N  Doing errands, shopping? N N  Preparing Food and eating ? N -  Using the Toilet? N -  In the past six months, have you accidently leaked urine? N -  Do you have problems with loss of bowel control? N -  Managing your Medications? N -  Managing your Finances? N -  Housekeeping or managing your Housekeeping? N -  Some recent data might be hidden    Patient Care Team: Lada, Satira Anis, MD as PCP - General (Family Medicine) Lavonia Dana, MD as Consulting Physician (Internal Medicine) Lollie Sails, MD as Consulting Physician (Gastroenterology)   Assessment:   This is a routine wellness examination for [facility].  Exercise Activities and Dietary recommendations Current Exercise Habits: The patient does not participate in regular exercise at present, Exercise limited by: orthopedic condition(s)(right shoulder)  Goals    . Have 3 meals a day     Recommend to eat 3 small healthy meals and 2 healthy snacks per day    . Increase physical activity     Increase physical activity to 150 minutes per week       Fall Risk Fall Risk  06/19/2018 04/30/2018 01/28/2018 11/21/2017 10/28/2017  Falls in the past year? 1 1 No No Yes  Number falls in past yr: 1 0 - - 1  Injury with Fall? 0 0 - - No  Risk for fall due to : History of fall(s) - - - -  Follow up Falls evaluation completed;Falls prevention  discussed - - - -   FALL RISK PREVENTION PERTAINING TO THE HOME:  Any stairs in or around the home WITH handrails? No  Home free of loose throw rugs in walkways, pet beds, electrical cords, etc? Yes  Adequate lighting in your home to reduce risk of falls? Yes   ASSISTIVE DEVICES UTILIZED TO PREVENT FALLS:  Life alert? No  Use of a cane, walker or w/c? No  Grab bars in the bathroom? No  Shower chair or bench in shower? No  Elevated toilet seat or a handicapped toilet? No   DME ORDERS:  DME order needed?  No   TIMED UP AND GO:  Was the test performed? Yes .  Length of time to ambulate 10 feet: 5 sec.   GAIT:  Appearance of gait: Gait stead-fast and without the use of an assistive device.  Education: Fall risk prevention has been discussed.  Intervention(s) required? No   Depression Screen PHQ 2/9 Scores 06/19/2018 01/28/2018 10/28/2017 07/24/2017  PHQ - 2 Score 2 0 0 0  PHQ- 9 Score 4 - - -    Cognitive Function     6CIT Screen 06/19/2018 04/22/2017  What Year? 0 points 0 points  What month? 0 points 0 points  What time? 0 points 0 points  Count back from 20 0 points 0 points  Months in reverse 2 points 2 points  Repeat phrase 0 points 4 points  Total Score 2 6    Immunization History  Administered Date(s) Administered  . Influenza, High Dose Seasonal PF 03/06/2018  . Influenza,inj,Quad PF,6+ Mos 02/18/2013  . Influenza-Unspecified 02/18/2013, 03/18/2015, 04/01/2017  . Pneumococcal Conjugate-13 04/08/2016, 03/06/2018  . Pneumococcal Polysaccharide-23 08/03/2012  . Tdap 04/03/2016    Qualifies for Shingles Vaccine? Yes . Due for Shingrix. Education has been provided regarding the importance of this vaccine. Pt has been advised to call insurance company to determine out of pocket expense. Advised may also receive vaccine at local pharmacy or Health Dept. Verbalized acceptance and understanding.  Tdap: Up to  date   Flu Vaccine: Up to date   Pneumococcal Vaccine:  Up to date   Screening Tests Health Maintenance  Topic Date Due  . COLONOSCOPY  10/23/2018  . HEMOGLOBIN A1C  10/29/2018  . OPHTHALMOLOGY EXAM  01/01/2019  . FOOT EXAM  01/29/2019  . TETANUS/TDAP  04/03/2026  . INFLUENZA VACCINE  Completed  . Hepatitis C Screening  Completed  . PNA vac Low Risk Adult  Completed   Cancer Screenings:  Colorectal Screening: Completed 10/22/16. Repeat every 2 years; Pt has already been referred to GI and has upcoming appt.   Lung Cancer Screening: (Low Dose CT Chest recommended if Age 32-80 years, 30 pack-year currently smoking OR have quit w/in 15years.) does not qualify.    Additional Screening:  Hepatitis C Screening: does qualify; Completed 04/22/17  Vision Screening: Recommended annual ophthalmology exams for early detection of glaucoma and other disorders of the eye. Is the patient up to date with their annual eye exam?  Yes  Who is the provider or what is the name of the office in which the pt attends annual eye exams? [place] Screening: Recommended annual dental exams for proper oral hygiene  Community Resource Referral:  CRR required this visit?  No      Plan:    I have personally reviewed and addressed the Medicare Annual Wellness questionnaire and have noted the following in the patient's chart:  A. Medical and social history B. Use of alcohol, tobacco or illicit drugs  C. Current medications and supplements D. Functional ability and status E.  Nutritional status F.  Physical activity G. Advance directives H. List of other physicians I.  Hospitalizations, surgeries, and ER visits in previous 12 months J.  [place] such as hearing and vision if needed, cognitive and depression L. Referrals and appointments   In addition, I have reviewed and discussed with patient certain preventive protocols, quality metrics, and best practice recommendations. A written personalized care plan for preventive  services as well as general preventive health recommendations were provided to patient.   Signed,  Clemetine Marker, LPN Nurse Health Advisor   Nurse Notes: Pt stated he checked fasting blood sugar this morning and it was 240. He was unaware that he was able to start metformin again and needs a new rx (refill request encounter sent) He is also not currently only HCTZ 12.5mg  and wants to know if he needs to be taking that. He is still recovering from shoulder and bicep surgery and continuing physical therapy. He plans to resume exercise once feeling better.

## 2018-06-19 NOTE — Telephone Encounter (Signed)
It looks like the combination losartan-hctz was switched at some point, perhaps by his kidney doctor(??) Please ask him to contact his kidney doctor to see if he is prescribing his losartan and/or HCTZ

## 2018-06-19 NOTE — Addendum Note (Signed)
Addended by: Docia Furl on: 06/19/2018 09:50 AM   Modules accepted: Orders

## 2018-06-19 NOTE — Telephone Encounter (Signed)
Patient's phone is not in service

## 2018-06-22 NOTE — Telephone Encounter (Signed)
Pt.notified

## 2018-07-07 ENCOUNTER — Ambulatory Visit: Payer: Medicare Other | Admitting: Gastroenterology

## 2018-07-07 ENCOUNTER — Other Ambulatory Visit: Payer: Self-pay

## 2018-07-07 ENCOUNTER — Encounter: Payer: Self-pay | Admitting: Gastroenterology

## 2018-07-07 VITALS — BP 156/81 | HR 132 | Ht 71.0 in | Wt 201.4 lb

## 2018-07-07 DIAGNOSIS — K2270 Barrett's esophagus without dysplasia: Secondary | ICD-10-CM | POA: Diagnosis not present

## 2018-07-07 DIAGNOSIS — R748 Abnormal levels of other serum enzymes: Secondary | ICD-10-CM | POA: Diagnosis not present

## 2018-07-07 DIAGNOSIS — K22719 Barrett's esophagus with dysplasia, unspecified: Secondary | ICD-10-CM

## 2018-07-07 NOTE — Progress Notes (Signed)
Gastroenterology Consultation  Referring Provider:     Arnetha Courser, MD Primary Care Physician:  Arnetha Courser, MD Primary Gastroenterologist:  Dr. Allen Norris     Reason for Consultation:     Elevated alkaline phosphatase        HPI:   Paul Spears is a 73 y.o. y/o male referred for consultation & management of elevated alkaline phosphatase by Dr. Sanda Klein, Satira Anis, MD.  This patient comes in today with a history of Barrett's esophagus.  The patient's last EGD was back in 2018 by Dr. Gustavo Lah.  At that time the patient had biopsies of the GE junction which showed intestinal metaplasia with goblet cells consistent with Barrett's esophagus.  The patient was noted to have abnormal alkaline phosphatase in February of this year.  The alkaline phosphatase levels have ranged between 120 and 160.  The alkaline phosphatase had also been noted to be elevated back in 2016.  The patient's other liver enzymes have all been normal during this time.  It does not appear that the alkaline phosphatase has been fractionated in the past and I do not see a GGT in previous lab test.  The patient also reports that he is due for a repeat EGD due to his Barrett's esophagus.  As far as his colonoscopy is concerned he states that he is not due for that the next 2 years. The patient does report some weight loss but he states that this has been because he got divorced and he had a right shoulder injury and found it hard to be able to eat with his left hand.  Past Medical History:  Diagnosis Date  . Allergy   . Diabetes mellitus without complication ([place])   . GERD (gastroesophageal reflux disease)   . Hyperlipidemia   . Hypertension   . Iron deficiency anemia 11/11/2017  . Kidney disease, chronic, stage III (GFR 30-59 ml/min) (HCC)     Past Surgical History:  Procedure Laterality Date  . CARPAL TUNNEL RELEASE Right   . ESOPHAGOGASTRODUODENOSCOPY (EGD) WITH PROPOFOL N/A 10/22/2016   Procedure:  ESOPHAGOGASTRODUODENOSCOPY (EGD) WITH PROPOFOL;  Surgeon: Lollie Sails, MD;  Location: [facility] ENDOSCOPY;  Service: Endoscopy;  Laterality: N/A;  . FRACTURE SURGERY Left    arm  . SHOULDER ARTHROSCOPY WITH SUBACROMIAL DECOMPRESSION AND BICEP TENDON REPAIR Right 04/16/2018   Procedure: ARTHROSCOPIC  BICEP TENOTOMY;  Surgeon: Thornton Park, MD;  Location: [facility] ORS;  Service: Orthopedics;  Laterality: Right;    Prior to Admission medications   Medication Sig Start Date End Date Taking? Authorizing Provider  atorvastatin (LIPITOR) 80 MG tablet Take 1 tablet (80 mg total) by mouth daily. 01/13/18   Arnetha Courser, MD  Blood Glucose Monitoring Suppl (ACCU-CHEK AVIVA) device USE AS DIRECTED 01/09/16   Roselee Nova, MD  Cholecalciferol (VITAMIN D3 PO) Take 1 capsule by mouth daily.    [provider]  Dulaglutide (TRULICITY) 4.00 QQ/7.6PP SOPN Inject 1 Syringe into the skin once a week. 06/19/18   Arnetha Courser, MD  esomeprazole (NEXIUM) 20 MG capsule Take 20 mg by mouth daily.  05/22/14   Roselee Nova, MD  ezetimibe (ZETIA) 10 MG tablet Take 1 tablet (10 mg total) by mouth daily. 01/13/18   Arnetha Courser, MD  fluticasone (FLONASE) 50 MCG/ACT nasal spray Place 2 sprays into both nostrils daily as needed. Patient taking differently: Place 2 sprays into both nostrils daily.  10/28/17   Arnetha Courser, MD  glimepiride (AMARYL) 4 MG tablet Take 1 tablet (4 mg total) by mouth daily with breakfast. 06/19/18   Lada, Satira Anis, MD  glucose blood (ACCU-CHEK AVIVA PLUS) test strip Check fingerstick blood sugar once a day; LON 99 months, Dx E11.22 06/01/18   Arnetha Courser, MD  hydrochlorothiazide (HYDRODIURIL) 12.5 MG tablet Take 12.5 mg by mouth daily.     [provider]  losartan (COZAAR) 50 MG tablet Take 50 mg by mouth daily.    [provider]  metFORMIN (GLUCOPHAGE) 1000 MG tablet Take 1 tablet (1,000 mg total) by mouth 2 (two) times daily with a meal. 06/19/18    Lada, Satira Anis, MD    Family History  Problem Relation Age of Onset  . Hypertension Father   . Heart attack Father   . Heart disease Sister   . Hypertension Sister      Social History   Tobacco Use  . Smoking status: Never Smoker  . Smokeless tobacco: Never Used  Substance Use Topics  . Alcohol use: No  . Drug use: No    Allergies as of 07/07/2018  . (No Known Allergies)    Review of Systems:    All systems reviewed and negative except where noted in HPI.   Physical Exam:  There were no vitals taken for this visit. No LMP for male patient. General:   Alert,  Well-developed, well-nourished, pleasant and cooperative in NAD Head:  Normocephalic and atraumatic. Eyes:  Sclera clear, no icterus.   Conjunctiva pink. Ears:  Normal auditory acuity. Nose:  No deformity, discharge, or lesions. Mouth:  No deformity or lesions,oropharynx pink & moist. Neck:  Supple; no masses or thyromegaly. Lungs:  Respirations even and unlabored.  Clear throughout to auscultation.   No wheezes, crackles, or rhonchi. No acute distress. Heart:  Regular rate and rhythm; no murmurs, clicks, rubs, or gallops. Abdomen:  Normal bowel sounds.  No bruits.  Soft, non-tender and non-distended without masses, hepatosplenomegaly or hernias noted.  No guarding or rebound tenderness.  Negative Carnett sign.   Rectal:  Deferred.  Msk:  Symmetrical without gross deformities.  Good, equal movement & strength bilaterally. Pulses:  Normal pulses noted. Extremities:  No clubbing or edema.  No cyanosis. Neurologic:  Alert and oriented x3;  grossly normal neurologically. Skin:  Intact without significant lesions or rashes.  No jaundice. Lymph Nodes:  No significant cervical adenopathy. Psych:  Alert and cooperative. Normal mood and affect.  Imaging Studies: No results found.  Assessment and Plan:   Paul Spears is a 73 y.o. y/o male who comes in with a history of Barrett's esophagus and elevated alkaline  phosphatase.  The patient will be set up for an EGD due to his Barrett's esophagus and he states he is not due for another colonoscopy for 2 years.  The patient will also be set up for blood work with fractionation of the alkaline phosphatase and a GGT.  This is to confirm that the alkaline phosphatase increases from a liver source.  The patient had an ultrasound that did not show any abnormalities last year.  If the alkaline phosphatase remains high it may be due to medications.  He does report that he was taking Tylenol for his shoulder but it did not do much for the pain so he stopped taking it so this is unlikely the cause of his abnormal alkaline phosphatase.  The patient has been explained the plan and agrees with it.  Lucilla Lame, MD. Marval Regal  Note: This dictation was prepared with Dragon dictation along with smaller phrase technology. Any transcriptional errors that result from this process are unintentional.

## 2018-07-07 NOTE — H&P (View-Only) (Signed)
Gastroenterology Consultation  Referring Provider:     Arnetha Courser, MD Primary Care Physician:  Arnetha Courser, MD Primary Gastroenterologist:  Dr. Allen Norris     Reason for Consultation:     Elevated alkaline phosphatase        HPI:   Paul Spears is a 73 y.o. y/o male referred for consultation & management of elevated alkaline phosphatase by Dr. Sanda Klein, Satira Anis, MD.  This patient comes in today with a history of Barrett's esophagus.  The patient's last EGD was back in 2018 by Dr. Gustavo Lah.  At that time the patient had biopsies of the GE junction which showed intestinal metaplasia with goblet cells consistent with Barrett's esophagus.  The patient was noted to have abnormal alkaline phosphatase in February of this year.  The alkaline phosphatase levels have ranged between 120 and 160.  The alkaline phosphatase had also been noted to be elevated back in 2016.  The patient's other liver enzymes have all been normal during this time.  It does not appear that the alkaline phosphatase has been fractionated in the past and I do not see a GGT in previous lab test.  The patient also reports that he is due for a repeat EGD due to his Barrett's esophagus.  As far as his colonoscopy is concerned he states that he is not due for that the next 2 years. The patient does report some weight loss but he states that this has been because he got divorced and he had a right shoulder injury and found it hard to be able to eat with his left hand.  Past Medical History:  Diagnosis Date  . Allergy   . Diabetes mellitus without complication ([place])   . GERD (gastroesophageal reflux disease)   . Hyperlipidemia   . Hypertension   . Iron deficiency anemia 11/11/2017  . Kidney disease, chronic, stage III (GFR 30-59 ml/min) (HCC)     Past Surgical History:  Procedure Laterality Date  . CARPAL TUNNEL RELEASE Right   . ESOPHAGOGASTRODUODENOSCOPY (EGD) WITH PROPOFOL N/A 10/22/2016   Procedure:  ESOPHAGOGASTRODUODENOSCOPY (EGD) WITH PROPOFOL;  Surgeon: Lollie Sails, MD;  Location: [facility] ENDOSCOPY;  Service: Endoscopy;  Laterality: N/A;  . FRACTURE SURGERY Left    arm  . SHOULDER ARTHROSCOPY WITH SUBACROMIAL DECOMPRESSION AND BICEP TENDON REPAIR Right 04/16/2018   Procedure: ARTHROSCOPIC  BICEP TENOTOMY;  Surgeon: Thornton Park, MD;  Location: [facility] ORS;  Service: Orthopedics;  Laterality: Right;    Prior to Admission medications   Medication Sig Start Date End Date Taking? Authorizing Provider  atorvastatin (LIPITOR) 80 MG tablet Take 1 tablet (80 mg total) by mouth daily. 01/13/18   Arnetha Courser, MD  Blood Glucose Monitoring Suppl (ACCU-CHEK AVIVA) device USE AS DIRECTED 01/09/16   Roselee Nova, MD  Cholecalciferol (VITAMIN D3 PO) Take 1 capsule by mouth daily.    [provider]  Dulaglutide (TRULICITY) 5.42 HC/6.2BJ SOPN Inject 1 Syringe into the skin once a week. 06/19/18   Arnetha Courser, MD  esomeprazole (NEXIUM) 20 MG capsule Take 20 mg by mouth daily.  05/22/14   Roselee Nova, MD  ezetimibe (ZETIA) 10 MG tablet Take 1 tablet (10 mg total) by mouth daily. 01/13/18   Arnetha Courser, MD  fluticasone (FLONASE) 50 MCG/ACT nasal spray Place 2 sprays into both nostrils daily as needed. Patient taking differently: Place 2 sprays into both nostrils daily.  10/28/17   Arnetha Courser, MD  glimepiride (AMARYL) 4 MG tablet Take 1 tablet (4 mg total) by mouth daily with breakfast. 06/19/18   Lada, Satira Anis, MD  glucose blood (ACCU-CHEK AVIVA PLUS) test strip Check fingerstick blood sugar once a day; LON 99 months, Dx E11.22 06/01/18   Arnetha Courser, MD  hydrochlorothiazide (HYDRODIURIL) 12.5 MG tablet Take 12.5 mg by mouth daily.     [provider]  losartan (COZAAR) 50 MG tablet Take 50 mg by mouth daily.    [provider]  metFORMIN (GLUCOPHAGE) 1000 MG tablet Take 1 tablet (1,000 mg total) by mouth 2 (two) times daily with a meal. 06/19/18    Lada, Satira Anis, MD    Family History  Problem Relation Age of Onset  . Hypertension Father   . Heart attack Father   . Heart disease Sister   . Hypertension Sister      Social History   Tobacco Use  . Smoking status: Never Smoker  . Smokeless tobacco: Never Used  Substance Use Topics  . Alcohol use: No  . Drug use: No    Allergies as of 07/07/2018  . (No Known Allergies)    Review of Systems:    All systems reviewed and negative except where noted in HPI.   Physical Exam:  There were no vitals taken for this visit. No LMP for male patient. General:   Alert,  Well-developed, well-nourished, pleasant and cooperative in NAD Head:  Normocephalic and atraumatic. Eyes:  Sclera clear, no icterus.   Conjunctiva pink. Ears:  Normal auditory acuity. Nose:  No deformity, discharge, or lesions. Mouth:  No deformity or lesions,oropharynx pink & moist. Neck:  Supple; no masses or thyromegaly. Lungs:  Respirations even and unlabored.  Clear throughout to auscultation.   No wheezes, crackles, or rhonchi. No acute distress. Heart:  Regular rate and rhythm; no murmurs, clicks, rubs, or gallops. Abdomen:  Normal bowel sounds.  No bruits.  Soft, non-tender and non-distended without masses, hepatosplenomegaly or hernias noted.  No guarding or rebound tenderness.  Negative Carnett sign.   Rectal:  Deferred.  Msk:  Symmetrical without gross deformities.  Good, equal movement & strength bilaterally. Pulses:  Normal pulses noted. Extremities:  No clubbing or edema.  No cyanosis. Neurologic:  Alert and oriented x3;  grossly normal neurologically. Skin:  Intact without significant lesions or rashes.  No jaundice. Lymph Nodes:  No significant cervical adenopathy. Psych:  Alert and cooperative. Normal mood and affect.  Imaging Studies: No results found.  Assessment and Plan:   Paul Spears is a 73 y.o. y/o male who comes in with a history of Barrett's esophagus and elevated alkaline  phosphatase.  The patient will be set up for an EGD due to his Barrett's esophagus and he states he is not due for another colonoscopy for 2 years.  The patient will also be set up for blood work with fractionation of the alkaline phosphatase and a GGT.  This is to confirm that the alkaline phosphatase increases from a liver source.  The patient had an ultrasound that did not show any abnormalities last year.  If the alkaline phosphatase remains high it may be due to medications.  He does report that he was taking Tylenol for his shoulder but it did not do much for the pain so he stopped taking it so this is unlikely the cause of his abnormal alkaline phosphatase.  The patient has been explained the plan and agrees with it.  Lucilla Lame, MD. Marval Regal  Note: This dictation was prepared with Dragon dictation along with smaller phrase technology. Any transcriptional errors that result from this process are unintentional.

## 2018-07-09 LAB — ALKALINE PHOSPHATASE, ISOENZYMES
Alkaline Phosphatase: 134 IU/L — ABNORMAL HIGH (ref 39–117)
BONE FRACTION: 18 % (ref 12–68)
INTESTINAL FRAC.: 5 % (ref 0–18)
LIVER FRACTION: 77 % (ref 13–88)

## 2018-07-09 LAB — GAMMA GT: GGT: 29 IU/L (ref 0–65)

## 2018-07-14 ENCOUNTER — Ambulatory Visit: Payer: Medicare Other | Admitting: Certified Registered"

## 2018-07-14 ENCOUNTER — Encounter
Admission: RE | Disposition: A | Discharge status: Home / Self Care | Payer: Self-pay | Source: Home / Self Care | Attending: Gastroenterology

## 2018-07-14 ENCOUNTER — Ambulatory Visit
Admission: RE | Admit: 2018-07-14 | Discharge: 2018-07-14 | Disposition: A | Discharge status: Home / Self Care | Payer: Medicare Other | Attending: Gastroenterology | Admitting: Gastroenterology

## 2018-07-14 ENCOUNTER — Encounter: Payer: Self-pay | Admitting: *Deleted

## 2018-07-14 DIAGNOSIS — K22719 Barrett's esophagus with dysplasia, unspecified: Secondary | ICD-10-CM

## 2018-07-14 DIAGNOSIS — K219 Gastro-esophageal reflux disease without esophagitis: Secondary | ICD-10-CM | POA: Diagnosis not present

## 2018-07-14 DIAGNOSIS — E785 Hyperlipidemia, unspecified: Secondary | ICD-10-CM | POA: Insufficient documentation

## 2018-07-14 DIAGNOSIS — Z09 Encounter for follow-up examination after completed treatment for conditions other than malignant neoplasm: Secondary | ICD-10-CM | POA: Insufficient documentation

## 2018-07-14 DIAGNOSIS — Z79899 Other long term (current) drug therapy: Secondary | ICD-10-CM | POA: Insufficient documentation

## 2018-07-14 DIAGNOSIS — N183 Chronic kidney disease, stage 3 (moderate): Secondary | ICD-10-CM | POA: Diagnosis not present

## 2018-07-14 DIAGNOSIS — E1122 Type 2 diabetes mellitus with diabetic chronic kidney disease: Secondary | ICD-10-CM | POA: Diagnosis not present

## 2018-07-14 DIAGNOSIS — Z7984 Long term (current) use of oral hypoglycemic drugs: Secondary | ICD-10-CM | POA: Diagnosis not present

## 2018-07-14 DIAGNOSIS — K228 Other specified diseases of esophagus: Secondary | ICD-10-CM | POA: Diagnosis not present

## 2018-07-14 DIAGNOSIS — Z8719 Personal history of other diseases of the digestive system: Secondary | ICD-10-CM | POA: Diagnosis not present

## 2018-07-14 DIAGNOSIS — R748 Abnormal levels of other serum enzymes: Secondary | ICD-10-CM | POA: Insufficient documentation

## 2018-07-14 DIAGNOSIS — I129 Hypertensive chronic kidney disease with stage 1 through stage 4 chronic kidney disease, or unspecified chronic kidney disease: Secondary | ICD-10-CM | POA: Insufficient documentation

## 2018-07-14 DIAGNOSIS — Z7951 Long term (current) use of inhaled steroids: Secondary | ICD-10-CM | POA: Diagnosis not present

## 2018-07-14 DIAGNOSIS — K2289 Other specified disease of esophagus: Secondary | ICD-10-CM

## 2018-07-14 HISTORY — PX: ESOPHAGOGASTRODUODENOSCOPY (EGD) WITH PROPOFOL: SHX5813

## 2018-07-14 LAB — GLUCOSE, CAPILLARY: Glucose-Capillary: 119 mg/dL — ABNORMAL HIGH (ref 70–99)

## 2018-07-14 SURGERY — ESOPHAGOGASTRODUODENOSCOPY (EGD) WITH PROPOFOL
Anesthesia: General

## 2018-07-14 MED ORDER — PROPOFOL 10 MG/ML IV BOLUS
INTRAVENOUS | Status: DC | PRN
  Administered 2018-07-14: 60 mg via INTRAVENOUS

## 2018-07-14 MED ORDER — SODIUM CHLORIDE 0.9 % IV SOLN
INTRAVENOUS | Status: DC
  Administered 2018-07-14: 1000 mL via INTRAVENOUS

## 2018-07-14 MED ORDER — PROPOFOL 500 MG/50ML IV EMUL
INTRAVENOUS | Status: DC | PRN
  Administered 2018-07-14: 185 ug/kg/min via INTRAVENOUS

## 2018-07-14 MED ORDER — LIDOCAINE HCL (CARDIAC) PF 100 MG/5ML IV SOSY
PREFILLED_SYRINGE | INTRAVENOUS | Status: DC | PRN
  Administered 2018-07-14: 50 mg via INTRAVENOUS

## 2018-07-14 MED ORDER — PROPOFOL 10 MG/ML IV BOLUS
INTRAVENOUS | Status: AC
  Filled 2018-07-14: qty 40

## 2018-07-14 NOTE — Anesthesia Post-op Follow-up Note (Signed)
Anesthesia QCDR form completed.        

## 2018-07-14 NOTE — Anesthesia Postprocedure Evaluation (Signed)
Anesthesia Post Note  Patient: Paul Spears  Procedure(s) Performed: ESOPHAGOGASTRODUODENOSCOPY (EGD) WITH PROPOFOL (N/A )  Patient location during evaluation: Endoscopy Anesthesia Type: General Level of consciousness: awake and alert Pain management: pain level controlled Vital Signs Assessment: post-procedure vital signs reviewed and stable Respiratory status: spontaneous breathing, nonlabored ventilation, respiratory function stable and patient connected to nasal cannula oxygen Cardiovascular status: blood pressure returned to baseline and stable Postop Assessment: no apparent nausea or vomiting Anesthetic complications: no     Last Vitals:  Vitals:   07/14/18 1051 07/14/18 1119  BP: 112/75 (!) 141/88  Pulse:    Resp:    Temp: (!) 36.1 C   SpO2:      Last Pain:  Vitals:   07/14/18 1134  TempSrc:   PainSc: 0-No pain                 Martha Clan

## 2018-07-14 NOTE — Transfer of Care (Signed)
Immediate Anesthesia Transfer of Care Note  Patient: Paul Spears  Procedure(s) Performed: ESOPHAGOGASTRODUODENOSCOPY (EGD) WITH PROPOFOL (N/A )  Patient Location: Endoscopy Unit  Anesthesia Type:General  Level of Consciousness: awake, alert  and oriented  Airway & Oxygen Therapy: Patient Spontanous Breathing and Patient connected to nasal cannula oxygen  Post-op Assessment: Report given to RN and Post -op Vital signs reviewed and stable  Post vital signs: Reviewed and stable  Last Vitals:  Vitals Value Taken Time  BP    Temp    Pulse    Resp    SpO2      Last Pain:  Vitals:   07/14/18 0957  TempSrc: Tympanic  PainSc: 0-No pain         Complications: No apparent anesthesia complications

## 2018-07-14 NOTE — Op Note (Signed)
[facility] Gastroenterology Patient Name: Paul Spears Procedure Date: 07/14/2018 10:40 AM MRN: [id number] Account #: [id number] Date of Birth: 1946-05-07 Admit Type: Outpatient Age: 73 Room: [facility] ENDO ROOM 4 Gender: Male Note Status: Finalized Procedure:            Upper GI endoscopy Indications:          Follow-up of Barrett's esophagus Providers:            Lucilla Lame MD, MD Referring MD:         Arnetha Courser (Referring MD) Medicines:            Propofol per Anesthesia Complications:        No immediate complications. Procedure:            Pre-Anesthesia Assessment:                       - Prior to the procedure, a History and Physical was                        performed, and patient medications and allergies were                        reviewed. The patient's tolerance of previous                        anesthesia was also reviewed. The risks and benefits of                        the procedure and the sedation options and risks were                        discussed with the patient. All questions were                        answered, and informed consent was obtained. Prior                        Anticoagulants: The patient has taken no previous                        anticoagulant or antiplatelet agents. ASA Grade                        Assessment: II - A patient with mild systemic disease.                        After reviewing the risks and benefits, the patient was                        deemed in satisfactory condition to undergo the                        procedure.                       After obtaining informed consent, the endoscope was                        passed under direct vision. Throughout the procedure,  the patient's blood pressure, pulse, and oxygen                        saturations were monitored continuously. The Endoscope                        was introduced through the mouth, and advanced to the               second part of duodenum. The upper GI endoscopy was                        accomplished without difficulty. The patient tolerated                        the procedure well. Findings:      The Z-line was irregular and was found at the gastroesophageal junction.       Biopsies were taken with a cold forceps for histology.      The stomach was normal.      The examined duodenum was normal. Impression:           - Z-line irregular, at the gastroesophageal junction.                        Biopsied.                       - Normal stomach.                       - Normal examined duodenum. Recommendation:       - Discharge patient to home.                       - Resume previous diet.                       - Continue present medications.                       - Await pathology results. Procedure Code(s):    --- Professional ---                       [id number], Esophagogastroduodenoscopy, flexible, transoral;                        with biopsy, single or multiple Diagnosis Code(s):    --- Professional ---                       K22.70, Barrett's esophagus without dysplasia                       K22.8, Other specified diseases of esophagus CPT copyright 2018 American Medical Association. All rights reserved. The codes documented in this report are preliminary and upon coder review may  be revised to meet current compliance requirements. Lucilla Lame MD, MD 07/14/2018 10:48:56 AM This report has been signed electronically. Number of Addenda: 0 Note Initiated On: 07/14/2018 10:40 AM      [facility]

## 2018-07-14 NOTE — Interval H&P Note (Signed)
History and Physical Interval Note:  07/14/2018 10:34 AM  Paul Spears  has presented today for surgery, with the diagnosis of Barrett's esophagus K22.70  The various methods of treatment have been discussed with the patient and family. After consideration of risks, benefits and other options for treatment, the patient has consented to  Procedure(s): ESOPHAGOGASTRODUODENOSCOPY (EGD) WITH PROPOFOL (N/A) as a surgical intervention .  The patient's history has been reviewed, patient examined, no change in status, stable for surgery.  I have reviewed the patient's chart and labs.  Questions were answered to the patient's satisfaction.     Alastair Hennes [employer]

## 2018-07-14 NOTE — Anesthesia Preprocedure Evaluation (Signed)
Anesthesia Evaluation  Patient identified by MRN, date of birth, ID band Patient awake    Reviewed: Allergy & Precautions, NPO status , Patient's Chart, lab work & pertinent test results  History of Anesthesia Complications Negative for: history of anesthetic complications  Airway Mallampati: III  TM Distance: >3 FB Neck ROM: Full    Dental no notable dental hx.    Pulmonary neg pulmonary ROS, neg sleep apnea, neg COPD,    breath sounds clear to auscultation- rhonchi (-) wheezing      Cardiovascular hypertension, Pt. on medications (-) CAD, (-) Past MI, (-) Cardiac Stents and (-) CABG  Rhythm:Regular Rate:Normal - Systolic murmurs and - Diastolic murmurs    Neuro/Psych neg Seizures negative neurological ROS  negative psych ROS   GI/Hepatic Neg liver ROS, GERD  ,  Endo/Other  diabetes, Oral Hypoglycemic Agents  Renal/GU Renal InsufficiencyRenal disease     Musculoskeletal  (+) Arthritis ,   Abdominal (+) - obese,   Peds  Hematology  (+) anemia ,   Anesthesia Other Findings Past Medical History: No date: Allergy No date: Diabetes mellitus without complication (HCC) No date: GERD (gastroesophageal reflux disease) No date: Hyperlipidemia No date: Hypertension 11/11/2017: Iron deficiency anemia No date: Kidney disease, chronic, stage III (GFR 30-59 ml/min) (HCC)   Reproductive/Obstetrics                             Anesthesia Physical Anesthesia Plan  ASA: II  Anesthesia Plan: General   Post-op Pain Management:    Induction: Intravenous  PONV Risk Score and Plan: 1 and Propofol infusion  Airway Management Planned: Natural Airway  Additional Equipment:   Intra-op Plan:   Post-operative Plan:   Informed Consent: I have reviewed the patients History and Physical, chart, labs and discussed the procedure including the risks, benefits and alternatives for the proposed anesthesia  with the patient or authorized representative who has indicated his/her understanding and acceptance.     Dental advisory given  Plan Discussed with: CRNA and Anesthesiologist  Anesthesia Plan Comments:         Anesthesia Quick Evaluation

## 2018-07-15 ENCOUNTER — Encounter: Payer: Self-pay | Admitting: Gastroenterology

## 2018-07-16 LAB — SURGICAL PATHOLOGY

## 2018-07-28 ENCOUNTER — Telehealth: Payer: Self-pay

## 2018-07-28 NOTE — Telephone Encounter (Signed)
-----   Message from Lucilla Lame, MD sent at 07/20/2018  8:10 AM EST ----- That the patient know that his biopsies did not show any sign of Barrett's esophagus. He should have a repeat EGD in 3 years and if that is negative no further follow-up is needed.

## 2018-07-28 NOTE — Telephone Encounter (Signed)
Unable to reach pt due to phone not working. Contacted pt's son, Corene Cornea and advised him of the results of the EGD.

## 2018-07-31 ENCOUNTER — Telehealth: Payer: Self-pay | Admitting: Family Medicine

## 2018-07-31 ENCOUNTER — Ambulatory Visit: Payer: Medicare Other | Admitting: Family Medicine

## 2018-07-31 NOTE — Telephone Encounter (Signed)
Unable to leave voicemail.

## 2018-07-31 NOTE — Telephone Encounter (Signed)
Pt had appt for today and it was cancelled due to Dr Sanda Klein being out of office. He states that he is out of his meds and is asking for enough to last until next Friday. Please send in Trulicity

## 2018-07-31 NOTE — Telephone Encounter (Signed)
Please see the Trulicity prescription I wrote in January He should not need more from me Ask him to contact his pharmacy

## 2018-08-07 ENCOUNTER — Ambulatory Visit: Payer: Medicare Other | Admitting: Family Medicine

## 2018-08-21 ENCOUNTER — Encounter: Payer: Self-pay | Admitting: Family Medicine

## 2018-08-21 ENCOUNTER — Ambulatory Visit (INDEPENDENT_AMBULATORY_CARE_PROVIDER_SITE_OTHER): Payer: Medicare Other | Admitting: Family Medicine

## 2018-08-21 VITALS — BP 122/80 | HR 99 | Temp 98.0°F | Ht 71.0 in | Wt 200.6 lb

## 2018-08-21 DIAGNOSIS — IMO0002 Reserved for concepts with insufficient information to code with codable children: Secondary | ICD-10-CM

## 2018-08-21 DIAGNOSIS — R809 Proteinuria, unspecified: Secondary | ICD-10-CM

## 2018-08-21 DIAGNOSIS — K22719 Barrett's esophagus with dysplasia, unspecified: Secondary | ICD-10-CM

## 2018-08-21 DIAGNOSIS — E1122 Type 2 diabetes mellitus with diabetic chronic kidney disease: Secondary | ICD-10-CM

## 2018-08-21 DIAGNOSIS — N183 Chronic kidney disease, stage 3 unspecified: Secondary | ICD-10-CM

## 2018-08-21 DIAGNOSIS — R748 Abnormal levels of other serum enzymes: Secondary | ICD-10-CM

## 2018-08-21 DIAGNOSIS — I10 Essential (primary) hypertension: Secondary | ICD-10-CM | POA: Diagnosis not present

## 2018-08-21 DIAGNOSIS — K219 Gastro-esophageal reflux disease without esophagitis: Secondary | ICD-10-CM

## 2018-08-21 DIAGNOSIS — E1165 Type 2 diabetes mellitus with hyperglycemia: Secondary | ICD-10-CM

## 2018-08-21 DIAGNOSIS — E782 Mixed hyperlipidemia: Secondary | ICD-10-CM

## 2018-08-21 DIAGNOSIS — D500 Iron deficiency anemia secondary to blood loss (chronic): Secondary | ICD-10-CM

## 2018-08-21 MED ORDER — ALBUTEROL SULFATE HFA 108 (90 BASE) MCG/ACT IN AERS: 2.0000 | INHALATION_SPRAY | RESPIRATORY_TRACT | 1 refills | Status: DC | PRN

## 2018-08-21 NOTE — Patient Instructions (Addendum)
Please talk to your kidney doctor about whether or not you should be on a type of medicine called an "ARB" to help protect your kidneys Your blood pressure today is 122/80 on apparently no blood pressure medicines Check with your kidney doctor about perhaps starting back on low dose losartan or something similar if he agrees  Check out the information at familydoctor.org entitled "Nutrition for Weight Loss: What You Need to Know about Fad Diets" Try to lose between 1-2 pounds per week by taking in fewer calories and burning off more calories You can succeed by limiting portions, limiting foods dense in calories and fat, becoming more active, and drinking 8 glasses of water a day (64 ounces) Don't skip meals, especially breakfast, as skipping meals may alter your metabolism Do not use over-the-counter weight loss pills or gimmicks that claim rapid weight loss A healthy BMI (or body mass index) is between 18.5 and 24.9 You can calculate your ideal BMI at the NIH website ClubMonetize.fr  Try to limit saturated fats in your diet (bologna, hot dogs, barbeque, cheeseburgers, hamburgers, steak, bacon, sausage, cheese, etc.) and get more fresh fruits, vegetables, and whole grains  Please do see your eye doctor regularly, and have your eyes examined every year (or more often per his or her recommendation) Check your feet every night and let me know right away of any sores, infections, numbness, etc. Try to limit sweets, white bread, white rice, white potatoes It is okay with me for you to not check your fingerstick blood sugars unless you are interested and feel it would be helpful for you

## 2018-08-21 NOTE — Assessment & Plan Note (Addendum)
Check labs; avoid NSAIDs; patient is apparently not on an ARB right now; I urged him to consult with his nephrologist to see if this is recommended; I don't have any record that I stopped this

## 2018-08-21 NOTE — Progress Notes (Signed)
BP 122/80   Pulse 99   Temp 98 F (36.7 C)   Ht 5\' 11"  (1.803 m)   Wt 200 lb 9.6 oz (91 kg)   SpO2 98%   BMI 27.98 kg/m    Subjective:    Patient ID: Paul Spears, male    DOB: 18-Mar-1946, 73 y.o.   MRN: [id number]  HPI: Paul Spears is a 73 y.o. male  Chief Complaint  Patient presents with  . Follow-up    HPI Here for f/u Would like an inhaler; little shortness of breath just when it rains; no wheezing, no cough; going on for years; another doctor gave him an inhaler that worked Itching behind his ears for a long time; itched there, and then got in his eyebrows; treated with topical med by an eye doctor Diabetes; FSBS this morning was 114; 150 if eating something he shoulder; no problems with foot; eye exam UTD; flu and PNA shots are UTD  Lab Results  Component Value Date   HGBA1C 6.9 (H) 04/30/2018   High cholesterol; on statin and zetia; limiting portions; no bacon or sausage; eats veggie pizzas  Lab Results  Component Value Date   CHOL 105 04/30/2018   HDL 40 (L) 04/30/2018   LDLCALC 48 04/30/2018   TRIG 87 04/30/2018   CHOLHDL 2.6 04/30/2018   Hypertension He is not sure if he is on losartan; he can't find the bottle; he is taking HCTZ, but then we realized that it isn't in his bag; he says if he does not have the bottle, he is not taking it; right now, BP controlled on nothing therefore  Depression screen [facility] 2/9 08/21/2018 06/19/2018 01/28/2018 10/28/2017 07/24/2017  Decreased Interest 0 0 0 0 0  Down, Depressed, Hopeless 0 2 0 0 0  PHQ - 2 Score 0 2 0 0 0  Altered sleeping 0 1 - - -  Tired, decreased energy 0 0 - - -  Change in appetite 0 0 - - -  Feeling bad or failure about yourself  0 1 - - -  Trouble concentrating 0 0 - - -  Moving slowly or fidgety/restless 0 0 - - -  Suicidal thoughts 0 0 - - -  PHQ-9 Score 0 4 - - -  Difficult doing work/chores Not difficult at all - - - -  MD note: not depressed  Fall Risk  08/21/2018 06/19/2018 04/30/2018  01/28/2018 11/21/2017  Falls in the past year? 1 1 1  No No  Number falls in past yr: 0 1 0 - -  Injury with Fall? 0 0 0 - -  Risk for fall due to : - History of fall(s) - - -  Follow up - Falls evaluation completed;Falls prevention discussed - - -    Relevant past medical, surgical, family and social history reviewed Past Medical History:  Diagnosis Date  . Allergy   . Diabetes mellitus without complication ([place])   . GERD (gastroesophageal reflux disease)   . Hyperlipidemia   . Hypertension   . Iron deficiency anemia 11/11/2017  . Kidney disease, chronic, stage III (GFR 30-59 ml/min) (HCC)    Past Surgical History:  Procedure Laterality Date  . CARPAL TUNNEL RELEASE Right   . ESOPHAGOGASTRODUODENOSCOPY (EGD) WITH PROPOFOL N/A 10/22/2016   Procedure: ESOPHAGOGASTRODUODENOSCOPY (EGD) WITH PROPOFOL;  Surgeon: Lollie Sails, MD;  [facility] ENDOSCOPY;  Service: Endoscopy;  Laterality: N/A;  . ESOPHAGOGASTRODUODENOSCOPY (EGD) WITH PROPOFOL N/A 07/14/2018   Procedure: ESOPHAGOGASTRODUODENOSCOPY (EGD) WITH  PROPOFOL;  Surgeon: Lucilla Lame, MD;  Location[facility] ENDOSCOPY;  Service: Endoscopy;  Laterality: N/A;  . FRACTURE SURGERY Left    arm  . SHOULDER ARTHROSCOPY WITH SUBACROMIAL DECOMPRESSION AND BICEP TENDON REPAIR Right 04/16/2018   Procedure: ARTHROSCOPIC  BICEP TENOTOMY;  Surgeon: Thornton Park, MD;  Location: [facility] ORS;  Service: Orthopedics;  Laterality: Right;   Family History  Problem Relation Age of Onset  . Hypertension Father   . Heart attack Father   . Heart disease Sister   . Hypertension Sister    Social History   Tobacco Use  . Smoking status: Never Smoker  . Smokeless tobacco: Never Used  Substance Use Topics  . Alcohol use: No  . Drug use: No     Office Visit from 08/21/2018 in [facility]  AUDIT-C Score  0      Interim medical history since last visit reviewed. Allergies and medications reviewed  Review of Systems Per HPI  unless specifically indicated above     Objective:    BP 122/80   Pulse 99   Temp 98 F (36.7 C)   Ht 5\' 11"  (1.803 m)   Wt 200 lb 9.6 oz (91 kg)   SpO2 98%   BMI 27.98 kg/m   Wt Readings from Last 3 Encounters:  08/21/18 200 lb 9.6 oz (91 kg)  07/14/18 201 lb 11.5 oz (91.5 kg)  07/07/18 201 lb 6.4 oz (91.4 kg)    Physical Exam Constitutional:      General: He is not in acute distress.    Appearance: He is well-developed.  HENT:     Head: Normocephalic and atraumatic.  Eyes:     General: No scleral icterus. Neck:     Thyroid: No thyromegaly.  Cardiovascular:     Rate and Rhythm: Normal rate and regular rhythm.  Pulmonary:     Effort: Pulmonary effort is normal.     Breath sounds: Normal breath sounds.  Abdominal:     General: Bowel sounds are normal. There is no distension.     Palpations: Abdomen is soft.  Skin:    General: Skin is warm and dry.     Coloration: Skin is not pale.  Neurological:     Coordination: Coordination normal.  Psychiatric:        Behavior: Behavior normal.        Thought Content: Thought content normal.        Judgment: Judgment normal.    Diabetic Foot Form - Detailed   Diabetic Foot Exam - detailed Diabetic Foot exam was performed with the following findings:  Yes 08/21/2018 10:58 AM  Visual Foot Exam completed.:  Yes  Pulse Foot Exam completed.:  Yes  Right Dorsalis Pedis:  Present Left Dorsalis Pedis:  Present  Sensory Foot Exam Completed.:  Yes Semmes-Weinstein Monofilament Test R Site 1-Great Toe:  Pos L Site 1-Great Toe:  Pos         Results for orders placed or performed during the hospital encounter of 07/14/18  Glucose, capillary  Result Value Ref Range   Glucose-Capillary 119 (H) 70 - 99 mg/dL  Surgical pathology  Result Value Ref Range   SURGICAL PATHOLOGY      Surgical Pathology CASE: [id number] PATIENT: Paul Spears Surgical Pathology Report     SPECIMEN SUBMITTED: A. GEJ; cbx  CLINICAL  HISTORY: None provided  PRE-OPERATIVE DIAGNOSIS: Barrett's esophagus K22.70  POST-OPERATIVE DIAGNOSIS: Hiatal hernia, irregular Z line     DIAGNOSIS: A.  GEJ; COLD  BIOPSY: - SQUAMOCOLUMNAR MUCOSA WITH CHANGES COMPATIBLE WITH REFLUX. - PAS F STAIN IS NEGATIVE FOR YEAST / FUNGUS. - NEGATIVE FOR INTESTINAL METAPLASIA, DYSPLASIA, AND MALIGNANCY.   GROSS DESCRIPTION: A. Labeled: CBX GEJ Received: Formalin Tissue fragment(s): 1 Size: 0.2 cm Description: Tan soft tissue fragment Entirely submitted in A1.   Final Diagnosis performed by Quay Burow, MD.   Electronically signed 07/16/2018 1:21:21PM The electronic signature indicates that the named Attending Pathologist has evaluated the specimen  Technical component performed at [facility], [address] [place], [place] [address] Lab: [phone] Dir: Rush Farmer, MD, MMM   Professional component performed at [facility], [facility], [place], [place], [place] [address] Lab: [phone] Dir: Dellia Nims. Rubinas, MD       Assessment & Plan:   Problem List Items Addressed This Visit      Cardiovascular and Mediastinum   HTN (hypertension) - Primary    Controlled apparently on no meds at this time; pt will consult with his kidney doctor        Digestive   GERD (gastroesophageal reflux disease)   Barrett esophagus    Managed by GI; continue PPI        Endocrine   Uncontrolled type 2 diabetes with stage 3 chronic kidney disease GFR 30-59 ([place])    Check labs; foot exam by MD; eye exam yearly      Relevant Orders   Hemoglobin A1c     Genitourinary   Chronic kidney disease (CKD), stage III (moderate) ([place])    Check labs; avoid NSAIDs; patient is apparently not on an ARB right now; I urged him to consult with his nephrologist to see if this is recommended; I don't have any record that I stopped this      Relevant Orders   COMPLETE METABOLIC PANEL WITH GFR     Other   Hyperlipidemia    Relevant Orders   Lipid panel   Microalbuminuria   Relevant Orders   Microalbumin / creatinine urine ratio   High alkaline phosphatase   Relevant Orders   COMPLETE METABOLIC PANEL WITH GFR   Iron deficiency anemia    Check CBC today      Relevant Orders   CBC       Follow up plan: Return in about 6 months (around 02/21/2019) for follow-up visit with Dr. Sanda Klein or just after.  An after-visit summary was printed and given to the patient at [facility].  Please see the patient instructions which may contain other information and recommendations beyond what is mentioned above in the assessment and plan.  Meds ordered this encounter  Medications  . albuterol (PROAIR HFA) 108 (90 Base) MCG/ACT inhaler    Sig: Inhale 2 puffs into the lungs every 4 (four) hours as needed for wheezing or shortness of breath.    Dispense:  1 Inhaler    Refill:  1    Orders Placed This Encounter  Procedures  . CBC  . Microalbumin / creatinine urine ratio  . Lipid panel  . Hemoglobin A1c  . COMPLETE METABOLIC PANEL WITH GFR

## 2018-08-21 NOTE — Assessment & Plan Note (Signed)
>>  ASSESSMENT AND PLAN FOR CHRONIC KIDNEY DISEASE (CKD), STAGE III (MODERATE) (HCC) WRITTEN ON 08/21/2018  2:01 PM BY LADA, MELINDA P, MD  Check labs; avoid NSAIDs; patient is apparently not on an ARB right now; I urged him to consult with his nephrologist to see if this is recommended; I don't have any record that I stopped this

## 2018-08-21 NOTE — Assessment & Plan Note (Signed)
Check CBC today.  

## 2018-08-21 NOTE — Assessment & Plan Note (Signed)
Check labs; foot exam by MD; eye exam yearly

## 2018-08-21 NOTE — Assessment & Plan Note (Signed)
Managed by GI; continue PPI

## 2018-08-21 NOTE — Assessment & Plan Note (Addendum)
Controlled apparently on no meds at this time; pt will consult with his kidney doctor

## 2018-08-22 ENCOUNTER — Other Ambulatory Visit: Payer: Self-pay | Admitting: Family Medicine

## 2018-08-22 LAB — MICROALBUMIN / CREATININE URINE RATIO
Creatinine, Urine: 219 mg/dL (ref 20–320)
Microalb Creat Ratio: 8 mcg/mg creat (ref <30)
Microalb, Ur: 1.7 mg/dL

## 2018-08-22 LAB — COMPLETE METABOLIC PANEL WITH GFR
AG RATIO: 2.1 (calc) (ref 1.0–2.5)
ALT: 21 U/L (ref 9–46)
AST: 19 U/L (ref 10–35)
Albumin: 4.7 g/dL (ref 3.6–5.1)
Alkaline phosphatase (APISO): 119 U/L (ref 35–144)
BUN/Creatinine Ratio: 17 (calc) (ref 6–22)
BUN: 22 mg/dL (ref 7–25)
CO2: 23 mmol/L (ref 20–32)
Calcium: 9.7 mg/dL (ref 8.6–10.3)
Chloride: 103 mmol/L (ref 98–110)
Creat: 1.29 mg/dL — ABNORMAL HIGH (ref 0.70–1.18)
GFR, Est African American: 64 mL/min/{1.73_m2} (ref >=60)
GFR, Est Non African American: 55 mL/min/{1.73_m2} — ABNORMAL LOW (ref >=60)
GLOBULIN: 2.2 g/dL (ref 1.9–3.7)
Glucose, Bld: 122 mg/dL — ABNORMAL HIGH (ref 65–99)
Potassium: 5.2 mmol/L (ref 3.5–5.3)
Sodium: 137 mmol/L (ref 135–146)
Total Bilirubin: 1 mg/dL (ref 0.2–1.2)
Total Protein: 6.9 g/dL (ref 6.1–8.1)

## 2018-08-22 LAB — LIPID PANEL
Cholesterol: 114 mg/dL (ref <200)
HDL: 36 mg/dL — ABNORMAL LOW (ref >=40)
LDL Cholesterol (Calc): 59 mg/dL (calc)
Non-HDL Cholesterol (Calc): 78 mg/dL (calc) (ref <130)
Total CHOL/HDL Ratio: 3.2 (calc) (ref <5.0)
Triglycerides: 103 mg/dL (ref <150)

## 2018-08-22 LAB — CBC
HCT: 41.5 % (ref 38.5–50.0)
Hemoglobin: 14.2 g/dL (ref 13.2–17.1)
MCH: 30.2 pg (ref 27.0–33.0)
MCHC: 34.2 g/dL (ref 32.0–36.0)
MCV: 88.3 fL (ref 80.0–100.0)
MPV: 9.9 fL (ref 7.5–12.5)
Platelets: 378 10*3/uL (ref 140–400)
RBC: 4.7 10*6/uL (ref 4.20–5.80)
RDW: 13.6 % (ref 11.0–15.0)
WBC: 7.3 10*3/uL (ref 3.8–10.8)

## 2018-08-22 LAB — HEMOGLOBIN A1C
Hgb A1c MFr Bld: 7.7 % of total Hgb — ABNORMAL HIGH (ref <5.7)
MEAN PLASMA GLUCOSE: 174 (calc)
eAG (mmol/L): 9.7 (calc)

## 2018-08-22 MED ORDER — DULAGLUTIDE 1.5 MG/0.5ML ~~LOC~~ SOAJ: SUBCUTANEOUS | 5 refills | Status: DC

## 2018-08-22 MED ORDER — PIOGLITAZONE HCL 15 MG PO TABS: 15.0000 mg | ORAL_TABLET | Freq: Every day | ORAL | 0 refills | Status: DC

## 2018-08-22 NOTE — Progress Notes (Signed)
Increase trulicity dose Encouraged weight loss Reviewed last echo in [place] FUNCTION  WITH MILD LVH MILD VALVULAR REGURGITATION (See above) NO VALVULAR STENOSIS EF >55% ------------------------------------------ Will add actos 15 mg with instructions to stop immediately and seek medical attention for fluid build-up, DOE, etc.

## 2018-09-15 ENCOUNTER — Other Ambulatory Visit: Payer: Self-pay

## 2018-09-15 ENCOUNTER — Ambulatory Visit (INDEPENDENT_AMBULATORY_CARE_PROVIDER_SITE_OTHER): Payer: Medicare Other | Admitting: Nurse Practitioner

## 2018-09-15 ENCOUNTER — Encounter: Payer: Self-pay | Admitting: Nurse Practitioner

## 2018-09-15 ENCOUNTER — Other Ambulatory Visit: Payer: Self-pay | Admitting: Family Medicine

## 2018-09-15 VITALS — BP 138/78 | HR 89 | Temp 97.4°F | Resp 16 | Ht 71.0 in | Wt 204.4 lb

## 2018-09-15 DIAGNOSIS — Z5181 Encounter for therapeutic drug level monitoring: Secondary | ICD-10-CM | POA: Diagnosis not present

## 2018-09-15 DIAGNOSIS — E1122 Type 2 diabetes mellitus with diabetic chronic kidney disease: Secondary | ICD-10-CM

## 2018-09-15 DIAGNOSIS — IMO0002 Reserved for concepts with insufficient information to code with codable children: Secondary | ICD-10-CM

## 2018-09-15 DIAGNOSIS — N183 Chronic kidney disease, stage 3 unspecified: Secondary | ICD-10-CM

## 2018-09-15 DIAGNOSIS — E1165 Type 2 diabetes mellitus with hyperglycemia: Principal | ICD-10-CM

## 2018-09-15 DIAGNOSIS — E782 Mixed hyperlipidemia: Secondary | ICD-10-CM | POA: Diagnosis not present

## 2018-09-15 MED ORDER — LISINOPRIL 5 MG PO TABS: 2.5000 mg | ORAL_TABLET | Freq: Every day | ORAL | 0 refills | Status: DC

## 2018-09-15 NOTE — Patient Instructions (Signed)
-  Please go home and pull out all of your medications and tell us what you are taking and their dosages.  - We are adding you on a 2.5mg  of lisinopril daily, to protect your kidneys. Please come back in 7 days for a labs ONLY visit.   - Please write down your blood sugars and turn them at your one week labs only visit.   To reduce your risk of COVID-19 it is the same as that of any infection please use the same precautions used for avoiding the common cold or flu:  Marland Kitchen Wash your hands often with soap and warm water for at least 20 seconds.  If soap and water are not readily available, use an alcohol-based hand sanitizer with at least 60% alcohol.  . If coughing or sneezing, cover your mouth and nose by coughing or sneezing into the elbow areas of your shirt or coat, into a tissue or into your sleeve (not your hands). . Avoid shaking hands with others and consider head nods or verbal greetings only. . Avoid touching your eyes, nose, or mouth with unwashed hands.  . Avoid close contact with people who are sick. . Avoid places or events with large numbers of people in one location, like concerts or sporting events. . Carefully consider travel plans you have or are making. . If you are planning any travel outside or inside the [place], visit the CDC's Travelers' Health webpage for the latest health notices. . If you have some symptoms but not all symptoms, continue to monitor at home and seek medical attention if your symptoms worsen. . If you are having a medical emergency, call 911.   [place] / e-Visit: [email or web address]         [facility]: [facility]: [phone]                   [facility]: [phone]

## 2018-09-15 NOTE — Addendum Note (Signed)
Addended by: Fredderick Severance on: 09/15/2018 04:59 PM   Modules accepted: Orders

## 2018-09-15 NOTE — Telephone Encounter (Signed)
Lab Results  Component Value Date   CREATININE 1.29 (H) 08/21/2018

## 2018-09-15 NOTE — Progress Notes (Addendum)
Name: Paul Spears   MRN: [id number]    DOB: 1945/09/06   Date:09/15/2018       Progress Note  Subjective  Chief Complaint  Chief Complaint  Patient presents with  . Follow-up    per Dr. Sanda Klein' note on his labs, patient was to come in for a 4 week f/u    HPI  Patient was seen on 08/21/2018 by his PCP, diabetes noted to have increased from 6.9 to 7.7. Patient was instructed to increase his trulicity from 0.75mg  to 1.5mg  and add actos 15mg  daily and check blood sugars daily and return in 4 weeks with log. If sugars began to get low he was instructed to cut glimepiride from 4mg  into 2mg  daily.   Today he presented without blood sugar readings or appointment and is unsure of what specific medications he is taking but states he picks the all up from the pharmacy. He feels his blood sugar was so high because he is so forgetful and has probably missed his medication dosages. He also states he believes he has already been taking the 1.5mg  of trulicity. From what he recalls fasting blood sugars at home recently averages between  90-130's. Did have one reading around 39- states he felt fine, states he likes lower readings so he can eat candy.   Diabetes Meds Trulicity 1.5mg /weekly Metformin 1000mg  BID Glimepiride 4mg  daily  ____________ Actos 15mg  daily  Wt Readings from Last 3 Encounters:  09/15/18 204 lb 6.4 oz (92.7 kg)  08/21/18 200 lb 9.6 oz (91 kg)  07/14/18 201 lb 11.5 oz (91.5 kg)     PHQ2/9: Depression screen [facility] 2/9 09/15/2018 08/21/2018 06/19/2018 01/28/2018 10/28/2017  Decreased Interest 0 0 0 0 0  Down, Depressed, Hopeless 0 0 2 0 0  PHQ - 2 Score 0 0 2 0 0  Altered sleeping 0 0 1 - -  Tired, decreased energy 0 0 0 - -  Change in appetite 0 0 0 - -  Feeling bad or failure about yourself  0 0 1 - -  Trouble concentrating 0 0 0 - -  Moving slowly or fidgety/restless 0 0 0 - -  Suicidal thoughts 0 0 0 - -  PHQ-9 Score 0 0 4 - -  Difficult doing work/chores Not difficult at all Not  difficult at all - - -     PHQ reviewed. Negative  Patient Active Problem List   Diagnosis Date Noted  . Other specified diseases of esophagus   . Iron deficiency anemia 11/11/2017  . High alkaline phosphatase 10/28/2017  . Low back pain 01/22/2017  . Pain in finger of left hand 10/23/2016  . DDD (degenerative disc disease), cervical 12/07/2015  . Muscle pain, cervical 12/04/2015  . Absent peripheral pulse 03/22/2015  . GERD (gastroesophageal reflux disease) 09/21/2014  . Absolute anemia 09/21/2014  . Routine general medical examination at a health care facility 09/21/2014  . Barrett esophagus 09/21/2014  . Cervical osteoarthritis 09/21/2014  . Chronic kidney disease (CKD), stage III (moderate) ([place]) 09/21/2014  . Pain in shoulder 09/21/2014  . Uncontrolled type 2 diabetes with stage 3 chronic kidney disease GFR 30-59 ([place]) 09/21/2014  . Failure of erection 09/21/2014  . Esophagitis 09/21/2014  . Microalbuminuria 09/21/2014  . Osteopenia 09/21/2014  . At risk for falling 09/21/2014  . Dermatitis seborrheica 09/21/2014  . Breath shortness 09/21/2014  . Tenosynovitis 09/21/2014  . HTN (hypertension) 05/20/2014  . Hyperlipidemia 05/20/2014  . Dyspnea on exertion 05/20/2014    Past  Medical History:  Diagnosis Date  . Allergy   . Diabetes mellitus without complication ([place])   . GERD (gastroesophageal reflux disease)   . Hyperlipidemia   . Hypertension   . Iron deficiency anemia 11/11/2017  . Kidney disease, chronic, stage III (GFR 30-59 ml/min) (HCC)     Past Surgical History:  Procedure Laterality Date  . CARPAL TUNNEL RELEASE Right   . ESOPHAGOGASTRODUODENOSCOPY (EGD) WITH PROPOFOL N/A 10/22/2016   Procedure: ESOPHAGOGASTRODUODENOSCOPY (EGD) WITH PROPOFOL;  Surgeon: Lollie Sails, MD;  Location: [facility] ENDOSCOPY;  Service: Endoscopy;  Laterality: N/A;  . ESOPHAGOGASTRODUODENOSCOPY (EGD) WITH PROPOFOL N/A 07/14/2018   Procedure: ESOPHAGOGASTRODUODENOSCOPY (EGD) WITH  PROPOFOL;  Surgeon: Lucilla Lame, MD;  Location: [facility] ENDOSCOPY;  Service: Endoscopy;  Laterality: N/A;  . FRACTURE SURGERY Left    arm  . SHOULDER ARTHROSCOPY WITH SUBACROMIAL DECOMPRESSION AND BICEP TENDON REPAIR Right 04/16/2018   Procedure: ARTHROSCOPIC  BICEP TENOTOMY;  Surgeon: Thornton Park, MD;  Location: [facility] ORS;  Service: Orthopedics;  Laterality: Right;    Social History   Tobacco Use  . Smoking status: Never Smoker  . Smokeless tobacco: Never Used  Substance Use Topics  . Alcohol use: No     Current Outpatient Medications:  .  albuterol (PROAIR HFA) 108 (90 Base) MCG/ACT inhaler, Inhale 2 puffs into the lungs every 4 (four) hours as needed for wheezing or shortness of breath., Disp: 1 Inhaler, Rfl: 1 .  atorvastatin (LIPITOR) 80 MG tablet, Take 1 tablet (80 mg total) by mouth daily., Disp: 90 tablet, Rfl: 3 .  Blood Glucose Monitoring Suppl (ACCU-CHEK AVIVA) device, USE AS DIRECTED, Disp: 100 each, Rfl: 0 .  Cholecalciferol (VITAMIN D3 PO), Take 1 capsule by mouth daily., Disp: , Rfl:  .  Dulaglutide (TRULICITY) 1.5 YW/7.3XT SOPN, Inject 1.5 mg subcutaneously once a week, Disp: 4 pen, Rfl: 5 .  esomeprazole (NEXIUM) 20 MG capsule, Take 20 mg by mouth daily. , Disp: , Rfl:  .  ezetimibe (ZETIA) 10 MG tablet, Take 1 tablet (10 mg total) by mouth daily., Disp: 90 tablet, Rfl: 3 .  fluticasone (FLONASE) 50 MCG/ACT nasal spray, Place 2 sprays into both nostrils daily as needed. (Patient taking differently: Place 2 sprays into both nostrils daily. ), Disp: 16 g, Rfl: 11 .  glimepiride (AMARYL) 4 MG tablet, Take 1 tablet (4 mg total) by mouth daily with breakfast., Disp: 90 tablet, Rfl: 0 .  glucose blood (ACCU-CHEK AVIVA PLUS) test strip, Check fingerstick blood sugar once a day; LON 99 months, Dx E11.22, Disp: 100 each, Rfl: 3 .  metFORMIN (GLUCOPHAGE) 1000 MG tablet, Take 1 tablet (1,000 mg total) by mouth 2 (two) times daily with a meal., Disp: 180 tablet, Rfl: 0 .   pioglitazone (ACTOS) 15 MG tablet, Take 1 tablet (15 mg total) by mouth daily. Stop and seek medical attention if weight gain, leg swelling, shortness of breath, Disp: 30 tablet, Rfl: 0  No Known Allergies  ROS  No other specific complaints in a complete review of systems (except as listed in HPI above).  Objective  Vitals:   09/15/18 0854  BP: 138/78  Pulse: 89  Resp: 16  Temp: (!) 97.4 F (36.3 C)  TempSrc: Oral  SpO2: 97%  Weight: 204 lb 6.4 oz (92.7 kg)  Height: 5\' 11"  (1.803 m)     Body mass index is 28.51 kg/m.  Nursing Note and Vital Signs reviewed.  Physical Exam Vitals signs reviewed.  Constitutional:      Appearance: He is  well-developed.  HENT:     Head: Normocephalic and atraumatic.  Neck:     Musculoskeletal: Normal range of motion and neck supple.     Vascular: No carotid bruit.  Cardiovascular:     Heart sounds: Normal heart sounds.  Pulmonary:     Effort: Pulmonary effort is normal.     Breath sounds: Normal breath sounds.  Abdominal:     General: Bowel sounds are normal.     Palpations: Abdomen is soft.     Tenderness: There is no abdominal tenderness.  Musculoskeletal: Normal range of motion.  Skin:    General: Skin is warm and dry.     Capillary Refill: Capillary refill takes less than 2 seconds.  Neurological:     Mental Status: He is alert and oriented to person, place, and time.     GCS: GCS eye subscore is 4. GCS verbal subscore is 5. GCS motor subscore is 6.     Sensory: No sensory deficit.  Psychiatric:        Speech: Speech normal.        Behavior: Behavior normal.        Thought Content: Thought content normal.        Judgment: Judgment normal.        No results found for this or any previous visit (from the past 48 hour(s)).  Assessment & Plan  1. Uncontrolled type 2 diabetes with stage 3 chronic kidney disease GFR 30-59 (HCC) Recommend ACEi for nephroprotection, denies allergy, discussed importance of medication  adherence and prevention of hypoglycemia. Since he is unsure of what exactly he is taking he will go home and call and tell us meds and dosages so we can make appropropriate adjustments- hopefully titrate him off of glimepiride. Given blood sugar log to fill out and drop off for 7 day labs ONLY appointment - lisinopril (PRINIVIL,ZESTRIL) 5 MG tablet; Take 0.5 tablets (2.5 mg total) by mouth daily.  Dispense: 45 tablet; Refill: 0 - Ambulatory referral to Chronic Care Management Services  2. Chronic kidney disease (CKD), stage III (moderate) (HCC) Add ACEi for nephroprotection  - Ambulatory referral to Chronic Care Management Services  3. Mixed hyperlipidemia Discussed diet  - Ambulatory referral to Chronic Care Management Services  4. Medication monitoring encounter - Basic Metabolic Panel (BMET); Future   Face-to-face time with patient was more than 25 minutes, >50% time spent counseling and coordination of care  ________________ 09/15/2018; 4:58pm Reviewed nephrology notes and noted hyperkalemia- was taken off losartan; discontinued lisinopril. Pharmacy and patient will be notified.

## 2018-09-18 ENCOUNTER — Ambulatory Visit: Payer: Self-pay

## 2018-09-18 DIAGNOSIS — E1165 Type 2 diabetes mellitus with hyperglycemia: Principal | ICD-10-CM

## 2018-09-18 DIAGNOSIS — N183 Chronic kidney disease, stage 3 unspecified: Secondary | ICD-10-CM

## 2018-09-18 DIAGNOSIS — E1122 Type 2 diabetes mellitus with diabetic chronic kidney disease: Secondary | ICD-10-CM

## 2018-09-18 DIAGNOSIS — IMO0002 Reserved for concepts with insufficient information to code with codable children: Secondary | ICD-10-CM

## 2018-09-18 NOTE — Chronic Care Management (AMB) (Signed)
  Chronic Care Management   Note  09/18/2018 Name: NINA HOAR MRN: [id number] DOB: 11-05-1945  Celene Skeen. Lybrand is a 73 year old male who sees Dr. Enid Derry for primary care. Suezanne Cheshire- NP asked the CCM team to consult the patient for chronic care management and care coordination, specifically need for pill package secondary to forgetfulness and non-compliance. Patient has a history of but not limited to HTN, Uncontrolled DM2 with recent A1C 9.7, CKD 3, Hyperlipidemia, and DOE . Referral was placed 09/15/2018 during last office visit. Telephone outreach to patient today to introduce CCM services.  Mr. Salido was given information about Chronic Care Management services today including:  1. CCM service includes personalized support from designated clinical staff supervised by his physician, including individualized plan of care and coordination with other care providers 2. 24/7 contact phone numbers for assistance for urgent and routine care needs. 3. Service will only be billed when office clinical staff spend 20 minutes or more in a month to coordinate care. 4. Only one practitioner may furnish and bill the service in a calendar month. 5. The patient may stop CCM services at any time (effective at the end of the month) by phone call to the office staff. 6. The patient will be responsible for cost sharing (co-pay) of up to 20% of the service fee (after annual deductible is met).  Patient did not agree to services and does not wish to consider at this time.     Plan: Referral will be closed however will remain available if patient wishes to engage in the future PCP will be notified. CCM RN CM will request that PCP have additional conversation with patient regarding need for CCM Services.    Amiera Herzberg E. Rollene Rotunda, RN, BSN Nurse Care Coordinator [facility] Care Management  [phone]

## 2018-09-27 ENCOUNTER — Other Ambulatory Visit: Payer: Self-pay | Admitting: Family Medicine

## 2018-09-27 DIAGNOSIS — E1165 Type 2 diabetes mellitus with hyperglycemia: Principal | ICD-10-CM

## 2018-09-27 DIAGNOSIS — E1122 Type 2 diabetes mellitus with diabetic chronic kidney disease: Secondary | ICD-10-CM

## 2018-09-27 DIAGNOSIS — IMO0002 Reserved for concepts with insufficient information to code with codable children: Secondary | ICD-10-CM

## 2018-09-27 DIAGNOSIS — N183 Chronic kidney disease, stage 3 (moderate): Principal | ICD-10-CM

## 2018-09-28 NOTE — Telephone Encounter (Signed)
Left voicemail with pharmacy 

## 2018-09-28 NOTE — Telephone Encounter (Signed)
I approved one year of strips in December of 2019 He should not need a new refill from me yet Please resolve with pharmacy Thank you

## 2018-09-29 ENCOUNTER — Other Ambulatory Visit: Payer: Self-pay | Admitting: Family Medicine

## 2018-11-16 ENCOUNTER — Other Ambulatory Visit: Payer: Self-pay

## 2018-11-16 ENCOUNTER — Telehealth: Payer: Self-pay | Admitting: Gastroenterology

## 2018-11-16 ENCOUNTER — Ambulatory Visit (INDEPENDENT_AMBULATORY_CARE_PROVIDER_SITE_OTHER): Payer: Medicare Other | Admitting: Nurse Practitioner

## 2018-11-16 ENCOUNTER — Encounter: Payer: Self-pay | Admitting: Nurse Practitioner

## 2018-11-16 VITALS — BP 136/80 | HR 99 | Temp 97.4°F | Resp 12 | Ht 71.0 in | Wt 190.4 lb

## 2018-11-16 DIAGNOSIS — R1033 Periumbilical pain: Secondary | ICD-10-CM | POA: Diagnosis not present

## 2018-11-16 DIAGNOSIS — K5900 Constipation, unspecified: Secondary | ICD-10-CM | POA: Diagnosis not present

## 2018-11-16 DIAGNOSIS — Z1211 Encounter for screening for malignant neoplasm of colon: Secondary | ICD-10-CM | POA: Diagnosis not present

## 2018-11-16 DIAGNOSIS — R195 Other fecal abnormalities: Secondary | ICD-10-CM

## 2018-11-16 DIAGNOSIS — T300 Burn of unspecified body region, unspecified degree: Secondary | ICD-10-CM

## 2018-11-16 MED ORDER — POLYETHYLENE GLYCOL 3350 17 GM/SCOOP PO POWD: 17.0000 g | Freq: Every day | ORAL | 1 refills | Status: DC | PRN

## 2018-11-16 MED ORDER — SILVER SULFADIAZINE 1 % EX CREA: 1.0000 "application " | TOPICAL_CREAM | Freq: Every day | CUTANEOUS | 0 refills | Status: DC

## 2018-11-16 NOTE — Patient Instructions (Addendum)
Please take docusate sodium 100mg  twice a day - Increase water intake - Increase activity as tolerated.  _______________  Rarely just as needed; Please follow direction on box:  - Take Polyethylene glycol 17 grams per day   _______ If you are still unable to have Bowel movement, having severe abdominal pain, or nausea and vomiting please get medical assistance immediately.   Burn Care, Adult A burn is an injury to the skin or the tissues under the skin. There are three types of burns:  First degree. These burns may cause the skin to be red and a bit swollen.  Second degree. These burns are very painful and cause the skin to be very red. The skin may also leak fluid, look shiny, and start to have blisters.  Third degree. These burns cause permanent damage. They turn the skin white or black and make it look charred, dry, and leathery. Taking care of your burn properly can help to prevent pain and infection. It can also help the burn to heal more quickly. How is this treated? Right after a burn:  Rinse or soak the burn under cool water. Do this for several minutes. Do not put ice on your burn. That can cause more damage.  Lightly cover the burn with a clean (sterile) cloth (dressing). Burn care  Raise (elevate) the injured area above the level of your heart while sitting or lying down.  Follow instructions from your doctor about: ? How to clean and take care of the burn. ? When to change and remove the cloth.  Check your burn every day for signs of infection. Check for: ? More redness, swelling, or pain. ? Warmth. ? Pus or a bad smell. Medicine   Take over-the-counter and prescription medicines only as told by your doctor.  If you were prescribed antibiotic medicine, take or apply it as told by your doctor. Do not stop using the antibiotic even if your condition improves. General instructions  To prevent infection: ? Do not put butter, oil, or other home treatments on the  burn. ? Do not scratch or pick at the burn. ? Do not break any blisters. ? Do not peel skin.  Do not rub your burn, even when you are cleaning it.  Protect your burn from the sun. Contact a doctor if:  Your condition does not get better.  Your condition gets worse.  You have a fever.  Your burn looks different or starts to have black or red spots on it.  Your burn feels warm to the touch.  Your pain is not controlled with medicine. Get help right away if:  You have redness, swelling, or pain at the site of the burn.  You have fluid, blood, or pus coming from your burn.  You have red streaks near the burn.  You have very bad pain. This information is not intended to replace advice given to you by your health care provider. Make sure you discuss any questions you have with your health care provider. Document Released: 03/12/2008 Document Revised: 01/14/2017 Document Reviewed: 11/21/2015 Elsevier Interactive Patient Education  2019 [employer].
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
## 2018-11-16 NOTE — Telephone Encounter (Signed)
Pt came by the office to schedule a colonoscopy with Dr. Allen Norris I informed him Ginger will call him to get that scheduled

## 2018-11-16 NOTE — Telephone Encounter (Signed)
Referral has been sent to our office. Pt will be contacted.

## 2018-11-17 LAB — COMPLETE METABOLIC PANEL WITH GFR
AG Ratio: 2.1 (calc) (ref 1.0–2.5)
ALT: 15 U/L (ref 9–46)
AST: 16 U/L (ref 10–35)
Albumin: 4.7 g/dL (ref 3.6–5.1)
Alkaline phosphatase (APISO): 97 U/L (ref 35–144)
BUN: 15 mg/dL (ref 7–25)
CO2: 25 mmol/L (ref 20–32)
Calcium: 9.8 mg/dL (ref 8.6–10.3)
Chloride: 105 mmol/L (ref 98–110)
Creat: 1.15 mg/dL (ref 0.70–1.18)
GFR, Est African American: 73 mL/min/{1.73_m2} (ref >=60)
GFR, Est Non African American: 63 mL/min/{1.73_m2} (ref >=60)
Globulin: 2.2 g/dL (calc) (ref 1.9–3.7)
Glucose, Bld: 109 mg/dL — ABNORMAL HIGH (ref 65–99)
Potassium: 4.7 mmol/L (ref 3.5–5.3)
Sodium: 140 mmol/L (ref 135–146)
Total Bilirubin: 0.8 mg/dL (ref 0.2–1.2)
Total Protein: 6.9 g/dL (ref 6.1–8.1)

## 2018-11-17 LAB — CBC WITH DIFFERENTIAL/PLATELET
Absolute Monocytes: 730 cells/uL (ref 200–950)
Basophils Absolute: 68 cells/uL (ref 0–200)
Basophils Relative: 0.9 %
Eosinophils Absolute: 228 cells/uL (ref 15–500)
Eosinophils Relative: 3 %
HCT: 38.6 % (ref 38.5–50.0)
Hemoglobin: 13.1 g/dL — ABNORMAL LOW (ref 13.2–17.1)
Lymphs Abs: 1862 cells/uL (ref 850–3900)
MCH: 30.4 pg (ref 27.0–33.0)
MCHC: 33.9 g/dL (ref 32.0–36.0)
MCV: 89.6 fL (ref 80.0–100.0)
MPV: 10.2 fL (ref 7.5–12.5)
Monocytes Relative: 9.6 %
Neutro Abs: 4712 cells/uL (ref 1500–7800)
Neutrophils Relative %: 62 %
Platelets: 336 10*3/uL (ref 140–400)
RBC: 4.31 10*6/uL (ref 4.20–5.80)
RDW: 13.8 % (ref 11.0–15.0)
Total Lymphocyte: 24.5 %
WBC: 7.6 10*3/uL (ref 3.8–10.8)

## 2018-11-23 ENCOUNTER — Ambulatory Visit (INDEPENDENT_AMBULATORY_CARE_PROVIDER_SITE_OTHER): Payer: Medicare Other | Admitting: Nurse Practitioner

## 2018-11-23 ENCOUNTER — Encounter: Payer: Self-pay | Admitting: Nurse Practitioner

## 2018-11-23 ENCOUNTER — Other Ambulatory Visit: Payer: Self-pay

## 2018-11-23 VITALS — BP 116/72 | HR 87 | Temp 97.6°F | Resp 12 | Ht 71.0 in | Wt 186.6 lb

## 2018-11-23 DIAGNOSIS — Z9109 Other allergy status, other than to drugs and biological substances: Secondary | ICD-10-CM

## 2018-11-23 DIAGNOSIS — K2100 Gastro-esophageal reflux disease with esophagitis, without bleeding: Secondary | ICD-10-CM

## 2018-11-23 DIAGNOSIS — IMO0002 Reserved for concepts with insufficient information to code with codable children: Secondary | ICD-10-CM

## 2018-11-23 DIAGNOSIS — E1122 Type 2 diabetes mellitus with diabetic chronic kidney disease: Secondary | ICD-10-CM | POA: Diagnosis not present

## 2018-11-23 DIAGNOSIS — N183 Chronic kidney disease, stage 3 (moderate): Secondary | ICD-10-CM

## 2018-11-23 DIAGNOSIS — M8580 Other specified disorders of bone density and structure, unspecified site: Secondary | ICD-10-CM | POA: Diagnosis not present

## 2018-11-23 DIAGNOSIS — E782 Mixed hyperlipidemia: Secondary | ICD-10-CM

## 2018-11-23 DIAGNOSIS — K22719 Barrett's esophagus with dysplasia, unspecified: Secondary | ICD-10-CM

## 2018-11-23 DIAGNOSIS — E1165 Type 2 diabetes mellitus with hyperglycemia: Secondary | ICD-10-CM

## 2018-11-23 MED ORDER — DULAGLUTIDE 1.5 MG/0.5ML ~~LOC~~ SOAJ: SUBCUTANEOUS | 1 refills | Status: DC

## 2018-11-23 MED ORDER — ACCU-CHEK MULTICLIX LANCETS MISC: 12 refills | Status: DC

## 2018-11-23 MED ORDER — GLIMEPIRIDE 1 MG PO TABS: 1.0000 mg | ORAL_TABLET | Freq: Every day | ORAL | 1 refills | Status: DC

## 2018-11-23 MED ORDER — EZETIMIBE 10 MG PO TABS: 10.0000 mg | ORAL_TABLET | Freq: Every day | ORAL | 1 refills | Status: DC

## 2018-11-23 MED ORDER — FLUTICASONE PROPIONATE 50 MCG/ACT NA SUSP: 2.0000 | Freq: Every day | NASAL | 11 refills | Status: DC | PRN

## 2018-11-23 MED ORDER — ATORVASTATIN CALCIUM 80 MG PO TABS: 80.0000 mg | ORAL_TABLET | Freq: Every day | ORAL | 1 refills | Status: DC

## 2018-11-23 MED ORDER — METFORMIN HCL 1000 MG PO TABS: 1000.0000 mg | ORAL_TABLET | Freq: Two times a day (BID) | ORAL | 1 refills | Status: DC

## 2018-11-23 NOTE — Progress Notes (Signed)
Name: Paul Spears   MRN: [id number]    DOB: 11-13-1945   Date:11/23/2018       Progress Note  Subjective  Chief Complaint  Chief Complaint  Patient presents with  . Follow-up    HPI  Diabetes Mellitus Patient is rx metformin 1000mg  BID, amaryl 4mg  daily and trulicity 1.5mg  weekly. Takes medications as prescribed with no missed doses a month.  Diet: Checks blood sugars 1-2 times daily. Fasting sugars ranging from 60-120 with an average of 90's Denies polyphagia, polydipsia, polyuria.  Lab Results  Component Value Date   HGBA1C 7.7 (H) 08/21/2018    Hyperlipidemia Patient rx atorvastatin 80 mg daily and zetia 10mg  daily Takes medications as prescribed with no missed doses a month.  Diet:has been working on improving diet- eating more salads  Denies myalgias Lab Results  Component Value Date   CHOL 114 08/21/2018   HDL 36 (L) 08/21/2018   LDLCALC 59 08/21/2018   TRIG 103 08/21/2018   CHOLHDL 3.2 08/21/2018   GERD Patient takes Nexium daily. Has barrett's esophagus. Avoids triggers- has been eating much healthier and losing weight. Notices less symptoms of esophagitus now.   Osteopenia Patient walks 2 miles a few days a week. Takes Vitamin D 10,000 IU weekly.   PHQ2/9: Depression screen [facility] 2/9 11/23/2018 11/16/2018 09/15/2018 08/21/2018 06/19/2018  Decreased Interest 0 0 0 0 0  Down, Depressed, Hopeless 0 0 0 0 2  PHQ - 2 Score 0 0 0 0 2  Altered sleeping 0 0 0 0 1  Tired, decreased energy 0 0 0 0 0  Change in appetite 0 0 0 0 0  Feeling bad or failure about yourself  0 0 0 0 1  Trouble concentrating 0 0 0 0 0  Moving slowly or fidgety/restless 0 0 0 0 0  Suicidal thoughts 0 0 0 0 0  PHQ-9 Score 0 0 0 0 4  Difficult doing work/chores Not difficult at all Not difficult at all Not difficult at all Not difficult at all -  Some recent data might be hidden     PHQ reviewed. Negative  Patient Active Problem List   Diagnosis Date Noted  . Other specified diseases of  esophagus   . Iron deficiency anemia 11/11/2017  . High alkaline phosphatase 10/28/2017  . Low back pain 01/22/2017  . Pain in finger of left hand 10/23/2016  . DDD (degenerative disc disease), cervical 12/07/2015  . Muscle pain, cervical 12/04/2015  . Absent peripheral pulse 03/22/2015  . GERD (gastroesophageal reflux disease) 09/21/2014  . Absolute anemia 09/21/2014  . Routine general medical examination at a health care facility 09/21/2014  . Barrett esophagus 09/21/2014  . Cervical osteoarthritis 09/21/2014  . Chronic kidney disease (CKD), stage III (moderate) ([place]) 09/21/2014  . Pain in shoulder 09/21/2014  . Uncontrolled type 2 diabetes with stage 3 chronic kidney disease GFR 30-59 ([place]) 09/21/2014  . Failure of erection 09/21/2014  . Esophagitis 09/21/2014  . Microalbuminuria 09/21/2014  . Osteopenia 09/21/2014  . At risk for falling 09/21/2014  . Dermatitis seborrheica 09/21/2014  . Breath shortness 09/21/2014  . Tenosynovitis 09/21/2014  . HTN (hypertension) 05/20/2014  . Hyperlipidemia 05/20/2014  . Dyspnea on exertion 05/20/2014    Past Medical History:  Diagnosis Date  . Allergy   . Diabetes mellitus without complication ([place])   . GERD (gastroesophageal reflux disease)   . Hyperlipidemia   . Hypertension   . Iron deficiency anemia 11/11/2017  . Kidney disease, chronic, stage III (  GFR 30-59 ml/min) (HCC)     Past Surgical History:  Procedure Laterality Date  . CARPAL TUNNEL RELEASE Right   . ESOPHAGOGASTRODUODENOSCOPY (EGD) WITH PROPOFOL N/A 10/22/2016   Procedure: ESOPHAGOGASTRODUODENOSCOPY (EGD) WITH PROPOFOL;  Surgeon: Lollie Sails, MD;  Location: [facility] ENDOSCOPY;  Service: Endoscopy;  Laterality: N/A;  . ESOPHAGOGASTRODUODENOSCOPY (EGD) WITH PROPOFOL N/A 07/14/2018   Procedure: ESOPHAGOGASTRODUODENOSCOPY (EGD) WITH PROPOFOL;  Surgeon: Lucilla Lame, MD;  Location: [facility] ENDOSCOPY;  Service: Endoscopy;  Laterality: N/A;  . FRACTURE SURGERY Left    arm  .  SHOULDER ARTHROSCOPY WITH SUBACROMIAL DECOMPRESSION AND BICEP TENDON REPAIR Right 04/16/2018   Procedure: ARTHROSCOPIC  BICEP TENOTOMY;  Surgeon: Thornton Park, MD;  Location: [facility] ORS;  Service: Orthopedics;  Laterality: Right;    Social History   Tobacco Use  . Smoking status: Never Smoker  . Smokeless tobacco: Never Used  Substance Use Topics  . Alcohol use: No     Current Outpatient Medications:  .  albuterol (PROAIR HFA) 108 (90 Base) MCG/ACT inhaler, Inhale 2 puffs into the lungs every 4 (four) hours as needed for wheezing or shortness of breath., Disp: 1 Inhaler, Rfl: 1 .  atorvastatin (LIPITOR) 80 MG tablet, Take 1 tablet (80 mg total) by mouth daily., Disp: 90 tablet, Rfl: 3 .  Cholecalciferol (VITAMIN D3 PO), Take 1 capsule by mouth daily., Disp: , Rfl:  .  Dulaglutide (TRULICITY) 1.5 ME/2.6ST SOPN, Inject 1.5 mg subcutaneously once a week, Disp: 4 pen, Rfl: 5 .  esomeprazole (NEXIUM) 20 MG capsule, Take 20 mg by mouth daily. , Disp: , Rfl:  .  ezetimibe (ZETIA) 10 MG tablet, Take 1 tablet (10 mg total) by mouth daily., Disp: 90 tablet, Rfl: 3 .  fluticasone (FLONASE) 50 MCG/ACT nasal spray, Place 2 sprays into both nostrils daily as needed. (Patient taking differently: Place 2 sprays into both nostrils daily. ), Disp: 16 g, Rfl: 11 .  glimepiride (AMARYL) 4 MG tablet, Take 1 tablet by mouth once daily with breakfast, Disp: 90 tablet, Rfl: 0 .  metFORMIN (GLUCOPHAGE) 1000 MG tablet, TAKE 1 TABLET BY MOUTH TWICE DAILY WITH MEALS, Disp: 180 tablet, Rfl: 0 .  polyethylene glycol powder (GLYCOLAX/MIRALAX) 17 GM/SCOOP powder, Take 17 g by mouth daily as needed for severe constipation (do not take more than 2 days in a row)., Disp: 255 g, Rfl: 1 .  Blood Glucose Monitoring Suppl (ACCU-CHEK AVIVA) device, USE AS DIRECTED, Disp: 100 each, Rfl: 0 .  glucose blood (ACCU-CHEK AVIVA PLUS) test strip, Check fingerstick blood sugar once a day; LON 99 months, Dx E11.22, Disp: 100 each, Rfl:  3 .  pioglitazone (ACTOS) 15 MG tablet, TAKE 1 TABLET BY MOUTH ONCE DAILY (STOP  AND  SEEK  MEDICAL  ATTENTION  IF  WEIGHT  GAIN,  LEG  SWELLING,  SHORTNESS  OF  BREATH) (Patient not taking: Reported on 11/16/2018), Disp: 30 tablet, Rfl: 5 .  silver sulfADIAZINE (SILVADENE) 1 % cream, Apply 1 application topically daily. (Patient not taking: Reported on 11/23/2018), Disp: 50 g, Rfl: 0  Allergies  Allergen Reactions  . Losartan Potassium Other (See Comments)    hyperkalemia    ROS    No other specific complaints in a complete review of systems (except as listed in HPI above).  Objective  Vitals:   11/23/18 0901  BP: 116/72  Pulse: 87  Resp: 12  Temp: 97.6 F (36.4 C)  TempSrc: Oral  SpO2: 99%  Weight: 186 lb 9.6 oz (84.6 kg)  Height:  5\' 11"  (1.803 m)     Body mass index is 26.03 kg/m.  Nursing Note and Vital Signs reviewed.  Physical Exam Vitals signs reviewed.  Constitutional:      Appearance: He is well-developed.  HENT:     Head: Normocephalic and atraumatic.  Neck:     Musculoskeletal: Normal range of motion and neck supple.     Vascular: No carotid bruit.  Cardiovascular:     Heart sounds: Normal heart sounds.  Pulmonary:     Effort: Pulmonary effort is normal.     Breath sounds: Normal breath sounds.  Abdominal:     General: Bowel sounds are normal.     Palpations: Abdomen is soft.     Tenderness: There is no abdominal tenderness.  Musculoskeletal: Normal range of motion.  Skin:    General: Skin is warm and dry.     Capillary Refill: Capillary refill takes less than 2 seconds.     Findings: Erythema (skin to left forearm is much improved, closed, blisters resolved) present.  Neurological:     Mental Status: He is alert and oriented to person, place, and time.     GCS: GCS eye subscore is 4. GCS verbal subscore is 5. GCS motor subscore is 6.     Sensory: No sensory deficit.  Psychiatric:        Speech: Speech normal.        Behavior: Behavior normal.         Thought Content: Thought content normal.        Judgment: Judgment normal.        No results found for this or any previous visit (from the past 48 hour(s)).  Assessment & Plan  1. Uncontrolled type 2 diabetes with stage 3 chronic kidney disease GFR 30-59 (HCC) Cutting down amaryl with goal of coming off of it due to hypoglycemia, consider SGLT-2 - Dulaglutide (TRULICITY) 1.5 CW/2.3JS SOPN; Inject 1.5 mg subcutaneously once a week  Dispense: 4 pen; Refill: 1 - metFORMIN (GLUCOPHAGE) 1000 MG tablet; Take 1 tablet (1,000 mg total) by mouth 2 (two) times daily with a meal.  Dispense: 180 tablet; Refill: 1 - Lancets (ACCU-CHEK MULTICLIX) lancets; Use as instructed  Dispense: 100 each; Refill: 12 - HgB A1c - glimepiride (AMARYL) 1 MG tablet; Take 1 tablet (1 mg total) by mouth daily with breakfast.  Dispense: 90 tablet; Refill: 1  2. Gastroesophageal reflux disease, esophagitis presence not specified Nexium, avoid triggers  3. Barrett's esophagus with dysplasia Nexium, diet   4. Osteopenia, unspecified location Vitamin D and calcium supplementation, weight bearing exercises  5. Mixed hyperlipidemia - ezetimibe (ZETIA) 10 MG tablet; Take 1 tablet (10 mg total) by mouth daily.  Dispense: 90 tablet; Refill: 1 - atorvastatin (LIPITOR) 80 MG tablet; Take 1 tablet (80 mg total) by mouth daily.  Dispense: 90 tablet; Refill: 1  6. Environmental allergies - fluticasone (FLONASE) 50 MCG/ACT nasal spray; Place 2 sprays into both nostrils daily as needed.  Dispense: 16 g; Refill: 11

## 2018-11-23 NOTE — Patient Instructions (Addendum)
-   Please pick up new presctiption of glimepride- we are cutting down your dose from 4mg  daily to 1mg  daily and will follow-up in 3 months. If you are still having low blood sugar readings (under 70) please call and tell us and we will want to follow-up sooner.   General recommendations for prevention of osteoporosis: avoid smoking and heavy alcohol consumption, do weight-bearing exercises regularly.  An optimal diet for bone health involves making sure you get enough protein and calories as well as plenty of calcium and vitamin D, which are essential in helping to maintain proper bone formation and density. Postmenopausal women should consume 1200 mg of calcium per day and 800 IU of Vitamin D per day (total of diet plus supplements). The main dietary sources of calcium include milk and other dairy products, such as cottage cheese, yogurt, and hard cheese, and green vegetables, such as kale and broccoli. Sunlight exposure is recommended if tolerated for 30 minutes 5 times a day.

## 2018-11-24 ENCOUNTER — Telehealth: Payer: Self-pay

## 2018-11-24 LAB — HEMOGLOBIN A1C
Hgb A1c MFr Bld: 6.3 % of total Hgb — ABNORMAL HIGH (ref <5.7)
Mean Plasma Glucose: 134 (calc)
eAG (mmol/L): 7.4 (calc)

## 2018-11-24 NOTE — Telephone Encounter (Signed)
Contacted patient to schedule his colonoscopy.  He states that he went to [facility] to have it because he thought we were too busy. Referral has been noted that patient has transferred to [facility].  Thanks [employer]

## 2018-11-26 ENCOUNTER — Other Ambulatory Visit: Payer: Self-pay | Admitting: Nurse Practitioner

## 2018-11-26 ENCOUNTER — Ambulatory Visit: Payer: Medicare Other | Admitting: Family Medicine

## 2018-11-26 DIAGNOSIS — IMO0002 Reserved for concepts with insufficient information to code with codable children: Secondary | ICD-10-CM

## 2018-11-26 DIAGNOSIS — E1122 Type 2 diabetes mellitus with diabetic chronic kidney disease: Secondary | ICD-10-CM

## 2018-12-08 ENCOUNTER — Other Ambulatory Visit: Payer: Self-pay | Admitting: Gastroenterology

## 2018-12-08 DIAGNOSIS — R1013 Epigastric pain: Secondary | ICD-10-CM

## 2018-12-09 ENCOUNTER — Other Ambulatory Visit: Payer: Self-pay | Admitting: Gastroenterology

## 2018-12-09 ENCOUNTER — Telehealth: Payer: Self-pay

## 2018-12-09 DIAGNOSIS — R1013 Epigastric pain: Secondary | ICD-10-CM

## 2018-12-09 NOTE — Telephone Encounter (Signed)
Attempted call without success.  If patient is having pancreatitis he needs to go to the ER. We can do a virtual visit with him and consider switching him to [place] or [place].

## 2018-12-09 NOTE — Telephone Encounter (Signed)
Copied from [place] [id number]. Topic: Quick Communication - Rx Refill/Question >> Dec 09, 2018  1:56 PM Erick Blinks wrote: Medication: Dulaglutide (TRULICITY) 1.5 GX/2.1JH SOPN [[id number]] -Christianne from [facility] called to report pt having pancreatitis. Requesting call back to discuss alternative Rx.  Please advise   [phone]  Christianne

## 2018-12-10 NOTE — Telephone Encounter (Signed)
Spoke with Paul Spears at [facility]. Advised her to notify patient to stop taking Trulicity. Notified patient as well. He stated he was notified about the pancreatitis due to his blood work at [facility]. He is having an MRI on Tuesday. Schedule a follow up with [facility] on Tuesday July 7th.

## 2018-12-16 ENCOUNTER — Other Ambulatory Visit: Payer: Self-pay

## 2018-12-16 ENCOUNTER — Ambulatory Visit
Admission: RE | Admit: 2018-12-16 | Discharge: 2018-12-16 | Disposition: A | Discharge status: Home / Self Care | Payer: Medicare Other | Source: Ambulatory Visit | Attending: Gastroenterology | Admitting: Gastroenterology

## 2018-12-16 DIAGNOSIS — R1013 Epigastric pain: Secondary | ICD-10-CM | POA: Diagnosis present

## 2018-12-16 MED ORDER — IOHEXOL 300 MG/ML  SOLN
100.0000 mL | Freq: Once | INTRAMUSCULAR | Status: AC | PRN
  Administered 2018-12-16: 11:00:00 100 mL via INTRAVENOUS

## 2018-12-17 ENCOUNTER — Ambulatory Visit
Admission: RE | Admit: 2018-12-17 | Discharge: 2018-12-17 | Disposition: A | Discharge status: Home / Self Care | Payer: Medicare Other | Source: Ambulatory Visit | Attending: Gastroenterology | Admitting: Gastroenterology

## 2018-12-17 DIAGNOSIS — R1013 Epigastric pain: Secondary | ICD-10-CM | POA: Diagnosis present

## 2018-12-17 MED ORDER — TECHNETIUM TC 99M SULFUR COLLOID
2.8000 | Freq: Once | INTRAVENOUS | Status: AC | PRN
  Administered 2018-12-17: 08:00:00 2.8 via INTRAVENOUS

## 2018-12-21 NOTE — Progress Notes (Signed)
Name: Paul Spears   MRN: [id number]    DOB: [birth year]   Date:12/22/2018       Progress Note  Subjective  Chief Complaint  Chief Complaint  Patient presents with  . Follow-up    HPI  Patient was seen at [facility] clinic GI and diagnosed with pancreatitis. Patient states he had routine blood work done at GI office and they found this and completed imaging that showed he had pancreatitis. Patient denies acute abdominal pain, nausea, vomiting. However reading through GI note patient did report some epigastric pain and intermittent nausea- lipase was elevated. Unable to see CT & MRI report through care everywhere.  He was taken off trulicity and presents to discuss diabetes follow-up His current regimen in metformin 1,000mg  BID he additionally stopped his amaryl.   Lab Results  Component Value Date   HGBA1C 6.3 (H) 11/23/2018  Patient has been checking fasting blood sugar 110-140. States appetite is normal. Denies low blood sugar readings int he last 2 weeks.   Thrombocytosis Patient follow up with Dr. Tasia Catchings- considered likely from iron deficiency anemia. - CBC last month shows it has resolved.   PHQ2/9: Depression screen [facility] 2/9 12/22/2018 11/23/2018 11/16/2018 09/15/2018 08/21/2018  Decreased Interest 0 0 0 0 0  Down, Depressed, Hopeless 0 0 0 0 0  PHQ - 2 Score 0 0 0 0 0  Altered sleeping 0 0 0 0 0  Tired, decreased energy 0 0 0 0 0  Change in appetite 0 0 0 0 0  Feeling bad or failure about yourself  0 0 0 0 0  Trouble concentrating 0 0 0 0 0  Moving slowly or fidgety/restless 0 0 0 0 0  Suicidal thoughts 0 0 0 0 0  PHQ-9 Score 0 0 0 0 0  Difficult doing work/chores Not difficult at all Not difficult at all Not difficult at all Not difficult at all Not difficult at all  Some recent data might be hidden     PHQ reviewed. Negative  Patient Active Problem List   Diagnosis Date Noted  . Thrombocytosis ([place]) 12/22/2018  . Iron deficiency anemia 11/11/2017  . DDD (degenerative disc  disease), cervical 12/07/2015  . Absent peripheral pulse 03/22/2015  . GERD with esophagitis 09/21/2014  . Barrett esophagus 09/21/2014  . Cervical osteoarthritis 09/21/2014  . Chronic kidney disease (CKD), stage III (moderate) ([place]) 09/21/2014  . Controlled type 2 diabetes mellitus with diabetic nephropathy ([place]) 09/21/2014  . Failure of erection 09/21/2014  . Microalbuminuria 09/21/2014  . Osteopenia 09/21/2014  . HTN (hypertension) 05/20/2014  . Hyperlipidemia 05/20/2014    Past Medical History:  Diagnosis Date  . Allergy   . Diabetes mellitus without complication ([place])   . GERD (gastroesophageal reflux disease)   . Hyperlipidemia   . Hypertension   . Iron deficiency anemia 11/11/2017  . Kidney disease, chronic, stage III (GFR 30-59 ml/min) (HCC)     Past Surgical History:  Procedure Laterality Date  . CARPAL TUNNEL RELEASE Right   . ESOPHAGOGASTRODUODENOSCOPY (EGD) WITH PROPOFOL N/A 10/22/2016   Procedure: ESOPHAGOGASTRODUODENOSCOPY (EGD) WITH PROPOFOL;  Surgeon: Lollie Sails, MD;  Location: [facility] ENDOSCOPY;  Service: Endoscopy;  Laterality: N/A;  . ESOPHAGOGASTRODUODENOSCOPY (EGD) WITH PROPOFOL N/A 07/14/2018   Procedure: ESOPHAGOGASTRODUODENOSCOPY (EGD) WITH PROPOFOL;  Surgeon: Lucilla Lame, MD;  Location: [facility] ENDOSCOPY;  Service: Endoscopy;  Laterality: N/A;  . FRACTURE SURGERY Left    arm  . SHOULDER ARTHROSCOPY WITH SUBACROMIAL DECOMPRESSION AND BICEP TENDON REPAIR Right 04/16/2018  Procedure: ARTHROSCOPIC  BICEP TENOTOMY;  Surgeon: Thornton Park, MD;  Location: [facility] ORS;  Service: Orthopedics;  Laterality: Right;    Social History   Tobacco Use  . Smoking status: Never Smoker  . Smokeless tobacco: Never Used  Substance Use Topics  . Alcohol use: No     Current Outpatient Medications:  .  albuterol (PROAIR HFA) 108 (90 Base) MCG/ACT inhaler, Inhale 2 puffs into the lungs every 4 (four) hours as needed for wheezing or shortness of breath., Disp: 1  Inhaler, Rfl: 1 .  atorvastatin (LIPITOR) 80 MG tablet, Take 1 tablet (80 mg total) by mouth daily., Disp: 90 tablet, Rfl: 1 .  Cholecalciferol (VITAMIN D3 PO), Take 1 capsule by mouth daily., Disp: , Rfl:  .  esomeprazole (NEXIUM) 20 MG capsule, Take 20 mg by mouth daily. , Disp: , Rfl:  .  ezetimibe (ZETIA) 10 MG tablet, Take 1 tablet (10 mg total) by mouth daily., Disp: 90 tablet, Rfl: 1 .  fluticasone (FLONASE) 50 MCG/ACT nasal spray, Place 2 sprays into both nostrils daily as needed., Disp: 16 g, Rfl: 11 .  metFORMIN (GLUCOPHAGE) 1000 MG tablet, Take 1 tablet (1,000 mg total) by mouth 2 (two) times daily with a meal., Disp: 180 tablet, Rfl: 1 .  glimepiride (AMARYL) 1 MG tablet, Take 1 tablet (1 mg total) by mouth daily with breakfast. (Patient not taking: Reported on 12/22/2018), Disp: 90 tablet, Rfl: 1 .  glucose blood (ACCU-CHEK AVIVA PLUS) test strip, Check fingerstick blood sugar once a day; LON 99 months, Dx E11.22, Disp: 100 each, Rfl: 3 .  Lancets (ACCU-CHEK MULTICLIX) lancets, USE AS DIRECTED, Disp: 102 each, Rfl: 12 .  polyethylene glycol powder (GLYCOLAX/MIRALAX) 17 GM/SCOOP powder, Take 17 g by mouth daily as needed for severe constipation (do not take more than 2 days in a row). (Patient not taking: Reported on 12/22/2018), Disp: 255 g, Rfl: 1  Allergies  Allergen Reactions  . Losartan Potassium Other (See Comments)    hyperkalemia    ROS    No other specific complaints in a complete review of systems (except as listed in HPI above).  Objective  Vitals:   12/22/18 0831  BP: 120/74  Pulse: 74  Resp: 14  Temp: (!) 96.9 F (36.1 C)  TempSrc: Temporal  SpO2: 98%  Weight: 186 lb 14.4 oz (84.8 kg)  Height: 5\' 11"  (1.803 m)     Body mass index is 26.07 kg/m.  Nursing Note and Vital Signs reviewed.  Physical Exam Vitals signs reviewed.  Constitutional:      Appearance: He is well-developed.  HENT:     Head: Normocephalic and atraumatic.  Neck:      Musculoskeletal: Normal range of motion and neck supple.     Vascular: No carotid bruit.  Cardiovascular:     Heart sounds: Normal heart sounds.  Pulmonary:     Effort: Pulmonary effort is normal.     Breath sounds: Normal breath sounds.  Abdominal:     General: Bowel sounds are normal. There is no distension.     Palpations: Abdomen is soft.     Tenderness: There is no abdominal tenderness. There is no right CVA tenderness, left CVA tenderness or rebound.  Musculoskeletal: Normal range of motion.  Skin:    General: Skin is warm and dry.     Capillary Refill: Capillary refill takes less than 2 seconds.  Neurological:     Mental Status: He is alert and oriented to person, place, and time.  GCS: GCS eye subscore is 4. GCS verbal subscore is 5. GCS motor subscore is 6.     Sensory: No sensory deficit.  Psychiatric:        Speech: Speech normal.        Behavior: Behavior normal.        Thought Content: Thought content normal.        Judgment: Judgment normal.        No results found for this or any previous visit (from the past 48 hour(s)).  Assessment & Plan  1. Controlled type 2 diabetes mellitus with diabetic nephropathy, without long-term current use of insulin (HCC) Per fasting blood sugar readings and last A1C patients diabetes is well controlled, will continue to monitor for goal of fasting sugars under 150 due to advanced age and prevention of hypoglycemia, we will not restart the glimperide.   2. Thrombocytosis ([place]) resolved  3. Drug-induced acute pancreatitis, unspecified complication status Pain resolved, stopped trulicity, follow-up with GI.    -Red flags and when to present for emergency care or RTC including fever >101.68F, chest pain, shortness of breath, new/worsening/un-resolving symptoms,reviewed with patient at time of visit. Follow up and care instructions discussed and provided in AVS. Face-to-face time with patient was more than 25 minutes, >50% time  spent counseling and coordination of care

## 2018-12-22 ENCOUNTER — Other Ambulatory Visit: Payer: Self-pay

## 2018-12-22 ENCOUNTER — Encounter: Payer: Self-pay | Admitting: Nurse Practitioner

## 2018-12-22 ENCOUNTER — Ambulatory Visit (INDEPENDENT_AMBULATORY_CARE_PROVIDER_SITE_OTHER): Payer: Medicare Other | Admitting: Nurse Practitioner

## 2018-12-22 VITALS — BP 120/74 | HR 74 | Temp 96.9°F | Resp 14 | Ht 71.0 in | Wt 186.9 lb

## 2018-12-22 DIAGNOSIS — D75839 Thrombocytosis, unspecified: Secondary | ICD-10-CM

## 2018-12-22 DIAGNOSIS — K853 Drug induced acute pancreatitis without necrosis or infection: Secondary | ICD-10-CM

## 2018-12-22 DIAGNOSIS — E1121 Type 2 diabetes mellitus with diabetic nephropathy: Secondary | ICD-10-CM | POA: Diagnosis not present

## 2018-12-22 DIAGNOSIS — D473 Essential (hemorrhagic) thrombocythemia: Secondary | ICD-10-CM | POA: Diagnosis not present

## 2018-12-22 NOTE — Patient Instructions (Addendum)
Please stop and DO NOT RESTART your trulicity and glimepiride. Take only metformin 1000mg  twice a day for your diabetes.  Our goal for your fasting blood sugars is to be under 150. If you have consistently higher readings or if you ever have a low reading- blood sugar under 80, please let us know.

## 2019-01-20 ENCOUNTER — Telehealth: Payer: Self-pay | Admitting: Nurse Practitioner

## 2019-01-20 NOTE — Telephone Encounter (Signed)
Let's just monitor for about 1 week - if fasting blood sugars (before eating or drinking anything) remain at or above 160, please schedule a follow up appointment in about a week.   Please encourage him to really continue to work on his diet and exercise - avoiding simple carbohydrates (white bread/rice/potatoes/sweets) and anything with sugar.  He was doing very well, sometimes when you come off of glimeperide we see a slight spike in the blood sugars temporarily, then things settle down.  If he has increased hunger, thirst, or urination, please let us know.

## 2019-01-20 NOTE — Telephone Encounter (Signed)
Pt said that his blood sugar is running high, this morning it was 200 after he had a cup of coffee w 1 pack of sweet n low. Has noticed it has been running 130 to 150 till this morning. Wants to know if he needs to be placed back on the medication that they stopped ( trulicity and two other pills). Please advise. Had no avai for an appt this week

## 2019-01-20 NOTE — Telephone Encounter (Signed)
Pt notified, went ahead and scheduled an appt.  He forgot but states it was probably high due to eating nuts with syrup on them before bed.

## 2019-02-04 ENCOUNTER — Other Ambulatory Visit: Payer: Self-pay

## 2019-02-04 ENCOUNTER — Other Ambulatory Visit
Admission: RE | Admit: 2019-02-04 | Discharge: 2019-02-04 | Disposition: A | Discharge status: Home / Self Care | Payer: Medicare Other | Source: Ambulatory Visit | Attending: Gastroenterology | Admitting: Gastroenterology

## 2019-02-04 DIAGNOSIS — Z01812 Encounter for preprocedural laboratory examination: Secondary | ICD-10-CM | POA: Insufficient documentation

## 2019-02-04 DIAGNOSIS — Z20828 Contact with and (suspected) exposure to other viral communicable diseases: Secondary | ICD-10-CM | POA: Insufficient documentation

## 2019-02-04 LAB — SARS CORONAVIRUS 2 (TAT 6-24 HRS): SARS Coronavirus 2: NEGATIVE

## 2019-02-08 ENCOUNTER — Ambulatory Visit
Admission: RE | Admit: 2019-02-08 | Discharge: 2019-02-08 | Disposition: A | Discharge status: Home / Self Care | Payer: Medicare Other | Attending: Gastroenterology | Admitting: Gastroenterology

## 2019-02-08 ENCOUNTER — Encounter: Payer: Self-pay | Admitting: *Deleted

## 2019-02-08 ENCOUNTER — Ambulatory Visit: Payer: Medicare Other | Admitting: Certified Registered Nurse Anesthetist

## 2019-02-08 ENCOUNTER — Other Ambulatory Visit: Payer: Self-pay

## 2019-02-08 ENCOUNTER — Encounter
Admission: RE | Disposition: A | Discharge status: Home / Self Care | Payer: Self-pay | Source: Home / Self Care | Attending: Gastroenterology

## 2019-02-08 DIAGNOSIS — N183 Chronic kidney disease, stage 3 (moderate): Secondary | ICD-10-CM | POA: Diagnosis not present

## 2019-02-08 DIAGNOSIS — Z7951 Long term (current) use of inhaled steroids: Secondary | ICD-10-CM | POA: Diagnosis not present

## 2019-02-08 DIAGNOSIS — Z79899 Other long term (current) drug therapy: Secondary | ICD-10-CM | POA: Diagnosis not present

## 2019-02-08 DIAGNOSIS — I129 Hypertensive chronic kidney disease with stage 1 through stage 4 chronic kidney disease, or unspecified chronic kidney disease: Secondary | ICD-10-CM | POA: Insufficient documentation

## 2019-02-08 DIAGNOSIS — D631 Anemia in chronic kidney disease: Secondary | ICD-10-CM | POA: Diagnosis not present

## 2019-02-08 DIAGNOSIS — K5900 Constipation, unspecified: Secondary | ICD-10-CM | POA: Insufficient documentation

## 2019-02-08 DIAGNOSIS — R194 Change in bowel habit: Secondary | ICD-10-CM | POA: Diagnosis present

## 2019-02-08 DIAGNOSIS — K621 Rectal polyp: Secondary | ICD-10-CM | POA: Diagnosis not present

## 2019-02-08 DIAGNOSIS — E1122 Type 2 diabetes mellitus with diabetic chronic kidney disease: Secondary | ICD-10-CM | POA: Insufficient documentation

## 2019-02-08 DIAGNOSIS — K219 Gastro-esophageal reflux disease without esophagitis: Secondary | ICD-10-CM | POA: Insufficient documentation

## 2019-02-08 DIAGNOSIS — K2270 Barrett's esophagus without dysplasia: Secondary | ICD-10-CM | POA: Diagnosis not present

## 2019-02-08 DIAGNOSIS — D509 Iron deficiency anemia, unspecified: Secondary | ICD-10-CM | POA: Insufficient documentation

## 2019-02-08 DIAGNOSIS — K641 Second degree hemorrhoids: Secondary | ICD-10-CM | POA: Diagnosis not present

## 2019-02-08 DIAGNOSIS — E785 Hyperlipidemia, unspecified: Secondary | ICD-10-CM | POA: Insufficient documentation

## 2019-02-08 DIAGNOSIS — Z7984 Long term (current) use of oral hypoglycemic drugs: Secondary | ICD-10-CM | POA: Diagnosis not present

## 2019-02-08 DIAGNOSIS — K644 Residual hemorrhoidal skin tags: Secondary | ICD-10-CM | POA: Insufficient documentation

## 2019-02-08 HISTORY — DX: Esophagitis, unspecified without bleeding: K20.90

## 2019-02-08 HISTORY — PX: COLONOSCOPY WITH PROPOFOL: SHX5780

## 2019-02-08 HISTORY — DX: Barrett's esophagus without dysplasia: K22.70

## 2019-02-08 LAB — GLUCOSE, CAPILLARY: Glucose-Capillary: 164 mg/dL — ABNORMAL HIGH (ref 70–99)

## 2019-02-08 SURGERY — COLONOSCOPY WITH PROPOFOL
Anesthesia: General

## 2019-02-08 MED ORDER — PROPOFOL 500 MG/50ML IV EMUL
INTRAVENOUS | Status: DC | PRN
  Administered 2019-02-08: 120 ug/kg/min via INTRAVENOUS

## 2019-02-08 MED ORDER — SODIUM CHLORIDE 0.9 % IV SOLN
INTRAVENOUS | Status: DC
  Administered 2019-02-08: 13:00:00 1000 mL via INTRAVENOUS

## 2019-02-08 MED ORDER — PROPOFOL 500 MG/50ML IV EMUL
INTRAVENOUS | Status: AC
  Filled 2019-02-08: qty 50

## 2019-02-08 MED ORDER — PROPOFOL 10 MG/ML IV BOLUS
INTRAVENOUS | Status: DC | PRN
  Administered 2019-02-08: 40 mg via INTRAVENOUS

## 2019-02-08 MED ORDER — LIDOCAINE HCL (CARDIAC) PF 100 MG/5ML IV SOSY
PREFILLED_SYRINGE | INTRAVENOUS | Status: DC | PRN
  Administered 2019-02-08: 50 mg via INTRAVENOUS

## 2019-02-08 MED ORDER — LIDOCAINE HCL (PF) 2 % IJ SOLN
INTRAMUSCULAR | Status: AC
  Filled 2019-02-08: qty 10

## 2019-02-08 NOTE — Transfer of Care (Signed)
Immediate Anesthesia Transfer of Care Note  Patient: Paul Spears  Procedure(s) Performed: COLONOSCOPY WITH PROPOFOL (N/A )  Patient Location: PACU  Anesthesia Type:General  Level of Consciousness: awake, alert  and oriented  Airway & Oxygen Therapy: Patient Spontanous Breathing and Patient connected to nasal cannula oxygen  Post-op Assessment: Report given to RN and Post -op Vital signs reviewed and stable  Post vital signs: Reviewed and stable  Last Vitals:  Vitals Value Taken Time  BP    Temp    Pulse 86 02/08/19 1335  Resp 13 02/08/19 1335  SpO2 96 % 02/08/19 1335  Vitals shown include unvalidated device data.  Last Pain:  Vitals:   02/08/19 1231  TempSrc: Tympanic  PainSc: 0-No pain         Complications: No apparent anesthesia complications

## 2019-02-08 NOTE — H&P (Signed)
Outpatient short stay form Pre-procedure 02/08/2019 12:59 PM Paul Sails MD  Primary Physician: Dr. Suezanne Cheshire  Reason for visit: Colonoscopy  History of present illness: Patient is a 73 year old male presenting today for colonoscopy.  He had some issues with change in bowel habits with constipation well has some epigastric pain.  There is a recent history of iron deficiency anemia.  It is of note that he had been placed on some Trulicity for his diabetes and he had a related pancreatitis issue.  Is been off of that medication now and his abdominal pain has resolved.  Still some mild constipation.  His prep well.  He takes no aspirin or blood thinning agent.    Current Facility-Administered Medications:  .  0.9 %  sodium chloride infusion, , Intravenous, Continuous, Paul Sails, MD, Last Rate: 20 mL/hr at 02/08/19 1248, 1,000 mL at 02/08/19 1248  Medications Prior to Admission  Medication Sig Dispense Refill Last Dose  . pioglitazone (ACTOS) 15 MG tablet Take 15 mg by mouth daily.     Marland Kitchen albuterol (PROAIR HFA) 108 (90 Base) MCG/ACT inhaler Inhale 2 puffs into the lungs every 4 (four) hours as needed for wheezing or shortness of breath. 1 Inhaler 1   . atorvastatin (LIPITOR) 80 MG tablet Take 1 tablet (80 mg total) by mouth daily. 90 tablet 1   . Cholecalciferol (VITAMIN D3 PO) Take 1 capsule by mouth daily.     Marland Kitchen esomeprazole (NEXIUM) 40 MG capsule Take 40 mg by mouth daily.      Marland Kitchen ezetimibe (ZETIA) 10 MG tablet Take 1 tablet (10 mg total) by mouth daily. 90 tablet 1   . fluticasone (FLONASE) 50 MCG/ACT nasal spray Place 2 sprays into both nostrils daily as needed. 16 g 11   . glucose blood (ACCU-CHEK AVIVA PLUS) test strip Check fingerstick blood sugar once a day; LON 99 months, Dx E11.22 100 each 3   . Lancets (ACCU-CHEK MULTICLIX) lancets USE AS DIRECTED 102 each 12   . metFORMIN (GLUCOPHAGE) 1000 MG tablet Take 1 tablet (1,000 mg total) by mouth 2 (two) times daily  with a meal. 180 tablet 1      Allergies  Allergen Reactions  . Losartan Potassium Other (See Comments)    hyperkalemia     Past Medical History:  Diagnosis Date  . Allergy   . Barrett's esophagus   . Diabetes mellitus without complication ([place])   . Esophagitis    LA grade C  . GERD (gastroesophageal reflux disease)   . Hyperlipidemia   . Hypertension   . Iron deficiency anemia 11/11/2017  . Kidney disease, chronic, stage III (GFR 30-59 ml/min) (HCC)     Review of systems:      Physical Exam    Heart and lungs: Good rate and rhythm without rub or gallop lungs are bilaterally clear    HEENT: Normocephalic atraumatic eyes are anicteric    Other:    Pertinant exam for procedure: Soft nontender nondistended bowel sounds positive normoactive    Planned proceedures: Colonoscopy and indicated procedures. I have discussed the risks benefits and complications of procedures to include not limited to bleeding, infection, perforation and the risk of sedation and the patient wishes to proceed.    Paul Sails, MD Gastroenterology 02/08/2019  12:59 PM

## 2019-02-08 NOTE — Op Note (Signed)
[facility] Patient Name: Paul Spears Procedure Date: 02/08/2019 12:57 PM MRN: [id number] Account #: [id number] Date of Birth: 11/13/1945 Admit Type: Outpatient Age: 73 Room: [facility] ENDO ROOM 3 Gender: Male Note Status: Finalized Procedure:            Colonoscopy Indications:          Change in bowel habits Providers:            Lollie Sails, MD Medicines:            Monitored Anesthesia Care Complications:        No immediate complications. Procedure:            Pre-Anesthesia Assessment:                       - ASA Grade Assessment: II - A patient with mild                        systemic disease.                       After obtaining informed consent, the colonoscope was                        passed under direct vision. Throughout the procedure,                        the patient's blood pressure, pulse, and oxygen                        saturations were monitored continuously. The                        Colonoscope was introduced through the anus and                        advanced to the the cecum, identified by appendiceal                        orifice and ileocecal valve. The colonoscopy was                        performed without difficulty. The patient tolerated the                        procedure well. The quality of the bowel preparation                        was good. Findings:      A 10 mm polyp was found in the rectum. The polyp was sessile. The polyp       was removed with a cold snare. Resection and retrieval were complete.      Non-bleeding external and internal hemorrhoids were found during       retroflexion, during digital exam and during anoscopy. The hemorrhoids       were small and Grade II (internal hemorrhoids that prolapse but reduce       spontaneously).      The exam was otherwise without abnormality.      The retroflexed view of the distal rectum and anal verge was normal and       showed no anal  or rectal  abnormalities otherwise.      Skin tags were found on perianal exam. Impression:           - One 10 mm polyp in the rectum, removed with a cold                        snare. Resected and retrieved.                       - Non-bleeding external and internal hemorrhoids.                       - The examination was otherwise normal.                       - The distal rectum and anal verge are normal on                        retroflexion view.                       - Perianal skin tags found on perianal exam. Recommendation:       - Full liquid diet today, then advance as tolerated to                        soft diet for 1 day. Procedure Code(s):    --- Professional ---                       [phone], Colonoscopy, flexible; with removal of tumor(s),                        polyp(s), or other lesion(s) by snare technique Diagnosis Code(s):    --- Professional ---                       K62.1, Rectal polyp                       K64.1, Second degree hemorrhoids                       K64.4, Residual hemorrhoidal skin tags                       R19.4, Change in bowel habit CPT copyright 2019 American Medical Association. All rights reserved. The codes documented in this report are preliminary and upon coder review may  be revised to meet current compliance requirements. Lollie Sails, MD 02/08/2019 1:34:31 PM This report has been signed electronically. Number of Addenda: 0 Note Initiated On: 02/08/2019 12:57 PM Scope Withdrawal Time: 0 hours 14 minutes 55 seconds  Total Procedure Duration: 0 hours 22 minutes 0 seconds       [facility]

## 2019-02-08 NOTE — Anesthesia Preprocedure Evaluation (Signed)
Anesthesia Evaluation  Patient identified by MRN, date of birth, ID band Patient awake    Reviewed: Allergy & Precautions, H&P , NPO status , Patient's Chart, lab work & pertinent test results, reviewed documented beta blocker date and time   Airway Mallampati: II   Neck ROM: full    Dental  (+) Poor Dentition   Pulmonary neg pulmonary ROS,    Pulmonary exam normal        Cardiovascular Exercise Tolerance: Good hypertension, On Medications negative cardio ROS Normal cardiovascular exam Rhythm:regular Rate:Normal     Neuro/Psych negative neurological ROS  negative psych ROS   GI/Hepatic Neg liver ROS, GERD  Medicated,  Endo/Other  negative endocrine ROSdiabetes, Well Controlled, Type 2, Oral Hypoglycemic Agents  Renal/GU CRFRenal disease  negative genitourinary   Musculoskeletal   Abdominal   Peds  Hematology  (+) Blood dyscrasia, anemia ,   Anesthesia Other Findings Past Medical History: No date: Allergy No date: Barrett's esophagus No date: Diabetes mellitus without complication (HCC) No date: Esophagitis     Comment:  LA grade C No date: GERD (gastroesophageal reflux disease) No date: Hyperlipidemia No date: Hypertension 11/11/2017: Iron deficiency anemia No date: Kidney disease, chronic, stage III (GFR 30-59 ml/min) ([place]) Past Surgical History: No date: CARPAL TUNNEL RELEASE; Right 10/26/2012: COLONOSCOPY     Comment:  repeat in 10 years MUS 10/22/2016: ESOPHAGOGASTRODUODENOSCOPY (EGD) WITH PROPOFOL; N/A     Comment:  Procedure: ESOPHAGOGASTRODUODENOSCOPY (EGD) WITH               PROPOFOL;  Surgeon: Lollie Sails, MD;  Location:               [facility] ENDOSCOPY;  Service: Endoscopy;  Laterality: N/A; 07/14/2018: ESOPHAGOGASTRODUODENOSCOPY (EGD) WITH PROPOFOL; N/A     Comment:  Procedure: ESOPHAGOGASTRODUODENOSCOPY (EGD) WITH               PROPOFOL;  Surgeon: Lucilla Lame, MD;  Location: [facility]     ENDOSCOPY;  Service: Endoscopy;  Laterality: N/A; No date: FRACTURE SURGERY; Left     Comment:  arm 04/16/2018: SHOULDER ARTHROSCOPY WITH SUBACROMIAL DECOMPRESSION AND  BICEP TENDON REPAIR; Right     Comment:  Procedure: ARTHROSCOPIC  BICEP TENOTOMY;  Surgeon:               Thornton Park, MD;  Location: [facility] ORS;  Service:               Orthopedics;  Laterality: Right; 6/15 ue to repeat in 1 year: UPPER ESOPHAGEAL ENDOSCOPIC ULTRASOUND  (EUS) BMI    Body Mass Index: 25.52 kg/m     Reproductive/Obstetrics negative OB ROS                             Anesthesia Physical Anesthesia Plan  ASA: III  Anesthesia Plan: General   Post-op Pain Management:    Induction:   PONV Risk Score and Plan:   Airway Management Planned:   Additional Equipment:   Intra-op Plan:   Post-operative Plan:   Informed Consent: I have reviewed the patients History and Physical, chart, labs and discussed the procedure including the risks, benefits and alternatives for the proposed anesthesia with the patient or authorized representative who has indicated his/her understanding and acceptance.     Dental Advisory Given  Plan Discussed with: CRNA  Anesthesia Plan Comments:         Anesthesia Quick Evaluation

## 2019-02-08 NOTE — Anesthesia Post-op Follow-up Note (Signed)
Anesthesia QCDR form completed.        

## 2019-02-09 ENCOUNTER — Encounter: Payer: Self-pay | Admitting: Gastroenterology

## 2019-02-09 LAB — SURGICAL PATHOLOGY

## 2019-02-15 NOTE — Anesthesia Postprocedure Evaluation (Signed)
Anesthesia Post Note  Patient: Paul Spears  Procedure(s) Performed: COLONOSCOPY WITH PROPOFOL (N/A )  Patient location during evaluation: PACU Anesthesia Type: General Level of consciousness: awake and alert Pain management: pain level controlled Vital Signs Assessment: post-procedure vital signs reviewed and stable Respiratory status: spontaneous breathing, nonlabored ventilation, respiratory function stable and patient connected to nasal cannula oxygen Cardiovascular status: blood pressure returned to baseline and stable Postop Assessment: no apparent nausea or vomiting Anesthetic complications: no     Last Vitals:  Vitals:   02/08/19 1346 02/08/19 1356  BP: (!) 85/62 114/72  Pulse: 79 82  Resp: 12 15  Temp:    SpO2: 96% 99%    Last Pain:  Vitals:   02/09/19 0718  TempSrc:   PainSc: 0-No pain                 Molli Barrows

## 2019-02-23 ENCOUNTER — Ambulatory Visit: Payer: Medicare Other | Admitting: Family Medicine

## 2019-02-25 ENCOUNTER — Ambulatory Visit (INDEPENDENT_AMBULATORY_CARE_PROVIDER_SITE_OTHER): Payer: Medicare Other | Admitting: Family Medicine

## 2019-02-25 ENCOUNTER — Other Ambulatory Visit: Payer: Self-pay

## 2019-02-25 ENCOUNTER — Encounter: Payer: Self-pay | Admitting: Family Medicine

## 2019-02-25 VITALS — BP 142/82 | HR 83 | Temp 98.1°F | Resp 14 | Ht 71.0 in | Wt 180.0 lb

## 2019-02-25 DIAGNOSIS — E1121 Type 2 diabetes mellitus with diabetic nephropathy: Secondary | ICD-10-CM | POA: Diagnosis not present

## 2019-02-25 DIAGNOSIS — I10 Essential (primary) hypertension: Secondary | ICD-10-CM | POA: Diagnosis not present

## 2019-02-25 DIAGNOSIS — Z5181 Encounter for therapeutic drug level monitoring: Secondary | ICD-10-CM

## 2019-02-25 DIAGNOSIS — E782 Mixed hyperlipidemia: Secondary | ICD-10-CM

## 2019-02-25 DIAGNOSIS — N183 Chronic kidney disease, stage 3 unspecified: Secondary | ICD-10-CM

## 2019-02-25 DIAGNOSIS — R809 Proteinuria, unspecified: Secondary | ICD-10-CM

## 2019-02-25 DIAGNOSIS — K2100 Gastro-esophageal reflux disease with esophagitis, without bleeding: Secondary | ICD-10-CM

## 2019-02-25 DIAGNOSIS — Z23 Encounter for immunization: Secondary | ICD-10-CM

## 2019-02-25 LAB — POCT GLYCOSYLATED HEMOGLOBIN (HGB A1C): Hemoglobin A1C: 7.4 % — AB (ref 4.0–5.6)

## 2019-02-25 NOTE — Patient Instructions (Signed)
I will share labs with your nephrologist  I will need to review your chart and kidney doctor notes to see what blood pressure medicines we can put you on that will work for BP for Diabetes and for protecting your kidneys  We need to get you on another diabetes medicine - I will have to review your chart as well for the best medicine for that  Please continue to check you morning blood sugars and bring it with you to your next appt.  Would like to see you in about 1 month to recheck your blood sugar trends and tolerance of new medications.

## 2019-02-25 NOTE — Assessment & Plan Note (Signed)
A1C worse than past labs after recent D/C of multiple meds, but overall near goal for age Will need to add a med and see if I can adjust meds to what he will be able to be compliant with.

## 2019-02-25 NOTE — Assessment & Plan Note (Signed)
Pt mostly ompliant with meds, no SE, no myalgias, fatigue or jaundice Due for FLP and recheck CMP

## 2019-02-25 NOTE — Assessment & Plan Note (Signed)
BP elevated today, goal ~130/80 Will need to research chart and review nephrology notes to see hx and plan for renal protection With BP reading today, DM, microalbuminuria and hx of CKD will need something renal protective

## 2019-02-25 NOTE — Progress Notes (Signed)
Name: Paul Spears   MRN: [id number]    DOB: 1945/11/22   Date:02/25/2019       Progress Note  Chief Complaint  Patient presents with  . Follow-up  . Diabetes  . Hyperlipidemia  . Arthritis    pain     Subjective:   Paul Spears is a 73 y.o. male, presents to clinic for routine follow up on the conditions listed above.  DM: Metformin 1000 mg BID Morning fasting blood sugars 170-200 Recently stopped trulicity, amaryl and actos and since then sugars have been higher Now only on metformin and often forgets to take in the morning and sometimes forgets to take at night He does not have his meter with him No polyuria, polydipsia, polyphagia, peripheral neuropathy, visual disturbances, weight loss  Regarding recent med changes - pt is unsure of any SE related to meds and reasons why they were D/Cd other than trulicity caused stomach upset  HTN: Diet controlled, not on ACE/ARB (allergy to arb) BP well controlled today BP Readings from Last 3 Encounters:  02/25/19 (!) 142/82  02/08/19 114/72  12/22/18 120/74  He denies chest pain, shortness of breath, palpitations, lower extremity edema, near syncope, orthopnea, PND, headaches or visual disturbances  HLD: Lipitor 80 mg and Zetia Intermittent compliance to meds No special diet to avoid fried/fatty foods No SE from meds, denies myalgias   CKD stage 3: Had protein in urine and does see nephrology - he cannot recall the Dr's name right now, but he's seeing him in the next month. Not on ARB - previously on losartan HCTZ, stopped due to hyperkalemia, and also on lisinopril in the past, but I cannot discern by it was discontinued and with reviewing chart briefly this morning, could not find the last nephrology visit note to see history of management.  Last labs had GFR > 60, normal BUN and sCr   Dietary efforts for chronic conditions:  Diet is cutting back on foods Exercise/lifestyle efforts for chronic conditions:  No other  efforts - says he could cut back on his salt in diet  GERD - still taking nexium every day in the morning, stomach sx and GERD are well controlled Just had endoscopy done Jan 2020 and about 2-3 weeks ago had colonoscopy done   Patient Active Problem List   Diagnosis Date Noted  . Thrombocytosis ([place]) 12/22/2018  . Iron deficiency anemia 11/11/2017  . DDD (degenerative disc disease), cervical 12/07/2015  . Absent peripheral pulse 03/22/2015  . GERD with esophagitis 09/21/2014  . Barrett esophagus 09/21/2014  . Cervical osteoarthritis 09/21/2014  . Chronic kidney disease (CKD), stage III (moderate) ([place]) 09/21/2014  . Controlled type 2 diabetes mellitus with diabetic nephropathy ([place]) 09/21/2014  . Failure of erection 09/21/2014  . Microalbuminuria 09/21/2014  . Osteopenia 09/21/2014  . HTN (hypertension) 05/20/2014  . Hyperlipidemia 05/20/2014    Past Surgical History:  Procedure Laterality Date  . CARPAL TUNNEL RELEASE Right   . COLONOSCOPY  10/26/2012   repeat in 10 years MUS  . COLONOSCOPY WITH PROPOFOL N/A 02/08/2019   Procedure: COLONOSCOPY WITH PROPOFOL;  Surgeon: Lollie Sails, MD;  Location: [facility] ENDOSCOPY;  Service: Endoscopy;  Laterality: N/A;  . ESOPHAGOGASTRODUODENOSCOPY (EGD) WITH PROPOFOL N/A 10/22/2016   Procedure: ESOPHAGOGASTRODUODENOSCOPY (EGD) WITH PROPOFOL;  Surgeon: Lollie Sails, MD;  Location: [facility] ENDOSCOPY;  Service: Endoscopy;  Laterality: N/A;  . ESOPHAGOGASTRODUODENOSCOPY (EGD) WITH PROPOFOL N/A 07/14/2018   Procedure: ESOPHAGOGASTRODUODENOSCOPY (EGD) WITH PROPOFOL;  Surgeon: Lucilla Lame, MD;  Location: [facility] ENDOSCOPY;  Service: Endoscopy;  Laterality: N/A;  . FRACTURE SURGERY Left    arm  . SHOULDER ARTHROSCOPY WITH SUBACROMIAL DECOMPRESSION AND BICEP TENDON REPAIR Right 04/16/2018   Procedure: ARTHROSCOPIC  BICEP TENOTOMY;  Surgeon: Thornton Park, MD;  Location: [facility] ORS;  Service: Orthopedics;  Laterality: Right;  . UPPER ESOPHAGEAL  ENDOSCOPIC ULTRASOUND (EUS)  6/15 ue to repeat in 1 year    Family History  Problem Relation Age of Onset  . Hypertension Father   . Heart attack Father   . Heart disease Sister   . Hypertension Sister     Social History   Socioeconomic History  . Marital status: Divorced    Spouse name: Not on file  . Number of children: 3  . Years of education: Not on file  . Highest education level: GED or equivalent  Occupational History  . Not on file  Social Needs  . Financial resource strain: Not hard at all  . Food insecurity    Worry: Never true    Inability: Never true  . Transportation needs    Medical: No    Non-medical: No  Tobacco Use  . Smoking status: Never Smoker  . Smokeless tobacco: Never Used  Substance and Sexual Activity  . Alcohol use: No  . Drug use: No  . Sexual activity: Not Currently  Lifestyle  . Physical activity    Days per week: 0 days    Minutes per session: 0 min  . Stress: Not at all  Relationships  . Social connections    Talks on phone: More than three times a week    Gets together: More than three times a week    Attends religious service: More than 4 times per year    Active member of club or organization: Yes    Attends meetings of clubs or organizations: 1 to 4 times per year    Relationship status: Divorced  . Intimate partner violence    Fear of current or ex partner: No    Emotionally abused: No    Physically abused: No    Forced sexual activity: No  Other Topics Concern  . Not on file  Social History Narrative  . Not on file     Current Outpatient Medications:  .  albuterol (PROAIR HFA) 108 (90 Base) MCG/ACT inhaler, Inhale 2 puffs into the lungs every 4 (four) hours as needed for wheezing or shortness of breath., Disp: 1 Inhaler, Rfl: 1 .  atorvastatin (LIPITOR) 80 MG tablet, Take 1 tablet (80 mg total) by mouth daily., Disp: 90 tablet, Rfl: 1 .  Cholecalciferol (VITAMIN D3 PO), Take 1 capsule by mouth daily., Disp: , Rfl:   .  esomeprazole (NEXIUM) 40 MG capsule, Take 40 mg by mouth daily. , Disp: , Rfl:  .  ezetimibe (ZETIA) 10 MG tablet, Take 1 tablet (10 mg total) by mouth daily., Disp: 90 tablet, Rfl: 1 .  fluticasone (FLONASE) 50 MCG/ACT nasal spray, Place 2 sprays into both nostrils daily as needed., Disp: 16 g, Rfl: 11 .  metFORMIN (GLUCOPHAGE) 1000 MG tablet, Take 1 tablet (1,000 mg total) by mouth 2 (two) times daily with a meal., Disp: 180 tablet, Rfl: 1 .  glucose blood (ACCU-CHEK AVIVA PLUS) test strip, Check fingerstick blood sugar once a day; LON 99 months, Dx E11.22, Disp: 100 each, Rfl: 3 .  Lancets (ACCU-CHEK MULTICLIX) lancets, USE AS DIRECTED, Disp: 102 each, Rfl: 12 .  pioglitazone (ACTOS) 15 MG tablet, Take 15  mg by mouth daily., Disp: , Rfl:   Allergies  Allergen Reactions  . Losartan Potassium Other (See Comments)    hyperkalemia    I personally reviewed active problem list, medication list, allergies, family history, social history, health maintenance, notes from last encounter, lab results, endoscopy procedure notes with the patient/caregiver today.  Review of Systems  Constitutional: Negative.   HENT: Negative.   Eyes: Negative.   Respiratory: Negative.   Cardiovascular: Negative.   Gastrointestinal: Negative.   Endocrine: Negative.   Genitourinary: Negative.   Musculoskeletal: Negative.   Skin: Negative.   Allergic/Immunologic: Negative.   Neurological: Negative.   Hematological: Negative.   Psychiatric/Behavioral: Negative.   All other systems reviewed and are negative.    Objective:    Vitals:   02/25/19 0902  BP: (!) 142/82  Pulse: 83  Resp: 14  Temp: 98.1 F (36.7 C)  SpO2: 99%  Weight: 180 lb (81.6 kg)  Height: 5\' 11"  (1.803 m)    Body mass index is 25.1 kg/m.  Physical Exam Vitals signs and nursing note reviewed.  Constitutional:      General: He is not in acute distress.    Appearance: Normal appearance. He is well-developed. He is not  ill-appearing, toxic-appearing or diaphoretic.     Comments: Well appearing elderly male, looks staged age, slightly pale complexion  HENT:     Head: Normocephalic and atraumatic.     Jaw: No trismus.     Right Ear: Tympanic membrane, ear canal and external ear normal.     Left Ear: Tympanic membrane, ear canal and external ear normal.     Nose: Nose normal. No mucosal edema or rhinorrhea.     Right Sinus: No maxillary sinus tenderness or frontal sinus tenderness.     Left Sinus: No maxillary sinus tenderness or frontal sinus tenderness.     Mouth/Throat:     Mouth: Mucous membranes are dry.     Pharynx: Oropharynx is clear. Uvula midline. No oropharyngeal exudate, posterior oropharyngeal erythema or uvula swelling.  Eyes:     General: Lids are normal. No scleral icterus.    Conjunctiva/sclera: Conjunctivae normal.     Pupils: Pupils are equal, round, and reactive to light.  Neck:     Musculoskeletal: Normal range of motion and neck supple.     Trachea: Trachea and phonation normal. No tracheal deviation.  Cardiovascular:     Rate and Rhythm: Normal rate and regular rhythm.     Pulses: Normal pulses.          Radial pulses are 2+ on the right side and 2+ on the left side.       Posterior tibial pulses are 2+ on the right side and 2+ on the left side.     Heart sounds: Normal heart sounds. No murmur. No friction rub. No gallop.   Pulmonary:     Effort: Pulmonary effort is normal.     Breath sounds: Normal breath sounds. No wheezing, rhonchi or rales.  Abdominal:     General: Bowel sounds are normal. There is no distension.     Palpations: Abdomen is soft.     Tenderness: There is no abdominal tenderness. There is no right CVA tenderness, left CVA tenderness, guarding or rebound.  Musculoskeletal: Normal range of motion.     Right lower leg: No edema.     Left lower leg: No edema.  Skin:    General: Skin is warm and dry.     Capillary Refill: Capillary  refill takes less than 2  seconds.     Coloration: Skin is pale. Skin is not jaundiced.     Findings: No bruising or rash.  Neurological:     Mental Status: He is alert.     Motor: No weakness.     Coordination: Coordination normal.     Gait: Gait normal.  Psychiatric:        Mood and Affect: Mood normal.        Speech: Speech normal.        Behavior: Behavior normal.      Recent Results (from the past 2160 hour(s))  SARS CORONAVIRUS 2 Nasal Swab Aptima Multi Swab     Status: None   Collection Time: 02/04/19 10:28 AM   Specimen: Aptima Multi Swab; Nasal Swab  Result Value Ref Range   SARS Coronavirus 2 NEGATIVE NEGATIVE    Comment: (NOTE) SARS-CoV-2 target nucleic acids are NOT DETECTED. The SARS-CoV-2 RNA is generally detectable in upper and lower respiratory specimens during the acute phase of infection. Negative results do not preclude SARS-CoV-2 infection, do not rule out co-infections with other pathogens, and should not be used as the sole basis for treatment or other patient management decisions. Negative results must be combined with clinical observations, patient history, and epidemiological information. The expected result is Negative. Fact Sheet for Patients: SugarRoll.be Fact Sheet for Healthcare Providers: https://www.woods-mathews.com/ This test is not yet approved or cleared by the [place] FDA and  has been authorized for detection and/or diagnosis of SARS-CoV-2 by FDA under an Emergency Use Authorization (EUA). This EUA will remain  in effect (meaning this test can be used) for the duration of the COVID-19 declaration under Section 56 4(b)(1) of the Act, 21 U.S.C. section 360bbb-3(b)(1), unless the authorization is terminated or revoked sooner. Performed at [facility], [place] [address] [place], [place] [address]   Glucose, capillary     Status: Abnormal   Collection Time: 02/08/19 12:37 PM  Result Value Ref Range    Glucose-Capillary 164 (H) 70 - 99 mg/dL  Surgical pathology     Status: None   Collection Time: 02/08/19  1:27 PM  Result Value Ref Range   SURGICAL PATHOLOGY      Surgical Pathology CASE: [id number] PATIENT: Sima Matas Surgical Pathology Report     SPECIMEN SUBMITTED: A. Rectum polyp, distal; cold snare  CLINICAL HISTORY: None provided  PRE-OPERATIVE DIAGNOSIS: BH changes  POST-OPERATIVE DIAGNOSIS: None provided.     DIAGNOSIS: A.  RECTUM POLYP, DISTAL; COLD SNARE: - POLYPOID COLONIC MUCOSA WITH FEATURES OF MUCOSAL PROLAPSE. - NEGATIVE FOR DYSPLASIA AND MALIGNANCY.  GROSS DESCRIPTION: A. Labeled: Cold snare polyp rectum (distal) Received: In formalin Tissue fragment(s): 1 Size: 0.5 cm Description: Tan soft tissue fragment Entirely submitted in 1 cassette.   Final Diagnosis performed by Betsy Pries, MD.   Electronically signed 02/09/2019 9:03:29AM The electronic signature indicates that the named Attending Pathologist has evaluated the specimen  Technical component performed at [facility], [address] [place], [place] [address] Lab: [phone] Dir: Rush Farmer, MD, MMM  Professional component pe rformed at [facility], [facility], [place], [place], [place] [address] Lab: [id number] Dir: Dellia Nims. Rubinas, MD   POCT HgB A1C     Status: Abnormal   Collection Time: 02/25/19  9:09 AM  Result Value Ref Range   Hemoglobin A1C 7.4 (A) 4.0 - 5.6 %   HbA1c POC (<> result, manual entry)     HbA1c, POC (prediabetic range)  HbA1c, POC (controlled diabetic range)       PHQ2/9: Depression screen [facility] 2/9 02/25/2019 12/22/2018 11/23/2018 11/16/2018 09/15/2018  Decreased Interest 0 0 0 0 0  Down, Depressed, Hopeless 0 0 0 0 0  PHQ - 2 Score 0 0 0 0 0  Altered sleeping 0 0 0 0 0  Tired, decreased energy 0 0 0 0 0  Change in appetite 0 0 0 0 0  Feeling bad or failure about yourself  0 0 0 0 0  Trouble concentrating 0 0 0 0 0  Moving  slowly or fidgety/restless 0 0 0 0 0  Suicidal thoughts 0 0 0 0 0  PHQ-9 Score 0 0 0 0 0  Difficult doing work/chores Not difficult at all Not difficult at all Not difficult at all Not difficult at all Not difficult at all  Some recent data might be hidden    phq 9 is negative Reviewed by me today  Fall Risk: Fall Risk  02/25/2019 12/22/2018 11/23/2018 11/16/2018 09/15/2018  Falls in the past year? 0 0 0 0 1  Number falls in past yr: 0 0 - - 0  Injury with Fall? 0 0 - - 0  Risk for fall due to : - - - - -  Follow up - - - - -      Functional Status Survey: Is the patient deaf or have difficulty hearing?: Yes Does the patient have difficulty seeing, even when wearing glasses/contacts?: No Does the patient have difficulty concentrating, remembering, or making decisions?: No Does the patient have difficulty walking or climbing stairs?: No Does the patient have difficulty dressing or bathing?: No Does the patient have difficulty doing errands alone such as visiting a doctor's office or shopping?: No    Assessment & Plan:   Problem List Items Addressed This Visit      Cardiovascular and Mediastinum   HTN (hypertension)    BP elevated today, goal ~130/80 Will need to research chart and review nephrology notes to see hx and plan for renal protection With BP reading today, DM, microalbuminuria and hx of CKD will need something renal protective      Relevant Orders   COMPLETE METABOLIC PANEL WITH GFR     Digestive   GERD with esophagitis - per GI, well controlled sx with nexium, UTD on GI f/up and EGD/colonoscopy     Endocrine   Controlled type 2 diabetes mellitus with diabetic nephropathy ([place]) - Primary    A1C worse than past labs after recent D/C of multiple meds, but overall near goal for age Will need to add a med and see if I can adjust meds to what he will be able to be compliant with.      Relevant Orders   POCT HgB A1C (Completed)   COMPLETE METABOLIC PANEL WITH GFR    Ambulatory referral to Ophthalmology     Genitourinary   Chronic kidney disease (CKD), stage III (moderate) ([place]) Seeing nephrology - will copy labs   Relevant Orders   COMPLETE METABOLIC PANEL WITH GFR   VITAMIN D 25 Hydroxy (Vit-D Deficiency, Fractures)     Other   Hyperlipidemia    Pt mostly ompliant with meds, no SE, no myalgias, fatigue or jaundice Due for FLP and recheck CMP      Relevant Orders   COMPLETE METABOLIC PANEL WITH GFR   Lipid panel   Microalbuminuria -seeing nephrology    Other Visit Diagnoses    Medication monitoring encounter  Relevant Orders   COMPLETE METABOLIC PANEL WITH GFR   Lipid panel   CBC with Differential/Platelet   Need for immunization against influenza       Relevant Orders   Flu Vaccine QUAD High Dose(Fluad) (Completed)       Return in about 1 month (around 03/27/2019) for Routine follow-up - in office if willing (virtual if able to check his BP).   Delsa Grana, PA-C 02/25/19 10:07 AM

## 2019-02-26 LAB — CBC WITH DIFFERENTIAL/PLATELET
Absolute Monocytes: 554 cells/uL (ref 200–950)
Basophils Absolute: 67 cells/uL (ref 0–200)
Basophils Relative: 1.2 %
Eosinophils Absolute: 336 cells/uL (ref 15–500)
Eosinophils Relative: 6 %
HCT: 37.4 % — ABNORMAL LOW (ref 38.5–50.0)
Hemoglobin: 12.5 g/dL — ABNORMAL LOW (ref 13.2–17.1)
Lymphs Abs: 1462 cells/uL (ref 850–3900)
MCH: 31.2 pg (ref 27.0–33.0)
MCHC: 33.4 g/dL (ref 32.0–36.0)
MCV: 93.3 fL (ref 80.0–100.0)
MPV: 9.9 fL (ref 7.5–12.5)
Monocytes Relative: 9.9 %
Neutro Abs: 3181 cells/uL (ref 1500–7800)
Neutrophils Relative %: 56.8 %
Platelets: 314 10*3/uL (ref 140–400)
RBC: 4.01 10*6/uL — ABNORMAL LOW (ref 4.20–5.80)
RDW: 12.6 % (ref 11.0–15.0)
Total Lymphocyte: 26.1 %
WBC: 5.6 10*3/uL (ref 3.8–10.8)

## 2019-02-26 LAB — COMPLETE METABOLIC PANEL WITH GFR
AG Ratio: 2 (calc) (ref 1.0–2.5)
ALT: 19 U/L (ref 9–46)
AST: 18 U/L (ref 10–35)
Albumin: 4.3 g/dL (ref 3.6–5.1)
Alkaline phosphatase (APISO): 101 U/L (ref 35–144)
BUN/Creatinine Ratio: 13 (calc) (ref 6–22)
BUN: 18 mg/dL (ref 7–25)
CO2: 26 mmol/L (ref 20–32)
Calcium: 9.4 mg/dL (ref 8.6–10.3)
Chloride: 103 mmol/L (ref 98–110)
Creat: 1.36 mg/dL — ABNORMAL HIGH (ref 0.70–1.18)
GFR, Est African American: 59 mL/min/{1.73_m2} — ABNORMAL LOW (ref >=60)
GFR, Est Non African American: 51 mL/min/{1.73_m2} — ABNORMAL LOW (ref >=60)
Globulin: 2.2 g/dL (calc) (ref 1.9–3.7)
Glucose, Bld: 218 mg/dL — ABNORMAL HIGH (ref 65–99)
Potassium: 4.4 mmol/L (ref 3.5–5.3)
Sodium: 137 mmol/L (ref 135–146)
Total Bilirubin: 0.6 mg/dL (ref 0.2–1.2)
Total Protein: 6.5 g/dL (ref 6.1–8.1)

## 2019-02-26 LAB — VITAMIN D 25 HYDROXY (VIT D DEFICIENCY, FRACTURES): Vit D, 25-Hydroxy: 47 ng/mL (ref 30–100)

## 2019-02-26 LAB — LIPID PANEL
Cholesterol: 93 mg/dL (ref <200)
HDL: 39 mg/dL — ABNORMAL LOW (ref >=40)
LDL Cholesterol (Calc): 41 mg/dL (calc)
Non-HDL Cholesterol (Calc): 54 mg/dL (calc) (ref <130)
Total CHOL/HDL Ratio: 2.4 (calc) (ref <5.0)
Triglycerides: 54 mg/dL (ref <150)

## 2019-03-26 ENCOUNTER — Encounter: Payer: Self-pay | Admitting: Family Medicine

## 2019-03-26 ENCOUNTER — Other Ambulatory Visit: Payer: Self-pay

## 2019-03-26 ENCOUNTER — Ambulatory Visit (INDEPENDENT_AMBULATORY_CARE_PROVIDER_SITE_OTHER): Payer: Medicare Other | Admitting: Family Medicine

## 2019-03-26 VITALS — BP 104/62 | HR 100 | Temp 97.8°F | Resp 14 | Ht 71.0 in | Wt 180.4 lb

## 2019-03-26 DIAGNOSIS — N1831 Chronic kidney disease, stage 3a: Secondary | ICD-10-CM | POA: Diagnosis not present

## 2019-03-26 DIAGNOSIS — E1169 Type 2 diabetes mellitus with other specified complication: Secondary | ICD-10-CM

## 2019-03-26 DIAGNOSIS — D649 Anemia, unspecified: Secondary | ICD-10-CM

## 2019-03-26 DIAGNOSIS — R231 Pallor: Secondary | ICD-10-CM

## 2019-03-26 DIAGNOSIS — I10 Essential (primary) hypertension: Secondary | ICD-10-CM

## 2019-03-26 DIAGNOSIS — R5383 Other fatigue: Secondary | ICD-10-CM

## 2019-03-26 DIAGNOSIS — D500 Iron deficiency anemia secondary to blood loss (chronic): Secondary | ICD-10-CM

## 2019-03-26 DIAGNOSIS — I951 Orthostatic hypotension: Secondary | ICD-10-CM

## 2019-03-26 NOTE — Progress Notes (Signed)
Patient ID: Paul Spears, male    DOB: 12/05/1945, 73 y.o.   MRN: [id number]  PCP: Fredderick Severance, NP (Inactive)  Chief Complaint  Patient presents with  . Follow-up  . Hypertension  . Diabetes    Subjective:   Paul Spears is a 73 y.o. male, presents to clinic with CC of the following:  Pt here for blood pressure recheck.  Has not taken any BP meds and BP is much lower than last month.  He also brings in a paper with daily BP and blood sugar readings.  Most days his blood pressure over the last month was 130's/70-80's, but the last couple days has been 100-110/60-70.  He also feels tired, has felt some rapid HR at rest and when waking up in the morning, states it feels like hes "getting anemic again"  No syncopal episodes, CP.   Lab OV was one month and blood pressure was elevated to 142/82  BP Readings from Last 5 Encounters:  03/26/19 104/62  02/25/19 (!) 142/82  02/08/19 114/72  12/22/18 120/74  11/23/18 116/72   Was going to check with nephro on any BP or renal protective meds.  Pt has not seen him in the last month.  He has CKD stage 3 - sees [facility] Lab Results  Component Value Date   CREATININE 1.36 (H) 02/25/2019   Lab Results  Component Value Date   BUN 18 02/25/2019   Lab Results  Component Value Date   GFRNONAA 51 (L) 02/25/2019    Diabetes Mellitus- recheck after last month OV reported poor med compliance and higher blood sugars Currently managing with metformin - he was previously on trulicity and actos but not taking when I saw him last, he found his old actos and started taking it again - 15 mg daily, no SE Fasting CBGs have been improved since last month - he brings in chart over last month all CBGs 110-170's with a few readings higher, but no >200 like last month.   Recent pertinent labs: Lab Results  Component Value Date   HGBA1C 7.4 (A) 02/25/2019   HGBA1C 6.3 (H) 11/23/2018   HGBA1C 7.7 (H) 08/21/2018   HPI from 02/25/2019: DM:  Metformin 1000 mg BID Morning fasting blood sugars 170-200 Recently stopped trulicity, amaryl and actos and since then sugars have been higher Now only on metformin and often forgets to take in the morning and sometimes forgets to take at night He does not have his meter with him No polyuria, polydipsia, polyphagia, peripheral neuropathy, visual disturbances, weight loss Regarding recent med changes - pt is unsure of any SE related to meds and reasons why they were D/Cd other than trulicity caused stomach upset  Anemia: Hemoglobin went from 13.1 to 12.5 with hx of iron deficiency anemia, he just had colonoscopy which was neg, he denies melena, hematochezia.  He does feel like he is anemic similar to in the past.  He does feels tired all the time.  Has rapid heart rate.    BP soft in clinic today   Patient Active Problem List   Diagnosis Date Noted  . Thrombocytosis ([place]) 12/22/2018  . Iron deficiency anemia 11/11/2017  . DDD (degenerative disc disease), cervical 12/07/2015  . Absent peripheral pulse 03/22/2015  . GERD with esophagitis 09/21/2014  . Barrett esophagus 09/21/2014  . Cervical osteoarthritis 09/21/2014  . Chronic kidney disease (CKD), stage III (moderate) 09/21/2014  . Controlled type 2 diabetes mellitus with diabetic nephropathy ([place]) 09/21/2014  .  Failure of erection 09/21/2014  . Microalbuminuria 09/21/2014  . Osteopenia 09/21/2014  . HTN (hypertension) 05/20/2014  . Hyperlipidemia 05/20/2014      Current Outpatient Medications:  .  albuterol (PROAIR HFA) 108 (90 Base) MCG/ACT inhaler, Inhale 2 puffs into the lungs every 4 (four) hours as needed for wheezing or shortness of breath., Disp: 1 Inhaler, Rfl: 1 .  atorvastatin (LIPITOR) 80 MG tablet, Take 1 tablet (80 mg total) by mouth daily., Disp: 90 tablet, Rfl: 1 .  esomeprazole (NEXIUM) 40 MG capsule, Take 40 mg by mouth daily. , Disp: , Rfl:  .  ezetimibe (ZETIA) 10 MG tablet, Take 1 tablet (10 mg total) by  mouth daily., Disp: 90 tablet, Rfl: 1 .  metFORMIN (GLUCOPHAGE) 1000 MG tablet, Take 1 tablet (1,000 mg total) by mouth 2 (two) times daily with a meal., Disp: 180 tablet, Rfl: 1 .  pioglitazone (ACTOS) 15 MG tablet, Take 15 mg by mouth daily., Disp: , Rfl:  .  Cholecalciferol (VITAMIN D3 PO), Take 1 capsule by mouth daily., Disp: , Rfl:  .  fluticasone (FLONASE) 50 MCG/ACT nasal spray, Place 2 sprays into both nostrils daily as needed., Disp: 16 g, Rfl: 11 .  glucose blood (ACCU-CHEK AVIVA PLUS) test strip, Check fingerstick blood sugar once a day; LON 99 months, Dx E11.22, Disp: 100 each, Rfl: 3 .  Lancets (ACCU-CHEK MULTICLIX) lancets, USE AS DIRECTED, Disp: 102 each, Rfl: 12   Allergies  Allergen Reactions  . Losartan Potassium Other (See Comments)    hyperkalemia     Family History  Problem Relation Age of Onset  . Hypertension Father   . Heart attack Father   . Heart disease Sister   . Hypertension Sister      Social History   Socioeconomic History  . Marital status: Divorced    Spouse name: Not on file  . Number of children: 3  . Years of education: Not on file  . Highest education level: GED or equivalent  Occupational History  . Not on file  Social Needs  . Financial resource strain: Not hard at all  . Food insecurity    Worry: Never true    Inability: Never true  . Transportation needs    Medical: No    Non-medical: No  Tobacco Use  . Smoking status: Never Smoker  . Smokeless tobacco: Never Used  Substance and Sexual Activity  . Alcohol use: No  . Drug use: No  . Sexual activity: Not Currently  Lifestyle  . Physical activity    Days per week: 0 days    Minutes per session: 0 min  . Stress: Not at all  Relationships  . Social connections    Talks on phone: More than three times a week    Gets together: More than three times a week    Attends religious service: More than 4 times per year    Active member of club or organization: Yes    Attends  meetings of clubs or organizations: 1 to 4 times per year    Relationship status: Divorced  . Intimate partner violence    Fear of current or ex partner: No    Emotionally abused: No    Physically abused: No    Forced sexual activity: No  Other Topics Concern  . Not on file  Social History Narrative  . Not on file    I personally reviewed active problem list, medication list, allergies, family history, social history, health maintenance, notes  from last encounter, lab results with the patient/caregiver today.  Review of Systems  Constitutional: Positive for fatigue. Negative for activity change, appetite change, chills, diaphoresis, fever and unexpected weight change.  HENT: Negative.   Eyes: Negative.   Respiratory: Negative.  Negative for chest tightness and shortness of breath.   Cardiovascular: Positive for palpitations (rapid HR). Negative for chest pain and leg swelling.  Gastrointestinal: Positive for diarrhea. Negative for abdominal distention, abdominal pain, anal bleeding, blood in stool, constipation, nausea and vomiting.  Endocrine: Negative.   Genitourinary: Negative.  Negative for decreased urine volume, difficulty urinating, testicular pain and urgency.  Musculoskeletal: Negative.   Skin: Positive for pallor (appears similar to last visit). Negative for color change and rash.  Allergic/Immunologic: Negative.   Neurological: Negative.  Negative for syncope, weakness, light-headedness and numbness.  Hematological: Negative for adenopathy.  Psychiatric/Behavioral: Negative.  Negative for confusion, dysphoric mood, self-injury and suicidal ideas. The patient is not nervous/anxious.   All other systems reviewed and are negative.      Objective:   Vitals:   03/26/19 0944  BP: 104/62  Pulse: 100  Resp: 14  Temp: 97.8 F (36.6 C)  SpO2: 96%  Weight: 180 lb 6.4 oz (81.8 kg)  Height: 5\' 11"  (1.803 m)    Body mass index is 25.16 kg/m.  Physical Exam Vitals signs  and nursing note reviewed.  Constitutional:      General: He is not in acute distress.    Appearance: Normal appearance. He is well-developed. He is not ill-appearing, toxic-appearing or diaphoretic.  HENT:     Head: Normocephalic and atraumatic.     Jaw: No trismus.     Right Ear: Tympanic membrane, ear canal and external ear normal.     Left Ear: Tympanic membrane, ear canal and external ear normal.     Nose: Nose normal. No mucosal edema or rhinorrhea.     Right Sinus: No maxillary sinus tenderness or frontal sinus tenderness.     Left Sinus: No maxillary sinus tenderness or frontal sinus tenderness.     Mouth/Throat:     Mouth: Mucous membranes are moist.     Pharynx: Oropharynx is clear. Uvula midline. No oropharyngeal exudate, posterior oropharyngeal erythema or uvula swelling.  Eyes:     General: Lids are normal. No scleral icterus.    Conjunctiva/sclera: Conjunctivae normal.     Pupils: Pupils are equal, round, and reactive to light.  Neck:     Musculoskeletal: Normal range of motion and neck supple.     Trachea: Trachea and phonation normal. No tracheal deviation.  Cardiovascular:     Rate and Rhythm: Normal rate and regular rhythm.     Pulses: Normal pulses.          Radial pulses are 2+ on the right side and 2+ on the left side.       Posterior tibial pulses are 2+ on the right side and 2+ on the left side.     Heart sounds: Normal heart sounds. No murmur. No friction rub. No gallop.   Pulmonary:     Effort: Pulmonary effort is normal.     Breath sounds: Normal breath sounds. No wheezing, rhonchi or rales.  Abdominal:     General: Bowel sounds are normal. There is no distension.     Palpations: Abdomen is soft.     Tenderness: There is no abdominal tenderness. There is no guarding or rebound.  Musculoskeletal: Normal range of motion.  Skin:    General:  Skin is warm and dry.     Capillary Refill: Capillary refill takes less than 2 seconds.     Coloration: Skin is  pale. Skin is not jaundiced.     Findings: No erythema or rash.  Neurological:     Mental Status: He is alert and oriented to person, place, and time.     Gait: Gait normal.  Psychiatric:        Mood and Affect: Mood normal.        Speech: Speech normal.        Behavior: Behavior normal.      Results for orders placed or performed in visit on 02/25/19  COMPLETE METABOLIC PANEL WITH GFR  Result Value Ref Range   Glucose, Bld 218 (H) 65 - 99 mg/dL   BUN 18 7 - 25 mg/dL   Creat 1.36 (H) 0.70 - 1.18 mg/dL   GFR, Est Non African American 51 (L) > OR = 60 mL/min/1.60m2   GFR, Est African American 59 (L) > OR = 60 mL/min/1.7m2   BUN/Creatinine Ratio 13 6 - 22 (calc)   Sodium 137 135 - 146 mmol/L   Potassium 4.4 3.5 - 5.3 mmol/L   Chloride 103 98 - 110 mmol/L   CO2 26 20 - 32 mmol/L   Calcium 9.4 8.6 - 10.3 mg/dL   Total Protein 6.5 6.1 - 8.1 g/dL   Albumin 4.3 3.6 - 5.1 g/dL   Globulin 2.2 1.9 - 3.7 g/dL (calc)   AG Ratio 2.0 1.0 - 2.5 (calc)   Total Bilirubin 0.6 0.2 - 1.2 mg/dL   Alkaline phosphatase (APISO) 101 35 - 144 U/L   AST 18 10 - 35 U/L   ALT 19 9 - 46 U/L  Lipid panel  Result Value Ref Range   Cholesterol 93 <200 mg/dL   HDL 39 (L) > OR = 40 mg/dL   Triglycerides 54 <150 mg/dL   LDL Cholesterol (Calc) 41 mg/dL (calc)   Total CHOL/HDL Ratio 2.4 <5.0 (calc)   Non-HDL Cholesterol (Calc) 54 <130 mg/dL (calc)  CBC with Differential/Platelet  Result Value Ref Range   WBC 5.6 3.8 - 10.8 Thousand/uL   RBC 4.01 (L) 4.20 - 5.80 Million/uL   Hemoglobin 12.5 (L) 13.2 - 17.1 g/dL   HCT 37.4 (L) 38.5 - 50.0 %   MCV 93.3 80.0 - 100.0 fL   MCH 31.2 27.0 - 33.0 pg   MCHC 33.4 32.0 - 36.0 g/dL   RDW 12.6 11.0 - 15.0 %   Platelets 314 140 - 400 Thousand/uL   MPV 9.9 7.5 - 12.5 fL   Neutro Abs 3,181 1,500 - 7,800 cells/uL   Lymphs Abs 1,462 850 - 3,900 cells/uL   Absolute Monocytes 554 200 - 950 cells/uL   Eosinophils Absolute 336 15 - 500 cells/uL   Basophils Absolute 67  0 - 200 cells/uL   Neutrophils Relative % 56.8 %   Total Lymphocyte 26.1 %   Monocytes Relative 9.9 %   Eosinophils Relative 6.0 %   Basophils Relative 1.2 %  VITAMIN D 25 Hydroxy (Vit-D Deficiency, Fractures)  Result Value Ref Range   Vit D, 25-Hydroxy 47 30 - 100 ng/mL  POCT HgB A1C  Result Value Ref Range   Hemoglobin A1C 7.4 (A) 4.0 - 5.6 %   HbA1c POC (<> result, manual entry)     HbA1c, POC (prediabetic range)     HbA1c, POC (controlled diabetic range)      Orthostatic Lying    BP-  Lying - - 98/56  Pulse- Lying - - 69  Orthostatic Sitting   BP- Sitting - - 88/56BP- Sitting. 88/56. Data is abnormal. Taken on 03/26/19 1012  Pulse- Sitting - - 84  Orthostatic Standing at 0 minutes   BP- Standing at 0 minutes - - 90/60  Pulse- Standing at 0 minutes - - 93        Assessment & Plan:       ICD-10-CM   1. Orthostasis  I95.1    Patient denies lightheadedness or near syncope just fatigue -labs, encouraged to push oral rehydration solution, ER or urgent care with any near syncope  2. Fatigue, unspecified type  R53.83 CBC (INCLUDES DIFF/PLT) WITH PATHOLOGIST REVIEW    Iron, TIBC and Ferritin Panel    B12 and Folate Panel    BASIC METABOLIC PANEL WITH GFR    TSH   Rechecking CBC, stool cards, thyroid  3. Stage 3a chronic kidney disease  [id number] BASIC METABOLIC PANEL WITH GFR  4. Iron deficiency anemia due to chronic blood loss  D50.0 CBC (INCLUDES DIFF/PLT) WITH PATHOLOGIST REVIEW    B12 and Folate Panel   History of anemia, unlikely to be GI blood loss but rechecking stool cards, adding iron panel and B12 and folate  5. Type 2 diabetes mellitus with other specified complication, without long-term current use of insulin (HCC)  E11.69    Blood sugars improved with adding Actos to metformin  6. Essential hypertension  [id number] BASIC METABOLIC PANEL WITH GFR   Blood pressure soft to hypotensive without meds, patient may be hypokalemic secondary to some diarrhea?    7. Skin pallor   [id number] BASIC METABOLIC PANEL WITH GFR   Continued pallor, cannot determine difference from his last visit where he was also very pale, rechecking labs  8. Anemia, unspecified type  D64.9 TSH   See above    Reviewed to the patient concerning signs and symptoms that he should go immediately to the ER or call 911, he verbalized understanding. He was instructed to get electrolyte solution or oral rehydration solution from the pharmacy push fluids, and if he continues to be asymptomatic over the weekend I will see him for close follow-up on Monday    Delsa Grana, PA-C 03/26/19 9:47 AM

## 2019-03-26 NOTE — Patient Instructions (Signed)
Push clear fluids - oral rehydration since your BP is so low Return stool cards  Please go to the ER if you feel like you are going to pass out   Orthostatic Hypotension Blood pressure is a measurement of how strongly, or weakly, your blood is pressing against the walls of your arteries. Orthostatic hypotension is a sudden drop in blood pressure that happens when you quickly change positions, such as when you get up from sitting or lying down. Arteries are blood vessels that carry blood from your heart throughout your body. When blood pressure is too low, you may not get enough blood to your brain or to the rest of your organs. This can cause weakness, light-headedness, rapid heartbeat, and fainting. This can last for just a few seconds or for up to a few minutes. Orthostatic hypotension is usually not a serious problem. However, if it happens frequently or gets worse, it may be a sign of something more serious. What are the causes? This condition may be caused by:  Sudden changes in posture, such as standing up quickly after you have been sitting or lying down.  Blood loss.  Loss of body fluids (dehydration).  Heart problems.  Hormone (endocrine) problems.  Pregnancy.  Severe infection.  Lack of certain nutrients.  Severe allergic reactions (anaphylaxis).  Certain medicines, such as blood pressure medicine or medicines that make the body lose excess fluids (diuretics). Sometimes, this condition can be caused by not taking medicine as directed, such as taking too much of a certain medicine. What increases the risk? The following factors may make you more likely to develop this condition:  Age. Risk increases as you get older.  Conditions that affect the heart or the central nervous system.  Taking certain medicines, such as blood pressure medicine or diuretics.  Being pregnant. What are the signs or symptoms? Symptoms of this condition may  include:  Weakness.  Light-headedness.  Dizziness.  Blurred vision.  Fatigue.  Rapid heartbeat.  Fainting, in severe cases. How is this diagnosed? This condition is diagnosed based on:  Your medical history.  Your symptoms.  Your blood pressure measurement. Your health care provider will check your blood pressure when you are: ? Lying down. ? Sitting. ? Standing. A blood pressure reading is recorded as two numbers, such as "120 over 80" (or 120/80). The first ("top") number is called the systolic pressure. It is a measure of the pressure in your arteries as your heart beats. The second ("bottom") number is called the diastolic pressure. It is a measure of the pressure in your arteries when your heart relaxes between beats. Blood pressure is measured in a unit called mm Hg. Healthy blood pressure for most adults is 120/80. If your blood pressure is below 90/60, you may be diagnosed with hypotension. Other information or tests that may be used to diagnose orthostatic hypotension include:  Your other vital signs, such as your heart rate and temperature.  Blood tests.  Tilt table test. For this test, you will be safely secured to a table that moves you from a lying position to an upright position. Your heart rhythm and blood pressure will be monitored during the test. How is this treated? This condition may be treated by:  Changing your diet. This may involve eating more salt (sodium) or drinking more water.  Taking medicines to raise your blood pressure.  Changing the dosage of certain medicines you are taking that might be lowering your blood pressure.  Wearing compression  stockings. These stockings help to prevent blood clots and reduce swelling in your legs. In some cases, you may need to go to the hospital for:  Fluid replacement. This means you will receive fluids through an IV.  Blood replacement. This means you will receive donated blood through an IV  (transfusion).  Treating an infection or heart problems, if this applies.  Monitoring. You may need to be monitored while medicines that you are taking wear off. Follow these instructions at home: Eating and drinking   Drink enough fluid to keep your urine pale yellow.  Eat a healthy diet, and follow instructions from your health care provider about eating or drinking restrictions. A healthy diet includes: ? Fresh fruits and vegetables. ? Whole grains. ? Lean meats. ? Low-fat dairy products.  Eat extra salt only as directed. Do not add extra salt to your diet unless your health care provider told you to do that.  Eat frequent, small meals.  Avoid standing up suddenly after eating. Medicines  Take over-the-counter and prescription medicines only as told by your health care provider. ? Follow instructions from your health care provider about changing the dosage of your current medicines, if this applies. ? Do not stop or adjust any of your medicines on your own. General instructions   Wear compression stockings as told by your health care provider.  Get up slowly from lying down or sitting positions. This gives your blood pressure a chance to adjust.  Avoid hot showers and excessive heat as directed by your health care provider.  Return to your normal activities as told by your health care provider. Ask your health care provider what activities are safe for you.  Do not use any products that contain nicotine or tobacco, such as cigarettes, e-cigarettes, and chewing tobacco. If you need help quitting, ask your health care provider.  Keep all follow-up visits as told by your health care provider. This is important. Contact a health care provider if you:  Vomit.  Have diarrhea.  Have a fever for more than 2-3 days.  Feel more thirsty than usual.  Feel weak and tired. Get help right away if you:  Have chest pain.  Have a fast or irregular heartbeat.  Develop  numbness in any part of your body.  Cannot move your arms or your legs.  Have trouble speaking.  Become sweaty or feel light-headed.  Faint.  Feel short of breath.  Have trouble staying awake.  Feel confused. Summary  Orthostatic hypotension is a sudden drop in blood pressure that happens when you quickly change positions.  Orthostatic hypotension is usually not a serious problem.  It is diagnosed by having your blood pressure taken lying down, sitting, and then standing.  It may be treated by changing your diet or adjusting your medicines. This information is not intended to replace advice given to you by your health care provider. Make sure you discuss any questions you have with your health care provider. Document Released: 05/24/2002 Document Revised: 11/27/2017 Document Reviewed: 11/27/2017 Elsevier Patient Education  [place].    Rehydration, Adult Rehydration is the replacement of body fluids and salts and minerals (electrolytes) that are lost during dehydration. Dehydration is when there is not enough fluid or water in the body. This happens when you lose more fluids than you take in. Common causes of dehydration include:  Vomiting.  Diarrhea.  Excessive sweating, such as from heat exposure or exercise.  Taking medicines that cause the body to lose  excess fluid (diuretics).  Impaired kidney function.  Not drinking enough fluid.  Certain illnesses or infections.  Certain poorly controlled long-term (chronic) illnesses, such as diabetes, heart disease, and kidney disease.  Symptoms of mild dehydration may include thirst, dry lips and mouth, dry skin, and dizziness. Symptoms of severe dehydration may include increased heart rate, confusion, fainting, and not urinating. You can rehydrate by drinking certain fluids or getting fluids through an IV tube, as told by your health care provider. What are the risks? Generally, rehydration is safe. However,  one problem that can happen is taking in too much fluid (overhydration). This is rare. If overhydration happens, it can cause an electrolyte imbalance, kidney failure, or a decrease in salt (sodium) levels in the body. How to rehydrate Follow instructions from your health care provider for rehydration. The kind of fluid you should drink and the amount you should drink depend on your condition.  If directed by your health care provider, drink an oral rehydration solution (ORS). This is a drink designed to treat dehydration that is found in pharmacies and retail stores. ? Make an ORS by following instructions on the package. ? Start by drinking small amounts, about  cup (120 mL) every 5-10 minutes. ? Slowly increase how much you drink until you have taken the amount recommended by your health care provider.  Drink enough clear fluids to keep your urine clear or pale yellow. If you were instructed to drink an ORS, finish the ORS first, then start slowly drinking other clear fluids. Drink fluids such as: ? Water. Do not drink only water. Doing that can lead to having too little sodium in your body (hyponatremia). ? Ice chips. ? Fruit juice that you have added water to (diluted juice). ? Low-calorie sports drinks.  If you are severely dehydrated, your health care provider may recommend that you receive fluids through an IV tube in the hospital.  Do not take sodium tablets. Doing that can lead to the condition of having too much sodium in your body (hypernatremia). Eating while you rehydrate Follow instructions from your health care provider about what to eat while you rehydrate. Your health care provider may recommend that you slowly begin eating regular foods in small amounts.  Eat foods that contain a healthy balance of electrolytes, such as bananas, oranges, potatoes, tomatoes, and spinach.  Avoid foods that are greasy or contain a lot of fat or sugar.  In some cases, you may get nutrition  through a feeding tube that is passed through your nose and into your stomach (nasogastric tube, or NG tube). This may be done if you have uncontrolled vomiting or diarrhea. Beverages to avoid Certain beverages may make dehydration worse. While you rehydrate, avoid:  Alcohol.  Caffeine.  Drinks that contain a lot of sugar. These include: ? High-calorie sports drinks. ? Fruit juice that is not diluted. ? Soda.  Check nutrition labels to see how much sugar or caffeine a beverage contains. Signs of dehydration recovery You may be recovering from dehydration if:  You are urinating more often than before you started rehydrating.  Your urine is clear or pale yellow.  Your energy level improves.  You vomit less frequently.  You have diarrhea less frequently.  Your appetite improves or returns to normal.  You feel less dizzy or less light-headed.  Your skin tone and color start to look more normal. Contact a health care provider if:  You continue to have symptoms of mild dehydration, such as: ?  Thirst. ? Dry lips. ? Slightly dry mouth. ? Dry, warm skin. ? Dizziness.  You continue to vomit or have diarrhea. Get help right away if:  You have symptoms of dehydration that get worse.  You feel: ? Confused. ? Weak. ? Like you are going to faint.  You have not urinated in 6-8 hours.  You have very dark urine.  You have trouble breathing.  Your heart rate while sitting still is over 100 beats a minute.  You cannot drink fluids without vomiting.  You have vomiting or diarrhea that: ? Gets worse. ? Does not go away.  You have a fever. This information is not intended to replace advice given to you by your health care provider. Make sure you discuss any questions you have with your health care provider. Document Released: 08/26/2011 Document Revised: 05/16/2017 Document Reviewed: 07/28/2015 Elsevier Patient Education  2020 [employer].

## 2019-03-29 ENCOUNTER — Ambulatory Visit (INDEPENDENT_AMBULATORY_CARE_PROVIDER_SITE_OTHER): Payer: Medicare Other | Admitting: Family Medicine

## 2019-03-29 ENCOUNTER — Encounter: Payer: Self-pay | Admitting: Family Medicine

## 2019-03-29 ENCOUNTER — Other Ambulatory Visit: Payer: Self-pay

## 2019-03-29 VITALS — BP 122/64 | HR 94 | Temp 98.5°F | Resp 14 | Ht 72.0 in | Wt 181.4 lb

## 2019-03-29 DIAGNOSIS — D500 Iron deficiency anemia secondary to blood loss (chronic): Secondary | ICD-10-CM

## 2019-03-29 DIAGNOSIS — I951 Orthostatic hypotension: Secondary | ICD-10-CM

## 2019-03-29 DIAGNOSIS — E1122 Type 2 diabetes mellitus with diabetic chronic kidney disease: Secondary | ICD-10-CM | POA: Diagnosis not present

## 2019-03-29 DIAGNOSIS — I10 Essential (primary) hypertension: Secondary | ICD-10-CM

## 2019-03-29 DIAGNOSIS — N182 Chronic kidney disease, stage 2 (mild): Secondary | ICD-10-CM

## 2019-03-29 LAB — IRON,TIBC AND FERRITIN PANEL
%SAT: 38 % (calc) (ref 20–48)
Ferritin: 30 ng/mL (ref 24–380)
Iron: 139 ug/dL (ref 50–180)
TIBC: 368 mcg/dL (calc) (ref 250–425)

## 2019-03-29 LAB — CBC (INCLUDES DIFF/PLT) WITH PATHOLOGIST REVIEW
Absolute Monocytes: 435 cells/uL (ref 200–950)
Basophils Absolute: 80 cells/uL (ref 0–200)
Basophils Relative: 1.5 %
Eosinophils Absolute: 212 cells/uL (ref 15–500)
Eosinophils Relative: 4 %
HCT: 39.1 % (ref 38.5–50.0)
Hemoglobin: 12.9 g/dL — ABNORMAL LOW (ref 13.2–17.1)
Lymphs Abs: 1431 cells/uL (ref 850–3900)
MCH: 30.1 pg (ref 27.0–33.0)
MCHC: 33 g/dL (ref 32.0–36.0)
MCV: 91.4 fL (ref 80.0–100.0)
MPV: 10.1 fL (ref 7.5–12.5)
Monocytes Relative: 8.2 %
Neutro Abs: 3143 cells/uL (ref 1500–7800)
Neutrophils Relative %: 59.3 %
Platelets: 307 10*3/uL (ref 140–400)
RBC: 4.28 10*6/uL (ref 4.20–5.80)
RDW: 13.1 % (ref 11.0–15.0)
Total Lymphocyte: 27 %
WBC: 5.3 10*3/uL (ref 3.8–10.8)

## 2019-03-29 LAB — BASIC METABOLIC PANEL WITH GFR
BUN: 16 mg/dL (ref 7–25)
CO2: 26 mmol/L (ref 20–32)
Calcium: 9.6 mg/dL (ref 8.6–10.3)
Chloride: 105 mmol/L (ref 98–110)
Creat: 1.17 mg/dL (ref 0.70–1.18)
GFR, Est African American: 71 mL/min/{1.73_m2} (ref >=60)
GFR, Est Non African American: 61 mL/min/{1.73_m2} (ref >=60)
Glucose, Bld: 177 mg/dL — ABNORMAL HIGH (ref 65–99)
Potassium: 4.4 mmol/L (ref 3.5–5.3)
Sodium: 140 mmol/L (ref 135–146)

## 2019-03-29 LAB — B12 AND FOLATE PANEL
Folate: 14 ng/mL
Vitamin B-12: 339 pg/mL (ref 200–1100)

## 2019-03-29 LAB — POC HEMOCCULT BLD/STL (HOME/3-CARD/SCREEN)
Card #1 Date: 10092020
Card #2 Date: 10102020
Card #2 Fecal Occult Blod, POC: NEGATIVE
Card #3 Date: 10112020
Card #3 Fecal Occult Blood, POC: NEGATIVE
Fecal Occult Blood, POC: NEGATIVE

## 2019-03-29 LAB — TSH: TSH: 1.33 mIU/L (ref 0.40–4.50)

## 2019-03-29 NOTE — Progress Notes (Signed)
Patient ID: Paul Spears, male    DOB: [birth year], 73 y.o.   MRN: [id number]  PCP: Delsa Grana, PA-C  Chief Complaint  Patient presents with  . Follow-up  . Hypertension    Subjective:   Paul Spears is a 73 y.o. male, presents to clinic with CC of the following:  HPI  Patient here to follow-up on soft blood pressure.  Last Friday he was here for routine office visit and his blood pressure was much lower than previous readings and with orthostatics dropped even further with increasing heart rate.  Labs were rechecked and patient was advised to hydrate and return today with stool cards.  He also had recent anemia on his blood work, he tends alternate loose bowel movements with constipation he did take Pepto-Bismol and his stool was black for 2 of the stool samples he provided [place] today.  Blood pressure is much better today 122/64.  He is not having any lightheaded episodes, weakness, fatigue, near syncope, shortness of breath. His blood sugar this morning was around 130.  Recheck of his labs showed normal kidney function, normal electrolytes, stable but still anemic hemoglobin and hematocrit All his stool cards today were negative  Patient Active Problem List   Diagnosis Date Noted  . Iron deficiency anemia 11/11/2017  . DDD (degenerative disc disease), cervical 12/07/2015  . GERD with esophagitis 09/21/2014  . Barrett esophagus 09/21/2014  . Cervical osteoarthritis 09/21/2014  . Chronic kidney disease (CKD), stage III (moderate) 09/21/2014  . Controlled type 2 diabetes mellitus with diabetic nephropathy ([place]) 09/21/2014  . Microalbuminuria 09/21/2014  . Osteopenia 09/21/2014  . HTN (hypertension) 05/20/2014  . Hyperlipidemia 05/20/2014      Current Outpatient Medications:  .  albuterol (PROAIR HFA) 108 (90 Base) MCG/ACT inhaler, Inhale 2 puffs into the lungs every 4 (four) hours as needed for wheezing or shortness of breath., Disp: 1 Inhaler, Rfl: 1 .  atorvastatin  (LIPITOR) 80 MG tablet, Take 1 tablet (80 mg total) by mouth daily., Disp: 90 tablet, Rfl: 1 .  Cholecalciferol (VITAMIN D3 PO), Take 1 capsule by mouth daily., Disp: , Rfl:  .  esomeprazole (NEXIUM) 40 MG capsule, Take 40 mg by mouth daily. , Disp: , Rfl:  .  ezetimibe (ZETIA) 10 MG tablet, Take 1 tablet (10 mg total) by mouth daily., Disp: 90 tablet, Rfl: 1 .  fluticasone (FLONASE) 50 MCG/ACT nasal spray, Place 2 sprays into both nostrils daily as needed., Disp: 16 g, Rfl: 11 .  glucose blood (ACCU-CHEK AVIVA PLUS) test strip, Check fingerstick blood sugar once a day; LON 99 months, Dx E11.22, Disp: 100 each, Rfl: 3 .  Lancets (ACCU-CHEK MULTICLIX) lancets, USE AS DIRECTED, Disp: 102 each, Rfl: 12 .  metFORMIN (GLUCOPHAGE) 1000 MG tablet, Take 1 tablet (1,000 mg total) by mouth 2 (two) times daily with a meal., Disp: 180 tablet, Rfl: 1 .  pioglitazone (ACTOS) 15 MG tablet, Take 15 mg by mouth daily., Disp: , Rfl:    Allergies  Allergen Reactions  . Losartan Potassium Other (See Comments)    hyperkalemia     Family History  Problem Relation Age of Onset  . Hypertension Father   . Heart attack Father   . Heart disease Sister   . Hypertension Sister      Social History   Socioeconomic History  . Marital status: Divorced    Spouse name: Not on file  . Number of children: 3  . Years of education: Not on  file  . Highest education level: GED or equivalent  Occupational History  . Not on file  Social Needs  . Financial resource strain: Not hard at all  . Food insecurity    Worry: Never true    Inability: Never true  . Transportation needs    Medical: No    Non-medical: No  Tobacco Use  . Smoking status: Never Smoker  . Smokeless tobacco: Never Used  Substance and Sexual Activity  . Alcohol use: No  . Drug use: No  . Sexual activity: Not Currently  Lifestyle  . Physical activity    Days per week: 0 days    Minutes per session: 0 min  . Stress: Not at all   Relationships  . Social connections    Talks on phone: More than three times a week    Gets together: More than three times a week    Attends religious service: More than 4 times per year    Active member of club or organization: Yes    Attends meetings of clubs or organizations: 1 to 4 times per year    Relationship status: Divorced  . Intimate partner violence    Fear of current or ex partner: No    Emotionally abused: No    Physically abused: No    Forced sexual activity: No  Other Topics Concern  . Not on file  Social History Narrative  . Not on file    I personally reviewed active problem list, medication list, allergies, family history, social history, health maintenance, notes from last encounter, lab results, imaging with the patient/caregiver today.  Review of Systems  Constitutional: Negative.   HENT: Negative.   Eyes: Negative.   Respiratory: Negative.   Cardiovascular: Negative.   Gastrointestinal: Negative.   Endocrine: Negative.   Genitourinary: Negative.   Musculoskeletal: Negative.   Skin: Negative.   Allergic/Immunologic: Negative.   Neurological: Negative.   Hematological: Negative.   Psychiatric/Behavioral: Negative.   All other systems reviewed and are negative.      Objective:   Vitals:   03/29/19 1518  BP: 122/64  Pulse: 94  Resp: 14  Temp: 98.5 F (36.9 C)  SpO2: 97%  Weight: 181 lb 6.4 oz (82.3 kg)  Height: 6' (1.829 m)    Body mass index is 24.6 kg/m.  Physical Exam Vitals signs and nursing note reviewed.  Constitutional:      General: He is not in acute distress.    Appearance: Normal appearance. He is well-developed. He is not ill-appearing, toxic-appearing or diaphoretic.     Interventions: Face mask in place.  HENT:     Head: Normocephalic and atraumatic.     Jaw: No trismus.     Right Ear: External ear normal.     Left Ear: External ear normal.  Eyes:     General: Lids are normal. No scleral icterus.     Conjunctiva/sclera: Conjunctivae normal.     Pupils: Pupils are equal, round, and reactive to light.  Neck:     Musculoskeletal: Normal range of motion and neck supple.     Trachea: Trachea and phonation normal. No tracheal deviation.  Cardiovascular:     Rate and Rhythm: Normal rate and regular rhythm.     Pulses: Normal pulses.          Radial pulses are 2+ on the right side and 2+ on the left side.       Posterior tibial pulses are 2+ on the right side and  2+ on the left side.     Heart sounds: Normal heart sounds. No murmur. No friction rub. No gallop.   Pulmonary:     Effort: Pulmonary effort is normal. No respiratory distress.     Breath sounds: Normal breath sounds. No stridor. No wheezing, rhonchi or rales.  Abdominal:     General: Bowel sounds are normal. There is no distension.     Palpations: Abdomen is soft.     Tenderness: There is no abdominal tenderness. There is no guarding or rebound.  Musculoskeletal: Normal range of motion.     Right lower leg: No edema.     Left lower leg: No edema.  Skin:    General: Skin is warm and dry.     Capillary Refill: Capillary refill takes less than 2 seconds.     Coloration: Skin is pale (chronic). Skin is not jaundiced.     Findings: No rash.     Nails: There is no clubbing.   Neurological:     Mental Status: He is alert. Mental status is at baseline.     Cranial Nerves: No dysarthria or facial asymmetry.     Sensory: No sensory deficit.     Motor: No weakness, tremor or abnormal muscle tone.     Coordination: Coordination normal.     Gait: Gait normal.     Comments: HOH  Psychiatric:        Mood and Affect: Mood normal.        Speech: Speech normal.        Behavior: Behavior normal. Behavior is cooperative.      Results for orders placed or performed in visit on 03/29/19  POC Hemoccult Bld/Stl (3-Cd Home Screen)  Result Value Ref Range   Card #1 Date [id number]    Fecal Occult Blood, POC Negative Negative   Card #2 Date  [id number]    Card #2 Fecal Occult Blod, POC Negative    Card #3 Date [id number]    Card #3 Fecal Occult Blood, POC Negative         Assessment & Plan:    Patient here for recheck of soft blood pressure 3 days ago with orthostatic hypotension, blood pressure improved today, his labs were reviewed, he brought back stool cards to ensure that he had no GI blood losses causing his anemia they were negative.  Patient is feeling well, encouraged him to come for his routine follow-up in 3 months    ICD-10-CM   1. Essential hypertension  I10    well controlled   2. Orthostasis  I95.1    resolved - BP improved, no sx, may have been secondary to hypovolemia from diarrhea - encouraged to avoid triggering foods  3. Iron deficiency anemia due to chronic blood loss  D50.0 POC Hemoccult Bld/Stl (3-Cd Home Screen)    POC Hemoccult Bld/Stl (3-Cd Home Screen)   H/H stable, stool cards negative  4. CKD stage 2 due to type 2 diabetes mellitus ([place])  E11.22    N18.2    improved to stage 2 - continue to monitor      Return in about 3 months (around 06/29/2019) for Routine follow-up.    Delsa Grana, PA-C 03/29/19 4:18 PM

## 2019-04-28 LAB — HM DIABETES EYE EXAM

## 2019-05-11 ENCOUNTER — Other Ambulatory Visit: Payer: Self-pay | Admitting: Family Medicine

## 2019-05-11 NOTE — Telephone Encounter (Signed)
Requested medication (s) are due for refill today: yes  Requested medication (s) are on the active medication list: yes  Last refill:  12/25/2018  Future visit scheduled: yes   Notes to clinic: last filled by historical provider  Review for refill   Requested Prescriptions  Pending Prescriptions Disp Refills   pioglitazone (ACTOS) 15 MG tablet [Pharmacy Med Name: Pioglitazone HCl 15 MG Oral Tablet] 90 tablet 0    Sig: TAKE 1 TABLET BY MOUTH ONCE DAILY -  [place]  IF  WEIGHT  GAIN,  LEG  SWELLING,  [place]  BREATH     Endocrinology:  Diabetes - Glitazones - pioglitazone Passed - 05/11/2019 11:46 AM      Passed - HBA1C is between 0 and 7.9 and within 180 days    Hemoglobin A1C  Date Value Ref Range Status  02/25/2019 7.4 (A) 4.0 - 5.6 % Final   Hgb A1c MFr Bld  Date Value Ref Range Status  11/23/2018 6.3 (H) <5.7 % of total Hgb Final    Comment:    For someone without known diabetes, a hemoglobin  A1c value between 5.7% and 6.4% is consistent with prediabetes and should be confirmed with a  follow-up test. . For someone with known diabetes, a value <7% indicates that their diabetes is well controlled. A1c targets should be individualized based on duration of diabetes, age, comorbid conditions, and other considerations. . This assay result is consistent with an increased risk of diabetes. . Currently, no consensus exists regarding use of hemoglobin A1c for diagnosis of diabetes for children. Renella Cunas - Valid encounter within last 6 months    Recent Outpatient Visits          1 month ago Essential hypertension   [facility] [place], [place], PA-C   1 month ago [facility] Delsa Grana, PA-C   2 months ago Controlled type 2 diabetes mellitus with diabetic nephropathy, without long-term current use of insulin [facility])   [facility] Delsa Grana, PA-C   4  months ago Controlled type 2 diabetes mellitus with diabetic nephropathy, without long-term current use of insulin [facility])   [place], NP   5 months ago Uncontrolled type 2 diabetes with stage 3 chronic kidney disease GFR 30-59 [facility])   [place], [place], NP      Future Appointments            In 1 month  [facility], [place]   In 1 month Delsa Grana, PA-C [employer]

## 2019-06-22 ENCOUNTER — Ambulatory Visit (INDEPENDENT_AMBULATORY_CARE_PROVIDER_SITE_OTHER): Payer: Medicare Other

## 2019-06-22 DIAGNOSIS — Z0000 Encounter for general adult medical examination without abnormal findings: Secondary | ICD-10-CM

## 2019-06-22 NOTE — Patient Instructions (Signed)
Paul Spears , Thank you for taking time to come for your Medicare Wellness Visit. I appreciate your ongoing commitment to your health goals. Please review the following plan we discussed and let me know if I can assist you in the future.   Screening recommendations/referrals: Colonoscopy: done 02/08/19 Recommended yearly ophthalmology/optometry visit for glaucoma screening and checkup Recommended yearly dental visit for hygiene and checkup  Vaccinations: Influenza vaccine: done 02/25/19 Pneumococcal vaccine: done 03/06/18 Tdap vaccine: done 04/03/16 Shingles vaccine: Shingrix discussed. Please contact your pharmacy for coverage information.     Advanced directives: Advance directive discussed with you today. Even though you declined this today please call our office should you change your mind and we can give you the proper paperwork for you to fill out.  Conditions/risks identified: Recommend increasing physical activity   Next appointment: Please follow up in one year for your Medicare Annual Wellness visit.    Preventive Care 74 Years and Older, Male Preventive care refers to lifestyle choices and visits with your health care provider that can promote health and wellness. What does preventive care include?  A yearly physical exam. This is also called an annual well check.  Dental exams once or twice a year.  Routine eye exams. Ask your health care provider how often you should have your eyes checked.  Personal lifestyle choices, including:  Daily care of your teeth and gums.  Regular physical activity.  Eating a healthy diet.  Avoiding tobacco and drug use.  Limiting alcohol use.  Practicing safe sex.  Taking low doses of aspirin every day.  Taking vitamin and mineral supplements as recommended by your health care provider. What happens during an annual well check? The services and screenings done by your health care provider during your annual well check will depend on  your age, overall health, lifestyle risk factors, and family history of disease. Counseling  Your health care provider may ask you questions about your:  Alcohol use.  Tobacco use.  Drug use.  Emotional well-being.  Home and relationship well-being.  Sexual activity.  Eating habits.  History of falls.  Memory and ability to understand (cognition).  Work and work Statistician. Screening  You may have the following tests or measurements:  Height, weight, and BMI.  Blood pressure.  Lipid and cholesterol levels. These may be checked every 5 years, or more frequently if you are over 74 years old.  Skin check.  Lung cancer screening. You may have this screening every year starting at age 77 if you have a 30-pack-year history of smoking and currently smoke or have quit within the past 15 years.  Fecal occult blood test (FOBT) of the stool. You may have this test every year starting at age 50.  Flexible sigmoidoscopy or colonoscopy. You may have a sigmoidoscopy every 5 years or a colonoscopy every 10 years starting at age 66.  Prostate cancer screening. Recommendations will vary depending on your family history and other risks.  Hepatitis C blood test.  Hepatitis B blood test.  Sexually transmitted disease (STD) testing.  Diabetes screening. This is done by checking your blood sugar (glucose) after you have not eaten for a while (fasting). You may have this done every 1-3 years.  Abdominal aortic aneurysm (AAA) screening. You may need this if you are a current or former smoker.  Osteoporosis. You may be screened starting at age 28 if you are at high risk. Talk with your health care provider about your test results, treatment options, and  if necessary, the need for more tests. Vaccines  Your health care provider may recommend certain vaccines, such as:  Influenza vaccine. This is recommended every year.  Tetanus, diphtheria, and acellular pertussis (Tdap, Td) vaccine.  You may need a Td booster every 10 years.  Zoster vaccine. You may need this after age 25.  Pneumococcal 13-valent conjugate (PCV13) vaccine. One dose is recommended after age 20.  Pneumococcal polysaccharide (PPSV23) vaccine. One dose is recommended after age 48. Talk to your health care provider about which screenings and vaccines you need and how often you need them. This information is not intended to replace advice given to you by your health care provider. Make sure you discuss any questions you have with your health care provider. Document Released: 06/30/2015 Document Revised: 02/21/2016 Document Reviewed: 04/04/2015 Elsevier Interactive Patient Education  2017 [place] Prevention in the Home Falls can cause injuries. They can happen to people of all ages. There are many things you can do to make your home safe and to help prevent falls. What can I do on the outside of my home?  Regularly fix the edges of walkways and driveways and fix any cracks.  Remove anything that might make you trip as you walk through a door, such as a raised step or threshold.  Trim any bushes or trees on the path to your home.  Use bright outdoor lighting.  Clear any walking paths of anything that might make someone trip, such as rocks or tools.  Regularly check to see if handrails are loose or broken. Make sure that both sides of any steps have handrails.  Any raised decks and porches should have guardrails on the edges.  Have any leaves, snow, or ice cleared regularly.  Use sand or salt on walking paths during winter.  Clean up any spills in your garage right away. This includes oil or grease spills. What can I do in the bathroom?  Use night lights.  Install grab bars by the toilet and in the tub and shower. Do not use towel bars as grab bars.  Use non-skid mats or decals in the tub or shower.  If you need to sit down in the shower, use a plastic, non-slip stool.  Keep the  floor dry. Clean up any water that spills on the floor as soon as it happens.  Remove soap buildup in the tub or shower regularly.  Attach bath mats securely with double-sided non-slip rug tape.  Do not have throw rugs and other things on the floor that can make you trip. What can I do in the bedroom?  Use night lights.  Make sure that you have a light by your bed that is easy to reach.  Do not use any sheets or blankets that are too big for your bed. They should not hang down onto the floor.  Have a firm chair that has side arms. You can use this for support while you get dressed.  Do not have throw rugs and other things on the floor that can make you trip. What can I do in the kitchen?  Clean up any spills right away.  Avoid walking on wet floors.  Keep items that you use a lot in easy-to-reach places.  If you need to reach something above you, use a strong step stool that has a grab bar.  Keep electrical cords out of the way.  Do not use floor polish or wax that makes floors slippery. If you  must use wax, use non-skid floor wax.  Do not have throw rugs and other things on the floor that can make you trip. What can I do with my stairs?  Do not leave any items on the stairs.  Make sure that there are handrails on both sides of the stairs and use them. Fix handrails that are broken or loose. Make sure that handrails are as long as the stairways.  Check any carpeting to make sure that it is firmly attached to the stairs. Fix any carpet that is loose or worn.  Avoid having throw rugs at the top or bottom of the stairs. If you do have throw rugs, attach them to the floor with carpet tape.  Make sure that you have a light switch at the top of the stairs and the bottom of the stairs. If you do not have them, ask someone to add them for you. What else can I do to help prevent falls?  Wear shoes that:  Do not have high heels.  Have rubber bottoms.  Are comfortable and fit  you well.  Are closed at the toe. Do not wear sandals.  If you use a stepladder:  Make sure that it is fully opened. Do not climb a closed stepladder.  Make sure that both sides of the stepladder are locked into place.  Ask someone to hold it for you, if possible.  Clearly mark and make sure that you can see:  Any grab bars or handrails.  First and last steps.  Where the edge of each step is.  Use tools that help you move around (mobility aids) if they are needed. These include:  Canes.  Walkers.  Scooters.  Crutches.  Turn on the lights when you go into a dark area. Replace any light bulbs as soon as they burn out.  Set up your furniture so you have a clear path. Avoid moving your furniture around.  If any of your floors are uneven, fix them.  If there are any pets around you, be aware of where they are.  Review your medicines with your doctor. Some medicines can make you feel dizzy. This can increase your chance of falling. Ask your doctor what other things that you can do to help prevent falls. This information is not intended to replace advice given to you by your health care provider. Make sure you discuss any questions you have with your health care provider. Document Released: 03/30/2009 Document Revised: 11/09/2015 Document Reviewed: 07/08/2014 Elsevier Interactive Patient Education  2017 [employer].

## 2019-06-22 NOTE — Progress Notes (Addendum)
Subjective:   Paul Spears is a 74 y.o. male who presents for Medicare Annual/Subsequent preventive examination.  Virtual Visit via Telephone Note  I connected with [name] on 06/22/19 at  3:30 PM EST by telephone and verified that I am speaking with the correct person using two identifiers.  Medicare Annual Wellness visit completed telephonically due to Covid-19 pandemic.   Location: Patient: home Provider: office   I discussed the limitations, risks, security and privacy concerns of performing an evaluation and management service by telephone and the availability of in person appointments. The patient expressed understanding and agreed to proceed.  Some vital signs may be absent or patient reported.   Clemetine Marker, LPN    Review of Systems:   Cardiac Risk Factors include: advanced age (>17men, >45 women);diabetes mellitus;dyslipidemia;hypertension;male gender     Objective:    Vitals: There were no vitals taken for this visit.  There is no height or weight on file to calculate BMI.  Advanced Directives 06/22/2019 02/08/2019 07/14/2018 06/19/2018 04/09/2018 11/06/2017 04/22/2017  Does Patient Have a Medical Advance Directive? No No No No No No No  Would patient like information on creating a medical advance directive? No - Patient declined - - No - Patient declined No - Patient declined No - Patient declined No - Patient declined    Tobacco Social History   Tobacco Use  Smoking Status Never Smoker  Smokeless Tobacco Never Used     Counseling given: Not Answered   Clinical Intake:  Pre-visit preparation completed: Yes  Pain : 0-10 Pain Score: 5  Pain Type: Chronic pain Pain Location: Back Pain Descriptors / Indicators: Aching, Sore Pain Onset: More than a month ago Pain Frequency: Constant     Nutritional Risks: None Diabetes: Yes CBG done?: No Did pt. bring in CBG monitor from home?: No   Nutrition Risk Assessment:  Has the patient had any N/V/D  within the last 2 months?  No  Does the patient have any non-healing wounds?  No  Has the patient had any unintentional weight loss or weight gain?  No   Diabetes:  Is the patient diabetic?  Yes  If diabetic, was a CBG obtained today?  No  Did the patient bring in their glucometer from home?  No  How often do you monitor your CBG's? Several times per week.   Financial Strains and Diabetes Management:  Are you having any financial strains with the device, your supplies or your medication? No .  Does the patient want to be seen by Chronic Care Management for management of their diabetes?  No  Would the patient like to be referred to a Nutritionist or for Diabetic Management?  No   Diabetic Exams:  Diabetic Eye Exam: Completed 04/28/19.   Diabetic Foot Exam: Completed 08/21/18.   How often do you need to have someone help you when you read instructions, pamphlets, or other written materials from your doctor or pharmacy?: 1 - Never  Interpreter Needed?: No  Information entered by :: Clemetine Marker LPN  Past Medical History:  Diagnosis Date  . Allergy   . Barrett's esophagus   . Diabetes mellitus without complication ([place])   . Esophagitis    LA grade C  . Failure of erection 09/21/2014  . GERD (gastroesophageal reflux disease)   . Hyperlipidemia   . Hypertension   . Iron deficiency anemia 11/11/2017  . Kidney disease, chronic, stage III (GFR 30-59 ml/min)    Past Surgical History:  Procedure Laterality Date  . CARPAL TUNNEL RELEASE Right   . COLONOSCOPY  10/26/2012   repeat in 10 years MUS  . COLONOSCOPY WITH PROPOFOL N/A 02/08/2019   Procedure: COLONOSCOPY WITH PROPOFOL;  Surgeon: Lollie Sails, MD;  Location: [facility] ENDOSCOPY;  Service: Endoscopy;  Laterality: N/A;  . ESOPHAGOGASTRODUODENOSCOPY (EGD) WITH PROPOFOL N/A 10/22/2016   Procedure: ESOPHAGOGASTRODUODENOSCOPY (EGD) WITH PROPOFOL;  Surgeon: Lollie Sails, MD;  Location: [facility] ENDOSCOPY;  Service: Endoscopy;   Laterality: N/A;  . ESOPHAGOGASTRODUODENOSCOPY (EGD) WITH PROPOFOL N/A 07/14/2018   Procedure: ESOPHAGOGASTRODUODENOSCOPY (EGD) WITH PROPOFOL;  Surgeon: Lucilla Lame, MD;  Location: [facility] ENDOSCOPY;  Service: Endoscopy;  Laterality: N/A;  . FRACTURE SURGERY Left    arm  . SHOULDER ARTHROSCOPY WITH SUBACROMIAL DECOMPRESSION AND BICEP TENDON REPAIR Right 04/16/2018   Procedure: ARTHROSCOPIC  BICEP TENOTOMY;  Surgeon: Thornton Park, MD;  Location: [facility] ORS;  Service: Orthopedics;  Laterality: Right;  . UPPER ESOPHAGEAL ENDOSCOPIC ULTRASOUND (EUS)  6/15 ue to repeat in 1 year   Family History  Problem Relation Age of Onset  . Hypertension Father   . Heart attack Father   . Heart disease Sister   . Hypertension Sister    Social History   Socioeconomic History  . Marital status: Divorced    Spouse name: Not on file  . Number of children: 3  . Years of education: Not on file  . Highest education level: GED or equivalent  Occupational History  . Not on file  Tobacco Use  . Smoking status: Never Smoker  . Smokeless tobacco: Never Used  Substance and Sexual Activity  . Alcohol use: No  . Drug use: No  . Sexual activity: Not Currently  Other Topics Concern  . Not on file  Social History Narrative  . Not on file   Social Determinants of Health   Financial Resource Strain:   . Difficulty of Paying Living Expenses: Not on file  Food Insecurity: No Food Insecurity  . Worried About Charity fundraiser in the Last Year: Never true  . Ran Out of Food in the Last Year: Never true  Transportation Needs:   . Lack of Transportation (Medical): Not on file  . Lack of Transportation (Non-Medical): Not on file  Physical Activity:   . Days of Exercise per Week: Not on file  . Minutes of Exercise per Session: Not on file  Stress: No Stress Concern Present  . Feeling of Stress : Not at all  Social Connections:   . Frequency of Communication with Friends and Family: Not on file  . Frequency  of Social Gatherings with Friends and Family: Not on file  . Attends Religious Services: Not on file  . Active Member of Clubs or Organizations: Not on file  . Attends Archivist Meetings: Not on file  . Marital Status: Not on file    Outpatient Encounter Medications as of 06/22/2019  Medication Sig  . albuterol (PROAIR HFA) 108 (90 Base) MCG/ACT inhaler Inhale 2 puffs into the lungs every 4 (four) hours as needed for wheezing or shortness of breath.  Marland Kitchen atorvastatin (LIPITOR) 80 MG tablet Take 1 tablet (80 mg total) by mouth daily.  . Cholecalciferol (VITAMIN D3 PO) Take 1 capsule by mouth daily.  Marland Kitchen esomeprazole (NEXIUM) 40 MG capsule Take 40 mg by mouth daily.   Marland Kitchen ezetimibe (ZETIA) 10 MG tablet Take 1 tablet (10 mg total) by mouth daily.  . fluticasone (FLONASE) 50 MCG/ACT nasal spray Place 2 sprays into  both nostrils daily as needed.  Marland Kitchen glimepiride (AMARYL) 1 MG tablet Take 1 tablet by mouth daily.  Marland Kitchen glucose blood (ACCU-CHEK AVIVA PLUS) test strip Check fingerstick blood sugar once a day; LON 99 months, Dx E11.22  . Lancets (ACCU-CHEK MULTICLIX) lancets USE AS DIRECTED  . metFORMIN (GLUCOPHAGE) 1000 MG tablet Take 1 tablet (1,000 mg total) by mouth 2 (two) times daily with a meal.  . pioglitazone (ACTOS) 15 MG tablet TAKE 1 TABLET BY MOUTH ONCE DAILY -  STOP  AND  SEEK  MEDICAL  ATTENTION  IF  WEIGHT  GAIN,  LEG  SWELLING,  SHORTNESS  OF  BREATH (Patient not taking: Reported on 06/22/2019)   No facility-administered encounter medications on file as of 06/22/2019.    Activities of Daily Living In your present state of health, do you have any difficulty performing the following activities: 06/22/2019 03/26/2019  Hearing? Tempie Donning  Comment wears hearing aids -  Vision? N N  Difficulty concentrating or making decisions? N N  Walking or climbing stairs? N N  Dressing or bathing? N N  Doing errands, shopping? N N  Preparing Food and eating ? N -  Using the Toilet? N -  In the past six  months, have you accidently leaked urine? Y -  Comment occasionally at night -  Do you have problems with loss of bowel control? N -  Managing your Medications? N -  Managing your Finances? N -  Housekeeping or managing your Housekeeping? N -  Some recent data might be hidden    Patient Care Team: Delsa Grana, PA-C as PCP - General (Family Medicine) Lavonia Dana, MD as Consulting Physician (Internal Medicine) Lollie Sails, MD as Consulting Physician (Gastroenterology)   Assessment:   This is a routine wellness examination for [facility].  Exercise Activities and Dietary recommendations Current Exercise Habits: The patient does not participate in regular exercise at present, Exercise limited by: orthopedic condition(s)  Goals    . Have 3 meals a day     Recommend to eat 3 small healthy meals and 2 healthy snacks per day    . Increase physical activity     Increase physical activity to 150 minutes per week       Fall Risk Fall Risk  06/22/2019 03/26/2019 02/25/2019 12/22/2018 11/23/2018  Falls in the past year? 0 0 0 0 0  Number falls in past yr: 0 0 0 0 -  Injury with Fall? 0 0 0 0 -  Risk for fall due to : No Fall Risks - - - -  Follow up Falls prevention discussed - - - -   FALL RISK PREVENTION PERTAINING TO THE HOME:  Any stairs in or around the home? No  If so, do they handrails? No   Home free of loose throw rugs in walkways, pet beds, electrical cords, etc? Yes  Adequate lighting in your home to reduce risk of falls? Yes   ASSISTIVE DEVICES UTILIZED TO PREVENT FALLS:  Life alert? No  Use of a cane, walker or w/c? No  Grab bars in the bathroom? No  Shower chair or bench in shower? No  Elevated toilet seat or a handicapped toilet? No   DME ORDERS:  DME order needed?  No   TIMED UP AND GO:  Was the test performed? No . Telephonic visit.   Education: Fall risk prevention has been discussed.  Intervention(s) required? No   Depression Screen PHQ 2/9  Scores 06/22/2019 03/26/2019 02/25/2019 12/22/2018  PHQ - 2 Score 2 0 0 0  PHQ- 9 Score 4 0 0 0    Cognitive Function - PT declined 6CIT for 2020 AWV     6CIT Screen 06/19/2018 04/22/2017  What Year? 0 points 0 points  What month? 0 points 0 points  What time? 0 points 0 points  Count back from 20 0 points 0 points  Months in reverse 2 points 2 points  Repeat phrase 0 points 4 points  Total Score 2 6    Immunization History  Administered Date(s) Administered  . Fluad Quad(high Dose 65+) 02/25/2019  . Influenza, High Dose Seasonal PF 03/06/2018  . Influenza,inj,Quad PF,6+ Mos 02/18/2013  . Influenza-Unspecified 02/18/2013, 03/18/2015, 04/01/2017  . Pneumococcal Conjugate-13 04/08/2016, 03/06/2018  . Pneumococcal Polysaccharide-23 08/03/2012  . Tdap 04/03/2016    Qualifies for Shingles Vaccine? Yes . Due for Shingrix. Education has been provided regarding the importance of this vaccine. Pt has been advised to call insurance company to determine out of pocket expense. Advised may also receive vaccine at local pharmacy or Health Dept. Verbalized acceptance and understanding.  Tdap: Up to date  Flu Vaccine: Up to date  Pneumococcal Vaccine: Up to date   Screening Tests Health Maintenance  Topic Date Due  . FOOT EXAM  08/21/2019  . URINE MICROALBUMIN  08/21/2019  . HEMOGLOBIN A1C  08/25/2019  . OPHTHALMOLOGY EXAM  04/27/2020  . TETANUS/TDAP  04/03/2026  . COLONOSCOPY  02/07/2029  . INFLUENZA VACCINE  Completed  . Hepatitis C Screening  Completed  . PNA vac Low Risk Adult  Completed   Cancer Screenings:  Colorectal Screening: Completed 02/08/19. Repeat every 10 years;  Lung Cancer Screening: (Low Dose CT Chest recommended if Age 72-80 years, 30 pack-year currently smoking OR have quit w/in 15years.) does not qualify.    Additional Screening:  Hepatitis C Screening: does qualify; Completed 04/22/17  Vision Screening: Recommended annual ophthalmology exams for early  detection of glaucoma and other disorders of the eye. Is the patient up to date with their annual eye exam?  Yes  Who is the provider or what is the name of the office in which the pt attends annual eye exams? [place] Screening: Recommended annual dental exams for proper oral hygiene  Community Resource Referral:  CRR required this visit?  No       Plan:   I have personally reviewed and addressed the Medicare Annual Wellness questionnaire and have noted the following in the patient's chart:  A. Medical and social history B. Use of alcohol, tobacco or illicit drugs  C. Current medications and supplements D. Functional ability and status E.  Nutritional status F.  Physical activity G. Advance directives H. List of other physicians I.  Hospitalizations, surgeries, and ER visits in previous 12 months J.  Ali Molina such as hearing and vision if needed, cognitive and depression L. Referrals and appointments   In addition, I have reviewed and discussed with patient certain preventive protocols, quality metrics, and best practice recommendations. A written personalized care plan for preventive services as well as general preventive health recommendations were provided to patient.   Signed,  Clemetine Marker, LPN Nurse Health Advisor   Nurse Notes: pt has follow up appt with Delsa Grana PAC on 06/29/19

## 2019-06-24 ENCOUNTER — Ambulatory Visit: Payer: Medicare Other

## 2019-06-24 ENCOUNTER — Other Ambulatory Visit: Payer: Self-pay

## 2019-06-24 DIAGNOSIS — E1122 Type 2 diabetes mellitus with diabetic chronic kidney disease: Secondary | ICD-10-CM

## 2019-06-24 DIAGNOSIS — E782 Mixed hyperlipidemia: Secondary | ICD-10-CM

## 2019-06-24 DIAGNOSIS — IMO0002 Reserved for concepts with insufficient information to code with codable children: Secondary | ICD-10-CM

## 2019-06-28 MED ORDER — METFORMIN HCL 1000 MG PO TABS: 1000.0000 mg | ORAL_TABLET | Freq: Two times a day (BID) | ORAL | 1 refills | Status: DC

## 2019-06-28 MED ORDER — ATORVASTATIN CALCIUM 80 MG PO TABS: 80.0000 mg | ORAL_TABLET | Freq: Every day | ORAL | 1 refills | Status: DC

## 2019-06-29 ENCOUNTER — Ambulatory Visit (INDEPENDENT_AMBULATORY_CARE_PROVIDER_SITE_OTHER): Payer: Medicare Other | Admitting: Family Medicine

## 2019-06-29 ENCOUNTER — Encounter: Payer: Self-pay | Admitting: Family Medicine

## 2019-06-29 ENCOUNTER — Other Ambulatory Visit: Payer: Self-pay

## 2019-06-29 VITALS — BP 130/78 | HR 74 | Temp 96.8°F | Resp 14 | Ht 72.0 in | Wt 190.0 lb

## 2019-06-29 DIAGNOSIS — E782 Mixed hyperlipidemia: Secondary | ICD-10-CM

## 2019-06-29 DIAGNOSIS — D649 Anemia, unspecified: Secondary | ICD-10-CM

## 2019-06-29 DIAGNOSIS — Z1331 Encounter for screening for depression: Secondary | ICD-10-CM

## 2019-06-29 DIAGNOSIS — E1121 Type 2 diabetes mellitus with diabetic nephropathy: Secondary | ICD-10-CM

## 2019-06-29 DIAGNOSIS — I10 Essential (primary) hypertension: Secondary | ICD-10-CM

## 2019-06-29 DIAGNOSIS — K2100 Gastro-esophageal reflux disease with esophagitis, without bleeding: Secondary | ICD-10-CM

## 2019-06-29 DIAGNOSIS — F321 Major depressive disorder, single episode, moderate: Secondary | ICD-10-CM

## 2019-06-29 DIAGNOSIS — R002 Palpitations: Secondary | ICD-10-CM

## 2019-06-29 LAB — POCT UA - MICROALBUMIN: Microalbumin Ur, POC: 20 mg/L

## 2019-06-29 MED ORDER — GLIMEPIRIDE 1 MG PO TABS: 1.0000 mg | ORAL_TABLET | Freq: Every day | ORAL | 1 refills | Status: DC

## 2019-06-29 NOTE — Progress Notes (Signed)
Name: Paul Spears   MRN: [id number]    DOB: [birth year]   Date:06/29/2019       Progress Note  Chief Complaint  Patient presents with  . Follow-up  . Hypertension  . Diabetes  . Medication Refill  . Palpitations    sometimes wakes up at 3am with heart racing     Subjective:   Paul Spears is a 74 y.o. male, presents to clinic for routine follow up on the conditions listed above.  DM doing well with same meds, stable, morning sugars run around 100, he's been down a little lately with COVID and not being able to go anywhere or do anything, and he's been eating more ice cream and fries - comfort food - and numbers have been a little higher a few times, but overall most fasting CBGs close to 100 DM managed with actos, metformin and amaryl, he denies hypoglycemia Last A1C was 7.4, he denies any hypoglycemia, neuropathy, polyuria, polydipsia, nocturia, unintentional weight loss  HTN - BP well controlled, stable, managed with diet/lifestyle.   No lightheadedness, hypotension, syncope. Some racing heart episodes, occur in the middle of the night or in the morning EKG done here today is NSR, Pt does not want to be referred to cardiology right - discussed holter monitor or other work up and he said he's done that before and will wait for now. Blood pressure today is well controlled. BP Readings from Last 3 Encounters:  06/29/19 130/78  03/29/19 122/64  03/26/19 104/62   Hyperlipidemia: Current Medication Regimen:  lipitor 80 mg and zetia  Last Lipids: Lab Results  Component Value Date   CHOL 93 02/25/2019   HDL 39 (L) 02/25/2019   LDLCALC 41 02/25/2019   TRIG 54 02/25/2019   CHOLHDL 2.4 02/25/2019  - Current Diet:  Generally healthy, but cheating with snacks and sweets lately - Denies: Chest pain, shortness of breath, myalgias.  Last TSH was normal 1.33 Mild anemia hgb was 12.5 on 02/25/2019 and 12.9 on 03/26/2019 with negative stool cards x 3  Prior to me coming into the  exam room today patient complained about palpitations to the nurse they did a EKG. he tells me that he has had this before he has had Holter monitors and work-up by cardiology many times in the past.  He states that he just feels palpitations sometimes when he is going to bed or lying down at night.  When we discussed going to cardiology to recheck with a monitor patient stated he did not want to go right now and it is fine, stated it might be related to how his moods have been.  He denies any lower extremity edema, orthopnea, exertional dyspnea, chest pain, near syncope  Patient Active Problem List   Diagnosis Date Noted  . Iron deficiency anemia 11/11/2017  . DDD (degenerative disc disease), cervical 12/07/2015  . GERD with esophagitis 09/21/2014  . Barrett esophagus 09/21/2014  . Cervical osteoarthritis 09/21/2014  . Chronic kidney disease (CKD), stage III (moderate) 09/21/2014  . Controlled type 2 diabetes mellitus with diabetic nephropathy ([place]) 09/21/2014  . Microalbuminuria 09/21/2014  . Osteopenia 09/21/2014  . HTN (hypertension) 05/20/2014  . Hyperlipidemia 05/20/2014  . Benign hypertensive kidney disease with chronic kidney disease 05/20/2014    Past Surgical History:  Procedure Laterality Date  . CARPAL TUNNEL RELEASE Right   . COLONOSCOPY  10/26/2012   repeat in 10 years MUS  . COLONOSCOPY WITH PROPOFOL N/A 02/08/2019   Procedure: COLONOSCOPY WITH  PROPOFOL;  Surgeon: Lollie Sails, MD;  Location[facility] ENDOSCOPY;  Service: Endoscopy;  Laterality: N/A;  . ESOPHAGOGASTRODUODENOSCOPY (EGD) WITH PROPOFOL N/A 10/22/2016   Procedure: ESOPHAGOGASTRODUODENOSCOPY (EGD) WITH PROPOFOL;  Surgeon: Lollie Sails, MD;  Location: [facility] ENDOSCOPY;  Service: Endoscopy;  Laterality: N/A;  . ESOPHAGOGASTRODUODENOSCOPY (EGD) WITH PROPOFOL N/A 07/14/2018   Procedure: ESOPHAGOGASTRODUODENOSCOPY (EGD) WITH PROPOFOL;  Surgeon: Lucilla Lame, MD;  Location: [facility] ENDOSCOPY;  Service: Endoscopy;   Laterality: N/A;  . FRACTURE SURGERY Left    arm  . SHOULDER ARTHROSCOPY WITH SUBACROMIAL DECOMPRESSION AND BICEP TENDON REPAIR Right 04/16/2018   Procedure: ARTHROSCOPIC  BICEP TENOTOMY;  Surgeon: Thornton Park, MD;  Location: [facility] ORS;  Service: Orthopedics;  Laterality: Right;  . UPPER ESOPHAGEAL ENDOSCOPIC ULTRASOUND (EUS)  6/15 ue to repeat in 1 year    Family History  Problem Relation Age of Onset  . Hypertension Father   . Heart attack Father   . Heart disease Sister   . Hypertension Sister     Social History   Socioeconomic History  . Marital status: Divorced    Spouse name: Not on file  . Number of children: 3  . Years of education: Not on file  . Highest education level: GED or equivalent  Occupational History  . Not on file  Tobacco Use  . Smoking status: Never Smoker  . Smokeless tobacco: Never Used  Substance and Sexual Activity  . Alcohol use: No  . Drug use: No  . Sexual activity: Not Currently  Other Topics Concern  . Not on file  Social History Narrative  . Not on file   Social Determinants of Health   Financial Resource Strain:   . Difficulty of Paying Living Expenses: Not on file  Food Insecurity: No Food Insecurity  . Worried About Charity fundraiser in the Last Year: Never true  . Ran Out of Food in the Last Year: Never true  Transportation Needs:   . Lack of Transportation (Medical): Not on file  . Lack of Transportation (Non-Medical): Not on file  Physical Activity:   . Days of Exercise per Week: Not on file  . Minutes of Exercise per Session: Not on file  Stress: No Stress Concern Present  . Feeling of Stress : Not at all  Social Connections:   . Frequency of Communication with Friends and Family: Not on file  . Frequency of Social Gatherings with Friends and Family: Not on file  . Attends Religious Services: Not on file  . Active Member of Clubs or Organizations: Not on file  . Attends Archivist Meetings: Not on file    . Marital Status: Not on file  Intimate Partner Violence: Not At Risk  . Fear of Current or Ex-Partner: No  . Emotionally Abused: No  . Physically Abused: No  . Sexually Abused: No     Current Outpatient Medications:  .  albuterol (PROAIR HFA) 108 (90 Base) MCG/ACT inhaler, Inhale 2 puffs into the lungs every 4 (four) hours as needed for wheezing or shortness of breath., Disp: 1 Inhaler, Rfl: 1 .  atorvastatin (LIPITOR) 80 MG tablet, Take 1 tablet (80 mg total) by mouth daily., Disp: 90 tablet, Rfl: 1 .  esomeprazole (NEXIUM) 40 MG capsule, Take 40 mg by mouth daily. , Disp: , Rfl:  .  ezetimibe (ZETIA) 10 MG tablet, Take 1 tablet (10 mg total) by mouth daily., Disp: 90 tablet, Rfl: 1 .  fluticasone (FLONASE) 50 MCG/ACT nasal spray, Place 2  sprays into both nostrils daily as needed., Disp: 16 g, Rfl: 11 .  glimepiride (AMARYL) 1 MG tablet, Take 1 tablet by mouth daily., Disp: , Rfl:  .  glucose blood (ACCU-CHEK AVIVA PLUS) test strip, Check fingerstick blood sugar once a day; LON 99 months, Dx E11.22, Disp: 100 each, Rfl: 3 .  Lancets (ACCU-CHEK MULTICLIX) lancets, USE AS DIRECTED, Disp: 102 each, Rfl: 12 .  metFORMIN (GLUCOPHAGE) 1000 MG tablet, Take 1 tablet (1,000 mg total) by mouth 2 (two) times daily with a meal., Disp: 180 tablet, Rfl: 1 .  pioglitazone (ACTOS) 15 MG tablet, TAKE 1 TABLET BY MOUTH ONCE DAILY -  STOP  AND  SEEK  MEDICAL  ATTENTION  IF  WEIGHT  GAIN,  LEG  SWELLING,  SHORTNESS  OF  BREATH, Disp: 90 tablet, Rfl: 0 .  Cholecalciferol (VITAMIN D3 PO), Take 1 capsule by mouth daily., Disp: , Rfl:   Allergies  Allergen Reactions  . Losartan Potassium Other (See Comments)    hyperkalemia    Chart Review Today: I personally reviewed active problem list, medication list, allergies, family history, social history, health maintenance, notes from last encounter, lab results, imaging with the patient/caregiver today.   Review of Systems  Constitutional: Negative.   HENT:  Negative.   Eyes: Negative.   Respiratory: Negative.   Cardiovascular: Positive for palpitations. Negative for chest pain and leg swelling.  Gastrointestinal: Negative.   Endocrine: Negative.   Genitourinary: Negative.   Musculoskeletal: Negative.   Skin: Negative.   Allergic/Immunologic: Negative.   Neurological: Negative.   Hematological: Negative.   Psychiatric/Behavioral: Negative.   All other systems reviewed and are negative.    Objective:    Vitals:   06/29/19 0759  BP: 130/78  Pulse: 74  Resp: 14  Temp: (!) 96.8 F (36 C)  TempSrc: Temporal  SpO2: 99%  Weight: 190 lb (86.2 kg)  Height: 6' (1.829 m)    Body mass index is 25.77 kg/m.  Physical Exam Vitals and nursing note reviewed.  Constitutional:      General: He is not in acute distress.    Appearance: Normal appearance. He is well-developed. He is not ill-appearing, toxic-appearing or diaphoretic.     Interventions: Face mask in place.  HENT:     Head: Normocephalic and atraumatic.     Jaw: No trismus.     Right Ear: External ear normal.     Left Ear: External ear normal.  Eyes:     General: Lids are normal. No scleral icterus.    Conjunctiva/sclera: Conjunctivae normal.     Pupils: Pupils are equal, round, and reactive to light.  Neck:     Trachea: Trachea and phonation normal. No tracheal deviation.  Cardiovascular:     Rate and Rhythm: Normal rate and regular rhythm.  No extrasystoles are present.    Chest Wall: PMI is not displaced.     Pulses: Normal pulses.          Radial pulses are 2+ on the right side and 2+ on the left side.       Posterior tibial pulses are 2+ on the right side and 2+ on the left side.     Heart sounds: Normal heart sounds. No murmur. No friction rub. No gallop.   Pulmonary:     Effort: Pulmonary effort is normal. No respiratory distress.     Breath sounds: Normal breath sounds. No stridor. No wheezing, rhonchi or rales.  Abdominal:     General: Bowel  sounds are  normal. There is no distension.     Palpations: Abdomen is soft.     Tenderness: There is no abdominal tenderness. There is no guarding or rebound.  Musculoskeletal:        General: Normal range of motion.     Cervical back: Normal range of motion and neck supple.     Right lower leg: No edema.     Left lower leg: No edema.  Skin:    General: Skin is warm and dry.     Capillary Refill: Capillary refill takes less than 2 seconds.     Coloration: Skin is pale (chronically pale appearing, slightly better appearing than last couple visits). Skin is not jaundiced.     Findings: No rash.     Nails: There is no clubbing.  Neurological:     Mental Status: He is alert.     Cranial Nerves: No dysarthria or facial asymmetry.     Motor: No tremor or abnormal muscle tone.     Gait: Gait normal.  Psychiatric:        Mood and Affect: Mood normal.        Speech: Speech normal.        Behavior: Behavior normal. Behavior is cooperative.       Diabetic Foot Exam: Diabetic Foot Exam - Simple   Simple Foot Form Diabetic Foot exam was performed with the following findings: Yes 06/29/2019  8:30 AM  Visual Inspection Sensation Testing Pulse Check Comments     PHQ2/9: Depression screen [facility] 2/9 06/29/2019 06/22/2019 03/26/2019 02/25/2019 12/22/2018  Decreased Interest 1 1 0 0 0  Down, Depressed, Hopeless 1 1 0 0 0  PHQ - 2 Score 2 2 0 0 0  Altered sleeping 0 0 0 0 0  Tired, decreased energy 2 1 0 0 0  Change in appetite 2 0 0 0 0  Feeling bad or failure about yourself  1 1 0 0 0  Trouble concentrating 1 0 0 0 0  Moving slowly or fidgety/restless 1 0 0 0 0  Suicidal thoughts 1 0 0 0 0  PHQ-9 Score 10 4 0 0 0  Difficult doing work/chores Somewhat difficult Not difficult at all Not difficult at all Not difficult at all Not difficult at all  Some recent data might be hidden    phq 9 is positive, discussed with pt today, pt frustrated with COVID pandemic, did not want referrals, denied SI to me  although marked on his form  Fall Risk: Fall Risk  06/29/2019 06/22/2019 03/26/2019 02/25/2019 12/22/2018  Falls in the past year? 0 0 0 0 0  Number falls in past yr: 0 0 0 0 0  Injury with Fall? 0 0 0 0 0  Risk for fall due to : - No Fall Risks - - -  Follow up - Falls prevention discussed - - -    Functional Status Survey: Is the patient deaf or have difficulty hearing?: Yes(wears hearing aides) Does the patient have difficulty seeing, even when wearing glasses/contacts?: No Does the patient have difficulty concentrating, remembering, or making decisions?: Yes Does the patient have difficulty walking or climbing stairs?: No Does the patient have difficulty dressing or bathing?: No Does the patient have difficulty doing errands alone such as visiting a doctor's office or shopping?: No   Assessment & Plan:     ICD-10-CM   1. Palpitations  R00.2 EKG 12-Lead    CMP w GFR    TSH  Mag    CBC +diff and smear    Iron, TIBC and Ferritin Panel   EKG unremarkable, pt refused cardiology referral, may be related to his moods and anxiety  2. Gastroesophageal reflux disease with esophagitis, unspecified whether hemorrhage  K21.00    per GI - pt had recent f/up, denies melena, hematochezia or abdominal pain  3. Mixed hyperlipidemia  E78.2 CMP w GFR    Lipid Panel   Compliant, has been stable, no new symptoms, will recheck labs  4. Essential hypertension  I10 CMP w GFR   Well-controlled, stable  5. Controlled type 2 diabetes mellitus with diabetic nephropathy, without long-term current use of insulin (HCC)  E11.21 glimepiride (AMARYL) 1 MG tablet    CMP w GFR    A1C    POCT UA - Microalbumin   Unstable has not yet gotten to goal of A1c under 7, patient admits worse diet sweets and ice cream with depressed mood, foot exam done, check labs  6. Anemia, unspecified type  D64.9 CBC +diff and smear    Iron, TIBC and Ferritin Panel   has seen GI recently, recheck his labs and iron to see if he needs  supplements  7. Positive depression screening  Z13.31   8. Current moderate episode of major depressive disorder, unspecified whether recurrent ([place])  F32.1   Will see if pt can come back sooner than his next routine visit to f/up with him more closely about mood.  He is very hesitant to do anything about some of his sx and his mood, may be a good option to see if he will try some medications like citalopram?      Return for 4 month f/up.   Delsa Grana, PA-C 06/29/19 8:34 AM

## 2019-06-29 NOTE — Patient Instructions (Signed)
Come back do your labs, any week day 8-12 or 2-4  Thank you for your request for information about the COVID vaccine We strongly believe that every patient should get vaccinated who is able to. Please reference the [place] department of health and human services website for up to date information about vaccine roll out decisions and availablility.   You may also reference [place] website for up to date local information regarding vaccine availability in our communities  [email or web address]    Department of Health and Human Services  Phase 1a  June 08, 2019  [email or web address]  COVID-19 Vaccinations: Slow the spread and save lives.  A tested, safe and effective vaccine will be available to all who want it, but supplies will be limited at first. Independent state and federal public health advisory committees have determined that the best way to fight COVID-19 is to start first with vaccinations for those most at risk, reaching more people as the vaccine supply increases.  Keep practicing the 3W's--wear a mask, wait six feet apart, wash your hands--until everyone has a chance to be vaccinated.  PHASE 1a:  HEALTH CARE WORKERS FIGHTING COVID-19 & LONG-TERM CARE  The goal is to protect the health care workers who care for patients with COVID-19, those working on the vaccination rollout, and [employer] who are at the highest risk of being hospitalized or dying from COVID-19.   Health care workers at high risk for exposure to COVID-19 are defined as those: . caring for patients with COVID-19 . working directly in areas where patients with COVID-19 are cared for, including staff responsible for cleaning, providing food service, and maintenance in those areas . performing procedures on patients with COVID-19 that put them at risk, such as intubation, bronchoscopy,  suctioning, invasive dental procedures, invasive specimen collection, and CPR . handling people for who have died from [place] include people and staff in skilled nursing facilities and in adult, family and group homes: . adult care homes . family care homes . group homes . skilled nursing facilities . group homes for people with intellectual and developmental disabilities who receive home or communitybased services . in-patient hospice facilities

## 2019-07-01 LAB — COMPLETE METABOLIC PANEL WITH GFR
AG Ratio: 2.3 (calc) (ref 1.0–2.5)
ALT: 14 U/L (ref 9–46)
AST: 17 U/L (ref 10–35)
Albumin: 4.4 g/dL (ref 3.6–5.1)
Alkaline phosphatase (APISO): 84 U/L (ref 35–144)
BUN/Creatinine Ratio: 12 (calc) (ref 6–22)
BUN: 15 mg/dL (ref 7–25)
CO2: 27 mmol/L (ref 20–32)
Calcium: 9.5 mg/dL (ref 8.6–10.3)
Chloride: 105 mmol/L (ref 98–110)
Creat: 1.25 mg/dL — ABNORMAL HIGH (ref 0.70–1.18)
GFR, Est African American: 66 mL/min/{1.73_m2} (ref >=60)
GFR, Est Non African American: 57 mL/min/{1.73_m2} — ABNORMAL LOW (ref >=60)
Globulin: 1.9 g/dL (calc) (ref 1.9–3.7)
Glucose, Bld: 116 mg/dL — ABNORMAL HIGH (ref 65–99)
Potassium: 4.5 mmol/L (ref 3.5–5.3)
Sodium: 139 mmol/L (ref 135–146)
Total Bilirubin: 0.8 mg/dL (ref 0.2–1.2)
Total Protein: 6.3 g/dL (ref 6.1–8.1)

## 2019-07-01 LAB — CBC (INCLUDES DIFF/PLT) WITH PATHOLOGIST REVIEW
Absolute Monocytes: 434 cells/uL (ref 200–950)
Basophils Absolute: 69 cells/uL (ref 0–200)
Basophils Relative: 1.6 %
Eosinophils Absolute: 314 cells/uL (ref 15–500)
Eosinophils Relative: 7.3 %
HCT: 37.3 % — ABNORMAL LOW (ref 38.5–50.0)
Hemoglobin: 12 g/dL — ABNORMAL LOW (ref 13.2–17.1)
Lymphs Abs: 1694 cells/uL (ref 850–3900)
MCH: 29.9 pg (ref 27.0–33.0)
MCHC: 32.2 g/dL (ref 32.0–36.0)
MCV: 93 fL (ref 80.0–100.0)
MPV: 9.5 fL (ref 7.5–12.5)
Monocytes Relative: 10.1 %
Neutro Abs: 1789 cells/uL (ref 1500–7800)
Neutrophils Relative %: 41.6 %
Platelets: 315 10*3/uL (ref 140–400)
RBC: 4.01 10*6/uL — ABNORMAL LOW (ref 4.20–5.80)
RDW: 13.8 % (ref 11.0–15.0)
Total Lymphocyte: 39.4 %
WBC: 4.3 10*3/uL (ref 3.8–10.8)

## 2019-07-01 LAB — LIPID PANEL
Cholesterol: 157 mg/dL (ref <200)
HDL: 52 mg/dL (ref >=40)
LDL Cholesterol (Calc): 89 mg/dL (calc)
Non-HDL Cholesterol (Calc): 105 mg/dL (calc) (ref <130)
Total CHOL/HDL Ratio: 3 (calc) (ref <5.0)
Triglycerides: 74 mg/dL (ref <150)

## 2019-07-01 LAB — HEMOGLOBIN A1C
Hgb A1c MFr Bld: 8.7 % of total Hgb — ABNORMAL HIGH (ref <5.7)
Mean Plasma Glucose: 203 (calc)
eAG (mmol/L): 11.2 (calc)

## 2019-07-01 LAB — IRON,TIBC AND FERRITIN PANEL
%SAT: 31 % (calc) (ref 20–48)
Ferritin: 27 ng/mL (ref 24–380)
Iron: 111 ug/dL (ref 50–180)
TIBC: 362 mcg/dL (calc) (ref 250–425)

## 2019-07-01 LAB — MAGNESIUM: Magnesium: 1.7 mg/dL (ref 1.5–2.5)

## 2019-07-01 LAB — TSH: TSH: 1.34 mIU/L (ref 0.40–4.50)

## 2019-08-03 ENCOUNTER — Other Ambulatory Visit: Payer: Self-pay | Admitting: Family Medicine

## 2019-08-03 DIAGNOSIS — E1122 Type 2 diabetes mellitus with diabetic chronic kidney disease: Secondary | ICD-10-CM

## 2019-08-03 DIAGNOSIS — IMO0002 Reserved for concepts with insufficient information to code with codable children: Secondary | ICD-10-CM

## 2019-08-03 NOTE — Telephone Encounter (Signed)
Requested medication (s) are due for refill today yes  Requested medication (s) are on the active medication list -yes  Future visit scheduled yes  Last refill: 06/01/18- 3 RF  Notes to clinic: Patient passes protocol for refill- please change PCP on new Rx.  Requested Prescriptions  Pending Prescriptions Disp Refills   ACCU-CHEK AVIVA PLUS test strip [Pharmacy Med Name: Accu-Chek Aviva Plus In Vitro Strip] 20 each 0    Sig: USE 1 STRIP TO [place] DAILY      Endocrinology: Diabetes - Testing Supplies Passed - 08/03/2019 10:27 AM      Passed - Valid encounter within last 12 months    Recent Outpatient Visits           1 month ago [facility] Delsa Grana, PA-C   4 months ago Essential hypertension   [facility] [place], [place], PA-C   4 months ago [facility] Arjay, Kristeen Miss, PA-C   5 months ago Controlled type 2 diabetes mellitus with diabetic nephropathy, without long-term current use of insulin [facility])   [facility] Delsa Grana, PA-C   7 months ago Controlled type 2 diabetes mellitus with diabetic nephropathy, without long-term current use of insulin [facility])   [place], Bethel Born, NP       Future Appointments             In 2 months Delsa Grana, PA-C [facility], [place]   In 10 months  [facility] [place] Prescriptions  Pending Prescriptions Disp Refills   ACCU-CHEK AVIVA PLUS test strip [Pharmacy Med Name: Accu-Chek Aviva Plus In Vitro Strip] 81 each 0    Sig: USE 1 STRIP TO [place] DAILY      Endocrinology: Diabetes - Testing Supplies Passed - 08/03/2019 10:27 AM      Passed - Valid encounter within last 12 months    Recent Outpatient Visits           1 month ago [facility] Delsa Grana, PA-C   4 months ago  Essential hypertension   [facility] Stirling, [place], PA-C   4 months ago [facility] [place], Kristeen Miss, PA-C   5 months ago Controlled type 2 diabetes mellitus with diabetic nephropathy, without long-term current use of insulin [facility])   [facility] Delsa Grana, PA-C   7 months ago Controlled type 2 diabetes mellitus with diabetic nephropathy, without long-term current use of insulin [facility])   [place], NP       Future Appointments             In 2 months Delsa Grana, PA-C [facility], [place]   In 10 months  [facility], [facility]

## 2019-08-12 ENCOUNTER — Ambulatory Visit: Payer: Medicare Other | Attending: Internal Medicine

## 2019-08-12 DIAGNOSIS — Z23 Encounter for immunization: Secondary | ICD-10-CM | POA: Insufficient documentation

## 2019-08-12 NOTE — Progress Notes (Signed)
   Covid-19 Vaccination Clinic  Name:  Paul Spears    MRN: [id number] DOB: 11/26/1945  08/12/2019  [name] was observed post Covid-19 immunization for 15 minutes without incidence. He was provided with Vaccine Information Sheet and instruction to access the V-Safe system.   [name] was instructed to call 911 with any severe reactions post vaccine: Marland Kitchen Difficulty breathing  . Swelling of your face and throat  . A fast heartbeat  . A bad rash all over your body  . Dizziness and weakness    Immunizations Administered    Name Date Dose VIS Date Route   Pfizer COVID-19 Vaccine 08/12/2019  8:58 AM 0.3 mL 05/28/2019 Intramuscular   Manufacturer: [place]   Lot: [id number]   [place]: [id number]

## 2019-08-15 IMAGING — NM NUCLEAR MEDICINE GASTRIC EMPTYING STUDY
1 series · 10 of 10 positions shown · non-contrast
Comparison: None

CLINICAL DATA: Epigastric pain for the past few months, "feels like
food sits in my stomach for 24 hours", history diabetes mellitus

EXAM:
NUCLEAR MEDICINE GASTRIC EMPTYING SCAN
TECHNIQUE: After oral ingestion of radiolabeled meal, sequential abdominal
images were obtained for 4 hours. Percentage of activity emptying
the stomach was calculated at 1 hour, 2 hour, 3 hour, and 4 hours.
RADIOPHARMACEUTICALS:  2.8 mCi Lc-00m sulfur colloid in standardized
meal

[Series 1000: gatric statics (results) · 3.90mm/px · 5 acquisitions, 10 frames shown]
[im 1/5]
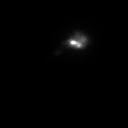
[im 1/5]
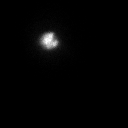
[im 2/5]
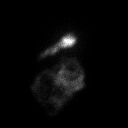
[im 2/5]
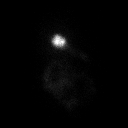
[im 3/5]
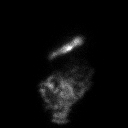
[im 3/5]
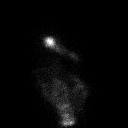
[im 4/5]
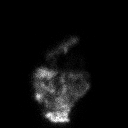
[im 4/5]
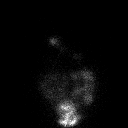
[im 5/5]
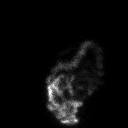
[im 5/5]
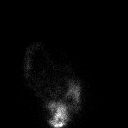

[10 of 10 positions shown; findings below may reference images not displayed]

FINDINGS: Expected location of the stomach in the left upper quadrant.

Ingested meal empties the stomach gradually over the course of the
study.

53% emptied at 1 hr ( normal >= 10%)

73% emptied at 2 hr ( normal >= 40%)

92% emptied at 3 hr ( normal >= 70%)

99% emptied at 4 hr ( normal >= 90%)
IMPRESSION: Normal gastric emptying study.

## 2019-08-23 DIAGNOSIS — N189 Chronic kidney disease, unspecified: Secondary | ICD-10-CM | POA: Insufficient documentation

## 2019-08-24 ENCOUNTER — Other Ambulatory Visit: Payer: Self-pay

## 2019-08-24 DIAGNOSIS — E782 Mixed hyperlipidemia: Secondary | ICD-10-CM

## 2019-08-24 MED ORDER — EZETIMIBE 10 MG PO TABS: 10.0000 mg | ORAL_TABLET | Freq: Every day | ORAL | 3 refills | Status: DC

## 2019-08-24 NOTE — Telephone Encounter (Signed)
LOV:06/29/19   Ref Range & Units 1 mo ago 6 mo ago  Cholesterol <200 mg/dL 157  93   HDL > OR = 40 mg/dL 52  39Low    Triglycerides <150 mg/dL 74  54   LDL Cholesterol (Calc) mg/dL (calc) 89

## 2019-09-07 ENCOUNTER — Ambulatory Visit: Payer: Medicare Other | Attending: Internal Medicine

## 2019-09-07 DIAGNOSIS — Z23 Encounter for immunization: Secondary | ICD-10-CM

## 2019-09-07 NOTE — Progress Notes (Signed)
   Covid-19 Vaccination Clinic  Name:  ABHIJOT ARMACOST    MRN: [id number] DOB: 1945/07/18  09/07/2019  Mr. Heyser was observed post Covid-19 immunization for 15 minutes without incident. He was provided with Vaccine Information Sheet and instruction to access the V-Safe system.   Mr. Shakur was instructed to call 911 with any severe reactions post vaccine: Marland Kitchen Difficulty breathing  . Swelling of face and throat  . A fast heartbeat  . A bad rash all over body  . Dizziness and weakness   Immunizations Administered    Name Date Dose VIS Date Route   Pfizer COVID-19 Vaccine 09/07/2019  8:11 AM 0.3 mL 05/28/2019 Intramuscular   Manufacturer: [place]   Lot: [id number]   [place]: [id number]

## 2019-09-24 ENCOUNTER — Other Ambulatory Visit: Payer: Self-pay | Admitting: Family Medicine

## 2019-09-24 DIAGNOSIS — M65341 Trigger finger, right ring finger: Secondary | ICD-10-CM | POA: Insufficient documentation

## 2019-10-27 ENCOUNTER — Other Ambulatory Visit: Payer: Self-pay

## 2019-10-27 ENCOUNTER — Encounter: Payer: Self-pay | Admitting: Family Medicine

## 2019-10-27 ENCOUNTER — Ambulatory Visit (INDEPENDENT_AMBULATORY_CARE_PROVIDER_SITE_OTHER): Payer: Medicare Other | Admitting: Family Medicine

## 2019-10-27 VITALS — BP 132/80 | HR 89 | Temp 97.9°F | Resp 16 | Ht 71.0 in | Wt 200.5 lb

## 2019-10-27 DIAGNOSIS — Z5181 Encounter for therapeutic drug level monitoring: Secondary | ICD-10-CM

## 2019-10-27 DIAGNOSIS — E782 Mixed hyperlipidemia: Secondary | ICD-10-CM

## 2019-10-27 DIAGNOSIS — D509 Iron deficiency anemia, unspecified: Secondary | ICD-10-CM | POA: Diagnosis not present

## 2019-10-27 DIAGNOSIS — K2100 Gastro-esophageal reflux disease with esophagitis, without bleeding: Secondary | ICD-10-CM | POA: Diagnosis not present

## 2019-10-27 DIAGNOSIS — E1165 Type 2 diabetes mellitus with hyperglycemia: Secondary | ICD-10-CM

## 2019-10-27 DIAGNOSIS — I10 Essential (primary) hypertension: Secondary | ICD-10-CM

## 2019-10-27 DIAGNOSIS — J45909 Unspecified asthma, uncomplicated: Secondary | ICD-10-CM

## 2019-10-27 DIAGNOSIS — Z9109 Other allergy status, other than to drugs and biological substances: Secondary | ICD-10-CM

## 2019-10-27 MED ORDER — FLUTICASONE PROPIONATE 50 MCG/ACT NA SUSP: 2.0000 | Freq: Every day | NASAL | 11 refills | Status: DC | PRN

## 2019-10-27 MED ORDER — LEVOCETIRIZINE DIHYDROCHLORIDE 5 MG PO TABS: 5.0000 mg | ORAL_TABLET | Freq: Every evening | ORAL | 2 refills | Status: DC

## 2019-10-27 MED ORDER — ALBUTEROL SULFATE HFA 108 (90 BASE) MCG/ACT IN AERS: 2.0000 | INHALATION_SPRAY | RESPIRATORY_TRACT | 0 refills | Status: DC | PRN

## 2019-10-27 NOTE — Progress Notes (Signed)
Name: Paul Spears   MRN: [id number]    DOB: Sep 17, 1945   Date:10/27/2019       Progress Note  Chief Complaint  Patient presents with  . Follow-up  . Diabetes  . Hypertension  . Hyperlipidemia  . Anemia  . Gastroesophageal Reflux     Subjective:   Paul Spears is a 74 y.o. male, presents to clinic for routine follow up on the conditions listed above.  Diabetes Mellitus Type II: Currently managing with metformin 1000 mg bid, actos glimepiride  Pt notes good med compliance Pt has no SE from meds. Fasting CBGs typically run 130 Recent high CBG 400's (with steroids from ortho) and low CBG 70-80 last week No hypoglycemic episodes Denies: Polyuria, polydipsia, polyphagia, vision changes, or neuropathy Recent pertinent labs: Lab Results  Component Value Date   HGBA1C 8.7 (H) 06/30/2019   HGBA1C 7.4 (A) 02/25/2019   HGBA1C 6.3 (H) 11/23/2018    Pt is utd on DM foot exam and eye exam ACEI/ARB: No - held due to hypotension Statin: Yes  HLD - on lipitor 80 mg and zetia no myalgia  Anemia-  Lab Results  Component Value Date   WBC 4.3 06/30/2019   HGB 12.0 (L) 06/30/2019   HCT 37.3 (L) 06/30/2019   MCV 93.0 06/30/2019   PLT 315 06/30/2019   GERD/barrets esophagus - well controlled with nexium Has used goody powders for hip pain recently No melena, no hematochezia   Asks for something stronger than albuterol inhaler, states that in the past when its raining he has trouble breathging and scratchy throat  Pt feels tired  His hip has "gone out on him"     Patient Active Problem List   Diagnosis Date Noted  . Iron deficiency anemia 11/11/2017  . DDD (degenerative disc disease), cervical 12/07/2015  . GERD with esophagitis 09/21/2014  . Barrett esophagus 09/21/2014  . Cervical osteoarthritis 09/21/2014  . Chronic kidney disease (CKD), stage III (moderate) 09/21/2014  . Controlled type 2 diabetes mellitus with diabetic nephropathy ([place]) 09/21/2014  .  Microalbuminuria 09/21/2014  . Osteopenia 09/21/2014  . HTN (hypertension) 05/20/2014  . Hyperlipidemia 05/20/2014  . Benign hypertensive kidney disease with chronic kidney disease 05/20/2014    Past Surgical History:  Procedure Laterality Date  . CARPAL TUNNEL RELEASE Right   . COLONOSCOPY  10/26/2012   repeat in 10 years MUS  . COLONOSCOPY WITH PROPOFOL N/A 02/08/2019   Procedure: COLONOSCOPY WITH PROPOFOL;  Surgeon: Lollie Sails, MD;  Location: [facility] ENDOSCOPY;  Service: Endoscopy;  Laterality: N/A;  . ESOPHAGOGASTRODUODENOSCOPY (EGD) WITH PROPOFOL N/A 10/22/2016   Procedure: ESOPHAGOGASTRODUODENOSCOPY (EGD) WITH PROPOFOL;  Surgeon: Lollie Sails, MD;  Location: [facility] ENDOSCOPY;  Service: Endoscopy;  Laterality: N/A;  . ESOPHAGOGASTRODUODENOSCOPY (EGD) WITH PROPOFOL N/A 07/14/2018   Procedure: ESOPHAGOGASTRODUODENOSCOPY (EGD) WITH PROPOFOL;  Surgeon: Lucilla Lame, MD;  Location: [facility] ENDOSCOPY;  Service: Endoscopy;  Laterality: N/A;  . FRACTURE SURGERY Left    arm  . SHOULDER ARTHROSCOPY WITH SUBACROMIAL DECOMPRESSION AND BICEP TENDON REPAIR Right 04/16/2018   Procedure: ARTHROSCOPIC  BICEP TENOTOMY;  Surgeon: Thornton Park, MD;  Location: [facility] ORS;  Service: Orthopedics;  Laterality: Right;  . UPPER ESOPHAGEAL ENDOSCOPIC ULTRASOUND (EUS)  6/15 ue to repeat in 1 year    Family History  Problem Relation Age of Onset  . Hypertension Father   . Heart attack Father   . Heart disease Sister   . Hypertension Sister     Social History   Tobacco  Use  . Smoking status: Never Smoker  . Smokeless tobacco: Never Used  Substance Use Topics  . Alcohol use: No  . Drug use: No      Current Outpatient Medications:  .  ACCU-CHEK AVIVA PLUS test strip, USE 1 STRIP TO CHECK GLUCOSE ONCE DAILY, Disp: 50 each, Rfl: 5 .  albuterol (PROAIR HFA) 108 (90 Base) MCG/ACT inhaler, Inhale 2 puffs into the lungs every 4 (four) hours as needed for wheezing or shortness of breath., Disp: 1  Inhaler, Rfl: 1 .  atorvastatin (LIPITOR) 80 MG tablet, Take 1 tablet (80 mg total) by mouth daily., Disp: 90 tablet, Rfl: 1 .  Cholecalciferol (VITAMIN D3 PO), Take 1 capsule by mouth daily., Disp: , Rfl:  .  esomeprazole (NEXIUM) 40 MG capsule, Take 40 mg by mouth daily. , Disp: , Rfl:  .  ezetimibe (ZETIA) 10 MG tablet, Take 1 tablet (10 mg total) by mouth daily., Disp: 90 tablet, Rfl: 3 .  fluticasone (FLONASE) 50 MCG/ACT nasal spray, Place 2 sprays into both nostrils daily as needed., Disp: 16 g, Rfl: 11 .  glimepiride (AMARYL) 1 MG tablet, Take 1 tablet (1 mg total) by mouth daily. With breakfast, hold if blood sugar < 100 or not eating, Disp: 90 tablet, Rfl: 1 .  Lancets (ACCU-CHEK MULTICLIX) lancets, USE AS DIRECTED, Disp: 102 each, Rfl: 12 .  metFORMIN (GLUCOPHAGE) 1000 MG tablet, Take 1 tablet (1,000 mg total) by mouth 2 (two) times daily with a meal., Disp: 180 tablet, Rfl: 1 .  pioglitazone (ACTOS) 15 MG tablet, TAKE 1 TABLET BY MOUTH ONCE DAILY -  STOP  AND  SEEK  MEDICAL  ATTENTION  IF  WEIGHT  GAIN,  LEG  SWELLING,  SHORTNESS  OF  BREATH, Disp: 90 tablet, Rfl: 0  Allergies  Allergen Reactions  . Losartan Potassium Other (See Comments)    hyperkalemia    Chart Review Today: I personally reviewed active problem list, medication list, allergies, family history, social history, health maintenance, notes from last encounter, lab results, imaging with the patient/caregiver today.   Review of Systems  10 Systems reviewed and are negative for acute change except as noted in the HPI.  Objective:    Vitals:   10/27/19 0754  BP: 132/80  Pulse: 89  Resp: 16  Temp: 97.9 F (36.6 C)  TempSrc: Temporal  SpO2: 98%  Weight: 200 lb 8 oz (90.9 kg)  Height: 5\' 11"  (1.803 m)    Body mass index is 27.96 kg/m.  Physical Exam Vitals and nursing note reviewed.  Constitutional:      General: He is not in acute distress.    Appearance: Normal appearance. He is well-developed. He is  not ill-appearing, toxic-appearing or diaphoretic.     Interventions: Face mask in place.  HENT:     Head: Normocephalic and atraumatic.     Jaw: No trismus.     Right Ear: External ear normal.     Left Ear: External ear normal.  Eyes:     General: Lids are normal. No scleral icterus.    Conjunctiva/sclera: Conjunctivae normal.     Pupils: Pupils are equal, round, and reactive to light.  Neck:     Trachea: Trachea and phonation normal. No tracheal deviation.  Cardiovascular:     Rate and Rhythm: Normal rate and regular rhythm.  No extrasystoles are present.    Chest Wall: PMI is not displaced.     Pulses: Normal pulses.  Radial pulses are 2+ on the right side and 2+ on the left side.       Posterior tibial pulses are 2+ on the right side and 2+ on the left side.     Heart sounds: Normal heart sounds. No murmur. No friction rub. No gallop.   Pulmonary:     Effort: Pulmonary effort is normal. No respiratory distress.     Breath sounds: Normal breath sounds. No stridor. No wheezing, rhonchi or rales.  Abdominal:     General: Bowel sounds are normal. There is no distension.     Palpations: Abdomen is soft.     Tenderness: There is no abdominal tenderness. There is no guarding or rebound.  Musculoskeletal:        General: Normal range of motion.     Cervical back: Normal range of motion and neck supple.     Right lower leg: No edema.     Left lower leg: No edema.  Skin:    General: Skin is warm.     Capillary Refill: Capillary refill takes less than 2 seconds.     Coloration: Skin is not jaundiced.     Findings: No rash.     Nails: There is no clubbing.  Neurological:     Mental Status: He is alert.     Cranial Nerves: No dysarthria or facial asymmetry.     Motor: No tremor or abnormal muscle tone.     Gait: Gait normal.  Psychiatric:        Mood and Affect: Mood normal.        Speech: Speech normal.        Behavior: Behavior normal. Behavior is cooperative.        PHQ2/9: Depression screen [facility] 2/9 10/27/2019 06/29/2019 06/22/2019 03/26/2019 02/25/2019  Decreased Interest 0 1 1 0 0  Down, Depressed, Hopeless 0 1 1 0 0  PHQ - 2 Score 0 2 2 0 0  Altered sleeping 1 0 0 0 0  Tired, decreased energy 3 2 1  0 0  Change in appetite 0 2 0 0 0  Feeling bad or failure about yourself  1 1 1  0 0  Trouble concentrating 0 1 0 0 0  Moving slowly or fidgety/restless 0 1 0 0 0  Suicidal thoughts 0 1 0 0 0  PHQ-9 Score 5 10 4  0 0  Difficult doing work/chores Somewhat difficult Somewhat difficult Not difficult at all Not difficult at all Not difficult at all  Some recent data might be hidden    phq 9 is neg, reviewed  Fall Risk: Fall Risk  10/27/2019 06/29/2019 06/22/2019 03/26/2019 02/25/2019  Falls in the past year? 0 0 0 0 0  Number falls in past yr: 0 0 0 0 0  Injury with Fall? 0 0 0 0 0  Risk for fall due to : - - No Fall Risks - -  Follow up - - Falls prevention discussed - -    Functional Status Survey: Is the patient deaf or have difficulty hearing?: Yes Does the patient have difficulty seeing, even when wearing glasses/contacts?: Yes Does the patient have difficulty concentrating, remembering, or making decisions?: Yes(memory) Does the patient have difficulty walking or climbing stairs?: No Does the patient have difficulty dressing or bathing?: No Does the patient have difficulty doing errands alone such as visiting a doctor's office or shopping?: No   Assessment & Plan:     ICD-10-CM   1. Essential hypertension  99991111 COMPLETE METABOLIC  PANEL WITH GFR   stable, well controlled  2. Mixed hyperlipidemia  99991111 COMPLETE METABOLIC PANEL WITH GFR    Lipid panel   stable, well controlled, compliant with statin, no SE or myalgias  3. Iron deficiency anemia, unspecified iron deficiency anemia type  D50.9 CBC with Differential/Platelet   recheck CBC due to pts complaint today of fatigue  4. Gastroesophageal reflux disease with esophagitis, unspecified  whether hemorrhage  K21.00    well controlled with nexium, no concerning sx, no dysphagia  5. Uncontrolled type 2 diabetes mellitus with hyperglycemia (HCC)  123456 COMPLETE METABOLIC PANEL WITH GFR    Lipid panel    Hemoglobin A1c   recently uncontrolled, CBGs sound like they are improving, due for labs  6. Encounter for medication monitoring  Z51.81 CBC with Differential/Platelet    COMPLETE METABOLIC PANEL WITH GFR    Lipid panel    Hemoglobin A1c  7. Environmental allergies  Z91.09 fluticasone (FLONASE) 50 MCG/ACT nasal spray    levocetirizine (XYZAL) 5 MG tablet   new mention of allergies and what sounds like RAD/bronchospasm - start daily allergy meds for control, inhaler PRN, f/up if not improving  8. Reactive airway disease without complication, unspecified asthma severity, unspecified whether persistent  J45.909 albuterol (PROAIR HFA) 108 (90 Base) MCG/ACT inhaler     Return for 3 month routine f/up (will need to come sooner if A1C is high).   Delsa Grana, PA-C 10/27/19 8:12 AM

## 2019-10-27 NOTE — Patient Instructions (Signed)
Start xyzal at night and continue flonase for allergies and post-nasal drip

## 2019-10-28 LAB — CBC WITH DIFFERENTIAL/PLATELET
Absolute Monocytes: 684 cells/uL (ref 200–950)
Basophils Absolute: 72 cells/uL (ref 0–200)
Basophils Relative: 1.2 %
Eosinophils Absolute: 390 cells/uL (ref 15–500)
Eosinophils Relative: 6.5 %
HCT: 36.5 % — ABNORMAL LOW (ref 38.5–50.0)
Hemoglobin: 12.4 g/dL — ABNORMAL LOW (ref 13.2–17.1)
Lymphs Abs: 1878 cells/uL (ref 850–3900)
MCH: 30.5 pg (ref 27.0–33.0)
MCHC: 34 g/dL (ref 32.0–36.0)
MCV: 89.9 fL (ref 80.0–100.0)
MPV: 9.8 fL (ref 7.5–12.5)
Monocytes Relative: 11.4 %
Neutro Abs: 2976 cells/uL (ref 1500–7800)
Neutrophils Relative %: 49.6 %
Platelets: 332 10*3/uL (ref 140–400)
RBC: 4.06 10*6/uL — ABNORMAL LOW (ref 4.20–5.80)
RDW: 13.6 % (ref 11.0–15.0)
Total Lymphocyte: 31.3 %
WBC: 6 10*3/uL (ref 3.8–10.8)

## 2019-10-28 LAB — LIPID PANEL
Cholesterol: 106 mg/dL (ref <200)
HDL: 47 mg/dL (ref >=40)
LDL Cholesterol (Calc): 43 mg/dL (calc)
Non-HDL Cholesterol (Calc): 59 mg/dL (calc) (ref <130)
Total CHOL/HDL Ratio: 2.3 (calc) (ref <5.0)
Triglycerides: 77 mg/dL (ref <150)

## 2019-10-28 LAB — COMPLETE METABOLIC PANEL WITH GFR
AG Ratio: 2.2 (calc) (ref 1.0–2.5)
ALT: 17 U/L (ref 9–46)
AST: 18 U/L (ref 10–35)
Albumin: 4.6 g/dL (ref 3.6–5.1)
Alkaline phosphatase (APISO): 102 U/L (ref 35–144)
BUN/Creatinine Ratio: 14 (calc) (ref 6–22)
BUN: 18 mg/dL (ref 7–25)
CO2: 26 mmol/L (ref 20–32)
Calcium: 9.7 mg/dL (ref 8.6–10.3)
Chloride: 105 mmol/L (ref 98–110)
Creat: 1.32 mg/dL — ABNORMAL HIGH (ref 0.70–1.18)
GFR, Est African American: 62 mL/min/{1.73_m2} (ref >=60)
GFR, Est Non African American: 53 mL/min/{1.73_m2} — ABNORMAL LOW (ref >=60)
Globulin: 2.1 g/dL (calc) (ref 1.9–3.7)
Glucose, Bld: 151 mg/dL — ABNORMAL HIGH (ref 65–99)
Potassium: 4.9 mmol/L (ref 3.5–5.3)
Sodium: 140 mmol/L (ref 135–146)
Total Bilirubin: 0.9 mg/dL (ref 0.2–1.2)
Total Protein: 6.7 g/dL (ref 6.1–8.1)

## 2019-10-28 LAB — HEMOGLOBIN A1C
Hgb A1c MFr Bld: 7.1 % of total Hgb — ABNORMAL HIGH (ref <5.7)
Mean Plasma Glucose: 157 (calc)
eAG (mmol/L): 8.7 (calc)

## 2019-12-25 ENCOUNTER — Other Ambulatory Visit: Payer: Self-pay | Admitting: Family Medicine

## 2019-12-25 DIAGNOSIS — E782 Mixed hyperlipidemia: Secondary | ICD-10-CM

## 2020-01-05 ENCOUNTER — Other Ambulatory Visit: Payer: Self-pay | Admitting: Family Medicine

## 2020-01-05 DIAGNOSIS — IMO0002 Reserved for concepts with insufficient information to code with codable children: Secondary | ICD-10-CM

## 2020-01-05 MED ORDER — METFORMIN HCL 1000 MG PO TABS: 1000.0000 mg | ORAL_TABLET | Freq: Two times a day (BID) | ORAL | 3 refills | Status: DC

## 2020-01-05 NOTE — Addendum Note (Signed)
Addended by: Docia Furl on: 01/05/2020 08:41 AM   Modules accepted: Orders

## 2020-01-07 ENCOUNTER — Other Ambulatory Visit: Payer: Self-pay | Admitting: Family Medicine

## 2020-01-07 DIAGNOSIS — IMO0002 Reserved for concepts with insufficient information to code with codable children: Secondary | ICD-10-CM

## 2020-01-07 DIAGNOSIS — E1122 Type 2 diabetes mellitus with diabetic chronic kidney disease: Secondary | ICD-10-CM

## 2020-01-08 ENCOUNTER — Other Ambulatory Visit: Payer: Self-pay | Admitting: Family Medicine

## 2020-01-08 DIAGNOSIS — IMO0002 Reserved for concepts with insufficient information to code with codable children: Secondary | ICD-10-CM

## 2020-01-08 DIAGNOSIS — E1122 Type 2 diabetes mellitus with diabetic chronic kidney disease: Secondary | ICD-10-CM

## 2020-01-28 ENCOUNTER — Other Ambulatory Visit: Payer: Self-pay

## 2020-01-28 ENCOUNTER — Encounter: Payer: Self-pay | Admitting: Family Medicine

## 2020-01-28 ENCOUNTER — Ambulatory Visit (INDEPENDENT_AMBULATORY_CARE_PROVIDER_SITE_OTHER): Payer: Medicare Other | Admitting: Family Medicine

## 2020-01-28 VITALS — BP 138/86 | HR 93 | Temp 98.1°F | Resp 18 | Ht 71.0 in | Wt 210.4 lb

## 2020-01-28 DIAGNOSIS — E1121 Type 2 diabetes mellitus with diabetic nephropathy: Secondary | ICD-10-CM

## 2020-01-28 DIAGNOSIS — D509 Iron deficiency anemia, unspecified: Secondary | ICD-10-CM

## 2020-01-28 DIAGNOSIS — I10 Essential (primary) hypertension: Secondary | ICD-10-CM

## 2020-01-28 DIAGNOSIS — Z5181 Encounter for therapeutic drug level monitoring: Secondary | ICD-10-CM

## 2020-01-28 DIAGNOSIS — N1831 Chronic kidney disease, stage 3a: Secondary | ICD-10-CM | POA: Diagnosis not present

## 2020-01-28 DIAGNOSIS — J45909 Unspecified asthma, uncomplicated: Secondary | ICD-10-CM

## 2020-01-28 DIAGNOSIS — Z9109 Other allergy status, other than to drugs and biological substances: Secondary | ICD-10-CM

## 2020-01-28 LAB — COMPLETE METABOLIC PANEL WITH GFR
AG Ratio: 2.1 (calc) (ref 1.0–2.5)
ALT: 15 U/L (ref 9–46)
AST: 16 U/L (ref 10–35)
Albumin: 4.1 g/dL (ref 3.6–5.1)
Alkaline phosphatase (APISO): 102 U/L (ref 35–144)
BUN/Creatinine Ratio: 12 (calc) (ref 6–22)
BUN: 16 mg/dL (ref 7–25)
CO2: 27 mmol/L (ref 20–32)
Calcium: 9.7 mg/dL (ref 8.6–10.3)
Chloride: 107 mmol/L (ref 98–110)
Creat: 1.35 mg/dL — ABNORMAL HIGH (ref 0.70–1.18)
GFR, Est African American: 60 mL/min/{1.73_m2} (ref >=60)
GFR, Est Non African American: 51 mL/min/{1.73_m2} — ABNORMAL LOW (ref >=60)
Globulin: 2 g/dL (calc) (ref 1.9–3.7)
Glucose, Bld: 133 mg/dL — ABNORMAL HIGH (ref 65–99)
Potassium: 5.1 mmol/L (ref 3.5–5.3)
Sodium: 141 mmol/L (ref 135–146)
Total Bilirubin: 0.5 mg/dL (ref 0.2–1.2)
Total Protein: 6.1 g/dL (ref 6.1–8.1)

## 2020-01-28 LAB — LIPID PANEL
Cholesterol: 88 mg/dL (ref <200)
HDL: 40 mg/dL (ref >=40)
LDL Cholesterol (Calc): 32 mg/dL (calc)
Non-HDL Cholesterol (Calc): 48 mg/dL (calc) (ref <130)
Total CHOL/HDL Ratio: 2.2 (calc) (ref <5.0)
Triglycerides: 76 mg/dL (ref <150)

## 2020-01-28 LAB — CBC WITH DIFFERENTIAL/PLATELET
Absolute Monocytes: 956 cells/uL — ABNORMAL HIGH (ref 200–950)
Basophils Absolute: 83 cells/uL (ref 0–200)
Basophils Relative: 0.7 %
Eosinophils Absolute: 448 cells/uL (ref 15–500)
Eosinophils Relative: 3.8 %
HCT: 35.3 % — ABNORMAL LOW (ref 38.5–50.0)
Hemoglobin: 11.5 g/dL — ABNORMAL LOW (ref 13.2–17.1)
Lymphs Abs: 1782 cells/uL (ref 850–3900)
MCH: 29.9 pg (ref 27.0–33.0)
MCHC: 32.6 g/dL (ref 32.0–36.0)
MCV: 91.7 fL (ref 80.0–100.0)
MPV: 10.2 fL (ref 7.5–12.5)
Monocytes Relative: 8.1 %
Neutro Abs: 8531 cells/uL — ABNORMAL HIGH (ref 1500–7800)
Neutrophils Relative %: 72.3 %
Platelets: 295 10*3/uL (ref 140–400)
RBC: 3.85 10*6/uL — ABNORMAL LOW (ref 4.20–5.80)
RDW: 13.9 % (ref 11.0–15.0)
Total Lymphocyte: 15.1 %
WBC: 11.8 10*3/uL — ABNORMAL HIGH (ref 3.8–10.8)

## 2020-01-28 MED ORDER — GLIMEPIRIDE 1 MG PO TABS: 1.0000 mg | ORAL_TABLET | Freq: Every day | ORAL | 2 refills | Status: DC

## 2020-01-28 NOTE — Progress Notes (Signed)
Name: Paul Spears   MRN: [id number]    DOB: 22-Jan-1946   Date:01/28/2020       Progress Note  Chief Complaint  Patient presents with  . Diabetes  . Hyperlipidemia  . Hypertension     Subjective:   Paul Spears is a 74 y.o. male, presents to clinic for routine f/up on dx listed above  He additionally complains of spme early morning waking with sx in chest/SOB  Denies any significant acid reflux  He was having worse allergies and respiratory sx last visit    Hypertension:  Managed with diet/lifestyle - no meds No lightheadedness, hypotension, syncope. Blood pressure today is fairly controlled for age BP Readings from Last 3 Encounters:  01/28/20 138/86  10/27/19 132/80  06/29/19 130/78   Pt denies CP, SOB, exertional sx, LE edema, palpitation, Ha's, visual disturbances  Hyperlipidemia: Currently treated with lipitor 80 mg daily, pt reports good med compliance Last Lipids: may and jan 2021 - well controlled - Denies: Chest pain, shortness of breath, myalgias, claudication   Anemia - no fatigue right now, no change  DM -  Uncontrolled, but A1C has been improving, 06/2019 A1C was 8.7, May was 7.1, pt managed on actos, glimepiride and metformin - sugars have been 130's, higher when he eats ice cream.   Current Outpatient Medications:  .  albuterol (PROAIR HFA) 108 (90 Base) MCG/ACT inhaler, Inhale 2 puffs into the lungs every 4 (four) hours as needed for wheezing or shortness of breath., Disp: 18 g, Rfl: 0 .  atorvastatin (LIPITOR) 80 MG tablet, Take 1 tablet by mouth once daily, Disp: 90 tablet, Rfl: 3 .  Cholecalciferol (VITAMIN D3 PO), Take 1 capsule by mouth daily., Disp: , Rfl:  .  esomeprazole (NEXIUM) 40 MG capsule, Take 40 mg by mouth daily. , Disp: , Rfl:  .  ezetimibe (ZETIA) 10 MG tablet, Take 1 tablet (10 mg total) by mouth daily., Disp: 90 tablet, Rfl: 3 .  fluticasone (FLONASE) 50 MCG/ACT nasal spray, Place 2 sprays into both nostrils daily as needed.,  Disp: 16 g, Rfl: 11 .  glimepiride (AMARYL) 1 MG tablet, Take 1 tablet (1 mg total) by mouth daily. With breakfast, hold if blood sugar < 100 or not eating, Disp: 90 tablet, Rfl: 1 .  levocetirizine (XYZAL) 5 MG tablet, Take 1 tablet (5 mg total) by mouth every evening., Disp: 30 tablet, Rfl: 2 .  metFORMIN (GLUCOPHAGE) 1000 MG tablet, TAKE 1 TABLET BY MOUTH TWICE DAILY WITH MEALS, Disp: 180 tablet, Rfl: 0 .  pioglitazone (ACTOS) 15 MG tablet, TAKE 1 TABLET BY MOUTH ONCE DAILY STOP  AND  SEEK  MEDICAL  ATTENTION  IF  WEIGHT  GAIN,  LEG  SWELLING,  SHORTNESS  OF  BREATH, Disp: 90 tablet, Rfl: 3 .  ACCU-CHEK AVIVA PLUS test strip, USE 1 STRIP TO CHECK GLUCOSE ONCE DAILY (Patient not taking: Reported on 01/28/2020), Disp: 50 each, Rfl: 5 .  Lancets (ACCU-CHEK MULTICLIX) lancets, USE AS DIRECTED (Patient not taking: Reported on 01/28/2020), Disp: 102 each, Rfl: 12  Patient Active Problem List   Diagnosis Date Noted  . Iron deficiency anemia 11/11/2017  . DDD (degenerative disc disease), cervical 12/07/2015  . GERD with esophagitis 09/21/2014  . Barrett esophagus 09/21/2014  . Cervical osteoarthritis 09/21/2014  . Chronic kidney disease (CKD), stage III (moderate) 09/21/2014  . Controlled type 2 diabetes mellitus with diabetic nephropathy ([place]) 09/21/2014  . Microalbuminuria 09/21/2014  . Osteopenia 09/21/2014  . HTN (hypertension)  05/20/2014  . Hyperlipidemia 05/20/2014  . Benign hypertensive kidney disease with chronic kidney disease 05/20/2014    Past Surgical History:  Procedure Laterality Date  . CARPAL TUNNEL RELEASE Right   . COLONOSCOPY  10/26/2012   repeat in 10 years MUS  . COLONOSCOPY WITH PROPOFOL N/A 02/08/2019   Procedure: COLONOSCOPY WITH PROPOFOL;  Surgeon: Lollie Sails, MD;  Location: [facility] ENDOSCOPY;  Service: Endoscopy;  Laterality: N/A;  . ESOPHAGOGASTRODUODENOSCOPY (EGD) WITH PROPOFOL N/A 10/22/2016   Procedure: ESOPHAGOGASTRODUODENOSCOPY (EGD) WITH PROPOFOL;  Surgeon:  Lollie Sails, MD;  Location: [facility] ENDOSCOPY;  Service: Endoscopy;  Laterality: N/A;  . ESOPHAGOGASTRODUODENOSCOPY (EGD) WITH PROPOFOL N/A 07/14/2018   Procedure: ESOPHAGOGASTRODUODENOSCOPY (EGD) WITH PROPOFOL;  Surgeon: Lucilla Lame, MD;  Location: [facility] ENDOSCOPY;  Service: Endoscopy;  Laterality: N/A;  . FRACTURE SURGERY Left    arm  . SHOULDER ARTHROSCOPY WITH SUBACROMIAL DECOMPRESSION AND BICEP TENDON REPAIR Right 04/16/2018   Procedure: ARTHROSCOPIC  BICEP TENOTOMY;  Surgeon: Thornton Park, MD;  Location: [facility] ORS;  Service: Orthopedics;  Laterality: Right;  . UPPER ESOPHAGEAL ENDOSCOPIC ULTRASOUND (EUS)  6/15 ue to repeat in 1 year    Family History  Problem Relation Age of Onset  . Hypertension Father   . Heart attack Father   . Heart disease Sister   . Hypertension Sister     Social History   Tobacco Use  . Smoking status: Never Smoker  . Smokeless tobacco: Never Used  Vaping Use  . Vaping Use: Never used  Substance Use Topics  . Alcohol use: No  . Drug use: No     Allergies  Allergen Reactions  . Losartan Potassium Other (See Comments)    hyperkalemia    Health Maintenance  Topic Date Due  . INFLUENZA VACCINE  01/16/2020  . OPHTHALMOLOGY EXAM  04/27/2020  . HEMOGLOBIN A1C  04/28/2020  . FOOT EXAM  06/28/2020  . URINE MICROALBUMIN  06/28/2020  . TETANUS/TDAP  04/03/2026  . COLONOSCOPY  02/07/2029  . COVID-19 Vaccine  Completed  . Hepatitis C Screening  Completed  . PNA vac Low Risk Adult  Completed    Chart Review Today: I personally reviewed active problem list, medication list, allergies, family history, social history, health maintenance, notes from last encounter, lab results, imaging with the patient/caregiver today.   Review of Systems  10 Systems reviewed and are negative for acute change except as noted in the HPI.  Objective:   Vitals:   01/28/20 0805  BP: 138/86  Pulse: 93  Resp: 18  Temp: 98.1 F (36.7 C)  TempSrc: Oral    SpO2: 99%  Weight: 210 lb 6.4 oz (95.4 kg)  Height: 5\' 11"  (1.803 m)    Body mass index is 29.34 kg/m.  Physical Exam Vitals and nursing note reviewed.  Constitutional:      General: He is not in acute distress.    Appearance: Normal appearance. He is well-developed. He is not ill-appearing, toxic-appearing or diaphoretic.     Interventions: Face mask in place.  HENT:     Head: Normocephalic and atraumatic.     Jaw: No trismus.     Right Ear: External ear normal.     Left Ear: External ear normal.  Eyes:     General: Lids are normal. No scleral icterus.    Conjunctiva/sclera: Conjunctivae normal.     Pupils: Pupils are equal, round, and reactive to light.  Neck:     Trachea: Trachea and phonation normal. No tracheal deviation.  Cardiovascular:  Rate and Rhythm: Normal rate and regular rhythm.  No extrasystoles are present.    Chest Wall: PMI is not displaced.     Pulses: Normal pulses.          Radial pulses are 2+ on the right side and 2+ on the left side.       Posterior tibial pulses are 2+ on the right side and 2+ on the left side.     Heart sounds: Normal heart sounds. No murmur heard.  No friction rub. No gallop.   Pulmonary:     Effort: Pulmonary effort is normal. No respiratory distress.     Breath sounds: Normal breath sounds. No stridor. No wheezing, rhonchi or rales.  Abdominal:     General: Bowel sounds are normal. There is no distension.     Palpations: Abdomen is soft.     Tenderness: There is no abdominal tenderness. There is no guarding or rebound.  Musculoskeletal:        General: Normal range of motion.     Cervical back: Normal range of motion and neck supple.     Right lower leg: No edema.     Left lower leg: No edema.  Skin:    General: Skin is warm.     Capillary Refill: Capillary refill takes less than 2 seconds.     Coloration: Skin is not jaundiced.     Findings: No rash.     Nails: There is no clubbing.  Neurological:     Mental  Status: He is alert.     Cranial Nerves: No dysarthria or facial asymmetry.     Motor: No tremor or abnormal muscle tone.     Gait: Gait normal.  Psychiatric:        Mood and Affect: Mood normal.        Speech: Speech normal.        Behavior: Behavior normal. Behavior is cooperative.         Assessment & Plan:     ICD-10-CM   1. Controlled type 2 diabetes mellitus with diabetic nephropathy, without long-term current use of insulin (HCC)  E11.21 glimepiride (AMARYL) 1 MG tablet   Uncontroled- has not yet gotten to goal of A1c under 7, patient admits worse diet sweets and ice cream with depressed mood, foot exam done, check labs  2. Reactive airway disease without complication, unspecified asthma severity, unspecified whether persistent  J45.909    suspect pt is having some RAD - trial albuterol - ensure he is tx GERD adequately, elevate HOB, controll allergies and postnasal drip  3. Essential hypertension  U76 COMPLETE METABOLIC PANEL WITH GFR   stable, well controlled with diet/lifestyle  4. Stage 3a chronic kidney disease  L46.50 COMPLETE METABOLIC PANEL WITH GFR   monitoring  5. Iron deficiency anemia, unspecified iron deficiency anemia type  D50.9 Lipid panel   He has hx of iron deficiency anemia, which has been stable - no current signs or sx of bleeding - monitoring cbc  6. Environmental allergies  Z91.09    improve tx and control off allergies - 2ng generation antihistamines daily during seasons of worsening sx  7. Encounter for medication monitoring  Z51.81 CBC with Differential/Platelet    COMPLETE METABOLIC PANEL WITH GFR    Lipid panel     Return in about 6 months (around 07/30/2020) for Routine follow-up.   Delsa Grana, PA-C 01/28/20 8:15 AM

## 2020-03-13 NOTE — Progress Notes (Signed)
Name: Paul Spears   MRN: [id number]    DOB: 30-Oct-1945   Date:03/14/2020       Progress Note  Chief Complaint  Patient presents with  . Follow-up    recheck labs     Subjective:   Paul Spears is a 74 y.o. male, presents to clinic for f/up on abnormal labs  Lab Results  Component Value Date   HGBA1C 7.1 (H) 10/27/2019   Drop in H/H with last labs, hx of anemia mno blood loss  Hypertension:  Currently managed on amlodipine 2.5 mg added by nephrology, home BP monitoring pt states has improved from SBP 160's to now 130's Pt reports good med compliance and denies any SE.   Blood pressure today is well controlled. BP Readings from Last 3 Encounters:  03/14/20 136/72  01/28/20 138/86  10/27/19 132/80   Hemoglobin  Date Value Ref Range Status  01/28/2020 11.5 (L) 13.2 - 17.1 g/dL Final  10/27/2019 12.4 (L) 13.2 - 17.1 g/dL Final  06/30/2019 12.0 (L) 13.2 - 17.1 g/dL Final  03/26/2019 12.9 (L) 13.2 - 17.1 g/dL Final   He has been worked up for iron deficiency anemia before, he denies any melena, hematochezia.  He does feel generally fatigued and also depressed  DM:   Pt managing DM with actos, metformin and glimepiride 1 mg (hold for low blood sugars) pt hasn't recently checked sugars Reports good med compliance Pt has no SE from meds.  Denies: Polyuria, polydipsia, vision changes, neuropathy, hypoglycemia Recent pertinent labs: Lab Results  Component Value Date   HGBA1C 7.1 (H) 10/27/2019   HGBA1C 8.7 (H) 06/30/2019   HGBA1C 7.4 (A) 02/25/2019        Current Outpatient Medications:  .  ACCU-CHEK AVIVA PLUS test strip, USE 1 STRIP TO CHECK GLUCOSE ONCE DAILY, Disp: 50 each, Rfl: 5 .  albuterol (PROAIR HFA) 108 (90 Base) MCG/ACT inhaler, Inhale 2 puffs into the lungs every 4 (four) hours as needed for wheezing or shortness of breath., Disp: 18 g, Rfl: 2 .  amLODipine (NORVASC) 2.5 MG tablet, Take 2.5 mg by mouth daily., Disp: , Rfl:  .  atorvastatin  (LIPITOR) 80 MG tablet, Take 1 tablet by mouth once daily, Disp: 90 tablet, Rfl: 3 .  Cholecalciferol (VITAMIN D3 PO), Take 1 capsule by mouth daily., Disp: , Rfl:  .  esomeprazole (NEXIUM) 40 MG capsule, Take 40 mg by mouth daily. , Disp: , Rfl:  .  ezetimibe (ZETIA) 10 MG tablet, Take 1 tablet (10 mg total) by mouth daily., Disp: 90 tablet, Rfl: 3 .  fluticasone (FLONASE) 50 MCG/ACT nasal spray, Place 2 sprays into both nostrils daily as needed., Disp: 16 g, Rfl: 11 .  glimepiride (AMARYL) 1 MG tablet, Take 1 tablet (1 mg total) by mouth daily. With breakfast, hold if blood sugar < 100 or not eating, Disp: 90 tablet, Rfl: 2 .  Lancets (ACCU-CHEK MULTICLIX) lancets, USE AS DIRECTED, Disp: 102 each, Rfl: 12 .  levocetirizine (XYZAL) 5 MG tablet, Take 1 tablet (5 mg total) by mouth every evening., Disp: 30 tablet, Rfl: 2 .  metFORMIN (GLUCOPHAGE) 1000 MG tablet, TAKE 1 TABLET BY MOUTH TWICE DAILY WITH MEALS, Disp: 180 tablet, Rfl: 0 .  pioglitazone (ACTOS) 15 MG tablet, TAKE 1 TABLET BY MOUTH ONCE DAILY STOP  AND  SEEK  MEDICAL  ATTENTION  IF  WEIGHT  GAIN,  LEG  SWELLING,  SHORTNESS  OF  BREATH, Disp: 90 tablet, Rfl: 3  Patient  Active Problem List   Diagnosis Date Noted  . Iron deficiency anemia 11/11/2017  . DDD (degenerative disc disease), cervical 12/07/2015  . GERD with esophagitis 09/21/2014  . Barrett esophagus 09/21/2014  . Cervical osteoarthritis 09/21/2014  . Chronic kidney disease (CKD), stage III (moderate) 09/21/2014  . Controlled type 2 diabetes mellitus with diabetic nephropathy ([place]) 09/21/2014  . Microalbuminuria 09/21/2014  . Osteopenia 09/21/2014  . HTN (hypertension) 05/20/2014  . Hyperlipidemia 05/20/2014  . Benign hypertensive kidney disease with chronic kidney disease 05/20/2014    Past Surgical History:  Procedure Laterality Date  . CARPAL TUNNEL RELEASE Right   . COLONOSCOPY  10/26/2012   repeat in 10 years MUS  . COLONOSCOPY WITH PROPOFOL N/A 02/08/2019    Procedure: COLONOSCOPY WITH PROPOFOL;  Surgeon: Lollie Sails, MD;  Location[facility] ENDOSCOPY;  Service: Endoscopy;  Laterality: N/A;  . ESOPHAGOGASTRODUODENOSCOPY (EGD) WITH PROPOFOL N/A 10/22/2016   Procedure: ESOPHAGOGASTRODUODENOSCOPY (EGD) WITH PROPOFOL;  Surgeon: Lollie Sails, MD;  Location[facility] ENDOSCOPY;  Service: Endoscopy;  Laterality: N/A;  . ESOPHAGOGASTRODUODENOSCOPY (EGD) WITH PROPOFOL N/A 07/14/2018   Procedure: ESOPHAGOGASTRODUODENOSCOPY (EGD) WITH PROPOFOL;  Surgeon: Lucilla Lame, MD;  Location: [facility] ENDOSCOPY;  Service: Endoscopy;  Laterality: N/A;  . FRACTURE SURGERY Left    arm  . SHOULDER ARTHROSCOPY WITH SUBACROMIAL DECOMPRESSION AND BICEP TENDON REPAIR Right 04/16/2018   Procedure: ARTHROSCOPIC  BICEP TENOTOMY;  Surgeon: Thornton Park, MD;  Location: [facility] ORS;  Service: Orthopedics;  Laterality: Right;  . UPPER ESOPHAGEAL ENDOSCOPIC ULTRASOUND (EUS)  6/15 ue to repeat in 1 year    Family History  Problem Relation Age of Onset  . Hypertension Father   . Heart attack Father   . Heart disease Sister   . Hypertension Sister     Social History   Tobacco Use  . Smoking status: Never Smoker  . Smokeless tobacco: Never Used  Vaping Use  . Vaping Use: Never used  Substance Use Topics  . Alcohol use: No  . Drug use: No     Allergies  Allergen Reactions  . Losartan Potassium Other (See Comments)    hyperkalemia    Health Maintenance  Topic Date Due  . OPHTHALMOLOGY EXAM  04/27/2020  . HEMOGLOBIN A1C  04/28/2020  . FOOT EXAM  06/28/2020  . URINE MICROALBUMIN  06/28/2020  . TETANUS/TDAP  04/03/2026  . COLONOSCOPY  02/07/2029  . INFLUENZA VACCINE  Completed  . COVID-19 Vaccine  Completed  . Hepatitis C Screening  Completed  . PNA vac Low Risk Adult  Completed    Chart Review Today: I personally reviewed active problem list, medication list, allergies, family history, social history, health maintenance, notes from last encounter, lab results,  imaging with the patient/caregiver today.   Review of Systems  10 Systems reviewed and are negative for acute change except as noted in the HPI.  Objective:   Vitals:   03/14/20 1033  BP: 136/72  Pulse: 100  Resp: 18  Temp: 98.1 F (36.7 C)  TempSrc: Oral  SpO2: 94%  Weight: 207 lb 9.6 oz (94.2 kg)  Height: 5\' 11"  (1.803 m)    Body mass index is 28.95 kg/m.  Physical Exam Vitals and nursing note reviewed.  Constitutional:      General: He is not in acute distress.    Appearance: Normal appearance. He is well-developed. He is not ill-appearing, toxic-appearing or diaphoretic.     Interventions: Face mask in place.  HENT:     Head: Normocephalic and atraumatic.  Jaw: No trismus.     Right Ear: External ear normal.     Left Ear: External ear normal.  Eyes:     General: Lids are normal. No scleral icterus.       Right eye: No discharge.        Left eye: No discharge.     Conjunctiva/sclera: Conjunctivae normal.  Neck:     Trachea: Trachea and phonation normal. No tracheal deviation.  Cardiovascular:     Rate and Rhythm: Normal rate and regular rhythm. Occasional extrasystoles are present.    Pulses: Normal pulses.          Radial pulses are 2+ on the right side and 2+ on the left side.       Posterior tibial pulses are 2+ on the right side and 2+ on the left side.     Heart sounds: Normal heart sounds. No murmur heard.  No friction rub. No gallop.   Pulmonary:     Effort: Pulmonary effort is normal. No respiratory distress.     Breath sounds: Normal breath sounds. No stridor. No wheezing, rhonchi or rales.  Abdominal:     General: Bowel sounds are normal. There is no distension.     Palpations: Abdomen is soft.  Musculoskeletal:     Right lower leg: No edema.     Left lower leg: No edema.  Skin:    General: Skin is warm and dry.     Capillary Refill: Capillary refill takes less than 2 seconds.     Coloration: Skin is not jaundiced.     Findings: No rash.       Nails: There is no clubbing.  Neurological:     Mental Status: He is alert. Mental status is at baseline.     Cranial Nerves: No dysarthria or facial asymmetry.     Motor: No tremor or abnormal muscle tone.     Gait: Gait normal.  Psychiatric:        Mood and Affect: Mood normal.        Speech: Speech normal.        Behavior: Behavior normal. Behavior is cooperative.         Assessment & Plan:   1. Iron deficiency anemia, unspecified iron deficiency anemia type Hx of anemia, slightly drop in H/H from his baseline, he feels fatigued, but denies melena or hematochezia VSS today, HR rechecked by me during exam 80 No exertional sx No CP, SOB, syncope Pt w/o pallor today Recheck labs to make sure not continued decrease - CBC w/Diff/Platelet  2. Need for influenza vaccination - Flu Vaccine QUAD High Dose(Fluad)  3. Reactive airway disease without complication, unspecified asthma severity, unspecified whether persistent He continues to have some allergies and cough/wheeze sx, it is better with xyzal, flonase and albuterol inhaler PRN - lungs clear today - albuterol (PROAIR HFA) 108 (90 Base) MCG/ACT inhaler; Inhale 2 puffs into the lungs every 4 (four) hours as needed for wheezing or shortness of breath.  Dispense: 18 g; Refill: 2  4. Controlled type 2 diabetes mellitus with diabetic nephropathy, without long-term current use of insulin ([place]) See #5 - Hemoglobin A1c  5. Uncontrolled type 2 diabetes with stage 3 chronic kidney disease GFR 30-59 (HCC) Last labs near goal, recheck A1C today, continue metformin actos and amaryl, continue diet and lifestyle efforts - metFORMIN (GLUCOPHAGE) 1000 MG tablet; Take 1 tablet (1,000 mg total) by mouth 2 (two) times daily with a meal.  Dispense: 180 tablet;  Refill: 3  6. Stage 3a chronic kidney disease ([place]) Per nephrology, last OV and notes reviewed  7. Essential hypertension Well controlled today on norvasc 2.5 mg daily  Return in  about 6 months (around 09/11/2020) for Routine follow-up.   Delsa Grana, PA-C 03/14/20 11:15 AM

## 2020-03-14 ENCOUNTER — Ambulatory Visit (INDEPENDENT_AMBULATORY_CARE_PROVIDER_SITE_OTHER): Payer: Medicare Other | Admitting: Family Medicine

## 2020-03-14 ENCOUNTER — Other Ambulatory Visit: Payer: Self-pay

## 2020-03-14 ENCOUNTER — Encounter: Payer: Self-pay | Admitting: Family Medicine

## 2020-03-14 VITALS — BP 136/72 | HR 100 | Temp 98.1°F | Resp 18 | Ht 71.0 in | Wt 207.6 lb

## 2020-03-14 DIAGNOSIS — J45909 Unspecified asthma, uncomplicated: Secondary | ICD-10-CM | POA: Diagnosis not present

## 2020-03-14 DIAGNOSIS — Z23 Encounter for immunization: Secondary | ICD-10-CM | POA: Diagnosis not present

## 2020-03-14 DIAGNOSIS — D509 Iron deficiency anemia, unspecified: Secondary | ICD-10-CM

## 2020-03-14 DIAGNOSIS — E1121 Type 2 diabetes mellitus with diabetic nephropathy: Secondary | ICD-10-CM | POA: Diagnosis not present

## 2020-03-14 DIAGNOSIS — N1831 Chronic kidney disease, stage 3a: Secondary | ICD-10-CM

## 2020-03-14 DIAGNOSIS — E1122 Type 2 diabetes mellitus with diabetic chronic kidney disease: Secondary | ICD-10-CM

## 2020-03-14 DIAGNOSIS — E1165 Type 2 diabetes mellitus with hyperglycemia: Secondary | ICD-10-CM

## 2020-03-14 DIAGNOSIS — IMO0002 Reserved for concepts with insufficient information to code with codable children: Secondary | ICD-10-CM

## 2020-03-14 DIAGNOSIS — N183 Chronic kidney disease, stage 3 unspecified: Secondary | ICD-10-CM

## 2020-03-14 DIAGNOSIS — I10 Essential (primary) hypertension: Secondary | ICD-10-CM

## 2020-03-14 MED ORDER — ALBUTEROL SULFATE HFA 108 (90 BASE) MCG/ACT IN AERS: 2.0000 | INHALATION_SPRAY | RESPIRATORY_TRACT | 2 refills | Status: DC | PRN

## 2020-03-14 MED ORDER — METFORMIN HCL 1000 MG PO TABS: 1000.0000 mg | ORAL_TABLET | Freq: Two times a day (BID) | ORAL | 3 refills | Status: DC

## 2020-03-15 LAB — CBC WITH DIFFERENTIAL/PLATELET
Absolute Monocytes: 562 cells/uL (ref 200–950)
Basophils Absolute: 69 cells/uL (ref 0–200)
Basophils Relative: 1.3 %
Eosinophils Absolute: 212 cells/uL (ref 15–500)
Eosinophils Relative: 4 %
HCT: 37.2 % — ABNORMAL LOW (ref 38.5–50.0)
Hemoglobin: 12.1 g/dL — ABNORMAL LOW (ref 13.2–17.1)
Lymphs Abs: 1691 cells/uL (ref 850–3900)
MCH: 29.2 pg (ref 27.0–33.0)
MCHC: 32.5 g/dL (ref 32.0–36.0)
MCV: 89.6 fL (ref 80.0–100.0)
MPV: 9.9 fL (ref 7.5–12.5)
Monocytes Relative: 10.6 %
Neutro Abs: 2767 cells/uL (ref 1500–7800)
Neutrophils Relative %: 52.2 %
Platelets: 353 10*3/uL (ref 140–400)
RBC: 4.15 10*6/uL — ABNORMAL LOW (ref 4.20–5.80)
RDW: 13.5 % (ref 11.0–15.0)
Total Lymphocyte: 31.9 %
WBC: 5.3 10*3/uL (ref 3.8–10.8)

## 2020-03-15 LAB — HEMOGLOBIN A1C
Hgb A1c MFr Bld: 8.2 % of total Hgb — ABNORMAL HIGH (ref <5.7)
Mean Plasma Glucose: 189 (calc)
eAG (mmol/L): 10.4 (calc)

## 2020-03-20 ENCOUNTER — Other Ambulatory Visit: Payer: Self-pay | Admitting: Family Medicine

## 2020-06-22 ENCOUNTER — Ambulatory Visit: Payer: Medicare Other

## 2020-06-22 ENCOUNTER — Other Ambulatory Visit: Payer: Self-pay | Admitting: Family Medicine

## 2020-06-22 DIAGNOSIS — Z9109 Other allergy status, other than to drugs and biological substances: Secondary | ICD-10-CM

## 2020-07-10 ENCOUNTER — Telehealth: Payer: Self-pay | Admitting: Family Medicine

## 2020-07-10 NOTE — Telephone Encounter (Signed)
Copied from [place] [phone]. Topic: Medicare AWV >> Jul 10, 2020 12:53 PM Cher Nakai R wrote: Reason for CRM:   No answer unable to leave a message for patient to call back and schedule Medicare Annual Wellness Visit (AWV) in office.   If unable to come into the office for AWV,  please offer to do virtually or by telephone.  Last AWV 06/22/2019  Please schedule at anytime with [place].  40 minute appointment  Any questions, please contact me at [phone]

## 2020-07-25 ENCOUNTER — Telehealth: Payer: Self-pay | Admitting: Family Medicine

## 2020-07-25 NOTE — Telephone Encounter (Signed)
Copied from [place] [id number]. Topic: Medicare AWV >> Jul 25, 2020  1:10 PM Cher Nakai R wrote: Reason for CRM:  No answer unable to leave a message for patient to call back and schedule Medicare Annual Wellness Visit (AWV) in office.   If unable to come into the office for AWV,  please offer to do virtually or by telephone.  Last AWV  06/22/2019  Please schedule at anytime with [place].  40 minute appointment  Any questions, please contact me at [phone]

## 2020-07-31 ENCOUNTER — Ambulatory Visit: Payer: Medicare Other | Admitting: Family Medicine

## 2020-08-16 ENCOUNTER — Telehealth: Payer: Self-pay | Admitting: Family Medicine

## 2020-08-16 NOTE — Telephone Encounter (Signed)
Patient declined the Medicare Wellness Visit with [facility]. Stated he does not feel this is necessary in addition to seeing his PCP

## 2020-09-07 ENCOUNTER — Other Ambulatory Visit: Payer: Self-pay | Admitting: Family Medicine

## 2020-09-07 DIAGNOSIS — E782 Mixed hyperlipidemia: Secondary | ICD-10-CM

## 2020-09-08 ENCOUNTER — Ambulatory Visit (INDEPENDENT_AMBULATORY_CARE_PROVIDER_SITE_OTHER): Payer: Medicare Other | Admitting: Physician Assistant

## 2020-09-08 ENCOUNTER — Encounter: Payer: Self-pay | Admitting: Physician Assistant

## 2020-09-08 ENCOUNTER — Other Ambulatory Visit: Payer: Self-pay

## 2020-09-08 VITALS — BP 122/72 | HR 100 | Temp 98.0°F | Resp 16 | Ht 72.0 in | Wt 206.2 lb

## 2020-09-08 DIAGNOSIS — E782 Mixed hyperlipidemia: Secondary | ICD-10-CM

## 2020-09-08 DIAGNOSIS — E1121 Type 2 diabetes mellitus with diabetic nephropathy: Secondary | ICD-10-CM

## 2020-09-08 DIAGNOSIS — I10 Essential (primary) hypertension: Secondary | ICD-10-CM | POA: Diagnosis not present

## 2020-09-08 DIAGNOSIS — R5383 Other fatigue: Secondary | ICD-10-CM | POA: Diagnosis not present

## 2020-09-08 LAB — POCT GLYCOSYLATED HEMOGLOBIN (HGB A1C): Hemoglobin A1C: 7.9 % — AB (ref 4.0–5.6)

## 2020-09-08 NOTE — Progress Notes (Signed)
Established patient visit   Patient: Paul Spears   DOB: May 28, 1946   75 y.o. Male  MRN: [id number] Visit Date: 09/08/2020  Today's healthcare provider: Trinna Post, PA-C   Chief Complaint  Patient presents with  . Follow-up  . Hyperlipidemia  . Hypertension  . Diabetes   Subjective    HPI   Diabetes Mellitus Type II, Follow-up  Lab Results  Component Value Date   HGBA1C 7.9 (A) 09/08/2020   HGBA1C 8.2 (H) 03/14/2020   HGBA1C 7.1 (H) 10/27/2019   Wt Readings from Last 3 Encounters:  09/08/20 206 lb 3.2 oz (93.5 kg)  03/14/20 207 lb 9.6 oz (94.2 kg)  01/28/20 210 lb 6.4 oz (95.4 kg)   Last seen for diabetes 6 months ago.  Management since then includes metformin and glimepiride. He reports poor compliance with treatment. Hasn't been taking glimepiride. He is not having side effects.  Symptoms: No fatigue No foot ulcerations  No appetite changes No nausea  No paresthesia of the feet  No polydipsia  No polyuria No visual disturbances   No vomiting     Home blood sugar records: not checked  Episodes of hypoglycemia? No    Current insulin regiment: none Most Recent Eye Exam: 04/2019 Current exercise: none Current diet habits: well balanced  Pertinent Labs: Lab Results  Component Value Date   CHOL 88 01/28/2020   HDL 40 01/28/2020   LDLCALC 32 01/28/2020   TRIG 76 01/28/2020   CHOLHDL 2.2 01/28/2020   Lab Results  Component Value Date   NA 141 01/28/2020   K 5.1 01/28/2020   CREATININE 1.35 (H) 01/28/2020   GFRNONAA 51 (L) 01/28/2020   GFRAA 60 01/28/2020   GLUCOSE 133 (H) 01/28/2020     --------------------------------------------------------------------------------------------------- Hypertension, follow-up  BP Readings from Last 3 Encounters:  09/08/20 122/72  03/14/20 136/72  01/28/20 138/86   Wt Readings from Last 3 Encounters:  09/08/20 206 lb 3.2 oz (93.5 kg)  03/14/20 207 lb 9.6 oz (94.2 kg)  01/28/20 210 lb 6.4 oz (95.4  kg)     He was last seen for hypertension 6 months ago.  BP at that visit was controlled. Management since that visit includes continue amlodipine 2.5 mg daily.  He reports excellent compliance with treatment. He is not having side effects.  He is following a Regular diet. He is not exercising. He does not smoke.  Use of agents associated with hypertension: none.   Outside blood pressures are not checked. Symptoms: No chest pain No chest pressure  No palpitations No syncope  No dyspnea No orthopnea  No paroxysmal nocturnal dyspnea No lower extremity edema   Pertinent labs: Lab Results  Component Value Date   CHOL 88 01/28/2020   HDL 40 01/28/2020   LDLCALC 32 01/28/2020   TRIG 76 01/28/2020   CHOLHDL 2.2 01/28/2020   Lab Results  Component Value Date   NA 141 01/28/2020   K 5.1 01/28/2020   CREATININE 1.35 (H) 01/28/2020   GFRNONAA 51 (L) 01/28/2020   GFRAA 60 01/28/2020   GLUCOSE 133 (H) 01/28/2020     The ASCVD Risk score (Goff DC Jr., et al., 2013) failed to calculate for the following reasons:   The valid total cholesterol range is 130 to 320 mg/dL   --------------------------------------------------------------------------------------------------- Lipid/Cholesterol, Follow-up  Last lipid panel Other pertinent labs  Lab Results  Component Value Date   CHOL 88 01/28/2020   HDL 40 01/28/2020   LDLCALC 32  01/28/2020   TRIG 76 01/28/2020   CHOLHDL 2.2 01/28/2020   Lab Results  Component Value Date   ALT 15 01/28/2020   AST 16 01/28/2020   PLT 353 03/14/2020   TSH 1.34 06/30/2019     He was last seen for this 6 months ago.  Management since that visit includes continue lipitor 80 mg QHS.  He reports excellent compliance with treatment. He is not having side effects.   Symptoms: No chest pain No chest pressure/discomfort  No dyspnea No lower extremity edema  No numbness or tingling of extremity No orthopnea  No palpitations No paroxysmal nocturnal  dyspnea  No speech difficulty No syncope   Current diet: well balanced Current exercise: none  The ASCVD Risk score ([place]on., et al., 2013) failed to calculate for the following reasons:   The valid total cholesterol range is 130 to 320 mg/dL  ---------------------------------------------------------------------------------------------------  Fatigue: Patient reports he feels fatigued during the day and as if he can fall asleep easily. He also has a history of iron deficiency anemia. Declines sleep study.      Medications: Outpatient Medications Prior to Visit  Medication Sig  . ACCU-CHEK AVIVA PLUS test strip USE 1 STRIP TO CHECK GLUCOSE ONCE DAILY  . albuterol (PROAIR HFA) 108 (90 Base) MCG/ACT inhaler Inhale 2 puffs into the lungs every 4 (four) hours as needed for wheezing or shortness of breath.  Marland Kitchen amLODipine (NORVASC) 2.5 MG tablet Take 2.5 mg by mouth daily.  Marland Kitchen atorvastatin (LIPITOR) 80 MG tablet Take 1 tablet by mouth once daily  . Cholecalciferol (VITAMIN D3 PO) Take 1 capsule by mouth daily.  Marland Kitchen esomeprazole (NEXIUM) 40 MG capsule Take 40 mg by mouth daily.   Marland Kitchen ezetimibe (ZETIA) 10 MG tablet Take 1 tablet by mouth once daily  . fluticasone (FLONASE) 50 MCG/ACT nasal spray Place 2 sprays into both nostrils daily as needed.  Marland Kitchen glimepiride (AMARYL) 1 MG tablet Take 1 tablet (1 mg total) by mouth daily. With breakfast, hold if blood sugar < 100 or not eating  . Lancets (ACCU-CHEK MULTICLIX) lancets USE AS DIRECTED  . levocetirizine (XYZAL) 5 MG tablet TAKE 1 TABLET BY MOUTH ONCE DAILY IN THE EVENING  . metFORMIN (GLUCOPHAGE) 1000 MG tablet Take 1 tablet (1,000 mg total) by mouth 2 (two) times daily with a meal.  . [DISCONTINUED] pioglitazone (ACTOS) 15 MG tablet TAKE 1 TABLET BY MOUTH ONCE DAILY STOP  AND  SEEK  MEDICAL  ATTENTION  IF  WEIGHT  GAIN,  LEG  SWELLING,  SHORTNESS  OF  BREATH (Patient not taking: Reported on 09/08/2020)   No facility-administered medications prior  to visit.    Review of Systems  All other systems reviewed and are negative.      Objective    BP 122/72   Pulse 100   Temp 98 F (36.7 C)   Resp 16   Ht 6' (1.829 m)   Wt 206 lb 3.2 oz (93.5 kg)   SpO2 98%   BMI 27.97 kg/m     Physical Exam Constitutional:      Appearance: Normal appearance.  Cardiovascular:     Rate and Rhythm: Normal rate and regular rhythm.     Heart sounds: Normal heart sounds.  Pulmonary:     Effort: Pulmonary effort is normal.     Breath sounds: Normal breath sounds.  Skin:    General: Skin is warm and dry.  Neurological:     Mental Status: He is  alert and oriented to person, place, and time. Mental status is at baseline.  Psychiatric:        Mood and Affect: Mood normal.        Behavior: Behavior normal.       Results for orders placed or performed in visit on 09/08/20  Urine Microalbumin w/creat. ratio  Result Value Ref Range   Creatinine, Urine 417 (H) 20 - 320 mg/dL   Microalb, Ur 38.4 mg/dL   Microalb Creat Ratio 92 (H) <30 mcg/mg creat  POCT HgB A1C  Result Value Ref Range   Hemoglobin A1C 7.9 (A) 4.0 - 5.6 %   HbA1c POC (<> result, manual entry)     HbA1c, POC (prediabetic range)     HbA1c, POC (controlled diabetic range)      Assessment & Plan    1. Controlled type 2 diabetes mellitus with diabetic nephropathy, without long-term current use of insulin (HCC)  Slightly above goal with last A1c 7.9%. Encouraged compliance with medications. Continue current medications UTD on vaccines, eye exam, foot exam On ACEi On Statin Discussed diet and exercise F/u in 4 months  - POCT HgB A1C - Urine Microalbumin w/creat. ratio - Ambulatory referral to Ophthalmology  2. Mixed hyperlipidemia  Previously well controlled Continue statin Repeat FLP and CMP Goal LDL < 70   3. Primary hypertension   4. Other fatigue    Return in about 6 months (around 03/11/2021) for dm, htn, lipid .        Trinna Post, PA-C   [facility] [phone] (phone) [phone] (fax)  [place]

## 2020-09-08 NOTE — Patient Instructions (Signed)
Diabetes Mellitus and Exercise Exercising regularly is important for overall health, especially for people who have diabetes mellitus. Exercising is not only about losing weight. It has many other health benefits, such as increasing muscle strength and bone density and reducing body fat and stress. This leads to improved fitness, flexibility, and endurance, all of which result in better overall health. What are the benefits of exercise if I have diabetes? Exercise has many benefits for people with diabetes. They include:  Helping to lower and control blood sugar (glucose).  Helping the body to respond better to the hormone insulin by improving insulin sensitivity.  Reducing how much insulin the body needs.  Lowering the risk for heart disease by: ? Lowering "bad" cholesterol and triglyceride levels. ? Increasing "good" cholesterol levels. ? Lowering blood pressure. ? Lowering blood glucose levels. What is my activity plan? Your health care provider or certified diabetes educator can help you make a plan for the type and frequency of exercise that works for you. This is called your activity plan. Be sure to:  Get at least 150 minutes of medium-intensity or high-intensity exercise each week. Exercises may include brisk walking, biking, or water aerobics.  Do stretching and strengthening exercises, such as yoga or weight lifting, at least 2 times a week.  Spread out your activity over at least 3 days of the week.  Get some form of physical activity each day. ? Do not go more than 2 days in a row without some kind of physical activity. ? Avoid being inactive for more than 90 minutes at a time. Take frequent breaks to walk or stretch.  Choose exercises or activities that you enjoy. Set realistic goals.  Start slowly and gradually increase your exercise intensity over time.   How do I manage my diabetes during exercise? Monitor your blood glucose  Check your blood glucose before and  after exercising. If your blood glucose is: ? 240 mg/dL (13.3 mmol/L) or higher before you exercise, check your urine for ketones. These are chemicals created by the liver. If you have ketones in your urine, do not exercise until your blood glucose returns to normal. ? 100 mg/dL (5.6 mmol/L) or lower, eat a snack containing 15-20 grams of carbohydrate. Check your blood glucose 15 minutes after the snack to make sure that your glucose level is above 100 mg/dL (5.6 mmol/L) before you start your exercise.  Know the symptoms of low blood glucose (hypoglycemia) and how to treat it. Your risk for hypoglycemia increases during and after exercise. Follow these tips and your health care provider's instructions  Keep a carbohydrate snack that is fast-acting for use before, during, and after exercise to help prevent or treat hypoglycemia.  Avoid injecting insulin into areas of the body that are going to be exercised. For example, avoid injecting insulin into: ? Your arms, when you are about to play tennis. ? Your legs, when you are about to go jogging.  Keep records of your exercise habits. Doing this can help you and your health care provider adjust your diabetes management plan as needed. Write down: ? Food that you eat before and after you exercise. ? Blood glucose levels before and after you exercise. ? The type and amount of exercise you have done.  Work with your health care provider when you start a new exercise or activity. He or she may need to: ? Make sure that the activity is safe for you. ? Adjust your insulin, other medicines, and food that   you eat.  Drink plenty of water while you exercise. This prevents loss of water (dehydration) and problems caused by a lot of heat in the body (heat stroke).   Where to find more information  American Diabetes Association: www.diabetes.org Summary  Exercising regularly is important for overall health, especially for people who have diabetes  mellitus.  Exercising has many health benefits. It increases muscle strength and bone density and reduces body fat and stress. It also lowers and controls blood glucose.  Your health care provider or certified diabetes educator can help you make an activity plan for the type and frequency of exercise that works for you.  Work with your health care provider to make sure any new activity is safe for you. Also work with your health care provider to adjust your insulin, other medicines, and the food you eat. This information is not intended to replace advice given to you by your health care provider. Make sure you discuss any questions you have with your health care provider. Document Revised: 03/01/2019 Document Reviewed: 03/01/2019 Elsevier Patient Education  2021 Elsevier Inc.  

## 2020-09-09 LAB — MICROALBUMIN / CREATININE URINE RATIO
Creatinine, Urine: 417 mg/dL — ABNORMAL HIGH (ref 20–320)
Microalb Creat Ratio: 92 mcg/mg creat — ABNORMAL HIGH (ref <30)
Microalb, Ur: 38.4 mg/dL

## 2020-09-11 ENCOUNTER — Ambulatory Visit: Payer: Medicare Other | Admitting: Family Medicine

## 2020-10-19 LAB — HM DIABETES EYE EXAM

## 2020-12-03 ENCOUNTER — Other Ambulatory Visit: Payer: Self-pay | Admitting: Family Medicine

## 2020-12-03 DIAGNOSIS — E1121 Type 2 diabetes mellitus with diabetic nephropathy: Secondary | ICD-10-CM

## 2020-12-04 ENCOUNTER — Other Ambulatory Visit: Payer: Self-pay

## 2020-12-04 NOTE — Telephone Encounter (Signed)
Called patient no vm came on. Per Dr pt meds have been called in but needs a appt in the next 3 moinths

## 2020-12-17 ENCOUNTER — Other Ambulatory Visit: Payer: Self-pay | Admitting: Family Medicine

## 2020-12-17 DIAGNOSIS — E782 Mixed hyperlipidemia: Secondary | ICD-10-CM

## 2021-01-17 ENCOUNTER — Other Ambulatory Visit: Payer: Self-pay | Admitting: Family Medicine

## 2021-01-17 DIAGNOSIS — IMO0002 Reserved for concepts with insufficient information to code with codable children: Secondary | ICD-10-CM

## 2021-01-17 DIAGNOSIS — E1122 Type 2 diabetes mellitus with diabetic chronic kidney disease: Secondary | ICD-10-CM

## 2021-02-23 ENCOUNTER — Other Ambulatory Visit: Payer: Self-pay | Admitting: Family Medicine

## 2021-02-23 DIAGNOSIS — E1122 Type 2 diabetes mellitus with diabetic chronic kidney disease: Secondary | ICD-10-CM

## 2021-02-23 DIAGNOSIS — IMO0002 Reserved for concepts with insufficient information to code with codable children: Secondary | ICD-10-CM

## 2021-02-23 DIAGNOSIS — E782 Mixed hyperlipidemia: Secondary | ICD-10-CM

## 2021-02-23 MED ORDER — METFORMIN HCL 1000 MG PO TABS
1000.0000 mg | ORAL_TABLET | Freq: Two times a day (BID) | ORAL | 0 refills | Status: DC
Start: 2021-02-23 — End: 2021-06-15

## 2021-03-02 ENCOUNTER — Encounter: Payer: Self-pay | Admitting: Family Medicine

## 2021-03-02 ENCOUNTER — Other Ambulatory Visit: Payer: Self-pay

## 2021-03-02 ENCOUNTER — Ambulatory Visit (INDEPENDENT_AMBULATORY_CARE_PROVIDER_SITE_OTHER): Payer: Medicare Other | Admitting: Family Medicine

## 2021-03-02 VITALS — BP 128/70 | HR 93 | Temp 97.9°F | Resp 16 | Ht 72.0 in | Wt 210.2 lb

## 2021-03-02 DIAGNOSIS — F321 Major depressive disorder, single episode, moderate: Secondary | ICD-10-CM

## 2021-03-02 DIAGNOSIS — R059 Cough, unspecified: Secondary | ICD-10-CM

## 2021-03-02 DIAGNOSIS — K2100 Gastro-esophageal reflux disease with esophagitis, without bleeding: Secondary | ICD-10-CM

## 2021-03-02 DIAGNOSIS — J45909 Unspecified asthma, uncomplicated: Secondary | ICD-10-CM

## 2021-03-02 DIAGNOSIS — I10 Essential (primary) hypertension: Secondary | ICD-10-CM

## 2021-03-02 DIAGNOSIS — E782 Mixed hyperlipidemia: Secondary | ICD-10-CM

## 2021-03-02 DIAGNOSIS — Z23 Encounter for immunization: Secondary | ICD-10-CM

## 2021-03-02 DIAGNOSIS — N1831 Chronic kidney disease, stage 3a: Secondary | ICD-10-CM

## 2021-03-02 DIAGNOSIS — R5383 Other fatigue: Secondary | ICD-10-CM

## 2021-03-02 DIAGNOSIS — E1121 Type 2 diabetes mellitus with diabetic nephropathy: Secondary | ICD-10-CM

## 2021-03-02 MED ORDER — ALBUTEROL SULFATE HFA 108 (90 BASE) MCG/ACT IN AERS: 2.0000 | INHALATION_SPRAY | RESPIRATORY_TRACT | 2 refills | Status: DC | PRN

## 2021-03-02 MED ORDER — PANTOPRAZOLE SODIUM 40 MG PO TBEC: 40.0000 mg | DELAYED_RELEASE_TABLET | Freq: Every day | ORAL | 3 refills | Status: DC

## 2021-03-02 MED ORDER — ATORVASTATIN CALCIUM 80 MG PO TABS: 80.0000 mg | ORAL_TABLET | Freq: Every day | ORAL | 3 refills | Status: DC

## 2021-03-02 MED ORDER — FAMOTIDINE 20 MG PO TABS: 20.0000 mg | ORAL_TABLET | Freq: Two times a day (BID) | ORAL | 1 refills | Status: DC | PRN

## 2021-03-02 MED ORDER — EZETIMIBE 10 MG PO TABS: 10.0000 mg | ORAL_TABLET | Freq: Every day | ORAL | 3 refills | Status: DC

## 2021-03-02 NOTE — Progress Notes (Signed)
Name: Paul Spears   MRN: [id number]    DOB: 02-19-1946   Date:03/02/2021       Progress Note  Chief Complaint  Patient presents with   Diabetes   Hyperlipidemia   Hypertension     Subjective:   Paul Spears is a 75 y.o. male, presents to clinic for routine f/up last appt 6 months ago with Fabio Bering  DM:  uncontrolled Pt managing DM with metformin, actos, glimepiride  Reports good med compliance Pt has no SE from meds. Blood sugars high- diet poor Denies: Polyuria, polydipsia, vision changes, neuropathy, hypoglycemia Recent pertinent labs: Lab Results  Component Value Date   HGBA1C 7.9 (A) 09/08/2020   HGBA1C 8.2 (H) 03/14/2020   HGBA1C 7.1 (H) 10/27/2019   Lab Results  Component Value Date   MICROALBUR 38.4 09/08/2020   LDLCALC 32 01/28/2020   CREATININE 1.35 (H) 01/28/2020   Standard of care and health maintenance: Urine Microalbumin:  done [place] exam:  done DM eye exam:  done ACEI/ARB:  no Statin:  lipitor  Hypertension:  Currently managed on norvasc Pt reports good med compliance and denies any SE.   Blood pressure today is well controlled. BP Readings from Last 3 Encounters:  03/02/21 128/70  09/08/20 122/72  03/14/20 136/72   Pt denies CP, SOB, exertional sx, LE edema, palpitation, Ha's, visual disturbances, lightheadedness, hypotension, syncope. Dietary efforts for BP?  none  CKD stage 3a - seeing nephrology Lab Results  Component Value Date   CREATININE 1.35 (H) 01/28/2020   Lab Results  Component Value Date   BUN 16 01/28/2020   Lab Results  Component Value Date   GFRNONAA 51 (L) 01/28/2020   Anemia and iron deficiency in the past  Hemoglobin  Date Value Ref Range Status  03/14/2020 12.1 (L) 13.2 - 17.1 g/dL Final  01/28/2020 11.5 (L) 13.2 - 17.1 g/dL Final  10/27/2019 12.4 (L) 13.2 - 17.1 g/dL Final  06/30/2019 12.0 (L) 13.2 - 17.1 g/dL Final   Lab Results  Component Value Date   IRON 111 06/30/2019   TIBC 362 06/30/2019    FERRITIN 27 06/30/2019   ,Hyperlipidemia: Currently treated with lipitor 80 and zetia, pt reports good med compliance Last Lipids: Lab Results  Component Value Date   CHOL 88 01/28/2020   HDL 40 01/28/2020   LDLCALC 32 01/28/2020   TRIG 76 01/28/2020   CHOLHDL 2.2 01/28/2020   - Denies: Chest pain, shortness of breath, myalgias, claudication  Very tired, generlized fatigue, feelsl ike when he was really anemic a year or two ago, denies any bleeding, bruising, petechia  GERD worse on nexium frequent reflux and coughing Wants stronger inhaler due to coughing - on albuterol rescue inhaler Also hx of seasonal allergies     Current Outpatient Medications:    albuterol (PROAIR HFA) 108 (90 Base) MCG/ACT inhaler, Inhale 2 puffs into the lungs every 4 (four) hours as needed for wheezing or shortness of breath., Disp: 18 g, Rfl: 2   amLODipine (NORVASC) 2.5 MG tablet, Take 2.5 mg by mouth daily., Disp: , Rfl:    atorvastatin (LIPITOR) 80 MG tablet, Take 1 tablet by mouth once daily, Disp: 90 tablet, Rfl: 0   Cholecalciferol (VITAMIN D3 PO), Take 1 capsule by mouth daily., Disp: , Rfl:    esomeprazole (NEXIUM) 40 MG capsule, Take 40 mg by mouth daily. , Disp: , Rfl:    ezetimibe (ZETIA) 10 MG tablet, Take 1 tablet by mouth once daily, Disp: 90  tablet, Rfl: 0   fluticasone (FLONASE) 50 MCG/ACT nasal spray, Place 2 sprays into both nostrils daily as needed., Disp: 16 g, Rfl: 11   glimepiride (AMARYL) 1 MG tablet, TAKE 1 TABLET BY MOUTH WITH BREAKFAST ,  HOLD  IF  BLOOD  SUGAR  IS  LESS  THAN  100  OR  NOT  EATING, Disp: 90 tablet, Rfl: 1   glucose blood (ACCU-CHEK AVIVA PLUS) test strip, Use once daily, Disp: 100 strip, Rfl: 0   Lancets (ACCU-CHEK MULTICLIX) lancets, USE AS DIRECTED, Disp: 102 each, Rfl: 12   levocetirizine (XYZAL) 5 MG tablet, TAKE 1 TABLET BY MOUTH ONCE DAILY IN THE EVENING, Disp: 90 tablet, Rfl: 3   metFORMIN (GLUCOPHAGE) 1000 MG tablet, Take 1 tablet (1,000 mg total) by  mouth 2 (two) times daily with a meal., Disp: 90 tablet, Rfl: 0   pioglitazone (ACTOS) 15 MG tablet, Take 15 mg by mouth daily., Disp: , Rfl:   Patient Active Problem List   Diagnosis Date Noted   Iron deficiency anemia 11/11/2017   DDD (degenerative disc disease), cervical 12/07/2015   GERD with esophagitis 09/21/2014   Barrett esophagus 09/21/2014   Cervical osteoarthritis 09/21/2014   Chronic kidney disease (CKD), stage III (moderate) ([place]) 09/21/2014   Controlled type 2 diabetes mellitus with diabetic nephropathy ([place]) 09/21/2014   Microalbuminuria 09/21/2014   Osteopenia 09/21/2014   HTN (hypertension) 05/20/2014   Hyperlipidemia 05/20/2014   Benign hypertensive kidney disease with chronic kidney disease 05/20/2014    Past Surgical History:  Procedure Laterality Date   CARPAL TUNNEL RELEASE Right    COLONOSCOPY  10/26/2012   repeat in 10 years MUS   COLONOSCOPY WITH PROPOFOL N/A 02/08/2019   Procedure: COLONOSCOPY WITH PROPOFOL;  Surgeon: Lollie Sails, MD;  Location: [facility] ENDOSCOPY;  Service: Endoscopy;  Laterality: N/A;   ESOPHAGOGASTRODUODENOSCOPY (EGD) WITH PROPOFOL N/A 10/22/2016   Procedure: ESOPHAGOGASTRODUODENOSCOPY (EGD) WITH PROPOFOL;  Surgeon: Lollie Sails, MD;  Location: [facility] ENDOSCOPY;  Service: Endoscopy;  Laterality: N/A;   ESOPHAGOGASTRODUODENOSCOPY (EGD) WITH PROPOFOL N/A 07/14/2018   Procedure: ESOPHAGOGASTRODUODENOSCOPY (EGD) WITH PROPOFOL;  Surgeon: Lucilla Lame, MD;  Location: [facility] ENDOSCOPY;  Service: Endoscopy;  Laterality: N/A;   FRACTURE SURGERY Left    arm   SHOULDER ARTHROSCOPY WITH SUBACROMIAL DECOMPRESSION AND BICEP TENDON REPAIR Right 04/16/2018   Procedure: ARTHROSCOPIC  BICEP TENOTOMY;  Surgeon: Thornton Park, MD;  Location: [facility] ORS;  Service: Orthopedics;  Laterality: Right;   UPPER ESOPHAGEAL ENDOSCOPIC ULTRASOUND (EUS)  6/15 ue to repeat in 1 year    Family History  Problem Relation Age of Onset   Hypertension Father    Heart  attack Father    Heart disease Sister    Hypertension Sister     Social History   Tobacco Use   Smoking status: Never   Smokeless tobacco: Never  Vaping Use   Vaping Use: Never used  Substance Use Topics   Alcohol use: No   Drug use: No     Allergies  Allergen Reactions   Losartan Potassium Other (See Comments)    hyperkalemia    Health Maintenance  Topic Date Due   Zoster Vaccines- Shingrix (1 of 2) Never done   COVID-19 Vaccine (4 - Booster for Pfizer series) 07/29/2020   HEMOGLOBIN A1C  03/11/2021   FOOT EXAM  09/08/2021   URINE MICROALBUMIN  09/08/2021   OPHTHALMOLOGY EXAM  10/19/2021   TETANUS/TDAP  04/03/2026   COLONOSCOPY (Pts 45-3yr Insurance coverage will need to be confirmed)  02/07/2029   INFLUENZA VACCINE  Completed   Hepatitis C Screening  Completed   PNA vac Low Risk Adult  Completed   HPV VACCINES  Aged Out    Chart Review Today: I personally reviewed active problem list, medication list, allergies, family history, social history, health maintenance, notes from last encounter, lab results, imaging with the patient/caregiver today.   Review of Systems   Objective:   Vitals:   03/02/21 1114  BP: 128/70  Pulse: 93  Resp: 16  Temp: 97.9 F (36.6 C)  SpO2: 97%  Weight: 210 lb 3.2 oz (95.3 kg)  Height: 6' (1.829 m)    Body mass index is 28.51 kg/m.  Physical Exam Vitals and nursing note reviewed.  Constitutional:      General: He is not in acute distress.    Appearance: Normal appearance. He is well-developed. He is not ill-appearing, toxic-appearing or diaphoretic.     Interventions: Face mask in place.  HENT:     Head: Normocephalic and atraumatic.     Jaw: No trismus.     Right Ear: Tympanic membrane, ear canal and external ear normal. There is no impacted cerumen.     Left Ear: Tympanic membrane, ear canal and external ear normal. There is no impacted cerumen.     Nose: Congestion and rhinorrhea present.     Mouth/Throat:      Mouth: Mucous membranes are moist.     Pharynx: Oropharynx is clear. Posterior oropharyngeal erythema present. No oropharyngeal exudate.  Eyes:     General: Lids are normal. No scleral icterus.       Right eye: No discharge.        Left eye: No discharge.     Conjunctiva/sclera: Conjunctivae normal.  Neck:     Trachea: Trachea and phonation normal. No tracheal deviation.  Cardiovascular:     Rate and Rhythm: Normal rate and regular rhythm.     Pulses: Normal pulses.          Radial pulses are 2+ on the right side and 2+ on the left side.       Posterior tibial pulses are 2+ on the right side and 2+ on the left side.     Heart sounds: Normal heart sounds. No murmur heard.   No friction rub. No gallop.  Pulmonary:     Effort: Pulmonary effort is normal. No respiratory distress.     Breath sounds: Normal breath sounds. No stridor. No wheezing, rhonchi or rales.  Abdominal:     General: Bowel sounds are normal. There is no distension.     Palpations: Abdomen is soft.     Tenderness: There is no abdominal tenderness. There is left CVA tenderness. There is no right CVA tenderness or guarding.  Musculoskeletal:        General: No swelling.     Cervical back: Normal range of motion.     Right lower leg: No edema.     Left lower leg: No edema.  Lymphadenopathy:     Cervical: No cervical adenopathy.  Skin:    General: Skin is warm and dry.     Capillary Refill: Capillary refill takes less than 2 seconds.     Coloration: Skin is not jaundiced.     Findings: No bruising, lesion or rash.     Nails: There is no clubbing.  Neurological:     Mental Status: He is alert. Mental status is at baseline.     Cranial Nerves: No dysarthria or  facial asymmetry.     Motor: No tremor or abnormal muscle tone.     Gait: Gait normal.  Psychiatric:        Mood and Affect: Mood normal.        Speech: Speech normal.        Behavior: Behavior normal. Behavior is cooperative.        Assessment & Plan:    1. Controlled type 2 diabetes mellitus with diabetic nephropathy, without long-term current use of insulin (HCC) CMP and CBC reviewed with nephrology care everywhere records Uncontrolled, usually improves with diet efforts, however pt has not been able to achieve better diet habits  Recheck A1C, increase med doses as needed - HgB A1c - Lipid Profile  2. Mixed hyperlipidemia On statin and zetia, tolerating, no myalgias, previously well controlled with high intensity statin, due for recheck of labs - ezetimibe (ZETIA) 10 MG tablet; Take 1 tablet (10 mg total) by mouth daily.  Dispense: 90 tablet; Refill: 3 - atorvastatin (LIPITOR) 80 MG tablet; Take 1 tablet (80 mg total) by mouth daily. At bedtime  Dispense: 90 tablet; Refill: 3 - Lipid Profile  3. Primary hypertension Stable, well controlled today on norvasc BP Readings from Last 3 Encounters:  03/02/21 128/70  09/08/20 122/72  03/14/20 136/72    4. Need for influenza vaccination  - Flu Vaccine QUAD High Dose(Fluad)  5. Gastroesophageal reflux disease with esophagitis, unspecified whether hemorrhage Poorly controlled - may be contributing to his coughing,  - pantoprazole (PROTONIX) 40 MG tablet; Take 1 tablet (40 mg total) by mouth daily.  Dispense: 90 tablet; Refill: 3 - famotidine (PEPCID) 20 MG tablet; Take 1 tablet (20 mg total) by mouth 2 (two) times daily as needed for heartburn or indigestion.  Dispense: 180 tablet; Refill: 1  6. Current moderate episode of major depressive disorder, unspecified whether recurrent [facility]) Depression screen [facility] 2/9 03/02/2021 09/08/2020 03/14/2020  Decreased Interest 2 1 0  Down, Depressed, Hopeless '3 1 1  '$ PHQ - 2 Score '5 2 1  '$ Altered sleeping - 0 -  Tired, decreased energy - 1 -  Change in appetite - 0 -  Feeling bad or failure about yourself  - 0 -  Trouble concentrating - 0 -  Moving slowly or fidgety/restless - 0 -  Suicidal thoughts - 0 -  PHQ-9 Score - 3 -  Difficult doing  work/chores - Not difficult at all -  Some recent data might be hidden    7. Stage 3a chronic kidney disease ([place]) Per specialist   8. Reactive airway disease without complication, unspecified asthma severity, unspecified whether persistent Refill on inhaler,  - albuterol (PROAIR HFA) 108 (90 Base) MCG/ACT inhaler; Inhale 2 puffs into the lungs every 4 (four) hours as needed for wheezing or shortness of breath.  Dispense: 18 g; Refill: 2  9. Cough From allergies?  GERD?    10. Fatigue, unspecified type Likely multifactorial, pt appears to be at baseline, VSS, no significant pallor or concern for bleeding, PHQ is positive but score low and fairly well controlled for him.  Encouraged him to work on GERD, allergy control, diet and close f/up to recheck and see if he needs different inhalers/PFT's? CBC reviewed from specialists - stable   Return in about 1 month (around 04/01/2021) for GERD and cough/reactive airway follow up .   Delsa Grana, PA-C 03/02/21 11:25 AM

## 2021-03-02 NOTE — Patient Instructions (Addendum)
Stop Nexium and start pantoprazole and you can use pepcid as needed for reflux - I hope these changes will improve your cough  We will do a recheck of this soon and will follow up on your coughing and lungs

## 2021-03-03 LAB — LIPID PANEL
Cholesterol: 93 mg/dL
HDL: 37 mg/dL — ABNORMAL LOW
LDL Cholesterol (Calc): 39 mg/dL
Non-HDL Cholesterol (Calc): 56 mg/dL
Total CHOL/HDL Ratio: 2.5 (calc)
Triglycerides: 85 mg/dL

## 2021-03-03 LAB — HEMOGLOBIN A1C
Hgb A1c MFr Bld: 7.9 %{Hb} — ABNORMAL HIGH
Mean Plasma Glucose: 180 mg/dL
eAG (mmol/L): 10 mmol/L

## 2021-03-07 DIAGNOSIS — N2581 Secondary hyperparathyroidism of renal origin: Secondary | ICD-10-CM

## 2021-03-07 HISTORY — DX: Secondary hyperparathyroidism of renal origin: N25.81

## 2021-03-15 ENCOUNTER — Other Ambulatory Visit: Payer: Self-pay

## 2021-03-15 ENCOUNTER — Other Ambulatory Visit: Payer: Self-pay | Admitting: Family Medicine

## 2021-03-15 ENCOUNTER — Telehealth: Payer: Self-pay

## 2021-03-15 DIAGNOSIS — E1121 Type 2 diabetes mellitus with diabetic nephropathy: Secondary | ICD-10-CM

## 2021-03-15 MED ORDER — GLIMEPIRIDE 1 MG PO TABS: 1.0000 mg | ORAL_TABLET | Freq: Two times a day (BID) | ORAL | 1 refills | Status: DC | PRN

## 2021-03-26 NOTE — Telephone Encounter (Signed)
Erroneous encounter

## 2021-03-29 ENCOUNTER — Other Ambulatory Visit: Payer: Self-pay | Admitting: Family Medicine

## 2021-04-12 ENCOUNTER — Ambulatory Visit: Payer: Medicare Other | Admitting: Family Medicine

## 2021-05-28 ENCOUNTER — Telehealth: Payer: Medicare Other | Admitting: Family Medicine

## 2021-05-30 ENCOUNTER — Encounter: Payer: Self-pay | Admitting: Physician Assistant

## 2021-05-30 ENCOUNTER — Ambulatory Visit (INDEPENDENT_AMBULATORY_CARE_PROVIDER_SITE_OTHER): Payer: Medicare Other | Admitting: Physician Assistant

## 2021-05-30 VITALS — BP 130/72 | HR 98 | Temp 97.5°F | Resp 16 | Ht 71.0 in | Wt 200.3 lb

## 2021-05-30 DIAGNOSIS — E1121 Type 2 diabetes mellitus with diabetic nephropathy: Secondary | ICD-10-CM

## 2021-05-30 DIAGNOSIS — I10 Essential (primary) hypertension: Secondary | ICD-10-CM | POA: Diagnosis not present

## 2021-05-30 DIAGNOSIS — Z9189 Other specified personal risk factors, not elsewhere classified: Secondary | ICD-10-CM

## 2021-05-30 DIAGNOSIS — E785 Hyperlipidemia, unspecified: Secondary | ICD-10-CM

## 2021-05-30 HISTORY — DX: Other specified personal risk factors, not elsewhere classified: Z91.89

## 2021-05-30 LAB — POCT GLYCOSYLATED HEMOGLOBIN (HGB A1C): Hemoglobin A1C: 7.6 % — AB (ref 4.0–5.6)

## 2021-05-30 NOTE — Assessment & Plan Note (Addendum)
Chronic T2DM  Current A1c is 7.6% which is improved from previous visit.  Encouraged to monitor sugar and carbs to reduce blood glucose levels Patient provided mixed answers to at home measures when asked about checking blood glucose at home, diet and exercise making targeted plan difficult to arrange.  Given his overall confusion regarding regimen and age it is advisable to keep A1c below 8% to prevent medication management complications and potential hypoglycemia.  Ambulatory referral to Chronic Care Management provided today to assist with management strategies.  Encouraged to walk 30 minutes per day to improve physical activity levels.  Lipid panel, CMP, CBC ordered today for monitoring

## 2021-05-30 NOTE — Progress Notes (Signed)
Established Patient Office Visit  Subjective:  Patient ID: Paul Spears, male    DOB: April 02, 1946  Age: 75 y.o. MRN: [id number]  Introduced myself to the patient as a PA-C and provided education on APPs in clinical practice.    CC:  Chief Complaint  Patient presents with   Follow-up   Diabetes    HPI Paul Spears presents for diabetes, hypertension and hyperlipidemia follow up   Patient is not able to discern and confirm current medications that he is taking and brought a list of medications from March of 2022 today to help with verification.   Reviewed diet, patient states sometimes he gets depressed and sad that he can't eat what he'd like.  Still eating sweets if he can find them. Trying to find "sugar free" options to help replace sweets but complains of diarrhea.  States he is not exercising.   Also has complaint of "sore" on right lower abdomen from several months ago. States he went to another doctor who examined it and gave him medications. States it has lingered since then  Patient reports concerns for memory and getting lost -requests something to help.  Patient states he lives with his sons  Patient reports that he thinks he is depressed due to HTN and T2DM diagnosis. States he is more fatigued, is experiencing food cravings. He did not provide straightforward answers to questions about his mood other than denying irritability.  Denies suicidal thoughts today.   Past Medical History:  Diagnosis Date   Allergy    Barrett's esophagus    Diabetes mellitus without complication (HCC)    Esophagitis    LA grade C   Failure of erection 09/21/2014   GERD (gastroesophageal reflux disease)    Hyperlipidemia    Hypertension    Iron deficiency anemia 11/11/2017   Kidney disease, chronic, stage III (GFR 30-59 ml/min) (HCC)     Past Surgical History:  Procedure Laterality Date   CARPAL TUNNEL RELEASE Right    COLONOSCOPY  10/26/2012   repeat in 10 years MUS    COLONOSCOPY WITH PROPOFOL N/A 02/08/2019   Procedure: COLONOSCOPY WITH PROPOFOL;  Surgeon: Lollie Sails, MD;  Location: [facility] ENDOSCOPY;  Service: Endoscopy;  Laterality: N/A;   ESOPHAGOGASTRODUODENOSCOPY (EGD) WITH PROPOFOL N/A 10/22/2016   Procedure: ESOPHAGOGASTRODUODENOSCOPY (EGD) WITH PROPOFOL;  Surgeon: Lollie Sails, MD;  Location: [facility] ENDOSCOPY;  Service: Endoscopy;  Laterality: N/A;   ESOPHAGOGASTRODUODENOSCOPY (EGD) WITH PROPOFOL N/A 07/14/2018   Procedure: ESOPHAGOGASTRODUODENOSCOPY (EGD) WITH PROPOFOL;  Surgeon: Lucilla Lame, MD;  Location: [facility] ENDOSCOPY;  Service: Endoscopy;  Laterality: N/A;   FRACTURE SURGERY Left    arm   SHOULDER ARTHROSCOPY WITH SUBACROMIAL DECOMPRESSION AND BICEP TENDON REPAIR Right 04/16/2018   Procedure: ARTHROSCOPIC  BICEP TENOTOMY;  Surgeon: Thornton Park, MD;  Location: [facility] ORS;  Service: Orthopedics;  Laterality: Right;   UPPER ESOPHAGEAL ENDOSCOPIC ULTRASOUND (EUS)  6/15 ue to repeat in 1 year    Family History  Problem Relation Age of Onset   Hypertension Father    Heart attack Father    Heart disease Sister    Hypertension Sister     Social History   Socioeconomic History   Marital status: Divorced    Spouse name: Not on file   Number of children: 3   Years of education: Not on file   Highest education level: GED or equivalent  Occupational History   Not on file  Tobacco Use   Smoking status: Never   Smokeless  tobacco: Never  Vaping Use   Vaping Use: Never used  Substance and Sexual Activity   Alcohol use: No   Drug use: No   Sexual activity: Not Currently  Other Topics Concern   Not on file  Social History Narrative   Not on file   Social Determinants of Health   Financial Resource Strain: Not on file  Food Insecurity: Not on file  Transportation Needs: Not on file  Physical Activity: Not on file  Stress: Not on file  Social Connections: Not on file  Intimate Partner Violence: Not on file    Outpatient  Medications Prior to Visit  Medication Sig Dispense Refill   albuterol (PROAIR HFA) 108 (90 Base) MCG/ACT inhaler Inhale 2 puffs into the lungs every 4 (four) hours as needed for wheezing or shortness of breath. 18 g 2   amLODipine (NORVASC) 2.5 MG tablet Take 2.5 mg by mouth daily.     atorvastatin (LIPITOR) 80 MG tablet Take 1 tablet (80 mg total) by mouth daily. At bedtime 90 tablet 3   Cholecalciferol (VITAMIN D3 PO) Take 1 capsule by mouth daily.     ezetimibe (ZETIA) 10 MG tablet Take 1 tablet (10 mg total) by mouth daily. 90 tablet 3   famotidine (PEPCID) 20 MG tablet Take 1 tablet (20 mg total) by mouth 2 (two) times daily as needed for heartburn or indigestion. 180 tablet 1   FARXIGA 10 MG TABS tablet Take 10 mg by mouth every morning.     fluticasone (FLONASE) 50 MCG/ACT nasal spray Place 2 sprays into both nostrils daily as needed. 16 g 11   glimepiride (AMARYL) 1 MG tablet Take 1 tablet (1 mg total) by mouth 2 (two) times daily as needed (with meals). Hold if blood sugars are <100 or if not eating 180 tablet 1   glucose blood (ACCU-CHEK AVIVA PLUS) test strip Use once daily 100 strip 0   Lancets (ACCU-CHEK MULTICLIX) lancets USE AS DIRECTED 102 each 12   levocetirizine (XYZAL) 5 MG tablet TAKE 1 TABLET BY MOUTH ONCE DAILY IN THE EVENING 90 tablet 3   metFORMIN (GLUCOPHAGE) 1000 MG tablet Take 1 tablet (1,000 mg total) by mouth 2 (two) times daily with a meal. 90 tablet 0   pantoprazole (PROTONIX) 40 MG tablet Take 1 tablet (40 mg total) by mouth daily. 90 tablet 3   pioglitazone (ACTOS) 15 MG tablet Take 15 mg by mouth daily.     No facility-administered medications prior to visit.    Allergies  Allergen Reactions   Losartan Potassium Other (See Comments)    hyperkalemia    ROS Review of Systems  Respiratory:  Negative for shortness of breath.   Musculoskeletal:  Negative for arthralgias, myalgias, neck pain and neck stiffness.  Skin:  Positive for wound (right lower  abdomen).  Neurological:  Negative for dizziness, weakness and light-headedness.  Psychiatric/Behavioral:  Negative for confusion.      Objective:    Physical Exam Constitutional:      Appearance: Normal appearance.  Neck:     Trachea: Trachea and phonation normal.  Cardiovascular:     Rate and Rhythm: Normal rate and regular rhythm.     Pulses: Normal pulses.     Heart sounds: Normal heart sounds.  Pulmonary:     Effort: Pulmonary effort is normal.     Breath sounds: Normal breath sounds and air entry. No decreased breath sounds, wheezing, rhonchi or rales.  Abdominal:     General: Bowel sounds  are normal.     Palpations: Abdomen is soft.     Tenderness: There is no abdominal tenderness.    Musculoskeletal:     Right lower leg: No edema.     Left lower leg: No edema.  Neurological:     Mental Status: He is alert.  Psychiatric:        Behavior: Behavior is cooperative.    BP 130/72    Pulse 98    Temp (!) 97.5 F (36.4 C) (Oral)    Resp 16    Ht 5\' 11"  (1.803 m)    Wt 200 lb 4.8 oz (90.9 kg)    SpO2 99%    BMI 27.94 kg/m  Wt Readings from Last 3 Encounters:  05/30/21 200 lb 4.8 oz (90.9 kg)  03/02/21 210 lb 3.2 oz (95.3 kg)  09/08/20 206 lb 3.2 oz (93.5 kg)   MMSE - Mini Mental State Exam 05/30/2021  Orientation to time 5  Orientation to Place 5  Registration 3  Attention/ Calculation 3  Recall 1  Language- name 2 objects 2  Language- repeat 1  Language- follow 3 step command 3  Language- read & follow direction 1  Write a sentence 1  Copy design 1  Total score 26    PHQ9 SCORE ONLY 05/30/2021 03/02/2021 09/08/2020  PHQ-9 Total Score 0 5 3      Health Maintenance Due  Topic Date Due   Zoster Vaccines- Shingrix (1 of 2) Never done   COVID-19 Vaccine (4 - Booster for Pfizer series) 05/23/2020    There are no preventive care reminders to display for this patient.  Lab Results  Component Value Date   TSH 1.34 06/30/2019   Lab Results  Component  Value Date   WBC 5.3 03/14/2020   HGB 12.1 (L) 03/14/2020   HCT 37.2 (L) 03/14/2020   MCV 89.6 03/14/2020   PLT 353 03/14/2020   Lab Results  Component Value Date   NA 141 01/28/2020   K 5.1 01/28/2020   CO2 27 01/28/2020   GLUCOSE 133 (H) 01/28/2020   BUN 16 01/28/2020   CREATININE 1.35 (H) 01/28/2020   BILITOT 0.5 01/28/2020   ALKPHOS 134 (H) 07/07/2018   AST 16 01/28/2020   ALT 15 01/28/2020   PROT 6.1 01/28/2020   ALBUMIN 4.6 01/15/2017   CALCIUM 9.7 01/28/2020   ANIONGAP 8 04/09/2018   Lab Results  Component Value Date   CHOL 93 03/02/2021   Lab Results  Component Value Date   HDL 37 (L) 03/02/2021   Lab Results  Component Value Date   LDLCALC 39 03/02/2021   Lab Results  Component Value Date   TRIG 85 03/02/2021   Lab Results  Component Value Date   CHOLHDL 2.5 03/02/2021   Lab Results  Component Value Date   HGBA1C 7.6 (A) 05/30/2021      Assessment & Plan:    Problem List Items Addressed This Visit       Cardiovascular and Mediastinum   HTN (hypertension)    Chronic hypertension with elevated BP in office Unable to verify patient's current medication regimen due to patient's lack of understanding and mild confusion today.  Patient states his Hypertension is managed by Nephrology and next apt is 06/25/2021 Will coordinate with nephrology to monitor and maintain appropriate BP  Ambulatory referral to Chronic Care Management provided today to assist with management strategies.      Relevant Orders   AMB Referral to [facility]   Controlled type 2 diabetes mellitus with diabetic nephropathy (HCC) - Primary    Chronic T2DM  Current A1c is 7.6% which is improved from previous visit.  Encouraged to monitor sugar and carbs to reduce blood glucose levels Patient provided mixed answers to at home measures when asked about checking blood glucose at home, diet and exercise making targeted plan difficult to arrange.   Given his overall confusion regarding regimen and age it is advisable to keep A1c below 8% to prevent medication management complications and potential hypoglycemia.  Ambulatory referral to Chronic Care Management provided today to assist with management strategies.  Encouraged to walk 30 minutes per day to improve physical activity levels.  Lipid panel, CMP, CBC ordered today for monitoring      Relevant Medications   FARXIGA 10 MG TABS tablet   Other Relevant Orders   POCT HgB A1C (Completed)   COMPLETE METABOLIC PANEL WITH GFR   Lipid Profile   CBC w/Diff/Platelet   AMB Referral to Community Care Coordinaton     Other   Hyperlipidemia    Chronic hyperlipidemia per history and chart review currently managed but difficult to determine current regimen due to patient's uncertainty with his medications Lipid panel ordered today for monitoring.  Encouraged increased exercise and heart healthy diet to assist with maintenance.       Potential for cognitive impairment    Patient presents today with potential concerns and reports indicative of cognitive decline  MMSE performed today with score of 26  Recommend continued follow up and repeat every 3-6 months Recommend patient have reduced complexity medication regimen (combo medications, medication organizer, etc) to reduce risk of medication errors Recommend patient have family members present for future visits to assist with care barriers and improve understanding.  Will examine need for cognitive enhancers at follow ups       At risk for depressed mood    Patient describes intermittent depressed mood and changes to appetite, increased fatigue, food cravings that is consistent with depressed mood.  He denies suicidal thoughts and plan today Recommend continued monitoring at follow up with PHQ 9 and GAD7 to assist  Patient's complaints are consistent with Dx of diabetes but could also be consistent with adjustment issues surrounding  chronic health concerns.  If patient continues to voice concerns, initiating SSRI may be beneficial to improve mood and potentially compliance with lifestyle management for chronic conditions.         I, Shadrick Senne E Charmon Thorson, PA-C, have reviewed all documentation for this visit. The documentation on 05/30/21 for the exam, diagnosis, procedures, and orders are all accurate and complete.   No orders of the defined types were placed in this encounter.   Follow-up: Return in about 3 months (around 08/28/2021) for Diabetes and HTN follow up .    Almon Register, PA-C [facility]

## 2021-05-30 NOTE — Assessment & Plan Note (Signed)
Chronic hyperlipidemia per history and chart review currently managed but difficult to determine current regimen due to patient's uncertainty with his medications Lipid panel ordered today for monitoring.  Encouraged increased exercise and heart healthy diet to assist with maintenance.

## 2021-05-30 NOTE — Patient Instructions (Addendum)
Please bring your medication bottles with you to your next apt to help [place] update our records with your most accurate medications  Please monitor your diet and reduce the amount of sugar and carbohydrates you are consuming to help improve your blood glucose levels and your A1c.   It would likely be beneficial if you brought a family member to your next apt to help answer questions and coordinate your care   I am sending in a referral to our Chronic Care Management team to help make managing your high blood pressure and diabetes easier.

## 2021-05-30 NOTE — Assessment & Plan Note (Signed)
Patient presents today with potential concerns and reports indicative of cognitive decline  MMSE performed today with score of 26  Recommend continued follow up and repeat every 3-6 months Recommend patient have reduced complexity medication regimen (combo medications, medication organizer, etc) to reduce risk of medication errors Recommend patient have family members present for future visits to assist with care barriers and improve understanding.  Will examine need for cognitive enhancers at follow ups

## 2021-05-30 NOTE — Assessment & Plan Note (Addendum)
Patient describes intermittent depressed mood and changes to appetite, increased fatigue, food cravings that is consistent with depressed mood.  He denies suicidal thoughts and plan today Recommend continued monitoring at follow up with PHQ 9 and GAD7 to assist  Patient's complaints are consistent with Dx of diabetes but could also be consistent with adjustment issues surrounding chronic health concerns.  If patient continues to voice concerns, initiating SSRI may be beneficial to improve mood and potentially compliance with lifestyle management for chronic conditions.

## 2021-05-30 NOTE — Assessment & Plan Note (Addendum)
Chronic hypertension with elevated BP in office Unable to verify patient's current medication regimen due to patient's lack of understanding and mild confusion today.  Patient states his Hypertension is managed by Nephrology and next apt is 06/25/2021 Will coordinate with nephrology to monitor and maintain appropriate BP  Ambulatory referral to Chronic Care Management provided today to assist with management strategies.

## 2021-05-31 LAB — CBC WITH DIFFERENTIAL/PLATELET
Absolute Monocytes: 647 cells/uL (ref 200–950)
Basophils Absolute: 79 cells/uL (ref 0–200)
Basophils Relative: 1.2 %
Eosinophils Absolute: 317 cells/uL (ref 15–500)
Eosinophils Relative: 4.8 %
HCT: 36 % — ABNORMAL LOW (ref 38.5–50.0)
Hemoglobin: 11.7 g/dL — ABNORMAL LOW (ref 13.2–17.1)
Lymphs Abs: 2105 cells/uL (ref 850–3900)
MCH: 27 pg (ref 27.0–33.0)
MCHC: 32.5 g/dL (ref 32.0–36.0)
MCV: 83.1 fL (ref 80.0–100.0)
MPV: 10.5 fL (ref 7.5–12.5)
Monocytes Relative: 9.8 %
Neutro Abs: 3452 cells/uL (ref 1500–7800)
Neutrophils Relative %: 52.3 %
Platelets: 412 10*3/uL — ABNORMAL HIGH (ref 140–400)
RBC: 4.33 10*6/uL (ref 4.20–5.80)
RDW: 14.4 % (ref 11.0–15.0)
Total Lymphocyte: 31.9 %
WBC: 6.6 10*3/uL (ref 3.8–10.8)

## 2021-05-31 LAB — COMPLETE METABOLIC PANEL WITHOUT GFR
AG Ratio: 1.8 (calc) (ref 1.0–2.5)
ALT: 14 U/L (ref 9–46)
AST: 16 U/L (ref 10–35)
Albumin: 4.5 g/dL (ref 3.6–5.1)
Alkaline phosphatase (APISO): 115 U/L (ref 35–144)
BUN/Creatinine Ratio: 12 (calc) (ref 6–22)
BUN: 16 mg/dL (ref 7–25)
CO2: 23 mmol/L (ref 20–32)
Calcium: 9.7 mg/dL (ref 8.6–10.3)
Chloride: 105 mmol/L (ref 98–110)
Creat: 1.32 mg/dL — ABNORMAL HIGH (ref 0.70–1.28)
Globulin: 2.5 g/dL (ref 1.9–3.7)
Glucose, Bld: 133 mg/dL — ABNORMAL HIGH (ref 65–99)
Potassium: 5 mmol/L (ref 3.5–5.3)
Sodium: 138 mmol/L (ref 135–146)
Total Bilirubin: 0.7 mg/dL (ref 0.2–1.2)
Total Protein: 7 g/dL (ref 6.1–8.1)
eGFR: 56 mL/min/1.73m2 — ABNORMAL LOW

## 2021-05-31 LAB — LIPID PANEL
Cholesterol: 104 mg/dL
HDL: 37 mg/dL — ABNORMAL LOW
LDL Cholesterol (Calc): 50 mg/dL
Non-HDL Cholesterol (Calc): 67 mg/dL
Total CHOL/HDL Ratio: 2.8 (calc)
Triglycerides: 86 mg/dL

## 2021-06-02 ENCOUNTER — Other Ambulatory Visit: Payer: Self-pay | Admitting: Family Medicine

## 2021-06-02 DIAGNOSIS — E1121 Type 2 diabetes mellitus with diabetic nephropathy: Secondary | ICD-10-CM

## 2021-06-03 NOTE — Telephone Encounter (Signed)
last RF 03/15/21 #180 1 RF too soon  Requested Prescriptions  Refused Prescriptions Disp Refills   glimepiride (AMARYL) 1 MG tablet [Pharmacy Med Name: Glimepiride 1 MG Oral Tablet] 90 tablet 0    Sig: TAKE ONE TABLET BY MOUTH WITH BREAKFAST , HOLD IF BLOOD SUGAR IS LESS THAN 100 OR NOT EATING     Endocrinology:  Diabetes - Sulfonylureas Passed - 06/02/2021 11:01 AM      Passed - HBA1C is between 0 and 7.9 and within 180 days    Hemoglobin A1C  Date Value Ref Range Status  05/30/2021 7.6 (A) 4.0 - 5.6 % Final   Hgb A1c MFr Bld  Date Value Ref Range Status  03/02/2021 7.9 (H) <5.7 % of total Hgb Final    Comment:    For someone without known diabetes, a hemoglobin A1c value of 6.5% or greater indicates that they may have  diabetes and this should be confirmed with a follow-up  test. . For someone with known diabetes, a value <7% indicates  that their diabetes is well controlled and a value  greater than or equal to 7% indicates suboptimal  control. A1c targets should be individualized based on  duration of diabetes, age, comorbid conditions, and  other considerations. . Currently, no consensus exists regarding use of hemoglobin A1c for diagnosis of diabetes for children. Renella Cunas - Valid encounter within last 6 months    Recent Outpatient Visits          4 days ago Controlled type 2 diabetes mellitus with diabetic nephropathy, without long-term current use of insulin ([place])   [facility] Mecum, Erin E, PA-C   3 months ago Controlled type 2 diabetes mellitus with diabetic nephropathy, without long-term current use of insulin [facility])   [facility] [place], Kristeen Miss, PA-C   8 months ago Controlled type 2 diabetes mellitus with diabetic nephropathy, without long-term current use of insulin [facility])   [facility] [place], [place] M, [place]   1 year ago Iron deficiency anemia, unspecified iron deficiency anemia type   [facility] Delsa Grana, PA-C   1 year ago Controlled type 2 diabetes mellitus with diabetic nephropathy, without long-term current use of insulin [facility])   [facility] Delsa Grana, PA-C      Future Appointments            In 2 months Mecum, Dani Gobble, PA-C [facility], [facility]

## 2021-06-14 ENCOUNTER — Other Ambulatory Visit: Payer: Self-pay | Admitting: Family Medicine

## 2021-06-16 ENCOUNTER — Other Ambulatory Visit: Payer: Self-pay | Admitting: Family Medicine

## 2021-06-16 DIAGNOSIS — E1121 Type 2 diabetes mellitus with diabetic nephropathy: Secondary | ICD-10-CM

## 2021-06-16 NOTE — Telephone Encounter (Signed)
Pharmacy is closed and attempted to reach pt and busy signal on both attempts. Pt should have the refill left.

## 2021-06-28 ENCOUNTER — Telehealth: Payer: Self-pay | Admitting: *Deleted

## 2021-06-28 NOTE — Chronic Care Management (AMB) (Signed)
°  Chronic Care Management   Outreach Note  06/28/2021 Name: Paul Spears MRN: [id number] DOB: 1945-08-30  Paul Spears is a 76 y.o. year old male who is a primary care patient of Delsa Grana, [place]. I reached out to [name] by phone today in response to a referral sent by Paul Spears's primary care provider.  An unsuccessful telephone outreach was attempted today. The patient was referred to the case management team for assistance with care management and care coordination.   Follow Up Plan: If patient returns call to provider office, please advise to call Embedded Care Management Care Guide Carlyn Lemke at 06/28/2021  Julian Hy, [place] Management  Direct Dial: [phone]

## 2021-07-02 NOTE — Chronic Care Management (AMB) (Signed)
°  Chronic Care Management   Outreach Note  07/02/2021 Name: Paul Spears MRN: [id number] DOB: 1945/07/13  Paul Spears is a 76 y.o. year old male who is a primary care patient of Delsa Grana, [place]. I reached out to [name] by phone today in response to a referral sent by Paul Spears's primary care provider.  A second unsuccessful telephone outreach was attempted today. The patient was referred to the case management team for assistance with care management and care coordination.   Follow Up Plan: A HIPAA compliant phone message was left for the patient providing contact information and requesting a return call.  If patient returns call to provider office, please advise to call Embedded Care Management Care Guide Sophiya Morello at [place], [place] Management  Direct Dial: [phone]

## 2021-07-05 NOTE — Chronic Care Management (AMB) (Signed)
°  Care Management   Outreach Note  07/05/2021 Name: Paul Spears MRN: [id number] DOB: [birth year]  Referred by: Delsa Grana, PA-C Reason for referral : Chronic Care Management (Initial outreach to schedule referral with Licensed Clinical SW and RNCM )   Third unsuccessful telephone outreach was attempted today. The patient was referred to the case management team for assistance with care management and care coordination. The patient's primary care provider has been notified of our unsuccessful attempts to make or maintain contact with the patient. The care management team is pleased to engage with this patient at any time in the future should he/she be interested in assistance from the care management team.   Follow Up Plan:  We have been unable to make contact with the patient for follow up. The care management team is available to follow up with the patient after provider conversation with the patient regarding recommendation for care management engagement and subsequent re-referral to the care management team.   Julian Hy, [place] Management  Direct Dial: [phone]

## 2021-07-14 ENCOUNTER — Other Ambulatory Visit: Payer: Self-pay | Admitting: Family Medicine

## 2021-07-14 DIAGNOSIS — Z9109 Other allergy status, other than to drugs and biological substances: Secondary | ICD-10-CM

## 2021-07-15 NOTE — Telephone Encounter (Signed)
Requested medication (s) are due for refill today: expired medication  Requested medication (s) are on the active medication list: yes  Last refill:  06/26/20 #90 3 refills  Future visit scheduled: yes in 1 month  Notes to clinic:  expired medication. Do you want to renew Rx?     Requested Prescriptions  Pending Prescriptions Disp Refills   levocetirizine (XYZAL) 5 MG tablet [Pharmacy Med Name: Levocetirizine Dihydrochloride 5 MG Oral Tablet] 90 tablet 0    Sig: TAKE 1 TABLET BY MOUTH ONCE DAILY IN THE EVENING     Ear, Nose, and Throat:  Antihistamines Passed - 07/14/2021  6:45 PM      Passed - Valid encounter within last 12 months    Recent Outpatient Visits           1 month ago Controlled type 2 diabetes mellitus with diabetic nephropathy, without long-term current use of insulin ([place])   [facility] Mecum, Erin E, PA-C   4 months ago Controlled type 2 diabetes mellitus with diabetic nephropathy, without long-term current use of insulin [facility])   [facility] [place], Kristeen Miss, PA-C   10 months ago Controlled type 2 diabetes mellitus with diabetic nephropathy, without long-term current use of insulin [facility])   [facility] [employer], [place] M, [place]   1 year ago Iron deficiency anemia, unspecified iron deficiency anemia type   [facility] Delsa Grana, PA-C   1 year ago Controlled type 2 diabetes mellitus with diabetic nephropathy, without long-term current use of insulin [facility])   [facility] Delsa Grana, PA-C       Future Appointments             In 1 month Mecum, Dani Gobble, PA-C [facility], [facility]

## 2021-08-07 ENCOUNTER — Other Ambulatory Visit: Payer: Self-pay | Admitting: Physician Assistant

## 2021-08-08 NOTE — Telephone Encounter (Signed)
Requested medication (s) are due for refill today:   Yes   Not enough to last 3 months at twice a day  Requested medication (s) are on the active medication list:   Yes  Future visit scheduled:   Yes   Last ordered: 06/15/2021 #90, 0 refills  Returned because protocol criteria not met.   Labs needed.   Requested Prescriptions  Pending Prescriptions Disp Refills   metFORMIN (GLUCOPHAGE) 1000 MG tablet [Pharmacy Med Name: metFORMIN HCl 1000 MG Oral Tablet] 90 tablet 0    Sig: TAKE 1 TABLET BY MOUTH TWICE DAILY WITH  A  MEAL     Endocrinology:  Diabetes - Biguanides Failed - 08/07/2021  1:39 PM      Failed - Cr in normal range and within 360 days    Creat  Date Value Ref Range Status  05/30/2021 1.32 (H) 0.70 - 1.28 mg/dL Final   Creatinine, Urine  Date Value Ref Range Status  09/08/2020 417 (H) 20 - 320 mg/dL Final    Comment:    Verified by repeat analysis. .           Failed - eGFR in normal range and within 360 days    GFR, Est African American  Date Value Ref Range Status  01/28/2020 60 > OR = 60 mL/min/1.85m Final   GFR, Est Non African American  Date Value Ref Range Status  01/28/2020 51 (L) > OR = 60 mL/min/1.714mFinal   eGFR  Date Value Ref Range Status  05/30/2021 56 (L) > OR = 60 mL/min/1.7344minal    Comment:    The eGFR is based on the CKD-EPI 2021 equation. To calculate  the new eGFR from a previous Creatinine or Cystatin C result, go to https://www.kidney.org/professionals/ kdoqi/gfr%5Fcalculator           Failed - B12 Level in normal range and within 720 days    Vitamin B-12  Date Value Ref Range Status  03/26/2019 339 200 - 1,100 pg/mL Final    Comment:    . Please Note: Although the reference range for vitamin B12 is [phone] pg/mL, it has been reported that between 5 and 10% of patients with values between 200 and 400 pg/mL may experience neuropsychiatric and hematologic abnormalities due to occult B12 deficiency; less than 1% of  patients with values above 400 pg/mL will have symptoms. .           Passed - HBA1C is between 0 and 7.9 and within 180 days    Hemoglobin A1C  Date Value Ref Range Status  05/30/2021 7.6 (A) 4.0 - 5.6 % Final   Hgb A1c MFr Bld  Date Value Ref Range Status  03/02/2021 7.9 (H) <5.7 % of total Hgb Final    Comment:    For someone without known diabetes, a hemoglobin A1c value of 6.5% or greater indicates that they may have  diabetes and this should be confirmed with a follow-up  test. . For someone with known diabetes, a value <7% indicates  that their diabetes is well controlled and a value  greater than or equal to 7% indicates suboptimal  control. A1c targets should be individualized based on  duration of diabetes, age, comorbid conditions, and  other considerations. . Currently, no consensus exists regarding use of hemoglobin A1c for diagnosis of diabetes for children. .  Renella CunasValid encounter within last 6 months    Recent Outpatient Visits  2 months ago Controlled type 2 diabetes mellitus with diabetic nephropathy, without long-term current use of insulin ([place])   [facility] Mecum, Erin E, PA-C   5 months ago Controlled type 2 diabetes mellitus with diabetic nephropathy, without long-term current use of insulin [facility])   [facility] Delsa Grana, PA-C   11 months ago Controlled type 2 diabetes mellitus with diabetic nephropathy, without long-term current use of insulin [facility])   [facility] [place], Adriana M, [place]   1 year ago Iron deficiency anemia, unspecified iron deficiency anemia type   [facility] Delsa Grana, PA-C   1 year ago Controlled type 2 diabetes mellitus with diabetic nephropathy, without long-term current use of insulin ([place])   [facility] Delsa Grana, PA-C       Future Appointments             In 3 weeks Mecum, Dani Gobble, PA-C  [facility], [place] within normal limits and completed in the last 12 months    WBC  Date Value Ref Range Status  05/30/2021 6.6 3.8 - 10.8 Thousand/uL Final   RBC  Date Value Ref Range Status  05/30/2021 4.33 4.20 - 5.80 Million/uL Final   Hemoglobin  Date Value Ref Range Status  05/30/2021 11.7 (L) 13.2 - 17.1 g/dL Final   HCT  Date Value Ref Range Status  05/30/2021 36.0 (L) 38.5 - 50.0 % Final   MCHC  Date Value Ref Range Status  05/30/2021 32.5 32.0 - 36.0 g/dL Final   [facility]  Date Value Ref Range Status  05/30/2021 27.0 27.0 - 33.0 pg Final   MCV  Date Value Ref Range Status  05/30/2021 83.1 80.0 - 100.0 fL Final   No results found for: PLTCOUNTKUC, LABPLAT, POCPLA RDW  Date Value Ref Range Status  05/30/2021 14.4 11.0 - 15.0 % Final

## 2021-08-29 ENCOUNTER — Encounter: Payer: Self-pay | Admitting: Physician Assistant

## 2021-08-29 ENCOUNTER — Other Ambulatory Visit: Payer: Self-pay

## 2021-08-29 ENCOUNTER — Ambulatory Visit (INDEPENDENT_AMBULATORY_CARE_PROVIDER_SITE_OTHER): Payer: Medicare Other | Admitting: Physician Assistant

## 2021-08-29 VITALS — BP 132/74 | HR 93 | Temp 97.0°F | Resp 16 | Ht 71.0 in | Wt 197.3 lb

## 2021-08-29 DIAGNOSIS — E1121 Type 2 diabetes mellitus with diabetic nephropathy: Secondary | ICD-10-CM

## 2021-08-29 DIAGNOSIS — I10 Essential (primary) hypertension: Secondary | ICD-10-CM

## 2021-08-29 DIAGNOSIS — Z9189 Other specified personal risk factors, not elsewhere classified: Secondary | ICD-10-CM

## 2021-08-29 NOTE — Assessment & Plan Note (Signed)
Unsure of chronicity but appears to be becoming chronic  ?Patient reports concerns for memory issues and has generalized tangential thoughts ?He also seems to have generalized confusion and reduced understanding of his medications and overall health status. ?Reduced reflexes and confusion could be associated with B12 deficiency so will check that today ?Previous MMSE was 26 - recommend repeat at next follow up and discussed with patient that he should bring a family member or trusted friend to apt to discuss this and coordinate care.  ?Patient mentioned being depressed, tired and having joint pain- cannot rule out somatic depression symptoms either  ?Recommend follow up with CCM services to assist with teasing out mental health concerns vs environmental issues.  ?Recommend discussion of cognitive enhancers at next apt if lab work is not indicative of other etiology for confusion  ?Follow up in 3-6 months ? ?

## 2021-08-29 NOTE — Patient Instructions (Addendum)
Please continue to take your medications as directed ?I am sending in a referral for Community Care Management to help you with managing your medications and to help with your memory concerns ?I recommend you come back with a family member or friend to discuss your memory concerns so we can start trying to manage them with a good support system.  ? ?We will keep you updated on the results of your lab work as it becomes available.  ?

## 2021-08-29 NOTE — Assessment & Plan Note (Signed)
Chronic, historic condition ?Currently patient is taking Actos 15 mg, Metformin 1000 mg PO BID, Glimepiride '1mg'$  PO BID, Farxiga '10mg'$  for management  ?He reports compliance with these medications and states he is checking blood glucose levels daily in the AM, running in the 120s ?Recommend that patient goal of A1c <8% be implemented given his overall lack of understanding concerning his medications and potential MCI  ?Will refer to CCM to assist with medication management  ?A1c, CMP, ordered today ?Follow up in 3 months ? ?

## 2021-08-29 NOTE — Assessment & Plan Note (Signed)
Chronic, historic condition ?BP is within acceptable range today in office and patient reports compliance with medication regimen consisting of Amlodipine 2.5 mg ?Denies lightheadedness, dizziness, orthostatic changes ?Recommend he continue with current regimen ?Follow up in 3-6 months ? ?

## 2021-08-29 NOTE — Progress Notes (Signed)
? ?Established Patient Office Visit ? ?Subjective:  ?Patient ID: Paul Spears, male    DOB: September 07, 1945  Age: 76 y.o. MRN: [id number] ? ?Today's Provider: Talitha Givens, MHS, PA-C ?Introduced myself to the patient as a Journalist, newspaper and provided education on APPs in clinical practice.  ? ? ?CC:  ?Chief Complaint  ?Patient presents with  ? Follow-up  ? Diabetes  ? Hypertension  ? ? ?HPI ?Paul Spears Hearing presents for follow up for chronic condition management and concern for memory problems ? ?Diabetes: he is checking blood glucose levels at home. States he has had a few readings around 190 but was not very confident of this response. States he usually takes in the AM and its usually in the 120s  ?Diet: Follows a mostly vegetarian diet but does eat some meat, "I've cut back on my french fries" ?Exercise: Not currently in a regimen ? ?Hypertension: States he is taking his BP medications. Denies concerns for low BP, headaches, lightheadedness. ? ?Memory concerns: States he went to get gas and had to go in to prepay. Then went back to his car and drove off without getting gas. States he gets lost at night when driving but also reports cataracts ?He states he has depression due to family concerns - intermittent family assistance/ support based on his description.  ? ?States he forgot his hearing aid ?Denies sleep concerns  ? ?Reports he has mild SOB upon waking but is not using his inhaler.  ? ? ? ?Past Medical History:  ?Diagnosis Date  ? Allergy   ? Barrett's esophagus   ? Diabetes mellitus without complication ([place])   ? Esophagitis   ? LA grade C  ? Failure of erection 09/21/2014  ? GERD (gastroesophageal reflux disease)   ? Hyperlipidemia   ? Hypertension   ? Iron deficiency anemia 11/11/2017  ? Kidney disease, chronic, stage III (GFR 30-59 ml/min) (HCC)   ? ? ?Past Surgical History:  ?Procedure Laterality Date  ? CARPAL TUNNEL RELEASE Right   ? COLONOSCOPY  10/26/2012  ? repeat in 10 years MUS  ? COLONOSCOPY WITH PROPOFOL N/A  02/08/2019  ? Procedure: COLONOSCOPY WITH PROPOFOL;  Surgeon: Lollie Sails, MD;  [facility] ENDOSCOPY;  Service: Endoscopy;  Laterality: N/A;  ? ESOPHAGOGASTRODUODENOSCOPY (EGD) WITH PROPOFOL N/A 10/22/2016  ? Procedure: ESOPHAGOGASTRODUODENOSCOPY (EGD) WITH PROPOFOL;  Surgeon: Lollie Sails, MD;  Location[facility] ENDOSCOPY;  Service: Endoscopy;  Laterality: N/A;  ? ESOPHAGOGASTRODUODENOSCOPY (EGD) WITH PROPOFOL N/A 07/14/2018  ? Procedure: ESOPHAGOGASTRODUODENOSCOPY (EGD) WITH PROPOFOL;  Surgeon: Lucilla Lame, MD;  Location: [facility] ENDOSCOPY;  Service: Endoscopy;  Laterality: N/A;  ? FRACTURE SURGERY Left   ? arm  ? SHOULDER ARTHROSCOPY WITH SUBACROMIAL DECOMPRESSION AND BICEP TENDON REPAIR Right 04/16/2018  ? Procedure: ARTHROSCOPIC  BICEP TENOTOMY;  Surgeon: Thornton Park, MD;  Location: [facility] ORS;  Service: Orthopedics;  Laterality: Right;  ? UPPER ESOPHAGEAL ENDOSCOPIC ULTRASOUND (EUS)  6/15 ue to repeat in 1 year  ? ? ?Family History  ?Problem Relation Age of Onset  ? Hypertension Father   ? Heart attack Father   ? Heart disease Sister   ? Hypertension Sister   ? ? ?Social History  ? ?Socioeconomic History  ? Marital status: Divorced  ?  Spouse name: Not on file  ? Number of children: 3  ? Years of education: Not on file  ? Highest education level: GED or equivalent  ?Occupational History  ? Not on file  ?Tobacco Use  ? Smoking status: Never  ?  Smokeless tobacco: Never  ?Vaping Use  ? Vaping Use: Never used  ?Substance and Sexual Activity  ? Alcohol use: No  ? Drug use: No  ? Sexual activity: Not Currently  ?Other Topics Concern  ? Not on file  ?Social History Narrative  ? Not on file  ? ?Social Determinants of Health  ? ?Financial Resource Strain: Not on file  ?Food Insecurity: Not on file  ?Transportation Needs: Not on file  ?Physical Activity: Not on file  ?Stress: Not on file  ?Social Connections: Not on file  ?Intimate Partner Violence: Not on file  ? ? ?Outpatient Medications Prior to Visit   ?Medication Sig Dispense Refill  ? albuterol (PROAIR HFA) 108 (90 Base) MCG/ACT inhaler Inhale 2 puffs into the lungs every 4 (four) hours as needed for wheezing or shortness of breath. 18 g 2  ? amLODipine (NORVASC) 2.5 MG tablet Take 2.5 mg by mouth daily.    ? atorvastatin (LIPITOR) 80 MG tablet Take 1 tablet (80 mg total) by mouth daily. At bedtime 90 tablet 3  ? Cholecalciferol (VITAMIN D3 PO) Take 1 capsule by mouth daily.    ? ezetimibe (ZETIA) 10 MG tablet Take 1 tablet (10 mg total) by mouth daily. 90 tablet 3  ? famotidine (PEPCID) 20 MG tablet Take 1 tablet (20 mg total) by mouth 2 (two) times daily as needed for heartburn or indigestion. 180 tablet 1  ? FARXIGA 10 MG TABS tablet Take 10 mg by mouth every morning.    ? fluticasone (FLONASE) 50 MCG/ACT nasal spray Place 2 sprays into both nostrils daily as needed. 16 g 11  ? glimepiride (AMARYL) 1 MG tablet Take 1 tablet (1 mg total) by mouth 2 (two) times daily as needed (with meals). Hold if blood sugars are <100 or if not eating 180 tablet 1  ? glucose blood (ACCU-CHEK AVIVA PLUS) test strip Use once daily 100 strip 0  ? Lancets (ACCU-CHEK MULTICLIX) lancets USE AS DIRECTED 102 each 12  ? levocetirizine (XYZAL) 5 MG tablet TAKE 1 TABLET BY MOUTH ONCE DAILY IN THE EVENING 90 tablet 0  ? metFORMIN (GLUCOPHAGE) 1000 MG tablet TAKE 1 TABLET BY MOUTH TWICE DAILY WITH A MEAL 90 tablet 0  ? pantoprazole (PROTONIX) 40 MG tablet Take 1 tablet (40 mg total) by mouth daily. 90 tablet 3  ? pioglitazone (ACTOS) 15 MG tablet Take 15 mg by mouth daily.    ? ?No facility-administered medications prior to visit.  ? ? ?Allergies  ?Allergen Reactions  ? Losartan Potassium Other (See Comments)  ?  hyperkalemia  ? ? ?MMSE - Mini Mental State Exam 05/30/2021  ?Orientation to time 5  ?Orientation to Place 5  ?Registration 3  ?Attention/ Calculation 3  ?Recall 1  ?Language- name 2 objects 2  ?Language- repeat 1  ?Language- follow 3 step command 3  ?Language- read & follow  direction 1  ?Write a sentence 1  ?Copy design 1  ?Total score 26  ? ? ?Depression screen [facility] 2/9 08/29/2021 05/30/2021 03/02/2021 09/08/2020 03/14/2020  ?Decreased Interest 1 0 2 1 0  ?Down, Depressed, Hopeless 0 0 '3 1 1  '$ ?PHQ - 2 Score 1 0 '5 2 1  '$ ?Altered sleeping 0 0 - 0 -  ?Tired, decreased energy 0 0 - 1 -  ?Change in appetite 0 0 - 0 -  ?Feeling bad or failure about yourself  0 0 - 0 -  ?Trouble concentrating 0 0 - 0 -  ?Moving slowly or  fidgety/restless 0 0 - 0 -  ?Suicidal thoughts 0 0 - 0 -  ?PHQ-9 Score 1 0 - 3 -  ?Difficult doing work/chores Not difficult at all Not difficult at all - Not difficult at all -  ?Some recent data might be hidden  ?  ? ?ROS ?Review of Systems  ?Respiratory:  Negative for cough and shortness of breath.   ?Cardiovascular:  Negative for chest pain and palpitations.  ?Neurological:  Negative for dizziness, light-headedness and headaches.  ?Psychiatric/Behavioral:  Positive for confusion.   ? ?  ?Objective:  ?  ?Physical Exam ?Constitutional:   ?   General: He is awake.  ?   Appearance: Normal appearance. He is well-developed, well-groomed and overweight.  ?HENT:  ?   Head: Normocephalic and atraumatic.  ?   Mouth/Throat:  ?   Mouth: Mucous membranes are moist.  ?   Tongue: No lesions.  ?   Pharynx: Oropharynx is clear. Uvula midline. No pharyngeal swelling, oropharyngeal exudate or posterior oropharyngeal erythema.  ?Neurological:  ?   General: No focal deficit present.  ?   Mental Status: He is alert.  ?   Deep Tendon Reflexes:  ?   Reflex Scores: ?     Tricep reflexes are 1+ on the right side and 1+ on the left side. ?     Patellar reflexes are 0 on the right side and 0 on the left side. ?Psychiatric:     ?   Attention and Perception: He is inattentive.     ?   Mood and Affect: Affect normal. Mood is depressed.     ?   Speech: Speech is delayed and tangential.     ?   Behavior: Behavior normal. Behavior is cooperative.     ?   Cognition and Memory: Cognition is impaired.  ? ? ?BP  132/74   Pulse 93   Temp (!) 97 ?F (36.1 ?C) (Oral)   Resp 16   Ht '5\' 11"'$  (1.803 m)   Wt 197 lb 4.8 oz (89.5 kg)   SpO2 99%   BMI 27.52 kg/m?  ?Wt Readings from Last 3 Encounters:  ?08/29/21 197 lb 4.8 oz (89.5 kg)

## 2021-08-30 ENCOUNTER — Telehealth: Payer: Self-pay | Admitting: *Deleted

## 2021-08-30 LAB — HEMOGLOBIN A1C
Hgb A1c MFr Bld: 7.9 % of total Hgb — ABNORMAL HIGH (ref <5.7)
Mean Plasma Glucose: 180 mg/dL
eAG (mmol/L): 10 mmol/L

## 2021-08-30 LAB — COMPREHENSIVE METABOLIC PANEL
AG Ratio: 1.9 (calc) (ref 1.0–2.5)
ALT: 10 U/L (ref 9–46)
AST: 16 U/L (ref 10–35)
Albumin: 4.5 g/dL (ref 3.6–5.1)
Alkaline phosphatase (APISO): 118 U/L (ref 35–144)
BUN/Creatinine Ratio: 11 (calc) (ref 6–22)
BUN: 15 mg/dL (ref 7–25)
CO2: 23 mmol/L (ref 20–32)
Calcium: 9.7 mg/dL (ref 8.6–10.3)
Chloride: 108 mmol/L (ref 98–110)
Creat: 1.38 mg/dL — ABNORMAL HIGH (ref 0.70–1.28)
Globulin: 2.4 g/dL (calc) (ref 1.9–3.7)
Glucose, Bld: 134 mg/dL — ABNORMAL HIGH (ref 65–99)
Potassium: 5 mmol/L (ref 3.5–5.3)
Sodium: 141 mmol/L (ref 135–146)
Total Bilirubin: 0.6 mg/dL (ref 0.2–1.2)
Total Protein: 6.9 g/dL (ref 6.1–8.1)

## 2021-08-30 LAB — CBC WITH DIFFERENTIAL/PLATELET
Absolute Monocytes: 586 cells/uL (ref 200–950)
Basophils Absolute: 92 cells/uL (ref 0–200)
Basophils Relative: 1.5 %
Eosinophils Absolute: 372 cells/uL (ref 15–500)
Eosinophils Relative: 6.1 %
HCT: 35.1 % — ABNORMAL LOW (ref 38.5–50.0)
Hemoglobin: 11.1 g/dL — ABNORMAL LOW (ref 13.2–17.1)
Lymphs Abs: 1659 cells/uL (ref 850–3900)
MCH: 25.1 pg — ABNORMAL LOW (ref 27.0–33.0)
MCHC: 31.6 g/dL — ABNORMAL LOW (ref 32.0–36.0)
MCV: 79.4 fL — ABNORMAL LOW (ref 80.0–100.0)
MPV: 10.7 fL (ref 7.5–12.5)
Monocytes Relative: 9.6 %
Neutro Abs: 3392 cells/uL (ref 1500–7800)
Neutrophils Relative %: 55.6 %
Platelets: 408 10*3/uL — ABNORMAL HIGH (ref 140–400)
RBC: 4.42 10*6/uL (ref 4.20–5.80)
RDW: 14.8 % (ref 11.0–15.0)
Total Lymphocyte: 27.2 %
WBC: 6.1 10*3/uL (ref 3.8–10.8)

## 2021-08-30 LAB — VITAMIN B12: Vitamin B-12: 290 pg/mL (ref 200–1100)

## 2021-08-30 NOTE — Chronic Care Management (AMB) (Signed)
?  Chronic Care Management  ? ?Note ? ?08/30/2021 ?Name: Paul Spears MRN: [id number] DOB: 10/13/1945 ? ?Paul Spears is a 76 y.o. year old male who is a primary care patient of Delsa Grana, [place]. I reached out to [name] by phone today in response to a referral sent by Paul Spears PCP. ? ?Paul Spears was given information about Chronic Care Management services today including:  ?CCM service includes personalized support from designated clinical staff supervised by his physician, including individualized plan of care and coordination with other care providers ?24/7 contact phone numbers for assistance for urgent and routine care needs. ?Service will only be billed when office clinical staff spend 20 minutes or more in a month to coordinate care. ?Only one practitioner may furnish and bill the service in a calendar month. ?The patient may stop CCM services at any time (effective at the end of the month) by phone call to the office staff. ?The patient is responsible for co-pay (up to 20% after annual deductible is met) if co-pay is required by the individual health plan.  ? ?Patient agreed to services and verbal consent obtained.  ? ?Follow up plan: ?Telephone appointment with care management team member scheduled for: 09/07/2021 and 10/10/2021 ? ?Zayonna Ayuso, CCMA ?Care Guide, Embedded Care Coordination ?[place]  Care Management  ?Direct Dial: [phone] ? ? ?

## 2021-09-07 ENCOUNTER — Ambulatory Visit: Payer: Medicare Other | Admitting: *Deleted

## 2021-09-07 DIAGNOSIS — I10 Essential (primary) hypertension: Secondary | ICD-10-CM

## 2021-09-07 DIAGNOSIS — E1121 Type 2 diabetes mellitus with diabetic nephropathy: Secondary | ICD-10-CM

## 2021-09-07 NOTE — Chronic Care Management (AMB) (Signed)
?  Care Management  ? ?Follow Up Note ? ? ?09/07/2021 ?Name: Paul Spears MRN: [id number] DOB: November 24, 1945 ? ? ?Referred by: Delsa Grana, PA-C ?Reason for referral : Chronic Care Management ? ? ?Successful contact was made with the patient to discuss care management and care coordination services. Patient declines engagement at this time.  ? ?Brief assessment completed. Patient states having initial concerns regarding his memory and was requesting medication options from provided. However patient states no interest in this at this time as memory issues are at baseline. Patient states that his sister assists with his care needs, no community resource needs identified at this time. ? ?Follow Up Plan:  Patient has been scheduled  for initial appointment with CCM pharmacist on 10/10/21.   This social worker's contact information provided for follow up if needed in the future. ? ?[employer], LCSW ?Clinical Social Worker  ?[facility] Care Management ?[phone] ? ? ?

## 2021-09-12 DIAGNOSIS — M79632 Pain in left forearm: Secondary | ICD-10-CM | POA: Insufficient documentation

## 2021-09-12 HISTORY — DX: Pain in left forearm: M79.632

## 2021-09-14 ENCOUNTER — Other Ambulatory Visit: Payer: Self-pay | Admitting: Physician Assistant

## 2021-09-17 NOTE — Telephone Encounter (Signed)
Requested Prescriptions  ?Pending Prescriptions Disp Refills  ?? metFORMIN (GLUCOPHAGE) 1000 MG tablet [Pharmacy Med Name: metFORMIN HCl 1000 MG Oral Tablet] 180 tablet 0  ?  Sig: TAKE 1 TABLET BY MOUTH TWICE DAILY WITH A MEAL  ?  ? Endocrinology:  Diabetes - Biguanides Failed - 09/14/2021  8:24 PM  ?  ?  Failed - Cr in normal range and within 360 days  ?  Creat  ?Date Value Ref Range Status  ?08/29/2021 1.38 (H) 0.70 - 1.28 mg/dL Final  ? ?Creatinine, Urine  ?Date Value Ref Range Status  ?09/08/2020 417 (H) 20 - 320 mg/dL Final  ?  Comment:  ?  Verified by repeat analysis. ?. ?  ?   ?  ?  Failed - eGFR in normal range and within 360 days  ?  GFR, Est African American  ?Date Value Ref Range Status  ?01/28/2020 60 > OR = 60 mL/min/1.19m Final  ? ?GFR, Est Non African American  ?Date Value Ref Range Status  ?01/28/2020 51 (L) > OR = 60 mL/min/1.785mFinal  ? ?eGFR  ?Date Value Ref Range Status  ?05/30/2021 56 (L) > OR = 60 mL/min/1.7340minal  ?  Comment:  ?  The eGFR is based on the CKD-EPI 2021 equation. To calculate  ?the new eGFR from a previous Creatinine or Cystatin C ?result, go to https://www.kidney.org/professionals/ ?kdoqi/gfr%5Fcalculator ?  ?   ?  ?  Passed - HBA1C is between 0 and 7.9 and within 180 days  ?  Hgb A1c MFr Bld  ?Date Value Ref Range Status  ?08/29/2021 7.9 (H) <5.7 % of total Hgb Final  ?  Comment:  ?  For someone without known diabetes, a hemoglobin A1c ?value of 6.5% or greater indicates that they may have  ?diabetes and this should be confirmed with a follow-up  ?test. ?. ?For someone with known diabetes, a value <7% indicates  ?that their diabetes is well controlled and a value  ?greater than or equal to 7% indicates suboptimal  ?control. A1c targets should be individualized based on  ?duration of diabetes, age, comorbid conditions, and  ?other considerations. ?. ?Currently, no consensus exists regarding use of ?hemoglobin A1c for diagnosis of diabetes for children. ?. ?  ?   ?  ?   Passed - B12 Level in normal range and within 720 days  ?  Vitamin B-12  ?Date Value Ref Range Status  ?08/29/2021 290 200 - 1,100 pg/mL Final  ?  Comment:  ?  . ?Please Note: Although the reference range for vitamin ?B12 is [phone] pg/mL, it has been reported that between ?5 and 10% of patients with values between 200 and 400 ?pg/mL may experience neuropsychiatric and hematologic ?abnormalities due to occult B12 deficiency; less than 1% ?of patients with values above 400 pg/mL will have symptoms. ?. ?  ?   ?  ?  Passed - Valid encounter within last 6 months  ?  Recent Outpatient Visits   ?      ? 2 weeks ago Controlled type 2 diabetes mellitus with diabetic nephropathy, without long-term current use of insulin ([place], [facility]-C  ? 3 months ago Controlled type 2 diabetes mellitus with diabetic nephropathy, without long-term current use of insulin ([place] EriDani GobbleA-C  ? 6 months ago Controlled type 2 diabetes mellitus with diabetic nephropathy, without long-term current use of insulin ([facility]? [facility]  [facility] Delsa Grana, PA-C  ? 1 year ago Controlled type 2 diabetes mellitus with diabetic nephropathy, without long-term current use of insulin ([place])  ? [facility] [place], [place], [place]  ? 1 year ago Iron deficiency anemia, unspecified iron deficiency anemia type  ? [facility] Delsa Grana, [place]  ?  ?  ?Future Appointments   ?        ? In 2 months Delsa Grana, PA-C [place]  ?  ? ?  ?  ?  Passed - CBC within normal limits and completed in the last 12 months  ?  WBC  ?Date Value Ref Range Status  ?08/29/2021 6.1 3.8 - 10.8 Thousand/uL Final  ? ?RBC  ?Date Value Ref Range Status  ?08/29/2021 4.42 4.20 - 5.80 Million/uL Final  ? ?Hemoglobin  ?Date Value Ref Range Status  ?08/29/2021 11.1 (L) 13.2 - 17.1 g/dL Final  ? ?HCT  ?Date Value Ref Range Status   ?08/29/2021 35.1 (L) 38.5 - 50.0 % Final  ? ?MCHC  ?Date Value Ref Range Status  ?08/29/2021 31.6 (L) 32.0 - 36.0 g/dL Final  ? ?MCH  ?Date Value Ref Range Status  ?08/29/2021 25.1 (L) 27.0 - 33.0 pg Final  ? ?MCV  ?Date Value Ref Range Status  ?08/29/2021 79.4 (L) 80.0 - 100.0 fL Final  ? ?No results found for: PLTCOUNTKUC, LABPLAT, [place] ?RDW  ?Date Value Ref Range Status  ?08/29/2021 14.8 11.0 - 15.0 % Final  ? ?  ?  ?  ? ?

## 2021-10-03 ENCOUNTER — Telehealth: Payer: Self-pay

## 2021-10-03 NOTE — Progress Notes (Signed)
? ? ?Chronic Care Management ?Pharmacy Assistant  ? ?Name: Paul Spears  MRN: [id number] DOB: 02-11-1946 ? ?Initial Visit with [name], CPP on 10/10/2021 @ 0900 ?Initial Visit Assessment ? ?Conditions to be addressed/monitored: ?HTN, HLD, DMII, CKD Stage III, GERD, Osteopenia, Osteoarthritis, and At Risk for depressed moods, Potential for cognitive impairment ? ?Primary concerns for visit include: ?I was not able to connect with the patient.  ? ?Recent office visits:  ?08/29/2021 Talitha Givens,  PA-C (PCP Office Visit) for Follow-up- No medication changes noted, Lab order placed, patient to follow-up in 3 months ? ?05/30/2021 Talitha Givens, PA-C (PCP Office Visit) for Follow-up- No medication changes noted, Lab order placed, Patient to follow-up in 3 months ? ?Recent consult visits:  ?06/25/2021 Lavonia Dana, MD (Nephrology) for Follow-up- No medication changes noted, No orders placed, Patient to follow-up in 6 months ? ?Hospital visits:  ?None in previous 6 months ? ?Medications: ?Outpatient Encounter Medications as of 10/03/2021  ?Medication Sig  ? albuterol (PROAIR HFA) 108 (90 Base) MCG/ACT inhaler Inhale 2 puffs into the lungs every 4 (four) hours as needed for wheezing or shortness of breath.  ? amLODipine (NORVASC) 2.5 MG tablet Take 2.5 mg by mouth daily.  ? atorvastatin (LIPITOR) 80 MG tablet Take 1 tablet (80 mg total) by mouth daily. At bedtime  ? Cholecalciferol (VITAMIN D3 PO) Take 1 capsule by mouth daily.  ? ezetimibe (ZETIA) 10 MG tablet Take 1 tablet (10 mg total) by mouth daily.  ? famotidine (PEPCID) 20 MG tablet Take 1 tablet (20 mg total) by mouth 2 (two) times daily as needed for heartburn or indigestion.  ? FARXIGA 10 MG TABS tablet Take 10 mg by mouth every morning.  ? fluticasone (FLONASE) 50 MCG/ACT nasal spray Place 2 sprays into both nostrils daily as needed.  ? glimepiride (AMARYL) 1 MG tablet Take 1 tablet (1 mg total) by mouth 2 (two) times daily as needed (with meals). Hold if blood  sugars are <100 or if not eating  ? glucose blood (ACCU-CHEK AVIVA PLUS) test strip Use once daily  ? Lancets (ACCU-CHEK MULTICLIX) lancets USE AS DIRECTED  ? levocetirizine (XYZAL) 5 MG tablet TAKE 1 TABLET BY MOUTH ONCE DAILY IN THE EVENING  ? metFORMIN (GLUCOPHAGE) 1000 MG tablet TAKE 1 TABLET BY MOUTH TWICE DAILY WITH A MEAL  ? pantoprazole (PROTONIX) 40 MG tablet Take 1 tablet (40 mg total) by mouth daily.  ? pioglitazone (ACTOS) 15 MG tablet Take 15 mg by mouth daily.  ? ?No facility-administered encounter medications on file as of 10/03/2021.  ? ?Care Gaps: ?COVID-19 Booster 4 ?Diabetic Foot Exam ?Urine Microalbumin ? ?Star Rating Drugs: ?Atorvastatin 80 mg last filled on 08/30/2021 for a 90-Day supply from [place] ?Farxiga 10 mg last filled on 07/22/2021 for a 30-Day supply from [place] ?Glimepiride 1 mg last filled on 09/14/2021 for a 90-Day supply from [place] ?Metformin 1000 mg last filled on 08/08/2021 for a 45-Day supply from [place] ?Pioglitazone 15 mg last filled on 12/25/2020 for a 90-Day supply from [place] ? ?Questions for Clinical Pharmacist:  ? ?1.Are you able to connect with Patient ? ? No the phone just rang for several minutes and then a recording came on asking to enter my access code. I was unable to LVM requesting the patient to return my call ?  ?  ?2.Confirmed appointment date/time with patient/caregiver?  I was unable to confirm  appointment on 10/10/2021 at 0900 with Daron Offer CPP ?   ?  3.Visit type telephone ?  ?  ?04/25 @ 0937 Tried calling patient to complete Initial Assessment. Phone rang multiple times, no answer, no VM unable to leave message ? ?04/25 @ 1252 Tried calling patient to complete Initial Assessment. Phone rang multiple times, no answer, no VM unable to leave message ? ?[name], CPA/CMA ?Clinical Pharmacist Assistant ?Phone: [phone]  ? ?  ?  ?  ?

## 2021-10-10 ENCOUNTER — Telehealth: Payer: Medicare Other

## 2021-10-10 NOTE — Progress Notes (Deleted)
Chronic Care Management Pharmacy Note  10/10/2021 Name:  Paul Spears MRN:  [id number] DOB:  1945-07-07  Summary: ***  Recommendations/Changes made from today's visit: ***  Plan: ***   Subjective: Paul Spears is an 76 y.o. year old male who is a primary patient of Delsa Grana, [place].  The CCM team was consulted for assistance with disease management and care coordination needs.    Engaged with patient by telephone for initial visit in response to provider referral for pharmacy case management and/or care coordination services.   Consent to Services:  The patient was given the following information about Chronic Care Management services today, agreed to services, and gave verbal consent: 1. CCM service includes personalized support from designated clinical staff supervised by the primary care provider, including individualized plan of care and coordination with other care providers 2. 24/7 contact phone numbers for assistance for urgent and routine care needs. 3. Service will only be billed when office clinical staff spend 20 minutes or more in a month to coordinate care. 4. Only one practitioner may furnish and bill the service in a calendar month. 5.The patient may stop CCM services at any time (effective at the end of the month) by phone call to the office staff. 6. The patient will be responsible for cost sharing (co-pay) of up to 20% of the service fee (after annual deductible is met). Patient agreed to services and consent obtained.  Patient Care Team: Delsa Grana, PA-C as PCP - General (Family Medicine) Lavonia Dana, MD as Consulting Physician (Internal Medicine) Lollie Sails, MD (Inactive) as Consulting Physician (Gastroenterology) Germaine Pomfret, [facility] (Pharmacist) Vern Claude, LCSW as Social Worker  Recent office visits: ***  Recent consult visits: [facility] visits: {Hospital DC Yes/No:25215}   Objective:  Lab Results  Component Value  Date   CREATININE 1.38 (H) 08/29/2021   BUN 15 08/29/2021   EGFR 56 (L) 05/30/2021   GFRNONAA 51 (L) 01/28/2020   GFRAA 60 01/28/2020   NA 141 08/29/2021   K 5.0 08/29/2021   CALCIUM 9.7 08/29/2021   CO2 23 08/29/2021   GLUCOSE 134 (H) 08/29/2021    Lab Results  Component Value Date/Time   HGBA1C 7.9 (H) 08/29/2021 09:48 AM   HGBA1C 7.6 (A) 05/30/2021 10:35 AM   HGBA1C 7.9 (H) 03/02/2021 11:50 AM   MICROALBUR 38.4 09/08/2020 11:56 AM   MICROALBUR 20 06/29/2019 09:01 AM   MICROALBUR 1.7 08/21/2018 10:45 AM   MICROALBUR 20 09/25/2015 09:09 AM    Last diabetic Eye exam:  Lab Results  Component Value Date/Time   HMDIABEYEEXA No Retinopathy 10/19/2020 12:00 AM    Last diabetic Foot exam: No results found for: HMDIABFOOTEX   Lab Results  Component Value Date   CHOL 104 05/30/2021   HDL 37 (L) 05/30/2021   LDLCALC 50 05/30/2021   TRIG 86 05/30/2021   CHOLHDL 2.8 05/30/2021       Latest Ref Rng & Units 08/29/2021    9:48 AM 05/30/2021   11:39 AM 01/28/2020    8:20 AM  Hepatic Function  Total Protein 6.1 - 8.1 g/dL 6.9   7.0   6.1    AST 10 - 35 U/L 16   16   16     ALT 9 - 46 U/L 10   14   15     Total Bilirubin 0.2 - 1.2 mg/dL 0.6   0.7   0.5      Lab Results  Component Value  Date/Time   TSH 1.34 06/30/2019 08:05 AM   TSH 1.33 03/26/2019 12:00 AM       Latest Ref Rng & Units 08/29/2021    9:48 AM 05/30/2021   11:39 AM 03/14/2020   11:13 AM  CBC  WBC 3.8 - 10.8 Thousand/uL 6.1   6.6   5.3    Hemoglobin 13.2 - 17.1 g/dL 11.1   11.7   12.1    Hematocrit 38.5 - 50.0 % 35.1   36.0   37.2    Platelets 140 - 400 Thousand/uL 408   412   353      Lab Results  Component Value Date/Time   VD25OH 47 02/25/2019 12:00 AM   VD25OH 20 (L) 10/28/2017 10:38 AM    Clinical ASCVD: {YES/NO:21197} The ASCVD Risk score (Arnett DK, et al., 2019) failed to calculate for the following reasons:   The valid total cholesterol range is 130 to 320 mg/dL       08/29/2021    8:48 AM  05/30/2021   10:22 AM 03/02/2021   11:20 AM  Depression screen PHQ 2/9  Decreased Interest 1 0 2  Down, Depressed, Hopeless 0 0 3  PHQ - 2 Score 1 0 5  Altered sleeping 0 0   Tired, decreased energy 0 0   Change in appetite 0 0   Feeling bad or failure about yourself  0 0   Trouble concentrating 0 0   Moving slowly or fidgety/restless 0 0   Suicidal thoughts 0 0   PHQ-9 Score 1 0   Difficult doing work/chores Not difficult at all Not difficult at all      ***Other: (CHADS2VASc if Afib, MMRC or CAT for COPD, ACT, DEXA)  Social History   Tobacco Use  Smoking Status Never  Smokeless Tobacco Never   BP Readings from Last 3 Encounters:  08/29/21 132/74  05/30/21 130/72  03/02/21 128/70   Pulse Readings from Last 3 Encounters:  08/29/21 93  05/30/21 98  03/02/21 93   Wt Readings from Last 3 Encounters:  08/29/21 197 lb 4.8 oz (89.5 kg)  05/30/21 200 lb 4.8 oz (90.9 kg)  03/02/21 210 lb 3.2 oz (95.3 kg)   BMI Readings from Last 3 Encounters:  08/29/21 27.52 kg/m  05/30/21 27.94 kg/m  03/02/21 28.51 kg/m    Assessment/Interventions: Review of patient past medical history, allergies, medications, health status, including review of consultants reports, laboratory and other test data, was performed as part of comprehensive evaluation and provision of chronic care management services.   SDOH:  (Social Determinants of Health) assessments and interventions performed: Yes  SDOH Screenings   Alcohol Screen: Not on file  Depression (PHQ2-9): Low Risk    PHQ-2 Score: 1  Financial Resource Strain: Not on file  Food Insecurity: Not on file  Housing: Not on file  Physical Activity: Not on file  Social Connections: Not on file  Stress: Not on file  Tobacco Use: Low Risk    Smoking Tobacco Use: Never   Smokeless Tobacco Use: Never   Passive Exposure: Not on file  Transportation Needs: Not on file    CCM Care Plan  Allergies  Allergen Reactions   Losartan Potassium  Other (See Comments)    hyperkalemia    Medications Reviewed Today     Reviewed by Salomon Fick, CMA (Certified Medical Assistant) on 08/29/21 at [place] List Status: <None>   Medication Order Taking? Sig Documenting Provider Last Dose Status Informant  albuterol (PROAIR  HFA) 108 (90 Base) MCG/ACT inhaler [id number] Yes Inhale 2 puffs into the lungs every 4 (four) hours as needed for wheezing or shortness of breath. Delsa Grana, PA-C Taking Active   amLODipine (NORVASC) 2.5 MG tablet [id number] Yes Take 2.5 mg by mouth daily. [provider] Taking Active   atorvastatin (LIPITOR) 80 MG tablet [id number] Yes Take 1 tablet (80 mg total) by mouth daily. At bedtime Delsa Grana, PA-C Taking Active   Cholecalciferol (VITAMIN D3 PO) [id number] Yes Take 1 capsule by mouth daily. [provider] Taking Active Self  ezetimibe (ZETIA) 10 MG tablet [id number] Yes Take 1 tablet (10 mg total) by mouth daily. Delsa Grana, PA-C Taking Active   famotidine (PEPCID) 20 MG tablet [id number] Yes Take 1 tablet (20 mg total) by mouth 2 (two) times daily as needed for heartburn or indigestion. Delsa Grana, PA-C Taking Active   FARXIGA 10 MG TABS tablet [id number] Yes Take 10 mg by mouth every morning. [provider] Taking Active   fluticasone (FLONASE) 50 MCG/ACT nasal spray [id number] Yes Place 2 sprays into both nostrils daily as needed. Delsa Grana, PA-C Taking Active   glimepiride (AMARYL) 1 MG tablet [id number] Yes Take 1 tablet (1 mg total) by mouth 2 (two) times daily as needed (with meals). Hold if blood sugars are <100 or if not eating Delsa Grana, PA-C Taking Active   glucose blood (ACCU-CHEK AVIVA PLUS) test strip [id number] Yes Use once daily Delsa Grana, PA-C Taking Active   Lancets (ACCU-CHEK MULTICLIX) lancets [id number] Yes USE AS DIRECTED Hubbard Hartshorn, FNP Taking Active   levocetirizine (XYZAL) 5 MG tablet [id number] Yes TAKE 1 TABLET BY MOUTH ONCE DAILY IN THE  EVENING Mecum, Erin E, PA-C Taking Active   metFORMIN (GLUCOPHAGE) 1000 MG tablet [id number] Yes TAKE 1 TABLET BY MOUTH TWICE DAILY WITH A MEAL Mecum, Erin E, PA-C Taking Active   pantoprazole (PROTONIX) 40 MG tablet [id number] Yes Take 1 tablet (40 mg total) by mouth daily. Delsa Grana, PA-C Taking Active   pioglitazone (ACTOS) 15 MG tablet [id number] Yes Take 15 mg by mouth daily. [provider] Taking Active             Patient Active Problem List   Diagnosis Date Noted   Potential for cognitive impairment 05/30/2021   At risk for depressed mood 05/30/2021   Iron deficiency anemia 11/11/2017   DDD (degenerative disc disease), cervical 12/07/2015   GERD with esophagitis 09/21/2014   Barrett esophagus 09/21/2014   Cervical osteoarthritis 09/21/2014   Chronic kidney disease (CKD), stage III (moderate) ([place]) 09/21/2014   Controlled type 2 diabetes mellitus with diabetic nephropathy ([place]) 09/21/2014   Microalbuminuria 09/21/2014   Osteopenia 09/21/2014   HTN (hypertension) 05/20/2014   Hyperlipidemia 05/20/2014   Benign hypertensive kidney disease with chronic kidney disease 05/20/2014    Immunization History  Administered Date(s) Administered   Fluad Quad(high Dose 65+) 02/25/2019, 03/14/2020, 03/02/2021   Influenza, High Dose Seasonal PF 03/06/2018   Influenza,inj,Quad PF,6+ Mos 02/18/2013   Influenza-Unspecified 02/18/2013, 03/18/2015, 04/01/2017   PFIZER(Purple Top)SARS-COV-2 Vaccination 08/12/2019, 09/07/2019, 03/28/2020   Pneumococcal Conjugate-13 04/08/2016, 03/06/2018   Pneumococcal Polysaccharide-23 08/03/2012   Tdap 04/03/2016    Conditions to be addressed/monitored:  Hypertension, Hyperlipidemia, Diabetes, GERD, Chronic Kidney Disease, Osteopenia, and Osteoarthritis  There are no care plans that you recently modified to display for this patient.    Medication Assistance: {MEDASSISTANCEINFO:25044}  Compliance/Adherence/Medication fill history: Care  Gaps: ***  Star-Rating Drugs: ***  Patient's preferred pharmacy is:  [place], [place] - [address] [place] [address] Phone: [phone] Fax: [phone]  Uses pill box? {Yes or If no, why not?:20788} Pt endorses ***% compliance  We discussed: {Pharmacy options:24294} Patient decided to: {US Pharmacy Plan:23885}  Care Plan and Follow Up Patient Decision:  {FOLLOWUP:24991}  Plan: {CM FOLLOW UP QMGN:00370}  ***  Current Barriers:  {pharmacybarriers:24917}  Pharmacist Clinical Goal(s):  Patient will {PHARMACYGOALCHOICES:24921} through collaboration with PharmD and provider.   Interventions: 1:1 collaboration with Delsa Grana, PA-C regarding development and update of comprehensive plan of care as evidenced by provider attestation and co-signature Inter-disciplinary care team collaboration (see longitudinal plan of care) Comprehensive medication review performed; medication list updated in electronic medical record  Hypertension (BP goal <130/80) -Controlled -Current treatment: Amlodipine 2.5 mg daily  -Medications previously tried: Losartan (Hyperkalemia), HCTZ,  -Current home readings: *** -Current dietary habits: *** -Current exercise habits: *** -{ACTIONS;DENIES/REPORTS:21021675::"Denies"} hypotensive/hypertensive symptoms -Educated on {CCM BP Counseling:25124} -Counseled to monitor BP at home ***, document, and provide log at future appointments -{CCMPHARMDINTERVENTION:25122}  Hyperlipidemia: (LDL goal < 70) -Controlled -Current treatment: Atorvastatin 80 mg daily  Ezetimibe 10 mg daily  -Medications previously tried: ***  -Current dietary patterns: *** -Current exercise habits: *** -Educated on {CCM HLD Counseling:25126} -{CCMPHARMDINTERVENTION:25122}  Diabetes (A1c goal <8%) -Controlled -Current medications: Farxiga 10 mg daily  Glimepiride 1 mg twice daily as needed  Metformin 1000 mg twice daily   Pioglitazone 15 mg daily  -Medications previously tried: Trulicity, Januvia  -Current home glucose readings fasting glucose: *** post prandial glucose: *** -{ACTIONS;DENIES/REPORTS:21021675::"Denies"} hypoglycemic/hyperglycemic symptoms -Current meal patterns:  breakfast: ***  lunch: ***  dinner: *** snacks: *** drinks: *** -Current exercise: *** -Educated on {CCM DM COUNSELING:25123} -Counseled to check feet daily and get yearly eye exams -{CCMPHARMDINTERVENTION:25122}  Osteoporosis / Osteopenia (Goal ***) -{US controlled/uncontrolled:25276} -Last DEXA Scan: ***   T-Score femoral neck: ***  T-Score total hip: ***  T-Score lumbar spine: ***  T-Score forearm radius: ***  10-year probability of major osteoporotic fracture: ***  10-year probability of hip fracture: *** -Patient {is;is not an osteoporosis candidate:23886} -Current treatment  Vitamin D3  -Medications previously tried: ***  -{Osteoporosis Counseling:23892} -{CCMPHARMDINTERVENTION:25122}  *** (Goal: ***) -{US controlled/uncontrolled:25276} -Current treatment  Famotidine 20 mg twice daily as needed  Pantoprazole 40 mg daily  -Medications previously tried: ***  -{CCMPHARMDINTERVENTION:25122}  Chronic Kidney Disease Stage 3a  -All medications assessed for renal dosing and appropriateness in chronic kidney disease. -{CCMPHARMDINTERVENTION:25122}   Patient Goals/Self-Care Activities Patient will:  - {pharmacypatientgoals:24919}  Follow Up Plan: {CM FOLLOW UP WUGQ:91694}

## 2021-10-16 ENCOUNTER — Other Ambulatory Visit: Payer: Self-pay | Admitting: Physician Assistant

## 2021-10-16 DIAGNOSIS — Z9109 Other allergy status, other than to drugs and biological substances: Secondary | ICD-10-CM

## 2021-10-16 NOTE — Telephone Encounter (Signed)
Requested Prescriptions  ?Pending Prescriptions Disp Refills  ?? levocetirizine (XYZAL) 5 MG tablet [Pharmacy Med Name: Levocetirizine Dihydrochloride 5 MG Oral Tablet] 90 tablet 0  ?  Sig: TAKE 1 TABLET BY MOUTH ONCE DAILY IN THE EVENING  ?  ? Ear, Nose, and Throat:  Antihistamines - levocetirizine dihydrochloride Failed - 10/16/2021  8:51 AM  ?  ?  Failed - Cr in normal range and within 360 days  ?  Creat  ?Date Value Ref Range Status  ?08/29/2021 1.38 (H) 0.70 - 1.28 mg/dL Final  ? ?Creatinine, Urine  ?Date Value Ref Range Status  ?09/08/2020 417 (H) 20 - 320 mg/dL Final  ?  Comment:  ?  Verified by repeat analysis. ?. ?  ?   ?  ?  Passed - eGFR is 10 or above and within 360 days  ?  GFR, Est African American  ?Date Value Ref Range Status  ?01/28/2020 60 > OR = 60 mL/min/1.52m Final  ? ?GFR, Est Non African American  ?Date Value Ref Range Status  ?01/28/2020 51 (L) > OR = 60 mL/min/1.719mFinal  ? ?eGFR  ?Date Value Ref Range Status  ?05/30/2021 56 (L) > OR = 60 mL/min/1.736minal  ?  Comment:  ?  The eGFR is based on the CKD-EPI 2021 equation. To calculate  ?the new eGFR from a previous Creatinine or Cystatin C ?result, go to https://www.kidney.org/professionals/ ?kdoqi/gfr%5Fcalculator ?  ?   ?  ?  Passed - Valid encounter within last 12 months  ?  Recent Outpatient Visits   ?      ? 1 month ago Controlled type 2 diabetes mellitus with diabetic nephropathy, without long-term current use of insulin ([place]CC[place]? [facility] Erin E, PA-C  ? 4 months ago Controlled type 2 diabetes mellitus with diabetic nephropathy, without long-term current use of insulin ([place]CC[place]? [facility], EriDani GobbleA-C  ? 7 months ago Controlled type 2 diabetes mellitus with diabetic nephropathy, without long-term current use of insulin ([place]CCSam[place]? [facility]  ? 1 year ago Controlled type 2 diabetes mellitus with diabetic nephropathy, without long-term  current use of insulin (HCCWilliams? [facility] 1 year ago Iron deficiency anemia, unspecified iron deficiency anemia type  ? [facility]anaA-[place]  ?  ?Future Appointments   ?        ? In 1 month TapDelsa GranaA-C [facility]  ? ?  ?  ?  ? ?

## 2021-11-20 ENCOUNTER — Telehealth: Payer: Self-pay

## 2021-11-20 NOTE — Progress Notes (Signed)
    Chronic Care Management Pharmacy Assistant   Name: Paul Spears  MRN: [id number] DOB: [birth year]  Patient called to remind of his Initial telephone appointment with [name], CPP on 11/21/2021 '@1300'$   No answer, and I was unable to leave a message as I tried calling the patient three times, but all I received was a busy signal.  Star Rating Drug: Metformin 1000 mg last filled on 09/19/2021 for a 90-Day supply with [place] 10 mg last filled on 11/20/2021 for a 30-Day supply with [place] Glimepiride 1 mg last fille don 09/16/2021 for a 90-Day supply with [place] Atorvastatin 80 mg last filled on 08/30/2021 for a 90-Day supply with [name] 15 mg last filled on 12/25/2020 for a 90-Day supply with [place]  Any gaps in medications fill history? Yes  Care Gaps: [id number] Booster 4 Diabetic Foot Exam Urine Microalbumin Diabetic Eye Exam   [name], CPA/CMA Clinical Pharmacist Assistant Phone: [phone]

## 2021-11-21 ENCOUNTER — Telehealth: Payer: Medicare Other

## 2021-11-21 NOTE — Progress Notes (Incomplete)
Chronic Care Management Pharmacy Note  11/21/2021 Name:  Paul Spears MRN:  [id number] DOB:  Dec 24, 1945  Summary: ***  Recommendations/Changes made from today's visit: ***  Plan: ***   Subjective: KEYSHAWN Spears is an 76 y.o. year old male who is a primary patient of Delsa Grana, [place].  The CCM team was consulted for assistance with disease management and care coordination needs.    Engaged with patient by telephone for initial visit in response to provider referral for pharmacy case management and/or care coordination services.   Consent to Services:  The patient was given the following information about Chronic Care Management services today, agreed to services, and gave verbal consent: 1. CCM service includes personalized support from designated clinical staff supervised by the primary care provider, including individualized plan of care and coordination with other care providers 2. 24/7 contact phone numbers for assistance for urgent and routine care needs. 3. Service will only be billed when office clinical staff spend 20 minutes or more in a month to coordinate care. 4. Only one practitioner may furnish and bill the service in a calendar month. 5.The patient may stop CCM services at any time (effective at the end of the month) by phone call to the office staff. 6. The patient will be responsible for cost sharing (co-pay) of up to 20% of the service fee (after annual deductible is met). Patient agreed to services and consent obtained.  Patient Care Team: Delsa Grana, PA-C as PCP - General (Family Medicine) Lavonia Dana, MD as Consulting Physician (Internal Medicine) Lollie Sails, MD (Inactive) as Consulting Physician (Gastroenterology) Germaine Pomfret, [facility] (Pharmacist) Vern Claude, [place] as Social Worker  Recent office visits: 08/29/2021 Talitha Givens,  PA-C (PCP Office Visit) for Follow-up- No medication changes noted, Lab order placed, patient to follow-up in  3 months   05/30/2021 Talitha Givens, PA-C (PCP Office Visit) for Follow-up- No medication changes noted, Lab order placed, Patient to follow-up in 3 months  Recent consult visits: 06/25/2021 Lavonia Dana, MD (Nephrology) for Follow-up- No medication changes noted, No orders placed, Patient to follow-up in 6 months  Hospital visits: None in previous 6 months   Objective:  Lab Results  Component Value Date   CREATININE 1.38 (H) 08/29/2021   BUN 15 08/29/2021   EGFR 56 (L) 05/30/2021   GFRNONAA 51 (L) 01/28/2020   GFRAA 60 01/28/2020   NA 141 08/29/2021   K 5.0 08/29/2021   CALCIUM 9.7 08/29/2021   CO2 23 08/29/2021   GLUCOSE 134 (H) 08/29/2021    Lab Results  Component Value Date/Time   HGBA1C 7.9 (H) 08/29/2021 09:48 AM   HGBA1C 7.6 (A) 05/30/2021 10:35 AM   HGBA1C 7.9 (H) 03/02/2021 11:50 AM   MICROALBUR 38.4 09/08/2020 11:56 AM   MICROALBUR 20 06/29/2019 09:01 AM   MICROALBUR 1.7 08/21/2018 10:45 AM   MICROALBUR 20 09/25/2015 09:09 AM    Last diabetic Eye exam:  Lab Results  Component Value Date/Time   HMDIABEYEEXA No Retinopathy 10/19/2020 12:00 AM    Last diabetic Foot exam: No results found for: HMDIABFOOTEX   Lab Results  Component Value Date   CHOL 104 05/30/2021   HDL 37 (L) 05/30/2021   LDLCALC 50 05/30/2021   TRIG 86 05/30/2021   CHOLHDL 2.8 05/30/2021       Latest Ref Rng & Units 08/29/2021    9:48 AM 05/30/2021   11:39 AM 01/28/2020    8:20 AM  Hepatic Function  Total Protein  6.1 - 8.1 g/dL 6.9   7.0   6.1    AST 10 - 35 U/L _0 ALT 9 - 46 U/L _1 Total Bilirubin 0.2 - 1.2 mg/dL 0.6   0.7   0.5      Lab Results  Component Value Date/Time   TSH 1.34 06/30/2019 08:05 AM   TSH 1.33 03/26/2019 12:00 AM       Latest Ref Rng & Units 08/29/2021    9:48 AM 05/30/2021   11:39 AM 03/14/2020   11:13 AM  CBC  WBC 3.8 - 10.8 Thousand/uL 6.1   6.6   5.3    Hemoglobin 13.2 - 17.1 g/dL 11.1   11.7   12.1    Hematocrit 38.5 -  50.0 % 35.1   36.0   37.2    Platelets 140 - 400 Thousand/uL 408   412   353      Lab Results  Component Value Date/Time   VD25OH 47 02/25/2019 12:00 AM   VD25OH 20 (L) 10/28/2017 10:38 AM    Clinical ASCVD: {YES/NO:21197} The ASCVD Risk score (Arnett DK, et al., 2019) failed to calculate for the following reasons:   The valid total cholesterol range is 130 to 320 mg/dL       08/29/2021    8:48 AM 05/30/2021   10:22 AM 03/02/2021   11:20 AM  Depression screen PHQ 2/9  Decreased Interest 1 0 2  Down, Depressed, Hopeless 0 0 3  PHQ - 2 Score 1 0 5  Altered sleeping 0 0   Tired, decreased energy 0 0   Change in appetite 0 0   Feeling bad or failure about yourself  0 0   Trouble concentrating 0 0   Moving slowly or fidgety/restless 0 0   Suicidal thoughts 0 0   PHQ-9 Score 1 0   Difficult doing work/chores Not difficult at all Not difficult at all      ***Other: (CHADS2VASc if Afib, MMRC or CAT for COPD, ACT, DEXA)  Social History   Tobacco Use  Smoking Status Never  Smokeless Tobacco Never   BP Readings from Last 3 Encounters:  08/29/21 132/74  05/30/21 130/72  03/02/21 128/70   Pulse Readings from Last 3 Encounters:  08/29/21 93  05/30/21 98  03/02/21 93   Wt Readings from Last 3 Encounters:  08/29/21 197 lb 4.8 oz (89.5 kg)  05/30/21 200 lb 4.8 oz (90.9 kg)  03/02/21 210 lb 3.2 oz (95.3 kg)   BMI Readings from Last 3 Encounters:  08/29/21 27.52 kg/m  05/30/21 27.94 kg/m  03/02/21 28.51 kg/m    Assessment/Interventions: Review of patient past medical history, allergies, medications, health status, including review of consultants reports, laboratory and other test data, was performed as part of comprehensive evaluation and provision of chronic care management services.   SDOH:  (Social Determinants of Health) assessments and interventions performed: {yes/no:20286}  SDOH Screenings   Alcohol Screen: Not on file  Depression (PHQ2-9): Low Risk     PHQ-2 Score: 1  Financial Resource Strain: Not on file  Food Insecurity: Not on file  Housing: Not on file  Physical Activity: Not on file  Social Connections: Not on file  Stress: Not on file  Tobacco Use: Low Risk    Smoking Tobacco Use: Never   Smokeless Tobacco Use: Never   Passive Exposure: Not on file  Transportation Needs:  Not on file    CCM Care Plan  Allergies  Allergen Reactions   Losartan Potassium Other (See Comments)    hyperkalemia    Medications Reviewed Today     Reviewed by Salomon Fick, CMA (Certified Medical Assistant) on 08/29/21 at [facility] Status: <None>   Medication Order Taking? Sig Documenting Provider Last Dose Status Informant  albuterol (PROAIR HFA) 108 (90 Base) MCG/ACT inhaler [id number] Yes Inhale 2 puffs into the lungs every 4 (four) hours as needed for wheezing or shortness of breath. Delsa Grana, PA-C Taking Active   amLODipine (NORVASC) 2.5 MG tablet [id number] Yes Take 2.5 mg by mouth daily. [provider] Taking Active   atorvastatin (LIPITOR) 80 MG tablet [id number] Yes Take 1 tablet (80 mg total) by mouth daily. At bedtime Delsa Grana, PA-C Taking Active   Cholecalciferol (VITAMIN D3 PO) [id number] Yes Take 1 capsule by mouth daily. [provider] Taking Active Self  ezetimibe (ZETIA) 10 MG tablet [id number] Yes Take 1 tablet (10 mg total) by mouth daily. Delsa Grana, PA-C Taking Active   famotidine (PEPCID) 20 MG tablet [id number] Yes Take 1 tablet (20 mg total) by mouth 2 (two) times daily as needed for heartburn or indigestion. Delsa Grana, PA-C Taking Active   FARXIGA 10 MG TABS tablet [id number] Yes Take 10 mg by mouth every morning. [provider] Taking Active   fluticasone (FLONASE) 50 MCG/ACT nasal spray [id number] Yes Place 2 sprays into both nostrils daily as needed. Delsa Grana, PA-C Taking Active   glimepiride (AMARYL) 1 MG tablet [id number] Yes Take 1 tablet (1 mg total) by mouth 2  (two) times daily as needed (with meals). Hold if blood sugars are <100 or if not eating Delsa Grana, PA-C Taking Active   glucose blood (ACCU-CHEK AVIVA PLUS) test strip [id number] Yes Use once daily Delsa Grana, PA-C Taking Active   Lancets (ACCU-CHEK MULTICLIX) lancets [id number] Yes USE AS DIRECTED Hubbard Hartshorn, FNP Taking Active   levocetirizine (XYZAL) 5 MG tablet [id number] Yes TAKE 1 TABLET BY MOUTH ONCE DAILY IN THE EVENING Mecum, Erin E, PA-C Taking Active   metFORMIN (GLUCOPHAGE) 1000 MG tablet [id number] Yes TAKE 1 TABLET BY MOUTH TWICE DAILY WITH A MEAL Mecum, Erin E, PA-C Taking Active   pantoprazole (PROTONIX) 40 MG tablet [id number] Yes Take 1 tablet (40 mg total) by mouth daily. Delsa Grana, PA-C Taking Active   pioglitazone (ACTOS) 15 MG tablet [id number] Yes Take 15 mg by mouth daily. [provider] Taking Active             Patient Active Problem List   Diagnosis Date Noted   Potential for cognitive impairment 05/30/2021   At risk for depressed mood 05/30/2021   Iron deficiency anemia 11/11/2017   DDD (degenerative disc disease), cervical 12/07/2015   GERD with esophagitis 09/21/2014   Barrett esophagus 09/21/2014   Cervical osteoarthritis 09/21/2014   Chronic kidney disease (CKD), stage III (moderate) ([place]) 09/21/2014   Controlled type 2 diabetes mellitus with diabetic nephropathy ([place]) 09/21/2014   Microalbuminuria 09/21/2014   Osteopenia 09/21/2014   HTN (hypertension) 05/20/2014   Hyperlipidemia 05/20/2014   Benign hypertensive kidney disease with chronic kidney disease 05/20/2014    Immunization History  Administered Date(s) Administered   Fluad Quad(high Dose 65+) 02/25/2019, 03/14/2020, 03/02/2021   Influenza, High Dose Seasonal PF 03/06/2018   Influenza,inj,Quad PF,6+ Mos 02/18/2013   Influenza-Unspecified 02/18/2013, 03/18/2015, 04/01/2017   PFIZER(Purple Top)SARS-COV-2 Vaccination 08/12/2019, 09/07/2019, 03/28/2020   Pneumococcal  Conjugate-13  04/08/2016, 03/06/2018   Pneumococcal Polysaccharide-23 08/03/2012   Tdap 04/03/2016    Conditions to be addressed/monitored:  Hypertension, Hyperlipidemia, Diabetes, GERD, Chronic Kidney Disease, Osteopenia, and Osteoarthritis  There are no care plans that you recently modified to display for this patient.    Medication Assistance: {MEDASSISTANCEINFO:25044}  Compliance/Adherence/Medication fill history: Care Gaps: ***  Star-Rating Drugs: ***  Patient's preferred pharmacy is:  [facility] [address], [place] - [place] [address] Phone: [phone] Fax: [phone]  Uses pill box? {Yes or If no, why not?:20788} Pt endorses ***% compliance  We discussed: {Pharmacy options:24294} Patient decided to: {US Pharmacy Plan:23885}  Care Plan and Follow Up Patient Decision:  {FOLLOWUP:24991}  Plan: {CM FOLLOW UP [facility]:62376}  ***  Current Barriers:  {pharmacybarriers:24917}  Pharmacist Clinical Goal(s):  Patient will {PHARMACYGOALCHOICES:24921} through collaboration with PharmD and provider.   Interventions: 1:1 collaboration with Delsa Grana, PA-C regarding development and update of comprehensive plan of care as evidenced by provider attestation and co-signature Inter-disciplinary care team collaboration (see longitudinal plan of care) Comprehensive medication review performed; medication list updated in electronic medical record  Hypertension (BP goal {CHL HP UPSTREAM Pharmacist BP [phone]}) -{US controlled/uncontrolled:25276} -Current treatment: Amlodipine 2.5 mg daily -Medications previously tried: ***  -Current home readings: *** -Current dietary habits: *** -Current exercise habits: *** -{ACTIONS;DENIES/REPORTS:21021675::"Denies"} hypotensive/hypertensive symptoms -Educated on {CCM BP Counseling:25124} -Counseled to monitor BP at home ***, document, and provide log at future  appointments -{CCMPHARMDINTERVENTION:25122}  Hyperlipidemia: (LDL goal < ***) -{US controlled/uncontrolled:25276} -Current treatment: Atorvastatin 80 mg daily  Ezetimibe 10 mg daily  -Medications previously tried: ***  -Current dietary patterns: *** -Current exercise habits: *** -Educated on {CCM HLD Counseling:25126} -{CCMPHARMDINTERVENTION:25122}  Diabetes (A1c goal {A1c goals:23924}) -{US controlled/uncontrolled:25276} -Current medications: Farxiga 10 mg daily  Glimepiride 1 mg twice daily  Metformin 1000 mg twice daily  Pioglitazone 15 mg daily  -Medications previously tried: ***  -Current home glucose readings fasting glucose: *** post prandial glucose: *** -{ACTIONS;DENIES/REPORTS:21021675::"Denies"} hypoglycemic/hyperglycemic symptoms -Current meal patterns:  breakfast: ***  lunch: ***  dinner: *** snacks: *** drinks: *** -Current exercise: *** -Educated on {CCM DM COUNSELING:25123} -Counseled to check feet daily and get yearly eye exams -{CCMPHARMDINTERVENTION:25122}  Osteoporosis / Osteopenia (Goal ***) -{US controlled/uncontrolled:25276} -Last DEXA Scan: ***   T-Score femoral neck: ***  T-Score total hip: ***  T-Score lumbar spine: ***  T-Score forearm radius: ***  10-year probability of major osteoporotic fracture: ***  10-year probability of hip fracture: *** -Patient {is;is not an osteoporosis candidate:23886} -Current treatment  Vitamin D3  -Medications previously tried: ***  -{Osteoporosis Counseling:23892} -{CCMPHARMDINTERVENTION:25122}  *** (Goal: ***) -{US controlled/uncontrolled:25276} -Current treatment  Famotidine 20 mg twice daily as needed  Pantoprazole 40 mg daily  -Medications previously tried: ***  -{CCMPHARMDINTERVENTION:25122}  Patient Goals/Self-Care Activities Patient will:  - {pharmacypatientgoals:24919}  Follow Up Plan: {CM FOLLOW UP EGBT:51761}

## 2021-12-04 ENCOUNTER — Encounter: Payer: Self-pay | Admitting: Family Medicine

## 2021-12-04 ENCOUNTER — Ambulatory Visit (INDEPENDENT_AMBULATORY_CARE_PROVIDER_SITE_OTHER): Payer: Medicare Other | Admitting: Family Medicine

## 2021-12-04 VITALS — BP 138/82 | HR 94 | Temp 97.6°F | Resp 18 | Ht 71.0 in | Wt 190.8 lb

## 2021-12-04 DIAGNOSIS — D631 Anemia in chronic kidney disease: Secondary | ICD-10-CM

## 2021-12-04 DIAGNOSIS — K22719 Barrett's esophagus with dysplasia, unspecified: Secondary | ICD-10-CM

## 2021-12-04 DIAGNOSIS — F321 Major depressive disorder, single episode, moderate: Secondary | ICD-10-CM

## 2021-12-04 DIAGNOSIS — I10 Essential (primary) hypertension: Secondary | ICD-10-CM

## 2021-12-04 DIAGNOSIS — E785 Hyperlipidemia, unspecified: Secondary | ICD-10-CM | POA: Diagnosis not present

## 2021-12-04 DIAGNOSIS — D473 Essential (hemorrhagic) thrombocythemia: Secondary | ICD-10-CM

## 2021-12-04 DIAGNOSIS — E1121 Type 2 diabetes mellitus with diabetic nephropathy: Secondary | ICD-10-CM | POA: Diagnosis not present

## 2021-12-04 DIAGNOSIS — Z9189 Other specified personal risk factors, not elsewhere classified: Secondary | ICD-10-CM

## 2021-12-04 DIAGNOSIS — K2100 Gastro-esophageal reflux disease with esophagitis, without bleeding: Secondary | ICD-10-CM

## 2021-12-04 DIAGNOSIS — N1831 Chronic kidney disease, stage 3a: Secondary | ICD-10-CM

## 2021-12-04 DIAGNOSIS — N2581 Secondary hyperparathyroidism of renal origin: Secondary | ICD-10-CM

## 2021-12-04 DIAGNOSIS — J45909 Unspecified asthma, uncomplicated: Secondary | ICD-10-CM

## 2021-12-04 NOTE — Assessment & Plan Note (Signed)
Anemia secondary to chronic diseases, CKD, monitoring

## 2021-12-04 NOTE — Assessment & Plan Note (Signed)
Continue meds per GI

## 2021-12-04 NOTE — Assessment & Plan Note (Signed)
Chronic, not currently on meds    12/04/2021    8:14 AM 08/29/2021    8:48 AM 05/30/2021   10:22 AM  Depression screen PHQ 2/9  Decreased Interest 0 1 0  Down, Depressed, Hopeless 0 0 0  PHQ - 2 Score 0 1 0  Altered sleeping 0 0 0  Tired, decreased energy 0 0 0  Change in appetite 0 0 0  Feeling bad or failure about yourself  0 0 0  Trouble concentrating 0 0 0  Moving slowly or fidgety/restless 0 0 0  Suicidal thoughts 0 0 0  PHQ-9 Score 0 1 0  Difficult doing work/chores Not difficult at all Not difficult at all Not difficult at all   phq reviewed and negative

## 2021-12-04 NOTE — Progress Notes (Signed)
Name: Paul Spears   MRN: [id number]    DOB: 12/27/1945   Date:12/04/2021       Progress Note  Chief Complaint  Patient presents with   Follow-up   Diabetes   Hypertension    Pt states has not taken BP pill yet due to having appointment today.     Subjective:   Paul Spears is a 76 y.o. male, presents to clinic for routine f/up  DM:   Goal for age ~ 7.5  On farxiga, glimepiride and metformin (actos?) Blood sugars 100-120's Denies: Polyuria, polydipsia, vision changes, neuropathy, hypoglycemia Recent pertinent labs: Lab Results  Component Value Date   HGBA1C 7.9 (H) 08/29/2021   HGBA1C 7.6 (A) 05/30/2021   HGBA1C 7.9 (H) 03/02/2021   Lab Results  Component Value Date   MICROALBUR 38.4 09/08/2020   LDLCALC 50 05/30/2021   CREATININE 1.38 (H) 08/29/2021   Standard of care and health maintenance: Foot exam:  done today DM eye exam:  due ACEI/ARB:  not on ace/arb Statin:  lipitor 80   HLD -  Lab Results  Component Value Date   CHOL 104 05/30/2021   HDL 37 (L) 05/30/2021   LDLCALC 50 05/30/2021   TRIG 86 05/30/2021   CHOLHDL 2.8 05/30/2021  Lipids done recently, no med changes, he tolerates Lipitor 80 mg and endorses good compliance he also takes Zetia  Hypertension:  Currently managed on Norvasc 2.5 per nephrology BP Readings from Last 3 Encounters:  12/04/21 138/82  08/29/21 132/74  05/30/21 130/72   Pt denies CP, SOB, exertional sx, LE edema, palpitation, Ha's, visual disturbances, lightheadedness, hypotension, syncope.  CKD stage 3a - sees Dr. Juleen China: Reviewed last OV note and labs Chronic Kidney Disease stage IIIA with proteinuria and hyperkalemia: Secondary to diabetes, hypertension and NSAID use.  - Holding ACE-I/ARB due to history of hyperkalemia.  - Continue dapagliflozin '10mg'$  daily.   Anemia of chronic disease - H/H decreasing  Hemoglobin  Date Value Ref Range Status  08/29/2021 11.1 (L) 13.2 - 17.1 g/dL Final  05/30/2021 11.7 (L) 13.2  - 17.1 g/dL Final  03/14/2020 12.1 (L) 13.2 - 17.1 g/dL Final   GERD/barretts esophagus on pepcid and protonix No abd pain denies dysphagia   Current Outpatient Medications:    albuterol (PROAIR HFA) 108 (90 Base) MCG/ACT inhaler, Inhale 2 puffs into the lungs every 4 (four) hours as needed for wheezing or shortness of breath., Disp: 18 g, Rfl: 2   amLODipine (NORVASC) 2.5 MG tablet, Take 2.5 mg by mouth daily., Disp: , Rfl:    atorvastatin (LIPITOR) 80 MG tablet, Take 1 tablet (80 mg total) by mouth daily. At bedtime, Disp: 90 tablet, Rfl: 3   Cholecalciferol (VITAMIN D3 PO), Take 1 capsule by mouth daily., Disp: , Rfl:    ezetimibe (ZETIA) 10 MG tablet, Take 1 tablet (10 mg total) by mouth daily., Disp: 90 tablet, Rfl: 3   famotidine (PEPCID) 20 MG tablet, Take 1 tablet (20 mg total) by mouth 2 (two) times daily as needed for heartburn or indigestion., Disp: 180 tablet, Rfl: 1   FARXIGA 10 MG TABS tablet, Take 10 mg by mouth every morning., Disp: , Rfl:    fluticasone (FLONASE) 50 MCG/ACT nasal spray, Place 2 sprays into both nostrils daily as needed., Disp: 16 g, Rfl: 11   glimepiride (AMARYL) 1 MG tablet, Take 1 tablet (1 mg total) by mouth 2 (two) times daily as needed (with meals). Hold if blood sugars are <100 or  if not eating, Disp: 180 tablet, Rfl: 1   glucose blood (ACCU-CHEK AVIVA PLUS) test strip, Use once daily, Disp: 100 strip, Rfl: 0   Lancets (ACCU-CHEK MULTICLIX) lancets, USE AS DIRECTED, Disp: 102 each, Rfl: 12   levocetirizine (XYZAL) 5 MG tablet, TAKE 1 TABLET BY MOUTH ONCE DAILY IN THE EVENING, Disp: 90 tablet, Rfl: 0   metFORMIN (GLUCOPHAGE) 1000 MG tablet, TAKE 1 TABLET BY MOUTH TWICE DAILY WITH A MEAL, Disp: 180 tablet, Rfl: 0   pantoprazole (PROTONIX) 40 MG tablet, Take 1 tablet (40 mg total) by mouth daily., Disp: 90 tablet, Rfl: 3  Patient Active Problem List   Diagnosis Date Noted   Potential for cognitive impairment 05/30/2021   At risk for depressed mood  05/30/2021   Iron deficiency anemia 11/11/2017   DDD (degenerative disc disease), cervical 12/07/2015   GERD with esophagitis 09/21/2014   Barrett esophagus 09/21/2014   Cervical osteoarthritis 09/21/2014   Chronic kidney disease (CKD), stage III (moderate) ([place]) 09/21/2014   Controlled type 2 diabetes mellitus with diabetic nephropathy ([place]) 09/21/2014   Microalbuminuria 09/21/2014   Osteopenia 09/21/2014   HTN (hypertension) 05/20/2014   Hyperlipidemia 05/20/2014   Benign hypertensive kidney disease with chronic kidney disease 05/20/2014    Past Surgical History:  Procedure Laterality Date   CARPAL TUNNEL RELEASE Right    COLONOSCOPY  10/26/2012   repeat in 10 years MUS   COLONOSCOPY WITH PROPOFOL N/A 02/08/2019   Procedure: COLONOSCOPY WITH PROPOFOL;  Surgeon: Lollie Sails, MD;  Location: [facility] ENDOSCOPY;  Service: Endoscopy;  Laterality: N/A;   ESOPHAGOGASTRODUODENOSCOPY (EGD) WITH PROPOFOL N/A 10/22/2016   Procedure: ESOPHAGOGASTRODUODENOSCOPY (EGD) WITH PROPOFOL;  Surgeon: Lollie Sails, MD;  Location: [facility] ENDOSCOPY;  Service: Endoscopy;  Laterality: N/A;   ESOPHAGOGASTRODUODENOSCOPY (EGD) WITH PROPOFOL N/A 07/14/2018   Procedure: ESOPHAGOGASTRODUODENOSCOPY (EGD) WITH PROPOFOL;  Surgeon: Lucilla Lame, MD;  Location: [facility] ENDOSCOPY;  Service: Endoscopy;  Laterality: N/A;   FRACTURE SURGERY Left    arm   SHOULDER ARTHROSCOPY WITH SUBACROMIAL DECOMPRESSION AND BICEP TENDON REPAIR Right 04/16/2018   Procedure: ARTHROSCOPIC  BICEP TENOTOMY;  Surgeon: Thornton Park, MD;  Location: [facility] ORS;  Service: Orthopedics;  Laterality: Right;   UPPER ESOPHAGEAL ENDOSCOPIC ULTRASOUND (EUS)  6/15 ue to repeat in 1 year    Family History  Problem Relation Age of Onset   Hypertension Father    Heart attack Father    Heart disease Sister    Hypertension Sister     Social History   Tobacco Use   Smoking status: Never   Smokeless tobacco: Never  Vaping Use   Vaping Use: Never  used  Substance Use Topics   Alcohol use: No   Drug use: No     Allergies  Allergen Reactions   Losartan Potassium Other (See Comments)    hyperkalemia    Health Maintenance  Topic Date Due   FOOT EXAM  09/08/2021   URINE MICROALBUMIN  09/08/2021   OPHTHALMOLOGY EXAM  10/19/2021   COVID-19 Vaccine (4 - Pfizer series) 12/20/2021 (Originally 05/23/2020)   Zoster Vaccines- Shingrix (1 of 2) 03/06/2022 (Originally 01/10/1996)   INFLUENZA VACCINE  01/15/2022   HEMOGLOBIN A1C  03/01/2022   TETANUS/TDAP  04/03/2026   COLONOSCOPY (Pts 45-13yr Insurance coverage will need to be confirmed)  02/07/2029   Pneumonia Vaccine 76 Years old  Completed   Hepatitis C Screening  Completed   HPV VACCINES  Aged Out    Chart Review Today: I personally reviewed active problem list, medication  list, allergies, family history, social history, health maintenance, notes from last encounter, lab results, imaging with the patient/caregiver today.   Review of Systems  Constitutional: Negative.   HENT: Negative.    Eyes: Negative.   Respiratory: Negative.    Cardiovascular: Negative.   Gastrointestinal: Negative.   Endocrine: Negative.   Genitourinary: Negative.   Musculoskeletal: Negative.   Skin: Negative.   Allergic/Immunologic: Negative.   Neurological: Negative.   Hematological: Negative.   Psychiatric/Behavioral: Negative.    All other systems reviewed and are negative.    Objective:   Vitals:   12/04/21 0816  BP: 138/82  Pulse: 94  Resp: 18  Temp: 97.6 F (36.4 C)  TempSrc: Oral  SpO2: 99%  Weight: 190 lb 12.8 oz (86.5 kg)  Height: '5\' 11"'$  (1.803 m)    Body mass index is 26.61 kg/m.  Physical Exam Vitals and nursing note reviewed.  Constitutional:      General: He is not in acute distress.    Appearance: Normal appearance. He is well-developed. He is not ill-appearing, toxic-appearing or diaphoretic.     Interventions: Face mask in place.  HENT:     Head:  Normocephalic and atraumatic.     Jaw: No trismus.     Right Ear: External ear normal.     Left Ear: External ear normal.  Eyes:     General: Lids are normal. No scleral icterus.       Right eye: No discharge.        Left eye: No discharge.     Conjunctiva/sclera: Conjunctivae normal.  Neck:     Trachea: Trachea and phonation normal. No tracheal deviation.  Cardiovascular:     Rate and Rhythm: Normal rate and regular rhythm.     Pulses: Normal pulses.          Radial pulses are 2+ on the right side and 2+ on the left side.       Posterior tibial pulses are 2+ on the right side and 2+ on the left side.     Heart sounds: Normal heart sounds. No murmur heard.    No friction rub. No gallop.  Pulmonary:     Effort: Pulmonary effort is normal. No respiratory distress.     Breath sounds: Normal breath sounds. No stridor. No wheezing, rhonchi or rales.  Abdominal:     General: Bowel sounds are normal. There is no distension.     Palpations: Abdomen is soft.  Musculoskeletal:     Right lower leg: No edema.     Left lower leg: No edema.  Skin:    General: Skin is warm and dry.     Coloration: Skin is pale (his baseline). Skin is not jaundiced.     Findings: No rash.     Nails: There is no clubbing.  Neurological:     Mental Status: He is alert. Mental status is at baseline.     Cranial Nerves: No dysarthria or facial asymmetry.     Motor: No tremor or abnormal muscle tone.     Gait: Gait normal.  Psychiatric:        Mood and Affect: Mood normal.        Speech: Speech normal.        Behavior: Behavior normal. Behavior is cooperative.         Assessment & Plan:   Problem List Items Addressed This Visit       Cardiovascular and Mediastinum   HTN (hypertension) - Primary  BP in acceptable range, but trending up slightly for him, if any further increase would change norvasc 2.5 to 5 mg daily BP Readings from Last 5 Encounters:  12/04/21 138/82  08/29/21 132/74  05/30/21  130/72  03/02/21 128/70  09/08/20 122/72  Goal ~ 130/80        Relevant Orders   COMPLETE METABOLIC PANEL WITH GFR     Respiratory   Reactive airway disease without complication    He request refill on inhaler Lungs CTA No recent exacerbations        Digestive   GERD with esophagitis    Continue long term ppi and pepcid as needed      Barrett esophagus    Continue meds per GI        Endocrine   Controlled type 2 diabetes mellitus with diabetic nephropathy ([place])    Lab Results  Component Value Date   HGBA1C 7.9 (H) 08/29/2021  Goal >8.0 He is holding glimepiride if sugars are under 100 or if not eating, no hypoglycemia episodes, reports sugars are 100-120 fasting On metformin and farxiga for renal protection per nephro actos is on chart but I do not believe he is taking this  (last prescribed in 2021, d/c from chart march 2022 and somehow put back on chart last year as historical provider - cannot see any pharmacy dispense of this no actual Rx)   Foot exam done today He is not on acei/arb due to hyperkalemia - seeing nephrology On statin Due for DM eye exam       Relevant Orders   Ambulatory referral to Ophthalmology   COMPLETE METABOLIC PANEL WITH GFR   Hemoglobin A1c   Secondary hyperparathyroidism of renal origin ([place])    Per nephro Pt asks to have all labs for PCP and nephro done today        Genitourinary   Chronic kidney disease (CKD), stage III (moderate) ([place])    Per nephrology Last OV reviewed They have recommended farxiga which pt is taking and tolerating      Relevant Orders   COMPLETE METABOLIC PANEL WITH GFR   Hemoglobin A1c   Parathyroid hormone, intact (no Ca)     Hematopoietic and Hemostatic   Essential (hemorrhagic) thrombocythemia ([place])    Stable thrombocytosis - monitoring        Other   Hyperlipidemia    Patient compliant with Lipitor 80 mg and Zetia, last lipid panel was reviewed      Iron deficiency anemia    Anemia  secondary to chronic diseases, CKD, monitoring      Potential for cognitive impairment    Pt comes alone today, prior provider was concerned about cognitive function and encouraged him to come with someone for further assessment or f/up Pt appears at his baseline mood and cognition Will refer to neurology if pt desires or if there is a decline      Current moderate episode of major depressive disorder, unspecified whether recurrent (HCC)    Chronic, not currently on meds    12/04/2021    8:14 AM 08/29/2021    8:48 AM 05/30/2021   10:22 AM  Depression screen PHQ 2/9  Decreased Interest 0 1 0  Down, Depressed, Hopeless 0 0 0  PHQ - 2 Score 0 1 0  Altered sleeping 0 0 0  Tired, decreased energy 0 0 0  Change in appetite 0 0 0  Feeling bad or failure about yourself  0 0 0  Trouble concentrating  0 0 0  Moving slowly or fidgety/restless 0 0 0  Suicidal thoughts 0 0 0  PHQ-9 Score 0 1 0  Difficult doing work/chores Not difficult at all Not difficult at all Not difficult at all   phq reviewed and negative        Nephrology labs ordered today per his request - I did defer the urine testing to nephrology because we do not seem to have all the same urine testing available -encouraged the patient to keep his lab appointment with nephrology and his next follow-up appointment I will route labs to Dr. Juleen China  Return in about 6 months (around 06/05/2022) for Routine follow-up.   Delsa Grana, PA-C 12/04/21 8:49 AM

## 2021-12-04 NOTE — Assessment & Plan Note (Signed)
Pt comes alone today, prior provider was concerned about cognitive function and encouraged him to come with someone for further assessment or f/up Pt appears at his baseline mood and cognition Will refer to neurology if pt desires or if there is a decline

## 2021-12-04 NOTE — Assessment & Plan Note (Signed)
Per nephro Pt asks to have all labs for PCP and nephro done today

## 2021-12-04 NOTE — Assessment & Plan Note (Signed)
BP in acceptable range, but trending up slightly for him, if any further increase would change norvasc 2.5 to 5 mg daily BP Readings from Last 5 Encounters:  12/04/21 138/82  08/29/21 132/74  05/30/21 130/72  03/02/21 128/70  09/08/20 122/72  Goal ~ 130/80

## 2021-12-04 NOTE — Assessment & Plan Note (Signed)
He request refill on inhaler Lungs CTA No recent exacerbations

## 2021-12-04 NOTE — Assessment & Plan Note (Signed)
Stable thrombocytosis - monitoring

## 2021-12-04 NOTE — Assessment & Plan Note (Signed)
Per nephrology Last OV reviewed They have recommended farxiga which pt is taking and tolerating

## 2021-12-04 NOTE — Assessment & Plan Note (Signed)
Lab Results  Component Value Date   HGBA1C 7.9 (H) 08/29/2021   Goal >8.0 He is holding glimepiride if sugars are under 100 or if not eating, no hypoglycemia episodes, reports sugars are 100-120 fasting On metformin and farxiga for renal protection per nephro actos is on chart but I do not believe he is taking this  (last prescribed in 2021, d/c from chart march 2022 and somehow put back on chart last year as historical provider - cannot see any pharmacy dispense of this no actual Rx)   Foot exam done today He is not on acei/arb due to hyperkalemia - seeing nephrology On statin Due for DM eye exam

## 2021-12-04 NOTE — Assessment & Plan Note (Signed)
>>  ASSESSMENT AND PLAN FOR CHRONIC KIDNEY DISEASE (CKD), STAGE III (MODERATE) (HCC) WRITTEN ON 12/04/2021  4:56 PM BY TAPIA, LEISA, PA-C  Per nephrology Last OV reviewed They have recommended farxiga which pt is taking and tolerating

## 2021-12-04 NOTE — Assessment & Plan Note (Signed)
Patient compliant with Lipitor 80 mg and Zetia, last lipid panel was reviewed

## 2021-12-04 NOTE — Assessment & Plan Note (Signed)
Continue long term ppi and pepcid as needed

## 2021-12-05 LAB — CBC WITH DIFFERENTIAL/PLATELET
Absolute Monocytes: 636 cells/uL (ref 200–950)
Basophils Absolute: 78 cells/uL (ref 0–200)
Basophils Relative: 1.3 %
Eosinophils Absolute: 420 cells/uL (ref 15–500)
Eosinophils Relative: 7 %
HCT: 34.9 % — ABNORMAL LOW (ref 38.5–50.0)
Hemoglobin: 11.2 g/dL — ABNORMAL LOW (ref 13.2–17.1)
Lymphs Abs: 1734 cells/uL (ref 850–3900)
MCH: 25.1 pg — ABNORMAL LOW (ref 27.0–33.0)
MCHC: 32.1 g/dL (ref 32.0–36.0)
MCV: 78.1 fL — ABNORMAL LOW (ref 80.0–100.0)
MPV: 10.5 fL (ref 7.5–12.5)
Monocytes Relative: 10.6 %
Neutro Abs: 3132 cells/uL (ref 1500–7800)
Neutrophils Relative %: 52.2 %
Platelets: 414 10*3/uL — ABNORMAL HIGH (ref 140–400)
RBC: 4.47 10*6/uL (ref 4.20–5.80)
RDW: 17.1 % — ABNORMAL HIGH (ref 11.0–15.0)
Total Lymphocyte: 28.9 %
WBC: 6 10*3/uL (ref 3.8–10.8)

## 2021-12-05 LAB — COMPLETE METABOLIC PANEL WITH GFR
AG Ratio: 1.9 (calc) (ref 1.0–2.5)
ALT: 12 U/L (ref 9–46)
AST: 17 U/L (ref 10–35)
Albumin: 4.6 g/dL (ref 3.6–5.1)
Alkaline phosphatase (APISO): 112 U/L (ref 35–144)
BUN/Creatinine Ratio: 9 (calc) (ref 6–22)
BUN: 13 mg/dL (ref 7–25)
CO2: 21 mmol/L (ref 20–32)
Calcium: 9.4 mg/dL (ref 8.6–10.3)
Chloride: 106 mmol/L (ref 98–110)
Creat: 1.42 mg/dL — ABNORMAL HIGH (ref 0.70–1.28)
Globulin: 2.4 g/dL (calc) (ref 1.9–3.7)
Glucose, Bld: 160 mg/dL — ABNORMAL HIGH (ref 65–99)
Potassium: 4.8 mmol/L (ref 3.5–5.3)
Sodium: 138 mmol/L (ref 135–146)
Total Bilirubin: 0.6 mg/dL (ref 0.2–1.2)
Total Protein: 7 g/dL (ref 6.1–8.1)
eGFR: 52 mL/min/{1.73_m2} — ABNORMAL LOW (ref >=60)

## 2021-12-05 LAB — MICROALBUMIN, URINE: Microalb, Ur: 0.8 mg/dL

## 2021-12-05 LAB — HEMOGLOBIN A1C
Hgb A1c MFr Bld: 7.5 % of total Hgb — ABNORMAL HIGH (ref <5.7)
Mean Plasma Glucose: 169 mg/dL
eAG (mmol/L): 9.3 mmol/L

## 2021-12-05 LAB — PARATHYROID HORMONE, INTACT (NO CA): PTH: 57 pg/mL (ref 16–77)

## 2021-12-13 DIAGNOSIS — M25562 Pain in left knee: Secondary | ICD-10-CM

## 2021-12-13 DIAGNOSIS — M1712 Unilateral primary osteoarthritis, left knee: Secondary | ICD-10-CM | POA: Insufficient documentation

## 2021-12-13 DIAGNOSIS — S83419A Sprain of medial collateral ligament of unspecified knee, initial encounter: Secondary | ICD-10-CM | POA: Insufficient documentation

## 2021-12-13 HISTORY — DX: Pain in left knee: M25.562

## 2021-12-25 ENCOUNTER — Other Ambulatory Visit: Payer: Self-pay | Admitting: Family Medicine

## 2021-12-25 DIAGNOSIS — E1121 Type 2 diabetes mellitus with diabetic nephropathy: Secondary | ICD-10-CM

## 2022-01-22 ENCOUNTER — Other Ambulatory Visit: Payer: Self-pay | Admitting: Family Medicine

## 2022-01-22 DIAGNOSIS — Z9109 Other allergy status, other than to drugs and biological substances: Secondary | ICD-10-CM

## 2022-01-23 ENCOUNTER — Other Ambulatory Visit: Payer: Self-pay

## 2022-01-23 DIAGNOSIS — Z9109 Other allergy status, other than to drugs and biological substances: Secondary | ICD-10-CM

## 2022-01-28 ENCOUNTER — Ambulatory Visit (INDEPENDENT_AMBULATORY_CARE_PROVIDER_SITE_OTHER): Payer: Medicare Other | Admitting: Family Medicine

## 2022-01-28 ENCOUNTER — Encounter: Payer: Self-pay | Admitting: Family Medicine

## 2022-01-28 VITALS — Ht 71.0 in | Wt 190.0 lb

## 2022-01-28 DIAGNOSIS — J069 Acute upper respiratory infection, unspecified: Secondary | ICD-10-CM

## 2022-01-28 DIAGNOSIS — R509 Fever, unspecified: Secondary | ICD-10-CM

## 2022-01-28 DIAGNOSIS — J45909 Unspecified asthma, uncomplicated: Secondary | ICD-10-CM

## 2022-01-28 DIAGNOSIS — R519 Headache, unspecified: Secondary | ICD-10-CM | POA: Diagnosis not present

## 2022-01-28 LAB — POCT INFLUENZA A/B
Influenza A, POC: NEGATIVE
Influenza B, POC: NEGATIVE

## 2022-01-28 MED ORDER — PREDNISONE 20 MG PO TABS: 40.0000 mg | ORAL_TABLET | Freq: Every day | ORAL | 0 refills | Status: AC

## 2022-01-28 MED ORDER — BENZONATATE 100 MG PO CAPS: 100.0000 mg | ORAL_CAPSULE | Freq: Three times a day (TID) | ORAL | 1 refills | Status: DC | PRN

## 2022-01-28 NOTE — Progress Notes (Unsigned)
Name: Paul Spears   MRN: [id number]    DOB: 10/22/1945   Date:01/28/2022       Progress Note  Subjective:    Chief Complaint  Chief Complaint  Patient presents with   URI   Headache    I connected with  [name] on 01/28/22 at 11:40 AM EDT by telephone and verified that I am speaking with the correct person using two identifiers.   I discussed the limitations, risks, security and privacy concerns of performing an evaluation and management service by telephone and the availability of in person appointments. Staff also discussed with the patient that there may be a patient responsible charge related to this service.  Patient verbalized understanding and agreed to proceed with encounter. Patient Location: home Provider Location: [facility] Additional Individuals present: none  HPI  2 days of nasal sx, HA, coughing, started with sneezing and congestion T-max 100.3  Cough, using albuterol inhaler "couldn't hardly breath" but denies chest tightness or wheeze     Patient Active Problem List   Diagnosis Date Noted   Pain in joint of left knee 12/13/2021   Arthritis of left knee 12/13/2021   Sprain of MCL (medial collateral ligament) of knee 12/13/2021   Secondary hyperparathyroidism of renal origin ([place]) 12/04/2021   Reactive airway disease without complication 38/75/6433   Current moderate episode of major depressive disorder, unspecified whether recurrent ([place]) 12/04/2021   Pain of left forearm 09/12/2021   Potential for cognitive impairment 05/30/2021   At risk for depressed mood 05/30/2021   Secondary hyperparathyroidism of renal origin ([place]) 03/07/2021   Acquired trigger finger of right ring finger 09/24/2019   Anemia in chronic kidney disease 08/23/2019   Essential (hemorrhagic) thrombocythemia ([place])    Iron deficiency anemia 11/11/2017   DDD (degenerative disc disease), cervical 12/07/2015   GERD with esophagitis 09/21/2014   Barrett esophagus 09/21/2014    Cervical osteoarthritis 09/21/2014   Chronic kidney disease (CKD), stage III (moderate) ([place]) 09/21/2014   Controlled type 2 diabetes mellitus with diabetic nephropathy ([place]) 09/21/2014   Microalbuminuria 09/21/2014   Osteopenia 09/21/2014   Stage 3a chronic kidney disease ([place]) 09/21/2014   HTN (hypertension) 05/20/2014   Hyperlipidemia 05/20/2014   Benign hypertensive kidney disease with chronic kidney disease 05/20/2014   Abnormal respiratory rate 05/20/2014   Benign hypertensive kidney disease with chronic kidney disease 05/20/2014   Essential hypertension 05/20/2014    Social History   Tobacco Use   Smoking status: Never   Smokeless tobacco: Never  Substance Use Topics   Alcohol use: No     Current Outpatient Medications:    albuterol (PROAIR HFA) 108 (90 Base) MCG/ACT inhaler, Inhale 2 puffs into the lungs every 4 (four) hours as needed for wheezing or shortness of breath., Disp: 18 g, Rfl: 2   amLODipine (NORVASC) 2.5 MG tablet, Take 2.5 mg by mouth daily., Disp: , Rfl:    atorvastatin (LIPITOR) 80 MG tablet, Take 1 tablet (80 mg total) by mouth daily. At bedtime, Disp: 90 tablet, Rfl: 3   Cholecalciferol (VITAMIN D3 PO), Take 1 capsule by mouth daily., Disp: , Rfl:    ezetimibe (ZETIA) 10 MG tablet, Take 1 tablet (10 mg total) by mouth daily., Disp: 90 tablet, Rfl: 3   famotidine (PEPCID) 20 MG tablet, Take 1 tablet (20 mg total) by mouth 2 (two) times daily as needed for heartburn or indigestion., Disp: 180 tablet, Rfl: 1   FARXIGA 10 MG TABS tablet, Take 10 mg by  mouth every morning., Disp: , Rfl:    fluticasone (FLONASE) 50 MCG/ACT nasal spray, Place 2 sprays into both nostrils daily as needed., Disp: 16 g, Rfl: 11   glimepiride (AMARYL) 1 MG tablet, TAKE 1 TABLET BY MOUTH TWICE DAILY WITH MEALS AS NEEDED -  HOLD  IF  BLOOD  SUGARS  ARE  LESS  THAN  100  OR  IF  NOT  EATING, Disp: 180 tablet, Rfl: 0   glucose blood (ACCU-CHEK AVIVA PLUS) test strip, Use once daily, Disp:  100 strip, Rfl: 0   Lancets (ACCU-CHEK MULTICLIX) lancets, USE AS DIRECTED, Disp: 102 each, Rfl: 12   levocetirizine (XYZAL) 5 MG tablet, TAKE 1 TABLET BY MOUTH ONCE DAILY IN THE EVENING, Disp: 90 tablet, Rfl: 3   metFORMIN (GLUCOPHAGE) 1000 MG tablet, TAKE 1 TABLET BY MOUTH TWICE DAILY WITH A MEAL, Disp: 180 tablet, Rfl: 0   pantoprazole (PROTONIX) 40 MG tablet, Take 1 tablet (40 mg total) by mouth daily., Disp: 90 tablet, Rfl: 3  Allergies  Allergen Reactions   Losartan Potassium Other (See Comments)    hyperkalemia    Chart Review: I personally reviewed active problem list, medication list, allergies, family history, social history, health maintenance, notes from last encounter, lab results, imaging with the patient/caregiver today.   Review of Systems  Constitutional: Negative.   HENT: Negative.    Eyes: Negative.   Respiratory: Negative.    Cardiovascular: Negative.   Gastrointestinal: Negative.   Endocrine: Negative.   Genitourinary: Negative.   Musculoskeletal: Negative.   Skin: Negative.   Allergic/Immunologic: Negative.   Neurological: Negative.   Hematological: Negative.   Psychiatric/Behavioral: Negative.    All other systems reviewed and are negative.    Objective:    Virtual encounter, vitals limited, only able to obtain the following Today's Vitals   01/28/22 1111  Weight: 190 lb (86.2 kg)  Height: '5\' 11"'$  (1.803 m)  PainSc: 0-No pain   Body mass index is 26.5 kg/m. Nursing Note and Vital Signs reviewed.  Physical Exam Vitals and nursing note reviewed.  Pulmonary:     Effort: No respiratory distress.     Comments: Able to speak in full and complete sentences, no audible wheeze or distress Neurological:     Mental Status: He is alert.     PE limited by telephone encounter  No results found for this or any previous visit (from the past 72 hour(s)).  Assessment and Plan:     ICD-10-CM   1. Upper respiratory tract infection, unspecified type   J06.9 predniSONE (DELTASONE) 20 MG tablet    benzonatate (TESSALON) 100 MG capsule    POCT Influenza A/B    Novel Coronavirus, NAA ([facility])    2. Reactive airway disease without complication, unspecified asthma severity, unspecified whether persistent  J45.909 predniSONE (DELTASONE) 20 MG tablet    POCT Influenza A/B    3. Fever, unspecified fever cause  R50.9 Novel Coronavirus, NAA ([facility])    4. Acute nonintractable headache, unspecified headache type  R51.9 Novel Coronavirus, NAA ([facility])      URI sx with fever onset yesterday, hx of lung disease, test for flu in clinic, home covid test  Start steroid and cough meds and OTC measures for sx - tylenol/cough meds/allergy meds Tx for flu or covid if positive F/up in 1-2 weeks if not improving  -Red flags and when to present for emergency care or RTC including but not limited to new/worsening/un-resolving symptoms,  reviewed with patient at time of visit.  Follow up and care instructions discussed and provided in AVS. - I discussed the assessment and treatment plan with the patient. The patient was provided an opportunity to ask questions and all were answered. The patient agreed with the plan and demonstrated an understanding of the instructions.  - The patient was advised to call back or seek an in-person evaluation if the symptoms worsen or if the condition fails to improve as anticipated.  I provided 15 minutes of non-face-to-face time during this encounter.  Delsa Grana, PA-C 01/28/22 11:44 AM

## 2022-01-29 LAB — NOVEL CORONAVIRUS, NAA: SARS-CoV-2, NAA: NOT DETECTED

## 2022-01-30 ENCOUNTER — Ambulatory Visit: Payer: Self-pay

## 2022-01-30 ENCOUNTER — Telehealth: Payer: Self-pay | Admitting: Family Medicine

## 2022-01-30 NOTE — Telephone Encounter (Signed)
This is not a patient at [facility].

## 2022-01-30 NOTE — Telephone Encounter (Signed)
Unable to reorder this medication due to diagnosis code needed.

## 2022-01-30 NOTE — Telephone Encounter (Signed)
Lab results were negative I sent in steroids - its common to increase blood sugars while taking it and they will return to normal after the prednisone is complete. I cannot really evaluate his HR through msgs - if its bothering him I recommend he come in for [place] to do vital signs and eval and examine him.  Delsa Grana, PA-C

## 2022-01-30 NOTE — Telephone Encounter (Signed)
Medication Refill - Medication: glucose blood (ACCU-CHEK AVIVA PLUS) test strip [[id number]]   Has the patient contacted their pharmacy? Yes.   (Agent: If no, request that the patient contact the pharmacy for the refill. If patient does not wish to contact the pharmacy document the reason why and proceed with request.) (Agent: If yes, when and what did the pharmacy advise?)  Preferred Pharmacy (with phone number or street name): [place], [place] - [place]  [address] [place] [address]  Phone:  [phone]  Fax:  [phone]  DEA #:  Has the patient been seen for an appointment in the last year OR does the patient have an upcoming appointment? Yes.    Agent: Please be advised that RX refills may take up to 3 business days. We ask that you follow-up with your pharmacy.

## 2022-01-30 NOTE — Telephone Encounter (Signed)
Summary: blood sugar / heart rate   Pt called to get lab results ut also mention his Blood sugar and how he couldn't sleep last night due to his heart rate / please advise      Reason for Disposition . Blood glucose 70-240 mg/dL (3.9 -13.3 mmol/L)  Answer Assessment - Initial Assessment Questions 1. BLOOD GLUCOSE: "What is your blood glucose level?"      206 2. ONSET: "When did you check the blood glucose?"     A few minutes ago 3. USUAL RANGE: "What is your glucose level usually?" (e.g., usual fasting morning value, usual evening value)     Per conversation glucose states were in the low 100's 4. KETONES: "Do you check for ketones (urine or blood test strips)?" If Yes, ask: "What does the test show now?"      no 5. TYPE 1 or 2:  "Do you know what type of diabetes you have?"  (e.g., Type 1, Type 2, Gestational; doesn't know)      Type 2 6. INSULIN: "Do you take insulin?" "What type of insulin(s) do you use? What is the mode of delivery? (syringe, pen; injection or pump)?"      no 7. DIABETES PILLS: "Do you take any pills for your diabetes?" If Yes, ask: "Have you missed taking any pills recently?"     No really good taking them recently 8. OTHER SYMPTOMS: "Do you have any symptoms?" (e.g., fever, frequent urination, difficulty breathing, dizziness, weakness, vomiting)     A little dizziness 9. PREGNANCY: "Is there any chance you are pregnant?" "When was your last menstrual period?"     na  Protocols used: Diabetes - High Blood Sugar-A-AH

## 2022-01-30 NOTE — Telephone Encounter (Signed)
   Chief Complaint: Wanted lab results. - Mentioned to agent that he was having a fast HR d/t sugar levels. Symptoms: high blood glucose levels Frequency: past few days Pertinent Negatives: Patient denies  Disposition: '[]'$ ED /'[]'$ Urgent Care (no appt availability in office) / '[]'$ Appointment(In office/virtual)/ '[]'$  [facility]/ '[]'$ Home Care/ '[]'$ Refused Recommended Disposition /'[]'$ [facility]/ '[x]'$  Follow-up with PCP Additional Notes: Pt states that he hasn't been good about taking his diabetes medication. His sister passed recently and service was held last week. Blood sugars have been just over 200 for the last few readings. Pt has requested a refill on test strips. Unclear it pt is using correct strips for his machine, more have been ordered.  Pt will take medications as prescribed and call back if sugars remain elevated.   Shared provider's note regarding test results.   No further questions.       Summary: blood sugar / heart rate   Pt called to get lab results ut also mention his Blood sugar and how he couldn't sleep last night due to his heart rate / please advise               Delsa Grana, PA-C  01/29/2022  6:08 PM EDT     Covid and flu negative - likely other viral upper respiratory infection - tx conservative - otc meds for symptom relief, likely to resolve on its own, f/up if any worsening

## 2022-01-31 NOTE — Telephone Encounter (Signed)
Pt has been notified and unsure if will pick up rx that was sent over. He will follow up with [place] if he believes it is necessary.

## 2022-02-11 ENCOUNTER — Other Ambulatory Visit: Payer: Self-pay | Admitting: Family Medicine

## 2022-02-11 DIAGNOSIS — K2100 Gastro-esophageal reflux disease with esophagitis, without bleeding: Secondary | ICD-10-CM

## 2022-02-19 ENCOUNTER — Other Ambulatory Visit: Payer: Self-pay

## 2022-02-19 DIAGNOSIS — K2100 Gastro-esophageal reflux disease with esophagitis, without bleeding: Secondary | ICD-10-CM

## 2022-02-19 MED ORDER — FAMOTIDINE 20 MG PO TABS: 20.0000 mg | ORAL_TABLET | Freq: Two times a day (BID) | ORAL | 1 refills | Status: DC | PRN

## 2022-03-26 ENCOUNTER — Other Ambulatory Visit: Payer: Self-pay | Admitting: Family Medicine

## 2022-03-26 ENCOUNTER — Telehealth: Payer: Self-pay | Admitting: Family Medicine

## 2022-03-26 DIAGNOSIS — E782 Mixed hyperlipidemia: Secondary | ICD-10-CM

## 2022-03-26 DIAGNOSIS — Z9109 Other allergy status, other than to drugs and biological substances: Secondary | ICD-10-CM

## 2022-03-26 DIAGNOSIS — J069 Acute upper respiratory infection, unspecified: Secondary | ICD-10-CM

## 2022-03-26 DIAGNOSIS — E1121 Type 2 diabetes mellitus with diabetic nephropathy: Secondary | ICD-10-CM

## 2022-03-26 DIAGNOSIS — K2100 Gastro-esophageal reflux disease with esophagitis, without bleeding: Secondary | ICD-10-CM

## 2022-03-26 NOTE — Telephone Encounter (Signed)
Pt has came in saying that he has changed his pharmacy to [facility] on [address]. Not [facility] on [address]. Anymore.

## 2022-03-26 NOTE — Telephone Encounter (Signed)
Medication Refill - Medication: atorvastatin (LIPITOR) 80 MG tablet, ezetimibe (ZETIA) 10 MG tablet, amLODipine (NORVASC) 2.5 MG tablet, pantoprazole (PROTONIX) 40 MG tablet, benzonatate (TESSALON) 100 MG capsule, levocetirizine (XYZAL) 5 MG tablet, famotidine (PEPCID) 20 MG tablet, glimepiride (AMARYL) 1 MG tablet, metFORMIN (GLUCOPHAGE) 1000 MG tablet, FARXIGA 10 MG TABS tablet  Pt stated he needs medication refills. Pt mentioned that certain medications he did not know what they were for, but he would look them up on the internet. Pt also mentioned he needs all medication sent to new pharmacy below.   Has the patient contacted their pharmacy? Yes.    (Agent: If yes, when and what did the pharmacy advise?)  Preferred Pharmacy (with phone number or street name):  [id number]  [address] [address] Phone: [phone]: [phone] Hours: Not open 24 hours   Has the patient been seen for an appointment in the last year OR does the patient have an upcoming appointment? Yes.    Agent: Please be advised that RX refills may take up to 3 business days. We ask that you follow-up with your pharmacy.

## 2022-03-26 NOTE — Telephone Encounter (Signed)
[facility] pharmacy has been removed

## 2022-03-27 MED ORDER — PANTOPRAZOLE SODIUM 40 MG PO TBEC: 40.0000 mg | DELAYED_RELEASE_TABLET | Freq: Every day | ORAL | 3 refills | Status: DC

## 2022-03-27 MED ORDER — EZETIMIBE 10 MG PO TABS: 10.0000 mg | ORAL_TABLET | Freq: Every day | ORAL | 3 refills | Status: DC

## 2022-03-27 MED ORDER — GLIMEPIRIDE 1 MG PO TABS: ORAL_TABLET | ORAL | 0 refills | Status: DC

## 2022-03-27 MED ORDER — METFORMIN HCL 1000 MG PO TABS: 1000.0000 mg | ORAL_TABLET | Freq: Two times a day (BID) | ORAL | 0 refills | Status: DC

## 2022-03-27 MED ORDER — ATORVASTATIN CALCIUM 80 MG PO TABS: 80.0000 mg | ORAL_TABLET | Freq: Every day | ORAL | 3 refills | Status: DC

## 2022-03-27 MED ORDER — FAMOTIDINE 20 MG PO TABS: 20.0000 mg | ORAL_TABLET | Freq: Two times a day (BID) | ORAL | 1 refills | Status: DC | PRN

## 2022-03-27 MED ORDER — LEVOCETIRIZINE DIHYDROCHLORIDE 5 MG PO TABS: 5.0000 mg | ORAL_TABLET | Freq: Every evening | ORAL | 3 refills | Status: DC

## 2022-03-27 NOTE — Telephone Encounter (Signed)
Requested medication (s) are due for refill today: yes  Requested medication (s) are on the active medication list:yes  Last refill:  various dates  Future visit scheduled:yes  Notes to clinic:  Unable to refill per protocol, some medications were historical medications or expired. Patient unsure of all medications, routing for review.      Requested Prescriptions  Pending Prescriptions Disp Refills   atorvastatin (LIPITOR) 80 MG tablet 90 tablet 3    Sig: Take 1 tablet (80 mg total) by mouth daily. At bedtime     Cardiovascular:  Antilipid - Statins Failed - 03/26/2022  4:23 PM      Failed - Lipid Panel in normal range within the last 12 months    Cholesterol, Total  Date Value Ref Range Status  10/15/2016 172 100 - 199 mg/dL Final   Cholesterol  Date Value Ref Range Status  05/30/2021 104 <200 mg/dL Final   LDL Cholesterol (Calc)  Date Value Ref Range Status  05/30/2021 50 mg/dL (calc) Final    Comment:    Reference range: <100 . Desirable range <100 mg/dL for primary prevention;   <70 mg/dL for patients with CHD or diabetic patients  with > or = 2 CHD risk factors. . LDL-C is now calculated using the Martin-Hopkins  calculation, which is a validated novel method providing  better accuracy than the Friedewald equation in the  estimation of LDL-C.  Martin SS et al. JAMA. 2013;310(19): 2061-2068  (http://education.[facility].com/faq/FAQ164)    HDL  Date Value Ref Range Status  05/30/2021 37 (L) > OR = 40 mg/dL Final  10/15/2016 39 (L) >39 mg/dL Final   Triglycerides  Date Value Ref Range Status  05/30/2021 86 <150 mg/dL Final         Passed - Patient is not pregnant      Passed - Valid encounter within last 12 months    Recent Outpatient Visits           1 month ago Upper respiratory tract infection, unspecified type   [facility] Tapia, Leisa, PA-C   3 months ago Primary hypertension   [facility] Tapia,  Leisa, PA-C   7 months ago Controlled type 2 diabetes mellitus with diabetic nephropathy, without long-term current use of insulin (HCC)   [facility] Mecum, Erin E, PA-C   10 months ago Controlled type 2 diabetes mellitus with diabetic nephropathy, without long-term current use of insulin (HCC)   [facility] Mecum, Erin E, PA-C   1 year ago Controlled type 2 diabetes mellitus with diabetic nephropathy, without long-term current use of insulin (HCC)   [facility] Tapia, Leisa, PA-C       Future Appointments             In 2 months Tapia, Leisa, PA-C [facility]             ezetimibe (ZETIA) 10 MG tablet 90 tablet 3    Sig: Take 1 tablet (10 mg total) by mouth daily.     Cardiovascular:  Antilipid - Sterol Transport Inhibitors Failed - 03/26/2022  4:23 PM      Failed - Lipid Panel in normal range within the last 12 months    Cholesterol, Total  Date Value Ref Range Status  10/15/2016 172 100 - 199 mg/dL Final   Cholesterol  Date Value Ref Range Status  05/30/2021 104 <200 mg/dL Final   LDL Cholesterol (Calc)  Date   Value Ref Range Status  05/30/2021 50 mg/dL (calc) Final    Comment:    Reference range: <100 . Desirable range <100 mg/dL for primary prevention;   <70 mg/dL for patients with CHD or diabetic patients  with > or = 2 CHD risk factors. . LDL-C is now calculated using the Martin-Hopkins  calculation, which is a validated novel method providing  better accuracy than the Friedewald equation in the  estimation of LDL-C.  Martin SS et al. JAMA. 2013;310(19): 2061-2068  (http://education.[facility].com/faq/FAQ164)    HDL  Date Value Ref Range Status  05/30/2021 37 (L) > OR = 40 mg/dL Final  10/15/2016 39 (L) >39 mg/dL Final   Triglycerides  Date Value Ref Range Status  05/30/2021 86 <150 mg/dL Final         Passed - AST in normal range and within 360 days    AST   Date Value Ref Range Status  12/04/2021 17 10 - 35 U/L Final         Passed - ALT in normal range and within 360 days    ALT  Date Value Ref Range Status  12/04/2021 12 9 - 46 U/L Final         Passed - Patient is not pregnant      Passed - Valid encounter within last 12 months    Recent Outpatient Visits           1 month ago Upper respiratory tract infection, unspecified type   [facility] Tapia, Leisa, PA-C   3 months ago Primary hypertension   [facility] Tapia, Leisa, PA-C   7 months ago Controlled type 2 diabetes mellitus with diabetic nephropathy, without long-term current use of insulin (HCC)   [facility] Mecum, Erin E, PA-C   10 months ago Controlled type 2 diabetes mellitus with diabetic nephropathy, without long-term current use of insulin (HCC)   [facility] Mecum, Erin E, PA-C   1 year ago Controlled type 2 diabetes mellitus with diabetic nephropathy, without long-term current use of insulin (HCC)   [facility] Tapia, Leisa, PA-C       Future Appointments             In 2 months Tapia, Leisa, PA-C [facility]             amLODipine (NORVASC) 2.5 MG tablet      Sig: Take 1 tablet (2.5 mg total) by mouth daily.     Cardiovascular: Calcium Channel Blockers 2 Passed - 03/26/2022  4:23 PM      Passed - Last BP in normal range    BP Readings from Last 1 Encounters:  12/04/21 138/82         Passed - Last Heart Rate in normal range    Pulse Readings from Last 1 Encounters:  12/04/21 94         Passed - Valid encounter within last 6 months    Recent Outpatient Visits           1 month ago Upper respiratory tract infection, unspecified type   [facility] Tapia, Leisa, PA-C   3 months ago Primary hypertension   [facility] Tapia, Leisa, PA-C   7 months ago Controlled type 2 diabetes  mellitus with diabetic nephropathy, without long-term current use of insulin (HCC)   [facility] Mecum, Erin E, PA-C   10 months   ago Controlled type 2 diabetes mellitus with diabetic nephropathy, without long-term current use of insulin ([place])   [facility] Mecum, Erin E, PA-C   1 year ago Controlled type 2 diabetes mellitus with diabetic nephropathy, without long-term current use of insulin [facility])   [facility] Delsa Grana, PA-C       Future Appointments             In 2 months Delsa Grana, PA-C [facility]             pantoprazole (PROTONIX) 40 MG tablet 90 tablet 3    Sig: Take 1 tablet (40 mg total) by mouth daily.     Gastroenterology: Proton Pump Inhibitors Passed - 03/26/2022  4:23 PM      Passed - Valid encounter within last 12 months    Recent Outpatient Visits           1 month ago Upper respiratory tract infection, unspecified type   [facility] Delsa Grana, PA-C   3 months ago Primary hypertension   [facility] Delsa Grana, PA-C   7 months ago Controlled type 2 diabetes mellitus with diabetic nephropathy, without long-term current use of insulin ([place])   [place], PA-C   10 months ago Controlled type 2 diabetes mellitus with diabetic nephropathy, without long-term current use of insulin ([place])   [place], PA-C   1 year ago Controlled type 2 diabetes mellitus with diabetic nephropathy, without long-term current use of insulin ([place])   [facility] Delsa Grana, PA-C       Future Appointments             In 2 months Delsa Grana, PA-C [facility]             benzonatate (TESSALON) 100 MG capsule 30 capsule 1    Sig: Take 1-2 capsules (100-200 mg total) by mouth 3 (three) times daily as needed for cough.     Ear,  Nose, and Throat:  Antitussives/Expectorants Passed - 03/26/2022  4:23 PM      Passed - Valid encounter within last 12 months    Recent Outpatient Visits           1 month ago Upper respiratory tract infection, unspecified type   [facility] Delsa Grana, PA-C   3 months ago Primary hypertension   [facility] Delsa Grana, PA-C   7 months ago Controlled type 2 diabetes mellitus with diabetic nephropathy, without long-term current use of insulin ([place])   [facility] Mecum, Erin E, PA-C   10 months ago Controlled type 2 diabetes mellitus with diabetic nephropathy, without long-term current use of insulin ([place])   [place], PA-C   1 year ago Controlled type 2 diabetes mellitus with diabetic nephropathy, without long-term current use of insulin [facility])   [facility] Delsa Grana, PA-C       Future Appointments             In 2 months Delsa Grana, PA-C [facility]             levocetirizine (XYZAL) 5 MG tablet 90 tablet 3    Sig: Take 1 tablet (5 mg total) by mouth every evening.     Ear, Nose, and Throat:  Antihistamines -  levocetirizine dihydrochloride Failed - 03/26/2022  4:23 PM      Failed - Cr in normal range and within 360 days    Creat  Date Value Ref Range Status  12/04/2021 1.42 (H) 0.70 - 1.28 mg/dL Final   Creatinine, Urine  Date Value Ref Range Status  09/08/2020 417 (H) 20 - 320 mg/dL Final    Comment:    Verified by repeat analysis. .          Passed - eGFR is 10 or above and within 360 days    GFR, Est African American  Date Value Ref Range Status  01/28/2020 60 > OR = 60 mL/min/1.52m Final   GFR, Est Non African American  Date Value Ref Range Status  01/28/2020 51 (L) > OR = 60 mL/min/1.772mFinal   eGFR  Date Value Ref Range Status  12/04/2021 52 (L) > OR = 60 mL/min/1.738minal    Comment:    The eGFR is  based on the CKD-EPI 2021 equation. To calculate  the new eGFR from a previous Creatinine or Cystatin C result, go to https://www.kidney.org/professionals/ kdoqi/gfr%5Fcalculator          Passed - Valid encounter within last 12 months    Recent Outpatient Visits           1 month ago [facility], [facility]   3 months ago [facility] hypertension   [facility]   7 months ago Controlled type 2 diabetes mellitus with diabetic nephropathy, without long-term current use of insulin ([place]CC[place] [facility]erced[facility]-C   10 months ago Controlled type 2 diabetes mellitus with diabetic nephropathy, without long-term current use of insulin ([facility] [facility]   1 year ago Controlled type 2 diabetes mellitus with diabetic nephropathy, without long-term current use of insulin ([facility]CC[place] [facility]       Future Appointments             In 2 months TapDelsa [facility]A-C [facility]             famotidine (PEPCID) 20 MG tablet 180 tablet 1    Sig: Take 1 tablet (20 mg total) by mouth 2 (two) times daily as needed for heartburn or indigestion.     Gastroenterology:  H2 Antagonists Passed - 03/26/2022  4:23 PM      Passed - Valid encounter within last 12 months    Recent Outpatient Visits           1 month ago Upper respiratory tract infection, unspecified type   [facility]   3 months ago Primary hypertension   [facility]   7 months ago Controlled type 2 diabetes mellitus with diabetic nephropathy, without long-term current use of insulin ([place] CHM[facility]A-C   10 months ago Controlled type 2 diabetes mellitus with diabetic  nephropathy, without long-term current use of insulin ([place]CCM[place] [facility]   1 year ago Controlled type 2 diabetes mellitus with diabetic nephropathy, without long-term current use of insulin ([facility]    Future Appointments  In 2 months Tapia, Leisa, PA-C [facility]             glimepiride (AMARYL) 1 MG tablet 180 tablet 0     Endocrinology:  Diabetes - Sulfonylureas Failed - 03/26/2022  4:23 PM      Failed - Cr in normal range and within 360 days    Creat  Date Value Ref Range Status  12/04/2021 1.42 (H) 0.70 - 1.28 mg/dL Final   Creatinine, Urine  Date Value Ref Range Status  09/08/2020 417 (H) 20 - 320 mg/dL Final    Comment:    Verified by repeat analysis. .          Passed - HBA1C is between 0 and 7.9 and within 180 days    Hgb A1c MFr Bld  Date Value Ref Range Status  12/04/2021 7.5 (H) <5.7 % of total Hgb Final    Comment:    For someone without known diabetes, a hemoglobin A1c value of 6.5% or greater indicates that they may have  diabetes and this should be confirmed with a follow-up  test. . For someone with known diabetes, a value <7% indicates  that their diabetes is well controlled and a value  greater than or equal to 7% indicates suboptimal  control. A1c targets should be individualized based on  duration of diabetes, age, comorbid conditions, and  other considerations. . Currently, no consensus exists regarding use of hemoglobin A1c for diagnosis of diabetes for children. .          Passed - Valid encounter within last 6 months    Recent Outpatient Visits           1 month ago Upper respiratory tract infection, unspecified type   [facility] Tapia, Leisa, PA-C   3 months ago Primary hypertension   [facility] Tapia, Leisa, PA-C   7 months ago Controlled type 2 diabetes  mellitus with diabetic nephropathy, without long-term current use of insulin (HCC)   [facility] Mecum, Erin E, PA-C   10 months ago Controlled type 2 diabetes mellitus with diabetic nephropathy, without long-term current use of insulin (HCC)   [facility] Mecum, Erin E, PA-C   1 year ago Controlled type 2 diabetes mellitus with diabetic nephropathy, without long-term current use of insulin (HCC)   [facility] Tapia, Leisa, PA-C       Future Appointments             In 2 months Tapia, Leisa, PA-C [facility]             metFORMIN (GLUCOPHAGE) 1000 MG tablet 180 tablet 0    Sig: Take 1 tablet (1,000 mg total) by mouth 2 (two) times daily with a meal.     Endocrinology:  Diabetes - Biguanides Failed - 03/26/2022  4:23 PM      Failed - Cr in normal range and within 360 days    Creat  Date Value Ref Range Status  12/04/2021 1.42 (H) 0.70 - 1.28 mg/dL Final   Creatinine, Urine  Date Value Ref Range Status  09/08/2020 417 (H) 20 - 320 mg/dL Final    Comment:    Verified by repeat analysis. .          Failed - eGFR in normal range and within 360 days    GFR, Est African American  Date Value Ref Range Status  01/28/2020 60 >   OR = 60 mL/min/1.73m2 Final   GFR, Est Non African American  Date Value Ref Range Status  01/28/2020 51 (L) > OR = 60 mL/min/1.73m2 Final   eGFR  Date Value Ref Range Status  12/04/2021 52 (L) > OR = 60 mL/min/1.73m2 Final    Comment:    The eGFR is based on the CKD-EPI 2021 equation. To calculate  the new eGFR from a previous Creatinine or Cystatin C result, go to https://www.kidney.org/professionals/ kdoqi/gfr%5Fcalculator          Passed - HBA1C is between 0 and 7.9 and within 180 days    Hgb A1c MFr Bld  Date Value Ref Range Status  12/04/2021 7.5 (H) <5.7 % of total Hgb Final    Comment:    For someone without known diabetes, a hemoglobin A1c value of  6.5% or greater indicates that they may have  diabetes and this should be confirmed with a follow-up  test. . For someone with known diabetes, a value <7% indicates  that their diabetes is well controlled and a value  greater than or equal to 7% indicates suboptimal  control. A1c targets should be individualized based on  duration of diabetes, age, comorbid conditions, and  other considerations. . Currently, no consensus exists regarding use of hemoglobin A1c for diagnosis of diabetes for children. .          Passed - B12 Level in normal range and within 720 days    Vitamin B-12  Date Value Ref Range Status  08/29/2021 290 200 - 1,100 pg/mL Final    Comment:    . Please Note: Although the reference range for vitamin B12 is 200-1100 pg/mL, it has been reported that between 5 and 10% of patients with values between 200 and 400 pg/mL may experience neuropsychiatric and hematologic abnormalities due to occult B12 deficiency; less than 1% of patients with values above 400 pg/mL will have symptoms. .          Passed - Valid encounter within last 6 months    Recent Outpatient Visits           1 month ago Upper respiratory tract infection, unspecified type   [facility] Tapia, Leisa, PA-C   3 months ago Primary hypertension   [facility] Tapia, Leisa, PA-C   7 months ago Controlled type 2 diabetes mellitus with diabetic nephropathy, without long-term current use of insulin (HCC)   [facility] Mecum, Erin E, PA-C   10 months ago Controlled type 2 diabetes mellitus with diabetic nephropathy, without long-term current use of insulin (HCC)   [facility] Mecum, Erin E, PA-C   1 year ago Controlled type 2 diabetes mellitus with diabetic nephropathy, without long-term current use of insulin (HCC)   [facility] Tapia, Leisa, PA-C       Future Appointments             In 2 months  Tapia, Leisa, PA-C [facility]            Passed - CBC within normal limits and completed in the last 12 months    WBC  Date Value Ref Range Status  12/04/2021 6.0 3.8 - 10.8 Thousand/uL Final   RBC  Date Value Ref Range Status  12/04/2021 4.47 4.20 - 5.80 Million/uL Final   Hemoglobin  Date Value Ref Range Status  12/04/2021 11.2 (L) 13.2 - 17.1 g/dL Final   HCT    Date Value Ref Range Status  12/04/2021 34.9 (L) 38.5 - 50.0 % Final   MCHC  Date Value Ref Range Status  12/04/2021 32.1 32.0 - 36.0 g/dL Final   [facility]  Date Value Ref Range Status  12/04/2021 25.1 (L) 27.0 - 33.0 pg Final   MCV  Date Value Ref Range Status  12/04/2021 78.1 (L) 80.0 - 100.0 fL Final   No results found for: "PLTCOUNTKUC", "LABPLAT", "POCPLA" RDW  Date Value Ref Range Status  12/04/2021 17.1 (H) 11.0 - 15.0 % Final          FARXIGA 10 MG TABS tablet 30 tablet     Sig: Take 1 tablet (10 mg total) by mouth every morning.     Endocrinology:  Diabetes - SGLT2 Inhibitors Failed - 03/26/2022  4:23 PM      Failed - Cr in normal range and within 360 days    Creat  Date Value Ref Range Status  12/04/2021 1.42 (H) 0.70 - 1.28 mg/dL Final   Creatinine, Urine  Date Value Ref Range Status  09/08/2020 417 (H) 20 - 320 mg/dL Final    Comment:    Verified by repeat analysis. .          Failed - eGFR in normal range and within 360 days    GFR, Est African American  Date Value Ref Range Status  01/28/2020 60 > OR = 60 mL/min/1.63m Final   GFR, Est Non African American  Date Value Ref Range Status  01/28/2020 51 (L) > OR = 60 mL/min/1.740mFinal   eGFR  Date Value Ref Range Status  12/04/2021 52 (L) > OR = 60 mL/min/1.7347minal    Comment:    The eGFR is based on the CKD-EPI 2021 equation. To calculate  the new eGFR from a previous Creatinine or Cystatin C result, go to https://www.kidney.org/professionals/ kdoqi/gfr%5Fcalculator          Passed - HBA1C  is between 0 and 7.9 and within 180 days    Hgb A1c MFr Bld  Date Value Ref Range Status  12/04/2021 7.5 (H) <5.7 % of total Hgb Final    Comment:    For someone without known diabetes, a hemoglobin A1c value of 6.5% or greater indicates that they may have  diabetes and this should be confirmed with a follow-up  test. . For someone with known diabetes, a value <7% indicates  that their diabetes is well controlled and a value  greater than or equal to 7% indicates suboptimal  control. A1c targets should be individualized based on  duration of diabetes, age, comorbid conditions, and  other considerations. . Currently, no consensus exists regarding use of hemoglobin A1c for diagnosis of diabetes for children. .  Renella CunasValid encounter within last 6 months    Recent Outpatient Visits           1 month ago Upper respiratory tract infection, unspecified type   [facility]   3 months ago Primary hypertension   [facility]   7 months ago Controlled type 2 diabetes mellitus with diabetic nephropathy, without long-term current use of insulin ([facility]   10 months ago Controlled type 2 diabetes mellitus with diabetic nephropathy, without long-term current use of insulin ([facility] EriDani GobbleA-C   1 year  ago Controlled type 2 diabetes mellitus with diabetic nephropathy, without long-term current use of insulin (HCC)   [facility] Tapia, Leisa, PA-C       Future Appointments             In 2 months Tapia, Leisa, PA-C [facility]              

## 2022-04-15 LAB — MICROALBUMIN / CREATININE URINE RATIO: Microalb Creat Ratio: 12

## 2022-06-06 ENCOUNTER — Encounter: Payer: Self-pay | Admitting: Family Medicine

## 2022-06-06 ENCOUNTER — Ambulatory Visit (INDEPENDENT_AMBULATORY_CARE_PROVIDER_SITE_OTHER): Payer: Medicare Other | Admitting: Family Medicine

## 2022-06-06 VITALS — BP 148/82 | HR 95 | Temp 98.2°F | Resp 16 | Ht 71.0 in | Wt 189.5 lb

## 2022-06-06 DIAGNOSIS — E785 Hyperlipidemia, unspecified: Secondary | ICD-10-CM

## 2022-06-06 DIAGNOSIS — I10 Essential (primary) hypertension: Secondary | ICD-10-CM | POA: Diagnosis not present

## 2022-06-06 DIAGNOSIS — Z23 Encounter for immunization: Secondary | ICD-10-CM

## 2022-06-06 DIAGNOSIS — K2100 Gastro-esophageal reflux disease with esophagitis, without bleeding: Secondary | ICD-10-CM

## 2022-06-06 DIAGNOSIS — E1121 Type 2 diabetes mellitus with diabetic nephropathy: Secondary | ICD-10-CM | POA: Diagnosis not present

## 2022-06-06 MED ORDER — AMLODIPINE BESYLATE 5 MG PO TABS: 5.0000 mg | ORAL_TABLET | Freq: Every day | ORAL | 1 refills | Status: DC

## 2022-06-06 MED ORDER — FARXIGA 10 MG PO TABS: 10.0000 mg | ORAL_TABLET | Freq: Every morning | ORAL | 11 refills | Status: DC

## 2022-06-06 NOTE — Progress Notes (Signed)
Name: Paul Spears   MRN: [id number]    DOB: [birth year]   Date:06/06/2022       Progress Note  Chief Complaint  Patient presents with   Follow-up   Hypertension   Hyperlipidemia   Diabetes     Subjective:   Paul Spears is a 76 y.o. male, presents to clinic for f/up on routine conditions  HTN = on amlodipine 2.5  BP high today  BP Readings from Last 3 Encounters:  06/06/22 (!) 148/82  12/04/21 138/82  08/29/21 132/74   He does follow with nephrology CKD stage 3 - on farxiga but currently cannot afford the meds was $47/month now $180 Last labs with nephro per care everywhere gfr 55, microalb/creat ration 12, PTH mildly elevated  Uncontrolled dm Reports good med compliance On glipizide and farxiga - but out of farxiga due to increased cost Pt has no SE from meds. Blood sugars - not checking Denies: Polyuria, polydipsia, vision changes, neuropathy, hypoglycemia Recent pertinent labs: Lab Results  Component Value Date   HGBA1C 7.5 (H) 12/04/2021   HGBA1C 7.9 (H) 08/29/2021   HGBA1C 7.6 (A) 05/30/2021   Lab Results  Component Value Date   MICROALBUR 0.8 12/04/2021   LDLCALC 50 05/30/2021   CREATININE 1.42 (H) 12/04/2021   Standard of care and health maintenance: Urine Microalbumin:  done per nephro - need to abstract to our EMR Foot exam:  UTD DM eye exam:  need to request - due ACEI/ARB:  no - noted allergy to losartan Statin:  yes  Hld on atorvastatin 80 and zetia No SE or concerns, denies myalgias Last lipids 06/07/2021 - due for recheck - LFTs reveiwed from last labs and through care everywhere    Current Outpatient Medications:    albuterol (PROAIR HFA) 108 (90 Base) MCG/ACT inhaler, Inhale 2 puffs into the lungs every 4 (four) hours as needed for wheezing or shortness of breath., Disp: 18 g, Rfl: 2   atorvastatin (LIPITOR) 80 MG tablet, Take 1 tablet (80 mg total) by mouth daily. At bedtime, Disp: 90 tablet, Rfl: 3   benzonatate (TESSALON) 100 MG  capsule, Take 1-2 capsules (100-200 mg total) by mouth 3 (three) times daily as needed for cough., Disp: 30 capsule, Rfl: 1   Cholecalciferol (VITAMIN D3 PO), Take 1 capsule by mouth daily., Disp: , Rfl:    ezetimibe (ZETIA) 10 MG tablet, Take 1 tablet (10 mg total) by mouth daily., Disp: 90 tablet, Rfl: 3   famotidine (PEPCID) 20 MG tablet, Take 1 tablet (20 mg total) by mouth 2 (two) times daily as needed for heartburn or indigestion., Disp: 180 tablet, Rfl: 1   fluticasone (FLONASE) 50 MCG/ACT nasal spray, Place 2 sprays into both nostrils daily as needed., Disp: 16 g, Rfl: 11   glimepiride (AMARYL) 1 MG tablet, TAKE 1 TABLET BY MOUTH TWICE DAILY WITH MEALS AS NEEDED -  HOLD  IF  BLOOD  SUGARS  ARE  LESS  THAN  100  OR  IF  NOT  EATING, Disp: 180 tablet, Rfl: 0   glucose blood (ACCU-CHEK AVIVA PLUS) test strip, USE 1 STRIP TO CHECK GLUCOSE ONCE DAILY, Disp: 100 each, Rfl: 0   Lancets (ACCU-CHEK MULTICLIX) lancets, USE AS DIRECTED, Disp: 102 each, Rfl: 12   levocetirizine (XYZAL) 5 MG tablet, Take 1 tablet (5 mg total) by mouth every evening., Disp: 90 tablet, Rfl: 3   metFORMIN (GLUCOPHAGE) 1000 MG tablet, Take 1 tablet (1,000 mg total) by mouth 2 (two)  times daily with a meal., Disp: 180 tablet, Rfl: 0   pantoprazole (PROTONIX) 40 MG tablet, Take 1 tablet (40 mg total) by mouth daily., Disp: 90 tablet, Rfl: 3   amLODipine (NORVASC) 5 MG tablet, Take 1 tablet (5 mg total) by mouth daily. For blood pressure, Disp: 90 tablet, Rfl: 1   FARXIGA 10 MG TABS tablet, Take 1 tablet (10 mg total) by mouth every morning., Disp: 30 tablet, Rfl: 11  Patient Active Problem List   Diagnosis Date Noted   Pain in joint of left knee 12/13/2021   Arthritis of left knee 12/13/2021   Sprain of MCL (medial collateral ligament) of knee 12/13/2021   Secondary hyperparathyroidism of renal origin ([place]) 12/04/2021   Reactive airway disease without complication 93/81/8299   Current moderate episode of major depressive  disorder, unspecified whether recurrent ([place]) 12/04/2021   Pain of left forearm 09/12/2021   Potential for cognitive impairment 05/30/2021   At risk for depressed mood 05/30/2021   Secondary hyperparathyroidism of renal origin ([place]) 03/07/2021   Acquired trigger finger of right ring finger 09/24/2019   Anemia in chronic kidney disease 08/23/2019   Essential (hemorrhagic) thrombocythemia ([place])    Iron deficiency anemia 11/11/2017   DDD (degenerative disc disease), cervical 12/07/2015   GERD with esophagitis 09/21/2014   Barrett esophagus 09/21/2014   Cervical osteoarthritis 09/21/2014   Chronic kidney disease (CKD), stage III (moderate) ([employer]) 09/21/2014   Controlled type 2 diabetes mellitus with diabetic nephropathy ([place]) 09/21/2014   Microalbuminuria 09/21/2014   Osteopenia 09/21/2014   Stage 3a chronic kidney disease ([place]) 09/21/2014   HTN (hypertension) 05/20/2014   Hyperlipidemia 05/20/2014   Benign hypertensive kidney disease with chronic kidney disease 05/20/2014   Abnormal respiratory rate 05/20/2014   Benign hypertensive kidney disease with chronic kidney disease 05/20/2014   Essential hypertension 05/20/2014    Past Surgical History:  Procedure Laterality Date   CARPAL TUNNEL RELEASE Right    COLONOSCOPY  10/26/2012   repeat in 10 years MUS   COLONOSCOPY WITH PROPOFOL N/A 02/08/2019   Procedure: COLONOSCOPY WITH PROPOFOL;  Surgeon: Lollie Sails, MD;  Location: [facility] ENDOSCOPY;  Service: Endoscopy;  Laterality: N/A;   ESOPHAGOGASTRODUODENOSCOPY (EGD) WITH PROPOFOL N/A 10/22/2016   Procedure: ESOPHAGOGASTRODUODENOSCOPY (EGD) WITH PROPOFOL;  Surgeon: Lollie Sails, MD;  Location: [facility] ENDOSCOPY;  Service: Endoscopy;  Laterality: N/A;   ESOPHAGOGASTRODUODENOSCOPY (EGD) WITH PROPOFOL N/A 07/14/2018   Procedure: ESOPHAGOGASTRODUODENOSCOPY (EGD) WITH PROPOFOL;  Surgeon: Lucilla Lame, MD;  [facility] ENDOSCOPY;  Service: Endoscopy;  Laterality: N/A;   FRACTURE SURGERY  Left    arm   SHOULDER ARTHROSCOPY WITH SUBACROMIAL DECOMPRESSION AND BICEP TENDON REPAIR Right 04/16/2018   Procedure: ARTHROSCOPIC  BICEP TENOTOMY;  Surgeon: Thornton Park, MD;  Location: [facility] ORS;  Service: Orthopedics;  Laterality: Right;   UPPER ESOPHAGEAL ENDOSCOPIC ULTRASOUND (EUS)  6/15 ue to repeat in 1 year    Family History  Problem Relation Age of Onset   Hypertension Father    Heart attack Father    Heart disease Sister    Hypertension Sister     Social History   Tobacco Use   Smoking status: Never   Smokeless tobacco: Never  Vaping Use   Vaping Use: Never used  Substance Use Topics   Alcohol use: No   Drug use: No     Allergies  Allergen Reactions   Losartan Potassium Other (See Comments)    hyperkalemia    Health Maintenance  Topic Date Due   Medicare Annual  Wellness (AWV)  06/21/2020   OPHTHALMOLOGY EXAM  10/19/2021   COVID-19 Vaccine (4 - 2023-24 season) 06/22/2022 (Originally 02/15/2022)   Zoster Vaccines- Shingrix (1 of 2) 09/05/2022 (Originally 01/09/1965)   Diabetic kidney evaluation - eGFR measurement  12/05/2022   FOOT EXAM  12/05/2022   HEMOGLOBIN A1C  12/06/2022   Diabetic kidney evaluation - Urine ACR  04/16/2023   DTaP/Tdap/Td (2 - Td or Tdap) 04/03/2026   Pneumonia Vaccine 33+ Years old  Completed   INFLUENZA VACCINE  Completed   Hepatitis C Screening  Completed   HPV VACCINES  Aged Out   COLONOSCOPY (Pts 45-18yr Insurance coverage will need to be confirmed)  Discontinued    Chart Review Today: I personally reviewed active problem list, medication list, allergies, family history, social history, health maintenance, notes from last encounter, lab results, imaging with the patient/caregiver today.   Review of Systems  Constitutional: Negative.   HENT: Negative.    Eyes: Negative.   Respiratory: Negative.    Cardiovascular: Negative.   Gastrointestinal: Negative.   Endocrine: Negative.   Genitourinary: Negative.    Musculoskeletal: Negative.   Skin: Negative.   Allergic/Immunologic: Negative.   Neurological: Negative.   Hematological: Negative.   Psychiatric/Behavioral: Negative.    All other systems reviewed and are negative.    Objective:   Vitals:   06/06/22 0840 06/06/22 0846  BP: (!) 162/84 (!) 148/82  Pulse: 95   Resp: 16   Temp: 98.2 F (36.8 C)   TempSrc: Oral   SpO2: 99%   Weight: 189 lb 8 oz (86 kg)   Height: _0  (1.803 m)     Body mass index is 26.43 kg/m.  Physical Exam Vitals and nursing note reviewed.  Constitutional:      General: He is not in acute distress.    Appearance: Normal appearance. He is well-developed and normal weight. He is not ill-appearing, toxic-appearing or diaphoretic.     Interventions: Face mask in place.  HENT:     Head: Normocephalic and atraumatic.     Jaw: No trismus.     Right Ear: External ear normal.     Left Ear: External ear normal.     Mouth/Throat:     Mouth: Mucous membranes are moist.     Pharynx: Oropharynx is clear.  Eyes:     General: Lids are normal. No scleral icterus.       Right eye: No discharge.        Left eye: No discharge.     Conjunctiva/sclera: Conjunctivae normal.  Neck:     Trachea: Trachea and phonation normal. No tracheal deviation.  Cardiovascular:     Rate and Rhythm: Normal rate and regular rhythm.     Pulses: Normal pulses.          Radial pulses are 2+ on the right side and 2+ on the left side.       Posterior tibial pulses are 2+ on the right side and 2+ on the left side.     Heart sounds: Normal heart sounds. No murmur heard.    No friction rub. No gallop.  Pulmonary:     Effort: Pulmonary effort is normal. No respiratory distress.     Breath sounds: Normal breath sounds. No stridor. No wheezing, rhonchi or rales.  Abdominal:     General: Bowel sounds are normal. There is no distension.     Palpations: Abdomen is soft.  Musculoskeletal:     Right lower leg: No edema.  Left lower leg:  No edema.  Skin:    General: Skin is warm and dry.     Coloration: Skin is not jaundiced or pale (his baseline).     Findings: No rash.     Nails: There is no clubbing.  Neurological:     Mental Status: He is alert. Mental status is at baseline.     Cranial Nerves: No dysarthria or facial asymmetry.     Motor: No tremor or abnormal muscle tone.     Gait: Gait normal.  Psychiatric:        Mood and Affect: Mood normal.        Speech: Speech normal.        Behavior: Behavior normal. Behavior is cooperative.         Assessment & Plan:   Problem List Items Addressed This Visit       Cardiovascular and Mediastinum   HTN (hypertension) - Primary    Uncontrolled today BP Readings from Last 3 Encounters:  06/06/22 (!) 148/82  12/04/21 138/82  08/29/21 132/74  Increase amlodipine to 5 mg daily Being out of farxiga may also be contributing to slightly higher BP       Relevant Medications   amLODipine (NORVASC) 5 MG tablet     Digestive   GERD with esophagitis    Stable sx, denies any Gi upset, blood in stool, melena, change in bowels, on long term PPI and pepcid prn (due to barrett's esophagus - following with GI)        Endocrine   Controlled type 2 diabetes mellitus with diabetic nephropathy ([place])    Has been controlled for age recently, but out of one of his medications - farxiga Not checking CBGs On glipizide and will not do other meds, we have discussed risk of hypoglycemia - he continues to deny any sx or episodes Due for A1C recheck Lab Results  Component Value Date   HGBA1C 7.5 (H) 12/04/2021   HGBA1C 7.9 (H) 08/29/2021   HGBA1C 7.6 (A) 05/30/2021  Farxiga sample and coupon given to pt Need to get DM eye exam Abstract urine albumin/creatinine ration for nephro records Not on acei/arb due to prior hyperkalemia On statin      Relevant Medications   FARXIGA 10 MG TABS tablet   Other Relevant Orders   Hemoglobin A1c (Completed)   Microalbumin / creatinine  urine ratio (Completed)     Other   Hyperlipidemia    On statin, good compliance no SE or concerns, due for lipids On lipitor 80 mg daily and zetia 10 mg daily      Other Relevant Orders   Lipid panel (Completed)   Other Visit Diagnoses     Need for influenza vaccination       Relevant Orders   Flu Vaccine QUAD High Dose(Fluad) (Completed)        Return in about 3 months (around 09/05/2022).   Delsa Grana, PA-C 06/06/22 2:12 PM

## 2022-06-07 LAB — HEMOGLOBIN A1C
Hgb A1c MFr Bld: 8.2 % of total Hgb — ABNORMAL HIGH (ref <5.7)
Mean Plasma Glucose: 189 mg/dL
eAG (mmol/L): 10.4 mmol/L

## 2022-06-07 LAB — LIPID PANEL
Cholesterol: 105 mg/dL (ref <200)
HDL: 38 mg/dL — ABNORMAL LOW (ref >=40)
LDL Cholesterol (Calc): 48 mg/dL (calc)
Non-HDL Cholesterol (Calc): 67 mg/dL (calc) (ref <130)
Total CHOL/HDL Ratio: 2.8 (calc) (ref <5.0)
Triglycerides: 102 mg/dL (ref <150)

## 2022-06-12 ENCOUNTER — Encounter: Payer: Self-pay | Admitting: Family Medicine

## 2022-06-12 NOTE — Assessment & Plan Note (Signed)
On statin, good compliance no SE or concerns, due for lipids

## 2022-06-12 NOTE — Assessment & Plan Note (Signed)
Has been controlled for age recently, but out of one of his medications - farxiga Not checking CBGs On glipizide and will not do other meds, we have discussed risk of hypoglycemia - he continues to deny any sx or episodes Due for A1C recheck Lab Results  Component Value Date   HGBA1C 7.5 (H) 12/04/2021   HGBA1C 7.9 (H) 08/29/2021   HGBA1C 7.6 (A) 05/30/2021  Farxiga sample and coupon given to pt Need to get DM eye exam Abstract urine albumin/creatinine ration for nephro records Not on acei/arb due to prior hyperkalemia On statin

## 2022-06-12 NOTE — Assessment & Plan Note (Signed)
Uncontrolled today BP Readings from Last 3 Encounters:  06/06/22 (!) 148/82  12/04/21 138/82  08/29/21 132/74  Increase amlodipine to 5 mg daily

## 2022-06-12 NOTE — Assessment & Plan Note (Signed)
Stable sx, denies any Gi upset, blood in stool, melena, change in bowels, on long term PPI and pepcid prn (due to barrett's esophagus - following with GI)

## 2022-06-14 ENCOUNTER — Ambulatory Visit: Payer: Medicare Other | Admitting: Registered Nurse

## 2022-06-14 ENCOUNTER — Ambulatory Visit
Admission: RE | Admit: 2022-06-14 | Discharge: 2022-06-14 | Disposition: A | Discharge status: Home / Self Care | Payer: Medicare Other | Attending: Gastroenterology | Admitting: Gastroenterology

## 2022-06-14 ENCOUNTER — Encounter
Admission: RE | Disposition: A | Discharge status: Home / Self Care | Payer: Self-pay | Source: Home / Self Care | Attending: Gastroenterology

## 2022-06-14 ENCOUNTER — Encounter: Payer: Self-pay | Admitting: *Deleted

## 2022-06-14 DIAGNOSIS — K219 Gastro-esophageal reflux disease without esophagitis: Secondary | ICD-10-CM | POA: Insufficient documentation

## 2022-06-14 DIAGNOSIS — E785 Hyperlipidemia, unspecified: Secondary | ICD-10-CM | POA: Insufficient documentation

## 2022-06-14 DIAGNOSIS — K641 Second degree hemorrhoids: Secondary | ICD-10-CM | POA: Diagnosis not present

## 2022-06-14 DIAGNOSIS — N183 Chronic kidney disease, stage 3 unspecified: Secondary | ICD-10-CM | POA: Insufficient documentation

## 2022-06-14 DIAGNOSIS — I129 Hypertensive chronic kidney disease with stage 1 through stage 4 chronic kidney disease, or unspecified chronic kidney disease: Secondary | ICD-10-CM | POA: Diagnosis not present

## 2022-06-14 DIAGNOSIS — Z79899 Other long term (current) drug therapy: Secondary | ICD-10-CM | POA: Diagnosis not present

## 2022-06-14 DIAGNOSIS — D509 Iron deficiency anemia, unspecified: Secondary | ICD-10-CM | POA: Diagnosis present

## 2022-06-14 DIAGNOSIS — E1122 Type 2 diabetes mellitus with diabetic chronic kidney disease: Secondary | ICD-10-CM | POA: Insufficient documentation

## 2022-06-14 DIAGNOSIS — K2970 Gastritis, unspecified, without bleeding: Secondary | ICD-10-CM | POA: Diagnosis not present

## 2022-06-14 DIAGNOSIS — D123 Benign neoplasm of transverse colon: Secondary | ICD-10-CM | POA: Insufficient documentation

## 2022-06-14 DIAGNOSIS — Z7984 Long term (current) use of oral hypoglycemic drugs: Secondary | ICD-10-CM | POA: Insufficient documentation

## 2022-06-14 HISTORY — PX: COLONOSCOPY WITH PROPOFOL: SHX5780

## 2022-06-14 HISTORY — PX: ESOPHAGOGASTRODUODENOSCOPY (EGD) WITH PROPOFOL: SHX5813

## 2022-06-14 LAB — GLUCOSE, CAPILLARY: Glucose-Capillary: 157 mg/dL — ABNORMAL HIGH (ref 70–99)

## 2022-06-14 SURGERY — COLONOSCOPY WITH PROPOFOL
Anesthesia: General

## 2022-06-14 MED ORDER — LIDOCAINE HCL (CARDIAC) PF 100 MG/5ML IV SOSY
PREFILLED_SYRINGE | INTRAVENOUS | Status: DC | PRN
  Administered 2022-06-14: 100 mg via INTRAVENOUS

## 2022-06-14 MED ORDER — PROPOFOL 10 MG/ML IV BOLUS
INTRAVENOUS | Status: AC
  Filled 2022-06-14: qty 20

## 2022-06-14 MED ORDER — PROPOFOL 1000 MG/100ML IV EMUL
INTRAVENOUS | Status: AC
  Filled 2022-06-14: qty 100

## 2022-06-14 MED ORDER — PROPOFOL 500 MG/50ML IV EMUL
INTRAVENOUS | Status: DC | PRN
  Administered 2022-06-14: 199.92 ug/kg/min via INTRAVENOUS

## 2022-06-14 MED ORDER — SODIUM CHLORIDE 0.9 % IV SOLN
INTRAVENOUS | Status: DC
  Administered 2022-06-14: 1000 mL via INTRAVENOUS

## 2022-06-14 MED ORDER — PROPOFOL 10 MG/ML IV BOLUS
INTRAVENOUS | Status: DC | PRN
  Administered 2022-06-14: 80 mg via INTRAVENOUS

## 2022-06-14 MED ORDER — STERILE WATER FOR IRRIGATION IR SOLN
Status: DC | PRN
  Administered 2022-06-14: 120 mL

## 2022-06-14 NOTE — H&P (Signed)
Outpatient short stay form Pre-procedure 06/14/2022  Paul Rubenstein, MD  Primary Physician: Delsa Grana, PA-C  Reason for visit:  IDA  History of present illness:    76 y/o gentleman with history of hypertension, HLD, and DM II here for EGD/Colonoscopy for IDA. No blood thinners. No family history of GI malignancies. No significant abdominal surgeries. History of BE.    Current Facility-Administered Medications:    0.9 %  sodium chloride infusion, , Intravenous, Continuous, Aleksandra Raben, Hilton Cork, MD, Last Rate: 20 mL/hr at 06/14/22 1157, 1,000 mL at 06/14/22 1157  Medications Prior to Admission  Medication Sig Dispense Refill Last Dose   albuterol (PROAIR HFA) 108 (90 Base) MCG/ACT inhaler Inhale 2 puffs into the lungs every 4 (four) hours as needed for wheezing or shortness of breath. 18 g 2 06/14/2022 at 0800   amLODipine (NORVASC) 5 MG tablet Take 1 tablet (5 mg total) by mouth daily. For blood pressure 90 tablet 1 Past Week   atorvastatin (LIPITOR) 80 MG tablet Take 1 tablet (80 mg total) by mouth daily. At bedtime 90 tablet 3 Past Week   Cholecalciferol (VITAMIN D3 PO) Take 1 capsule by mouth daily.   Past Week   ezetimibe (ZETIA) 10 MG tablet Take 1 tablet (10 mg total) by mouth daily. 90 tablet 3 Past Week   famotidine (PEPCID) 20 MG tablet Take 1 tablet (20 mg total) by mouth 2 (two) times daily as needed for heartburn or indigestion. 180 tablet 1 Past Week   FARXIGA 10 MG TABS tablet Take 1 tablet (10 mg total) by mouth every morning. 30 tablet 11 Past Week   fluticasone (FLONASE) 50 MCG/ACT nasal spray Place 2 sprays into both nostrils daily as needed. 16 g 11 06/13/2022   glimepiride (AMARYL) 1 MG tablet TAKE 1 TABLET BY MOUTH TWICE DAILY WITH MEALS AS NEEDED -  HOLD  IF  BLOOD  SUGARS  ARE  LESS  THAN  100  OR  IF  NOT  EATING 180 tablet 0 Past Week   glucose blood (ACCU-CHEK AVIVA PLUS) test strip USE 1 STRIP TO CHECK GLUCOSE ONCE DAILY 100 each 0 06/13/2022   Lancets  (ACCU-CHEK MULTICLIX) lancets USE AS DIRECTED 102 each 12 06/13/2022   metFORMIN (GLUCOPHAGE) 1000 MG tablet Take 1 tablet (1,000 mg total) by mouth 2 (two) times daily with a meal. 180 tablet 0 Past Week   pantoprazole (PROTONIX) 40 MG tablet Take 1 tablet (40 mg total) by mouth daily. 90 tablet 3 Past Week   benzonatate (TESSALON) 100 MG capsule Take 1-2 capsules (100-200 mg total) by mouth 3 (three) times daily as needed for cough. 30 capsule 1  at prn   levocetirizine (XYZAL) 5 MG tablet Take 1 tablet (5 mg total) by mouth every evening. 90 tablet 3  at prn     Allergies  Allergen Reactions   Losartan Potassium Other (See Comments)    hyperkalemia     Past Medical History:  Diagnosis Date   Allergy    Barrett's esophagus    Diabetes mellitus without complication (HCC)    Esophagitis    LA grade C   Failure of erection 09/21/2014   GERD (gastroesophageal reflux disease)    Hyperlipidemia    Hypertension    Iron deficiency anemia 11/11/2017   Kidney disease, chronic, stage III (GFR 30-59 ml/min) (HCC)     Review of systems:  Otherwise negative.    Physical Exam  Gen: Alert, oriented. Appears stated age.  HEENT:  PERRLA. Lungs: No respiratory distress CV: RRR Abd: soft, benign, no masses Ext: No edema    Planned procedures: Proceed with EGD/colonoscopy. The patient understands the nature of the planned procedure, indications, risks, alternatives and potential complications including but not limited to bleeding, infection, perforation, damage to internal organs and possible oversedation/side effects from anesthesia. The patient agrees and gives consent to proceed.  Please refer to procedure notes for findings, recommendations and patient disposition/instructions.     Paul Rubenstein, MD [facility] Gastroenterology

## 2022-06-14 NOTE — Op Note (Signed)
[facility] Gastroenterology Patient Name: Paul Spears Procedure Date: 06/14/2022 11:13 AM MRN: [id number] Account #: [id number] Date of Birth: [birth year] Admit Type: Outpatient Age: 76 Room: [facility] ENDO ROOM 3 Gender: Male Note Status: Finalized Instrument Name: Upper Endoscope [id number] Procedure:             Upper GI endoscopy Indications:           Iron deficiency anemia Providers:             Andrey Farmer MD, MD Medicines:             Monitored Anesthesia Care Complications:         No immediate complications. Estimated blood loss:                         Minimal. Procedure:             Pre-Anesthesia Assessment:                        - Prior to the procedure, a History and Physical was                         performed, and patient medications and allergies were                         reviewed. The patient is competent. The risks and                         benefits of the procedure and the sedation options and                         risks were discussed with the patient. All questions                         were answered and informed consent was obtained.                         Patient identification and proposed procedure were                         verified by the physician, the nurse, the                         anesthesiologist, the anesthetist and the technician                         in the endoscopy suite. Mental Status Examination:                         alert and oriented. Airway Examination: normal                         oropharyngeal airway and neck mobility. Respiratory                         Examination: clear to auscultation. CV Examination:                         normal. Prophylactic Antibiotics: The patient does not  require prophylactic antibiotics. Prior                         Anticoagulants: The patient has taken no anticoagulant                         or antiplatelet agents. ASA Grade Assessment: III  - A                         patient with severe systemic disease. After reviewing                         the risks and benefits, the patient was deemed in                         satisfactory condition to undergo the procedure. The                         anesthesia plan was to use monitored anesthesia care                         (MAC). Immediately prior to administration of                         medications, the patient was re-assessed for adequacy                         to receive sedatives. The heart rate, respiratory                         rate, oxygen saturations, blood pressure, adequacy of                         pulmonary ventilation, and response to care were                         monitored throughout the procedure. The physical                         status of the patient was re-assessed after the                         procedure.                        After obtaining informed consent, the endoscope was                         passed under direct vision. Throughout the procedure,                         the patient's blood pressure, pulse, and oxygen                         saturations were monitored continuously. The Endoscope                         was introduced through the mouth, and advanced to the  second part of duodenum. The upper GI endoscopy was                         accomplished without difficulty. The patient tolerated                         the procedure well. Findings:      The examined esophagus was normal.      Patchy minimal inflammation characterized by erythema was found in the       gastric antrum. Biopsies were taken with a cold forceps for Helicobacter       pylori testing. Estimated blood loss was minimal.      The examined duodenum was normal. Impression:            - Normal esophagus.                        - Gastritis. Biopsied.                        - Normal examined duodenum. Recommendation:        - Discharge  patient to home.                        - Resume previous diet.                        - Continue present medications.                        - Await pathology results.                        - Return to referring physician as previously                         scheduled. Procedure Code(s):     --- Professional ---                        [phone], Esophagogastroduodenoscopy, flexible,                         transoral; with biopsy, single or multiple Diagnosis Code(s):     --- Professional ---                        K29.70, Gastritis, unspecified, without bleeding                        D50.9, Iron deficiency anemia, unspecified CPT copyright 2022 American [employer] Association. All rights reserved. The codes documented in this report are preliminary and upon coder review may  be revised to meet current compliance requirements. Andrey Farmer MD, MD 06/14/2022 12:45:33 PM Number of Addenda: 0 Note Initiated On: 06/14/2022 11:13 AM Estimated Blood Loss:  Estimated blood loss was minimal.      [facility]

## 2022-06-14 NOTE — Op Note (Signed)
[facility] Gastroenterology Patient Name: Paul Spears Procedure Date: 06/14/2022 11:13 AM MRN: [id number] Account #: [id number] Date of Birth: 01/22/1946 Admit Type: Outpatient Age: 76 Room: [facility] ENDO ROOM 3 Gender: Male Note Status: Finalized Instrument Name: Colonoscope [id number] Procedure:             Colonoscopy Indications:           Iron deficiency anemia Providers:             Andrey Farmer MD, MD Medicines:             Monitored Anesthesia Care Complications:         No immediate complications. Estimated blood loss:                         Minimal. Procedure:             Pre-Anesthesia Assessment:                        - Prior to the procedure, a History and Physical was                         performed, and patient medications and allergies were                         reviewed. The patient is competent. The risks and                         benefits of the procedure and the sedation options and                         risks were discussed with the patient. All questions                         were answered and informed consent was obtained.                         Patient identification and proposed procedure were                         verified by the physician, the nurse, the                         anesthesiologist, the anesthetist and the technician                         in the endoscopy suite. Mental Status Examination:                         alert and oriented. Airway Examination: normal                         oropharyngeal airway and neck mobility. Respiratory                         Examination: clear to auscultation. CV Examination:                         normal. Prophylactic Antibiotics: The patient does not  require prophylactic antibiotics. Prior                         Anticoagulants: The patient has taken no anticoagulant                         or antiplatelet agents. ASA Grade Assessment: III - A                          patient with severe systemic disease. After reviewing                         the risks and benefits, the patient was deemed in                         satisfactory condition to undergo the procedure. The                         anesthesia plan was to use monitored anesthesia care                         (MAC). Immediately prior to administration of                         medications, the patient was re-assessed for adequacy                         to receive sedatives. The heart rate, respiratory                         rate, oxygen saturations, blood pressure, adequacy of                         pulmonary ventilation, and response to care were                         monitored throughout the procedure. The physical                         status of the patient was re-assessed after the                         procedure.                        After obtaining informed consent, the colonoscope was                         passed under direct vision. Throughout the procedure,                         the patient's blood pressure, pulse, and oxygen                         saturations were monitored continuously. The                         Colonoscope was introduced through the anus and  advanced to the the terminal ileum. The colonoscopy                         was performed without difficulty. The patient                         tolerated the procedure well. The quality of the bowel                         preparation was good. The terminal ileum, ileocecal                         valve, appendiceal orifice, and rectum were                         photographed. Findings:      The perianal and digital rectal examinations were normal.      The terminal ileum appeared normal.      Two sessile polyps were found in the proximal transverse colon. The       polyps were 1 to 2 mm in size. These polyps were removed with a jumbo       cold forceps. Resection  and retrieval were complete. Estimated blood       loss was minimal.      Internal hemorrhoids were found during retroflexion. The hemorrhoids       were Grade II (internal hemorrhoids that prolapse but reduce       spontaneously).      The exam was otherwise without abnormality on direct and retroflexion       views. Impression:            - The examined portion of the ileum was normal.                        - Two 1 to 2 mm polyps in the proximal transverse                         colon, removed with a jumbo cold forceps. Resected and                         retrieved.                        - Internal hemorrhoids.                        - The examination was otherwise normal on direct and                         retroflexion views. Recommendation:        - Discharge patient to home.                        - Resume previous diet.                        - Continue present medications.                        - Await pathology results.                        -  Repeat colonoscopy is not recommended due to current                         age (47 years or older) for surveillance.                        - Return to referring physician as previously                         scheduled. Procedure Code(s):     --- Professional ---                        [phone], Colonoscopy, flexible; with biopsy, single or                         multiple Diagnosis Code(s):     --- Professional ---                        K64.1, Second degree hemorrhoids                        D12.3, Benign neoplasm of transverse colon (hepatic                         flexure or splenic flexure)                        D50.9, Iron deficiency anemia, unspecified CPT copyright 2022 American Medical Association. All rights reserved. The codes documented in this report are preliminary and upon coder review may  be revised to meet current compliance requirements. Andrey Farmer MD, MD 06/14/2022 12:47:50 PM Number of Addenda:  0 Note Initiated On: 06/14/2022 11:13 AM Scope Withdrawal Time: 0 hours 11 minutes 14 seconds  Total Procedure Duration: 0 hours 15 minutes 6 seconds  Estimated Blood Loss:  Estimated blood loss was minimal.      [facility]

## 2022-06-14 NOTE — Anesthesia Preprocedure Evaluation (Signed)
Anesthesia Evaluation  Patient identified by MRN, date of birth, ID band Patient awake    Reviewed: Allergy & Precautions, NPO status , Patient's Chart, lab work & pertinent test results  Airway Mallampati: II  TM Distance: >3 FB Neck ROM: full    Dental  (+) Missing,    Pulmonary neg pulmonary ROS   Pulmonary exam normal breath sounds clear to auscultation       Cardiovascular Exercise Tolerance: Good hypertension, Pt. on medications negative cardio ROS Normal cardiovascular exam Rhythm:Regular     Neuro/Psych    Depression    negative neurological ROS  negative psych ROS   GI/Hepatic negative GI ROS, Neg liver ROS,GERD  Medicated,,  Endo/Other  negative endocrine ROSdiabetes, Well Controlled, Type 2, Oral Hypoglycemic Agents    Renal/GU negative Renal ROS  negative genitourinary   Musculoskeletal  (+) Arthritis ,    Abdominal Normal abdominal exam  (+)   Peds negative pediatric ROS (+)  Hematology negative hematology ROS (+) Blood dyscrasia, anemia   Anesthesia Other Findings Past Medical History: No date: Allergy No date: Barrett's esophagus No date: Diabetes mellitus without complication (HCC) No date: Esophagitis     Comment:  LA grade C 09/21/2014: Failure of erection No date: GERD (gastroesophageal reflux disease) No date: Hyperlipidemia No date: Hypertension 11/11/2017: Iron deficiency anemia No date: Kidney disease, chronic, stage III (GFR 30-59 ml/min) ([place])  Past Surgical History: No date: CARPAL TUNNEL RELEASE; Right 10/26/2012: COLONOSCOPY     Comment:  repeat in 10 years MUS 02/08/2019: COLONOSCOPY WITH PROPOFOL; N/A     Comment:  Procedure: COLONOSCOPY WITH PROPOFOL;  Surgeon:               Lollie Sails, MD;  Location: [facility] ENDOSCOPY;                Service: Endoscopy;  Laterality: N/A; 10/22/2016: ESOPHAGOGASTRODUODENOSCOPY (EGD) WITH PROPOFOL; N/A     Comment:  Procedure:  ESOPHAGOGASTRODUODENOSCOPY (EGD) WITH               PROPOFOL;  Surgeon: Lollie Sails, MD;  Location:               [facility] ENDOSCOPY;  Service: Endoscopy;  Laterality: N/A; 07/14/2018: ESOPHAGOGASTRODUODENOSCOPY (EGD) WITH PROPOFOL; N/A     Comment:  Procedure: ESOPHAGOGASTRODUODENOSCOPY (EGD) WITH               PROPOFOL;  Surgeon: Lucilla Lame, MD;  Location: [facility]               ENDOSCOPY;  Service: Endoscopy;  Laterality: N/A; No date: FRACTURE SURGERY; Left     Comment:  arm 04/16/2018: SHOULDER ARTHROSCOPY WITH SUBACROMIAL DECOMPRESSION AND  BICEP TENDON REPAIR; Right     Comment:  Procedure: ARTHROSCOPIC  BICEP TENOTOMY;  Surgeon:               Thornton Park, MD;  Location: [facility] ORS;  Service:               Orthopedics;  Laterality: Right; 6/15 ue to repeat in 1 year: UPPER ESOPHAGEAL ENDOSCOPIC ULTRASOUND  (EUS)  BMI    Body Mass Index: 25.58 kg/m      Reproductive/Obstetrics negative OB ROS                             Anesthesia Physical Anesthesia Plan  ASA: 3  Anesthesia Plan: General   Post-op Pain Management:  Induction:   PONV Risk Score and Plan: Propofol infusion and TIVA  Airway Management Planned: Natural Airway  Additional Equipment:   Intra-op Plan:   Post-operative Plan:   Informed Consent: I have reviewed the patients History and Physical, chart, labs and discussed the procedure including the risks, benefits and alternatives for the proposed anesthesia with the patient or authorized representative who has indicated his/her understanding and acceptance.     Dental Advisory Given  Plan Discussed with: CRNA and Surgeon  Anesthesia Plan Comments:        Anesthesia Quick Evaluation

## 2022-06-14 NOTE — Interval H&P Note (Signed)
History and Physical Interval Note:  06/14/2022 12:07 PM  Paul Spears  has presented today for surgery, with the diagnosis of RECTAL BLEEDING, BARRETT'S ESOPHAGUS,IDA.  The various methods of treatment have been discussed with the patient and family. After consideration of risks, benefits and other options for treatment, the patient has consented to  Procedure(s): COLONOSCOPY WITH PROPOFOL (N/A) ESOPHAGOGASTRODUODENOSCOPY (EGD) WITH PROPOFOL (N/A) as a surgical intervention.  The patient's history has been reviewed, patient examined, no change in status, stable for surgery.  I have reviewed the patient's chart and labs.  Questions were answered to the patient's satisfaction.     Lesly Rubenstein  Ok to proceed with EGD/Colonoscopy

## 2022-06-14 NOTE — Anesthesia Postprocedure Evaluation (Signed)
Anesthesia Post Note  Patient: Paul Spears  Procedure(s) Performed: COLONOSCOPY WITH PROPOFOL ESOPHAGOGASTRODUODENOSCOPY (EGD) WITH PROPOFOL  Patient location during evaluation: PACU Anesthesia Type: General Level of consciousness: awake and awake and alert Pain management: pain level controlled Vital Signs Assessment: post-procedure vital signs reviewed and stable Respiratory status: spontaneous breathing Cardiovascular status: stable Anesthetic complications: no  No notable events documented.   Last Vitals:  Vitals:   06/14/22 1139 06/14/22 1243  BP: (!) 149/93 91/62  Pulse: (!) 116   Resp: 18   Temp: (!) 36.2 C 36.8 C  SpO2: 100%     Last Pain:  Vitals:   06/14/22 1243  TempSrc: Temporal  PainSc: Asleep                 VAN STAVEREN,Davine Sweney

## 2022-06-14 NOTE — Transfer of Care (Signed)
Immediate Anesthesia Transfer of Care Note  Patient: Paul Spears  Procedure(s) Performed: COLONOSCOPY WITH PROPOFOL ESOPHAGOGASTRODUODENOSCOPY (EGD) WITH PROPOFOL  Patient Location: Endoscopy Unit  Anesthesia Type:General  Level of Consciousness: drowsy  Airway & Oxygen Therapy: Patient Spontanous Breathing  Post-op Assessment: Report given to RN and Post -op Vital signs reviewed and stable  Post vital signs: Reviewed and stable  Last Vitals:  Vitals Value Taken Time  BP 132/117 06/14/22 1243  Temp    Pulse 107 06/14/22 1242  Resp 14 06/14/22 1243  SpO2 95 % 06/14/22 1242  Vitals shown include unvalidated device data.  Last Pain:  Vitals:   06/14/22 1139  TempSrc: Temporal  PainSc: 0-No pain         Complications: No notable events documented.

## 2022-06-15 ENCOUNTER — Encounter: Payer: Self-pay | Admitting: Gastroenterology

## 2022-06-18 LAB — SURGICAL PATHOLOGY

## 2022-06-24 ENCOUNTER — Other Ambulatory Visit: Payer: Self-pay | Admitting: Family Medicine

## 2022-06-24 DIAGNOSIS — E1121 Type 2 diabetes mellitus with diabetic nephropathy: Secondary | ICD-10-CM

## 2022-09-05 ENCOUNTER — Ambulatory Visit: Payer: Medicare Other | Admitting: Family Medicine

## 2022-09-06 ENCOUNTER — Ambulatory Visit (INDEPENDENT_AMBULATORY_CARE_PROVIDER_SITE_OTHER): Payer: Medicare Other | Admitting: Family Medicine

## 2022-09-06 VITALS — BP 146/84 | HR 96 | Temp 97.7°F | Resp 16 | Ht 71.0 in | Wt 178.8 lb

## 2022-09-06 DIAGNOSIS — E1121 Type 2 diabetes mellitus with diabetic nephropathy: Secondary | ICD-10-CM | POA: Diagnosis not present

## 2022-09-06 DIAGNOSIS — J45909 Unspecified asthma, uncomplicated: Secondary | ICD-10-CM | POA: Diagnosis not present

## 2022-09-06 DIAGNOSIS — K2100 Gastro-esophageal reflux disease with esophagitis, without bleeding: Secondary | ICD-10-CM

## 2022-09-06 DIAGNOSIS — F321 Major depressive disorder, single episode, moderate: Secondary | ICD-10-CM

## 2022-09-06 DIAGNOSIS — N2581 Secondary hyperparathyroidism of renal origin: Secondary | ICD-10-CM

## 2022-09-06 DIAGNOSIS — N1831 Chronic kidney disease, stage 3a: Secondary | ICD-10-CM | POA: Diagnosis not present

## 2022-09-06 DIAGNOSIS — R002 Palpitations: Secondary | ICD-10-CM | POA: Diagnosis not present

## 2022-09-06 DIAGNOSIS — D473 Essential (hemorrhagic) thrombocythemia: Secondary | ICD-10-CM | POA: Diagnosis not present

## 2022-09-06 DIAGNOSIS — I10 Essential (primary) hypertension: Secondary | ICD-10-CM

## 2022-09-06 DIAGNOSIS — R0789 Other chest pain: Secondary | ICD-10-CM

## 2022-09-06 DIAGNOSIS — Z5181 Encounter for therapeutic drug level monitoring: Secondary | ICD-10-CM

## 2022-09-06 DIAGNOSIS — D631 Anemia in chronic kidney disease: Secondary | ICD-10-CM | POA: Diagnosis not present

## 2022-09-06 NOTE — Progress Notes (Signed)
Name: Paul Spears   MRN: [id number]    DOB: 1946/01/22   Date:09/06/2022       Progress Note  Chief Complaint  Patient presents with  . Follow-up  . Diabetes  . Gastroesophageal Reflux  . Hypertension     Subjective:   Paul Spears is a 77 y.o. male, presents to clinic for f/up on DM  Lab Results  Component Value Date   HGBA1C 8.2 (H) 06/06/2022  A1C increased and is uncontrolled Ptis on    ***   Current Outpatient Medications:  .  albuterol (PROAIR HFA) 108 (90 Base) MCG/ACT inhaler, Inhale 2 puffs into the lungs every 4 (four) hours as needed for wheezing or shortness of breath., Disp: 18 g, Rfl: 2 .  amLODipine (NORVASC) 5 MG tablet, Take 1 tablet (5 mg total) by mouth daily. For blood pressure, Disp: 90 tablet, Rfl: 1 .  atorvastatin (LIPITOR) 80 MG tablet, Take 1 tablet (80 mg total) by mouth daily. At bedtime, Disp: 90 tablet, Rfl: 3 .  benzonatate (TESSALON) 100 MG capsule, Take 1-2 capsules (100-200 mg total) by mouth 3 (three) times daily as needed for cough., Disp: 30 capsule, Rfl: 1 .  Cholecalciferol (VITAMIN D3 PO), Take 1 capsule by mouth daily., Disp: , Rfl:  .  ezetimibe (ZETIA) 10 MG tablet, Take 1 tablet (10 mg total) by mouth daily., Disp: 90 tablet, Rfl: 3 .  famotidine (PEPCID) 20 MG tablet, Take 1 tablet (20 mg total) by mouth 2 (two) times daily as needed for heartburn or indigestion., Disp: 180 tablet, Rfl: 1 .  FARXIGA 10 MG TABS tablet, Take 1 tablet (10 mg total) by mouth every morning., Disp: 30 tablet, Rfl: 11 .  fluticasone (FLONASE) 50 MCG/ACT nasal spray, Place 2 sprays into both nostrils daily as needed., Disp: 16 g, Rfl: 11 .  glimepiride (AMARYL) 1 MG tablet, TAKE 1 TABLET BY MOUTH TWICE DAILY WITH MEALS AS NEEDED - HOLD IF BLOOD SUGARS ARE LESS THAN 100 OR IF NOT EATING, Disp: 180 tablet, Rfl: 0 .  glucose blood (ACCU-CHEK AVIVA PLUS) test strip, USE 1 STRIP TO CHECK GLUCOSE ONCE DAILY, Disp: 100 each, Rfl: 0 .  Lancets (ACCU-CHEK  MULTICLIX) lancets, USE AS DIRECTED, Disp: 102 each, Rfl: 12 .  levocetirizine (XYZAL) 5 MG tablet, Take 1 tablet (5 mg total) by mouth every evening., Disp: 90 tablet, Rfl: 3 .  metFORMIN (GLUCOPHAGE) 1000 MG tablet, TAKE 1 TABLET (1,000 MG TOTAL) BY MOUTH TWICE A DAY WITH FOOD, Disp: 180 tablet, Rfl: 0 .  pantoprazole (PROTONIX) 40 MG tablet, Take 1 tablet (40 mg total) by mouth daily., Disp: 90 tablet, Rfl: 3  Patient Active Problem List   Diagnosis Date Noted  . Pain in joint of left knee 12/13/2021  . Arthritis of left knee 12/13/2021  . Sprain of MCL (medial collateral ligament) of knee 12/13/2021  . Secondary hyperparathyroidism of renal origin ([place]) 12/04/2021  . Reactive airway disease without complication [id number]  . Current moderate episode of major depressive disorder, unspecified whether recurrent ([place]) 12/04/2021  . Pain of left forearm 09/12/2021  . Potential for cognitive impairment 05/30/2021  . At risk for depressed mood 05/30/2021  . Secondary hyperparathyroidism of renal origin ([place]) 03/07/2021  . Acquired trigger finger of right ring finger 09/24/2019  . Anemia in chronic kidney disease 08/23/2019  . Essential (hemorrhagic) thrombocythemia ([place])   . Iron deficiency anemia 11/11/2017  . DDD (degenerative disc disease), cervical 12/07/2015  . GERD with  esophagitis 09/21/2014  . Barrett esophagus 09/21/2014  . Cervical osteoarthritis 09/21/2014  . Chronic kidney disease (CKD), stage III (moderate) ([place]) 09/21/2014  . Controlled type 2 diabetes mellitus with diabetic nephropathy ([place]) 09/21/2014  . Microalbuminuria 09/21/2014  . Osteopenia 09/21/2014  . Stage 3a chronic kidney disease ([place]) 09/21/2014  . HTN (hypertension) 05/20/2014  . Hyperlipidemia 05/20/2014  . Benign hypertensive kidney disease with chronic kidney disease 05/20/2014  . Abnormal respiratory rate 05/20/2014  . Benign hypertensive kidney disease with chronic kidney disease 05/20/2014  .  Essential hypertension 05/20/2014    Past Surgical History:  Procedure Laterality Date  . CARPAL TUNNEL RELEASE Right   . COLONOSCOPY  10/26/2012   repeat in 10 years MUS  . COLONOSCOPY WITH PROPOFOL N/A 02/08/2019   Procedure: COLONOSCOPY WITH PROPOFOL;  Surgeon: Lollie Sails, MD;  Location: [facility] ENDOSCOPY;  Service: Endoscopy;  Laterality: N/A;  . COLONOSCOPY WITH PROPOFOL N/A 06/14/2022   Procedure: COLONOSCOPY WITH PROPOFOL;  Surgeon: Lesly Rubenstein, MD;  Location: [facility] ENDOSCOPY;  Service: Endoscopy;  Laterality: N/A;  . ESOPHAGOGASTRODUODENOSCOPY (EGD) WITH PROPOFOL N/A 10/22/2016   Procedure: ESOPHAGOGASTRODUODENOSCOPY (EGD) WITH PROPOFOL;  Surgeon: Lollie Sails, MD;  Location: [facility] ENDOSCOPY;  Service: Endoscopy;  Laterality: N/A;  . ESOPHAGOGASTRODUODENOSCOPY (EGD) WITH PROPOFOL N/A 07/14/2018   Procedure: ESOPHAGOGASTRODUODENOSCOPY (EGD) WITH PROPOFOL;  Surgeon: Lucilla Lame, MD;  Location: [facility] ENDOSCOPY;  Service: Endoscopy;  Laterality: N/A;  . ESOPHAGOGASTRODUODENOSCOPY (EGD) WITH PROPOFOL N/A 06/14/2022   Procedure: ESOPHAGOGASTRODUODENOSCOPY (EGD) WITH PROPOFOL;  Surgeon: Lesly Rubenstein, MD;  Location: [facility] ENDOSCOPY;  Service: Endoscopy;  Laterality: N/A;  . FRACTURE SURGERY Left    arm  . SHOULDER ARTHROSCOPY WITH SUBACROMIAL DECOMPRESSION AND BICEP TENDON REPAIR Right 04/16/2018   Procedure: ARTHROSCOPIC  BICEP TENOTOMY;  Surgeon: Thornton Park, MD;  Location: [facility] ORS;  Service: Orthopedics;  Laterality: Right;  . UPPER ESOPHAGEAL ENDOSCOPIC ULTRASOUND (EUS)  6/15 ue to repeat in 1 year    Family History  Problem Relation Age of Onset  . Hypertension Father   . Heart attack Father   . Heart disease Sister   . Hypertension Sister     Social History   Tobacco Use  . Smoking status: Never  . Smokeless tobacco: Never  Vaping Use  . Vaping Use: Never used  Substance Use Topics  . Alcohol use: No  . Drug use: No     Allergies  Allergen  Reactions  . Losartan Potassium Other (See Comments)    hyperkalemia    Health Maintenance  Topic Date Due  . Medicare Annual Wellness (AWV)  06/21/2020  . OPHTHALMOLOGY EXAM  10/19/2021  . COVID-19 Vaccine (4 - 2023-24 season) 09/22/2022 (Originally 02/15/2022)  . Zoster Vaccines- Shingrix (1 of 2) 12/07/2022 (Originally [birth year]/1965)  . Diabetic kidney evaluation - eGFR measurement  12/05/2022  . FOOT EXAM  12/05/2022  . HEMOGLOBIN A1C  12/06/2022  . Diabetic kidney evaluation - Urine ACR  04/16/2023  . DTaP/Tdap/Td (2 - Td or Tdap) 04/03/2026  . Pneumonia Vaccine 39+ Years old  Completed  . INFLUENZA VACCINE  Completed  . Hepatitis C Screening  Completed  . HPV VACCINES  Aged Out  . COLONOSCOPY (Pts 45-11yrs Insurance coverage will need to be confirmed)  Discontinued    Chart Review Today: ***  Review of Systems   Objective:   Vitals:   09/06/22 0823  BP: (!) 142/80  Pulse: 96  Resp: 16  Temp: 97.7 F (36.5 C)  TempSrc: Oral  SpO2: 99%  Weight: 178 lb 12.8 oz (81.1 kg)  Height: 5\' 11"  (1.803 m)    Body mass index is 24.94 kg/m.  Physical Exam      Assessment & Plan:   Problem List Items Addressed This Visit       Cardiovascular and Mediastinum   HTN (hypertension) - Primary     Digestive   GERD with esophagitis     Endocrine   Controlled type 2 diabetes mellitus with diabetic nephropathy ([place])     No follow-ups on file.   Delsa Grana, PA-C 09/06/22 8:35 AM

## 2022-09-07 LAB — CBC WITH DIFFERENTIAL/PLATELET
Absolute Monocytes: 572 cells/uL (ref 200–950)
Basophils Absolute: 83 cells/uL (ref 0–200)
Basophils Relative: 1.6 %
Eosinophils Absolute: 244 cells/uL (ref 15–500)
Eosinophils Relative: 4.7 %
HCT: 37.4 % — ABNORMAL LOW (ref 38.5–50.0)
Hemoglobin: 11.8 g/dL — ABNORMAL LOW (ref 13.2–17.1)
Lymphs Abs: 1513 cells/uL (ref 850–3900)
MCH: 26.6 pg — ABNORMAL LOW (ref 27.0–33.0)
MCHC: 31.6 g/dL — ABNORMAL LOW (ref 32.0–36.0)
MCV: 84.2 fL (ref 80.0–100.0)
MPV: 10.4 fL (ref 7.5–12.5)
Monocytes Relative: 11 %
Neutro Abs: 2787 cells/uL (ref 1500–7800)
Neutrophils Relative %: 53.6 %
Platelets: 350 10*3/uL (ref 140–400)
RBC: 4.44 10*6/uL (ref 4.20–5.80)
RDW: 16.6 % — ABNORMAL HIGH (ref 11.0–15.0)
Total Lymphocyte: 29.1 %
WBC: 5.2 10*3/uL (ref 3.8–10.8)

## 2022-09-07 LAB — COMPLETE METABOLIC PANEL WITH GFR
AG Ratio: 1.7 (calc) (ref 1.0–2.5)
ALT: 8 U/L — ABNORMAL LOW (ref 9–46)
AST: 14 U/L (ref 10–35)
Albumin: 4.3 g/dL (ref 3.6–5.1)
Alkaline phosphatase (APISO): 82 U/L (ref 35–144)
BUN: 11 mg/dL (ref 7–25)
CO2: 22 mmol/L (ref 20–32)
Calcium: 9.7 mg/dL (ref 8.6–10.3)
Chloride: 108 mmol/L (ref 98–110)
Creat: 1.13 mg/dL (ref 0.70–1.28)
Globulin: 2.5 g/dL (calc) (ref 1.9–3.7)
Glucose, Bld: 116 mg/dL — ABNORMAL HIGH (ref 65–99)
Potassium: 5 mmol/L (ref 3.5–5.3)
Sodium: 139 mmol/L (ref 135–146)
Total Bilirubin: 0.8 mg/dL (ref 0.2–1.2)
Total Protein: 6.8 g/dL (ref 6.1–8.1)
eGFR: 67 mL/min/{1.73_m2} (ref >=60)

## 2022-09-07 LAB — HEMOGLOBIN A1C
Hgb A1c MFr Bld: 7.5 % of total Hgb — ABNORMAL HIGH (ref <5.7)
Mean Plasma Glucose: 169 mg/dL
eAG (mmol/L): 9.3 mmol/L

## 2022-09-11 ENCOUNTER — Other Ambulatory Visit: Payer: Self-pay | Admitting: Family Medicine

## 2022-09-11 DIAGNOSIS — E1121 Type 2 diabetes mellitus with diabetic nephropathy: Secondary | ICD-10-CM

## 2022-09-11 DIAGNOSIS — R0789 Other chest pain: Secondary | ICD-10-CM | POA: Insufficient documentation

## 2022-09-11 MED ORDER — GLIMEPIRIDE 1 MG PO TABS: ORAL_TABLET | ORAL | 0 refills | Status: DC

## 2022-09-11 MED ORDER — METFORMIN HCL 1000 MG PO TABS: ORAL_TABLET | ORAL | 1 refills | Status: DC

## 2022-09-11 NOTE — Assessment & Plan Note (Signed)
Stable thrombocytosis - monitoring 

## 2022-09-11 NOTE — Assessment & Plan Note (Signed)
Hx of barretts - on long term PPI, sx stable, well controlled on protonix

## 2022-09-11 NOTE — Assessment & Plan Note (Signed)
We increased amlodipine after last OV, BP still elevated here today, not much improved with recheck May need to take this opportunity to add ACEI - previously he did not tolerate ARB BP Readings from Last 3 Encounters:  09/06/22 (!) 146/84  06/14/22 91/62  06/06/22 (!) 148/82  Continue to work on diet/lifestyle efforts

## 2022-09-11 NOTE — Assessment & Plan Note (Addendum)
Managed with rescue inhaler and controlling allergies Lungs CTA

## 2022-09-11 NOTE — Assessment & Plan Note (Signed)
Back on his medications, recently A1C was uncontrolled He has also been working on diet On farxiga, metformin and glipizide On statin, but not ACEI/ARB - could retry with monitoring potassium Due for recheck of labs due to being uncontrolled Lab Results  Component Value Date   HGBA1C 8.2 (H) 06/06/2022

## 2022-09-11 NOTE — Assessment & Plan Note (Signed)
Chronic, pt not on meds    09/06/2022    8:20 AM 06/06/2022    8:30 AM 01/28/2022   11:14 AM  Depression screen PHQ 2/9  Decreased Interest 0 2 0  Down, Depressed, Hopeless 0 3 0  PHQ - 2 Score 0 5 0  Altered sleeping 0 1 0  Tired, decreased energy 0 1 1  Change in appetite 0 0 0  Feeling bad or failure about yourself  0 0 0  Trouble concentrating 0 0 0  Moving slowly or fidgety/restless 0 0 0  Suicidal thoughts 0 0 0  PHQ-9 Score 0 7 1  Difficult doing work/chores Not difficult at all Not difficult at all Not difficult at all  Phq 9 reviewed and negative

## 2022-09-11 NOTE — Assessment & Plan Note (Signed)
Per nephrology 

## 2022-09-11 NOTE — Assessment & Plan Note (Signed)
Pt managed by nephrology, last OV and labs reviewed, we will recheck renal function

## 2022-09-25 ENCOUNTER — Other Ambulatory Visit: Payer: Self-pay | Admitting: Family Medicine

## 2022-09-26 ENCOUNTER — Other Ambulatory Visit: Payer: Self-pay | Admitting: Family Medicine

## 2022-09-26 DIAGNOSIS — E1121 Type 2 diabetes mellitus with diabetic nephropathy: Secondary | ICD-10-CM

## 2022-10-07 DIAGNOSIS — E1122 Type 2 diabetes mellitus with diabetic chronic kidney disease: Secondary | ICD-10-CM | POA: Diagnosis not present

## 2022-10-07 DIAGNOSIS — I129 Hypertensive chronic kidney disease with stage 1 through stage 4 chronic kidney disease, or unspecified chronic kidney disease: Secondary | ICD-10-CM | POA: Diagnosis not present

## 2022-10-07 DIAGNOSIS — N2581 Secondary hyperparathyroidism of renal origin: Secondary | ICD-10-CM | POA: Diagnosis not present

## 2022-10-07 DIAGNOSIS — N1831 Chronic kidney disease, stage 3a: Secondary | ICD-10-CM | POA: Diagnosis not present

## 2022-10-07 DIAGNOSIS — I10 Essential (primary) hypertension: Secondary | ICD-10-CM | POA: Diagnosis not present

## 2022-10-14 DIAGNOSIS — D631 Anemia in chronic kidney disease: Secondary | ICD-10-CM | POA: Diagnosis not present

## 2022-10-14 DIAGNOSIS — E1122 Type 2 diabetes mellitus with diabetic chronic kidney disease: Secondary | ICD-10-CM | POA: Diagnosis not present

## 2022-10-14 DIAGNOSIS — I10 Essential (primary) hypertension: Secondary | ICD-10-CM | POA: Diagnosis not present

## 2022-10-14 DIAGNOSIS — N2581 Secondary hyperparathyroidism of renal origin: Secondary | ICD-10-CM | POA: Diagnosis not present

## 2022-10-14 DIAGNOSIS — N182 Chronic kidney disease, stage 2 (mild): Secondary | ICD-10-CM | POA: Diagnosis not present

## 2022-11-05 ENCOUNTER — Other Ambulatory Visit: Payer: Self-pay | Admitting: Family Medicine

## 2022-11-05 DIAGNOSIS — K2100 Gastro-esophageal reflux disease with esophagitis, without bleeding: Secondary | ICD-10-CM

## 2022-12-02 ENCOUNTER — Ambulatory Visit (INDEPENDENT_AMBULATORY_CARE_PROVIDER_SITE_OTHER): Payer: Medicare Other | Admitting: Family Medicine

## 2022-12-02 ENCOUNTER — Encounter: Payer: Self-pay | Admitting: Family Medicine

## 2022-12-02 VITALS — BP 124/76 | HR 88 | Temp 97.6°F | Resp 16 | Ht 71.0 in | Wt 186.6 lb

## 2022-12-02 DIAGNOSIS — E1165 Type 2 diabetes mellitus with hyperglycemia: Secondary | ICD-10-CM | POA: Diagnosis not present

## 2022-12-02 DIAGNOSIS — N1831 Chronic kidney disease, stage 3a: Secondary | ICD-10-CM | POA: Diagnosis not present

## 2022-12-02 DIAGNOSIS — F430 Acute stress reaction: Secondary | ICD-10-CM

## 2022-12-02 DIAGNOSIS — Z7984 Long term (current) use of oral hypoglycemic drugs: Secondary | ICD-10-CM | POA: Diagnosis not present

## 2022-12-02 DIAGNOSIS — I10 Essential (primary) hypertension: Secondary | ICD-10-CM

## 2022-12-02 DIAGNOSIS — E1121 Type 2 diabetes mellitus with diabetic nephropathy: Secondary | ICD-10-CM

## 2022-12-02 MED ORDER — AMLODIPINE BESYLATE 5 MG PO TABS: 5.0000 mg | ORAL_TABLET | Freq: Every day | ORAL | 1 refills | Status: DC

## 2022-12-02 MED ORDER — GLIMEPIRIDE 1 MG PO TABS: ORAL_TABLET | ORAL | 1 refills | Status: DC
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
## 2022-12-02 NOTE — Patient Instructions (Addendum)
Return after June 22 to get your A1C checked (Monday June 24- Friday June 28) 8-11:30 or 1:30-3:30

## 2022-12-05 ENCOUNTER — Encounter: Payer: Self-pay | Admitting: Family Medicine

## 2022-12-06 ENCOUNTER — Ambulatory Visit: Payer: Medicare Other | Admitting: Family Medicine

## 2022-12-13 DIAGNOSIS — E1165 Type 2 diabetes mellitus with hyperglycemia: Secondary | ICD-10-CM | POA: Diagnosis not present

## 2022-12-14 LAB — HEMOGLOBIN A1C
Hgb A1c MFr Bld: 7.7 % of total Hgb — ABNORMAL HIGH (ref <5.7)
Mean Plasma Glucose: 174 mg/dL
eAG (mmol/L): 9.7 mmol/L

## 2022-12-20 ENCOUNTER — Other Ambulatory Visit: Payer: Self-pay | Admitting: Family Medicine

## 2022-12-20 DIAGNOSIS — E1165 Type 2 diabetes mellitus with hyperglycemia: Secondary | ICD-10-CM

## 2022-12-20 MED ORDER — GLIMEPIRIDE 1 MG PO TABS: 1.0000 mg | ORAL_TABLET | Freq: Two times a day (BID) | ORAL | 1 refills | Status: DC

## 2022-12-28 ENCOUNTER — Other Ambulatory Visit: Payer: Self-pay | Admitting: Family Medicine

## 2022-12-28 DIAGNOSIS — E1165 Type 2 diabetes mellitus with hyperglycemia: Secondary | ICD-10-CM

## 2023-02-03 DIAGNOSIS — S86911A Strain of unspecified muscle(s) and tendon(s) at lower leg level, right leg, initial encounter: Secondary | ICD-10-CM | POA: Diagnosis not present

## 2023-03-23 ENCOUNTER — Other Ambulatory Visit: Payer: Self-pay | Admitting: Family Medicine

## 2023-03-24 ENCOUNTER — Other Ambulatory Visit: Payer: Self-pay | Admitting: Family Medicine

## 2023-03-24 DIAGNOSIS — K2100 Gastro-esophageal reflux disease with esophagitis, without bleeding: Secondary | ICD-10-CM

## 2023-03-24 DIAGNOSIS — E782 Mixed hyperlipidemia: Secondary | ICD-10-CM

## 2023-03-31 ENCOUNTER — Other Ambulatory Visit: Payer: Self-pay | Admitting: Family Medicine

## 2023-04-02 ENCOUNTER — Encounter: Payer: Self-pay | Admitting: Physician Assistant
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
## 2023-04-03 ENCOUNTER — Ambulatory Visit (INDEPENDENT_AMBULATORY_CARE_PROVIDER_SITE_OTHER): Payer: Medicare Other | Admitting: Physician Assistant

## 2023-04-03 ENCOUNTER — Encounter: Payer: Self-pay | Admitting: Physician Assistant

## 2023-04-03 VITALS — BP 134/76 | HR 103 | Temp 97.9°F | Resp 16 | Ht 71.0 in | Wt 191.4 lb

## 2023-04-03 DIAGNOSIS — I10 Essential (primary) hypertension: Secondary | ICD-10-CM

## 2023-04-03 DIAGNOSIS — E1165 Type 2 diabetes mellitus with hyperglycemia: Secondary | ICD-10-CM | POA: Diagnosis not present

## 2023-04-03 DIAGNOSIS — E1121 Type 2 diabetes mellitus with diabetic nephropathy: Secondary | ICD-10-CM | POA: Diagnosis not present

## 2023-04-03 DIAGNOSIS — F321 Major depressive disorder, single episode, moderate: Secondary | ICD-10-CM

## 2023-04-03 DIAGNOSIS — I129 Hypertensive chronic kidney disease with stage 1 through stage 4 chronic kidney disease, or unspecified chronic kidney disease: Secondary | ICD-10-CM | POA: Diagnosis not present

## 2023-04-03 DIAGNOSIS — E785 Hyperlipidemia, unspecified: Secondary | ICD-10-CM

## 2023-04-03 DIAGNOSIS — Z7984 Long term (current) use of oral hypoglycemic drugs: Secondary | ICD-10-CM | POA: Diagnosis not present

## 2023-04-03 MED ORDER — ACCU-CHEK AVIVA PLUS VI STRP: ORAL_STRIP | 3 refills | Status: DC

## 2023-04-03 NOTE — Assessment & Plan Note (Signed)
Chronic, historic condition Appears well managed with current regimen comprised of Atorvastatin, zetia  Recheck lipid panel today for monitoring  Results to dictate further management  Continue current regimen for now Follow up in 6 months or sooner if concerns arise

## 2023-04-03 NOTE — Assessment & Plan Note (Signed)
Chronic, historic condition Most recent A1C was 7.7  Recheck today  Results to dictate further management  Continue current regimen of Metformin 1000 mg PO BID, Glimepiride  1 mg PO BID, Farxiga 10 mg PO every day  He reports he is not always taking second doses of Metformin and glimepiride - reviewed importance of regimen adherence for optimal control  Offered pill packing to aid with adherence but he declined this  Follow up in 3 months or sooner if concerns arise

## 2023-04-03 NOTE — Assessment & Plan Note (Signed)
Chronic, historic condition Appears well managed on current regimen comprised of amlodipine 5 mg POQD Continue current regimen Recommend checking BP at home for monitoring Follow up in 3 months or sooner if concerns arise

## 2023-04-03 NOTE — Assessment & Plan Note (Signed)
Chronic, ongoing Recheck CMP for monitoring  Currently taking Farxiga 10 mg PO every day  Allergy to Losartan - would prefer to add ACEi or ARB for management tolerable May need to address this at later visit  Continue current regimen for now- avoid NSAIDs and stay well hydrated Follow up in 6 months or sooner if concerns arise

## 2023-04-03 NOTE — Patient Instructions (Addendum)
I recommend increasing your daily fiber intake- do this gradually to prevent bloating and abdominal discomfort. Try to get about 5 grams per day the first week or so then increase by 5 grams each week until you feel like you are having comfortable bowel movements  Make sure you are eating 3 meals per day and having snacks - this can sometimes help with constipation and making your bowel movements more regular

## 2023-04-03 NOTE — Assessment & Plan Note (Signed)
Patient reports ongoing depression for the past year PHQ9 reviewed today  Offered to start SSRI/SNRI but he is not amenable to medication at this time  Recheck mood and discuss symptom management at next follow up or sooner if concerns arise

## 2023-04-04 ENCOUNTER — Other Ambulatory Visit: Payer: Self-pay | Admitting: Family Medicine

## 2023-04-04 DIAGNOSIS — K2100 Gastro-esophageal reflux disease with esophagitis, without bleeding: Secondary | ICD-10-CM

## 2023-04-04 LAB — HEMOGLOBIN A1C
Hgb A1c MFr Bld: 8.7 %{Hb} — ABNORMAL HIGH (ref <5.7)
Mean Plasma Glucose: 203 mg/dL
eAG (mmol/L): 11.2 mmol/L

## 2023-04-04 LAB — CBC WITH DIFFERENTIAL/PLATELET
Absolute Lymphocytes: 1632 {cells}/uL (ref 850–3900)
Absolute Monocytes: 655 {cells}/uL (ref 200–950)
Basophils Absolute: 113 {cells}/uL (ref 0–200)
Basophils Relative: 1.8 %
Eosinophils Absolute: 265 {cells}/uL (ref 15–500)
Eosinophils Relative: 4.2 %
HCT: 36.1 % — ABNORMAL LOW (ref 38.5–50.0)
Hemoglobin: 10.9 g/dL — ABNORMAL LOW (ref 13.2–17.1)
MCH: 23.9 pg — ABNORMAL LOW (ref 27.0–33.0)
MCHC: 30.2 g/dL — ABNORMAL LOW (ref 32.0–36.0)
MCV: 79 fL — ABNORMAL LOW (ref 80.0–100.0)
MPV: 10.8 fL (ref 7.5–12.5)
Monocytes Relative: 10.4 %
Neutro Abs: 3635 {cells}/uL (ref 1500–7800)
Neutrophils Relative %: 57.7 %
Platelets: 361 10*3/uL (ref 140–400)
RBC: 4.57 10*6/uL (ref 4.20–5.80)
RDW: 15.9 % — ABNORMAL HIGH (ref 11.0–15.0)
Total Lymphocyte: 25.9 %
WBC: 6.3 10*3/uL (ref 3.8–10.8)

## 2023-04-04 LAB — LIPID PANEL
Cholesterol: 98 mg/dL (ref <200)
HDL: 39 mg/dL — ABNORMAL LOW (ref >=40)
LDL Cholesterol (Calc): 40 mg/dL
Non-HDL Cholesterol (Calc): 59 mg/dL (ref <130)
Total CHOL/HDL Ratio: 2.5 (calc) (ref <5.0)
Triglycerides: 103 mg/dL (ref <150)

## 2023-04-04 LAB — COMPLETE METABOLIC PANEL WITH GFR
AG Ratio: 2 (calc) (ref 1.0–2.5)
ALT: 10 U/L (ref 9–46)
AST: 15 U/L (ref 10–35)
Albumin: 4.5 g/dL (ref 3.6–5.1)
Alkaline phosphatase (APISO): 99 U/L (ref 35–144)
BUN/Creatinine Ratio: 13 (calc) (ref 6–22)
BUN: 19 mg/dL (ref 7–25)
CO2: 21 mmol/L (ref 20–32)
Calcium: 9.4 mg/dL (ref 8.6–10.3)
Chloride: 106 mmol/L (ref 98–110)
Creat: 1.42 mg/dL — ABNORMAL HIGH (ref 0.70–1.28)
Globulin: 2.3 g/dL (ref 1.9–3.7)
Glucose, Bld: 111 mg/dL — ABNORMAL HIGH (ref 65–99)
Potassium: 4.7 mmol/L (ref 3.5–5.3)
Sodium: 140 mmol/L (ref 135–146)
Total Bilirubin: 0.8 mg/dL (ref 0.2–1.2)
Total Protein: 6.8 g/dL (ref 6.1–8.1)
eGFR: 51 mL/min/{1.73_m2} — ABNORMAL LOW (ref >=60)

## 2023-04-04 NOTE — Progress Notes (Signed)
Your labs are back Your electrolytes, liver and kidney function appear to be stable Your CBC shows mild anemia.  I recommend follow-up testing in the office to see if you are low in iron or other vitamin/minerals. Your cholesterol looks great Your A1c has increased to 8.7.  This will likely improve with better adherence to your medication regimen.  Please remember to take your metformin and glimepiride twice a day.  If this is a challenge for you we might need to discuss other medications or add on other agents for better control Please make sure that you are reducing excess sugar intake and exercising regularly throughout the week Please let us know if you have questions or concerns

## 2023-04-09 ENCOUNTER — Other Ambulatory Visit: Payer: Self-pay

## 2023-04-09 DIAGNOSIS — K2100 Gastro-esophageal reflux disease with esophagitis, without bleeding: Secondary | ICD-10-CM

## 2023-04-09 DIAGNOSIS — E782 Mixed hyperlipidemia: Secondary | ICD-10-CM

## 2023-04-09 MED ORDER — METFORMIN HCL 1000 MG PO TABS: ORAL_TABLET | ORAL | 0 refills | Status: DC

## 2023-04-09 MED ORDER — PANTOPRAZOLE SODIUM 40 MG PO TBEC: 40.0000 mg | DELAYED_RELEASE_TABLET | Freq: Every day | ORAL | 0 refills | Status: DC

## 2023-04-09 MED ORDER — ATORVASTATIN CALCIUM 80 MG PO TABS: 80.0000 mg | ORAL_TABLET | Freq: Every day | ORAL | 0 refills | Status: DC

## 2023-04-09 NOTE — Telephone Encounter (Signed)
Pt would like Glimepiride 1 mg, Pantoprazole Sod DR 40 mg, Atorvastatin 80 mg Tablet, and Metformin HCL 1,000 MG Tablet all to be sent to [facility] on [address] . He needs refills to be sent

## 2023-04-09 NOTE — Telephone Encounter (Signed)
Prescriptions sent to [facility] per his request. Glimepiride still has remaining refills

## 2023-04-23 DIAGNOSIS — N1831 Chronic kidney disease, stage 3a: Secondary | ICD-10-CM | POA: Diagnosis not present

## 2023-04-23 DIAGNOSIS — N2581 Secondary hyperparathyroidism of renal origin: Secondary | ICD-10-CM | POA: Diagnosis not present

## 2023-04-23 DIAGNOSIS — I129 Hypertensive chronic kidney disease with stage 1 through stage 4 chronic kidney disease, or unspecified chronic kidney disease: Secondary | ICD-10-CM | POA: Diagnosis not present

## 2023-04-23 DIAGNOSIS — R809 Proteinuria, unspecified: Secondary | ICD-10-CM | POA: Diagnosis not present

## 2023-04-23 DIAGNOSIS — E1122 Type 2 diabetes mellitus with diabetic chronic kidney disease: Secondary | ICD-10-CM | POA: Diagnosis not present

## 2023-04-23 DIAGNOSIS — E875 Hyperkalemia: Secondary | ICD-10-CM | POA: Diagnosis not present

## 2023-04-23 DIAGNOSIS — D631 Anemia in chronic kidney disease: Secondary | ICD-10-CM | POA: Diagnosis not present

## 2023-05-25 ENCOUNTER — Other Ambulatory Visit: Payer: Self-pay | Admitting: Family Medicine

## 2023-05-25 DIAGNOSIS — I10 Essential (primary) hypertension: Secondary | ICD-10-CM

## 2023-07-04 ENCOUNTER — Other Ambulatory Visit: Payer: Self-pay | Admitting: Family Medicine

## 2023-07-04 DIAGNOSIS — E1121 Type 2 diabetes mellitus with diabetic nephropathy: Secondary | ICD-10-CM

## 2023-07-05 ENCOUNTER — Other Ambulatory Visit: Payer: Self-pay | Admitting: Family Medicine

## 2023-07-05 DIAGNOSIS — E782 Mixed hyperlipidemia: Secondary | ICD-10-CM

## 2023-07-07 NOTE — Telephone Encounter (Signed)
Requested Prescriptions  Pending Prescriptions Disp Refills   atorvastatin (LIPITOR) 80 MG tablet [Pharmacy Med Name: Atorvastatin Calcium 80 MG Oral Tablet] 90 tablet 0    Sig: TAKE 1 TABLET BY MOUTH ONCE DAILY AT BEDTIME     Cardiovascular:  Antilipid - Statins Failed - 07/07/2023  8:17 AM      Failed - Lipid Panel in normal range within the last 12 months    Cholesterol, Total  Date Value Ref Range Status  10/15/2016 172 100 - 199 mg/dL Final   Cholesterol  Date Value Ref Range Status  04/03/2023 98 <200 mg/dL Final   LDL Cholesterol (Calc)  Date Value Ref Range Status  04/03/2023 40 mg/dL (calc) Final    Comment:    Reference range: <100 . Desirable range <100 mg/dL for primary prevention;   <70 mg/dL for patients with CHD or diabetic patients  with > or = 2 CHD risk factors. Marland Kitchen LDL-C is now calculated using the Martin-Hopkins  calculation, which is a validated novel method providing  better accuracy than the Friedewald equation in the  estimation of LDL-C.  Horald Pollen et al. Lenox Ahr. 7846;962(95): 2061-2068  (http://education.[employer].com/faq/FAQ164)    HDL  Date Value Ref Range Status  04/03/2023 39 (L) > OR = 40 mg/dL Final  28/41/3244 39 (L) >39 mg/dL Final   Triglycerides  Date Value Ref Range Status  04/03/2023 103 <150 mg/dL Final         Passed - Patient is not pregnant      Passed - Valid encounter within last 12 months    Recent Outpatient Visits           3 months ago Type 2 diabetes mellitus with hyperglycemia, without long-term current use of insulin (HCC)   [place] [facility] Mecum, Erin E, PA-C   7 months ago Uncontrolled diabetes mellitus with hyperglycemia, without long-term current use of insulin [facility])   [place] [facility] Danelle Berry, PA-C   10 months ago Primary hypertension   [place] [facility] Danelle Berry, PA-C   1 year ago Primary hypertension   [place] [facility] Danelle Berry, PA-C   1 year ago Upper respiratory tract infection, unspecified type   [facility] Danelle Berry, PA-C       Future Appointments             In 2 days Danelle Berry, PA-C [facility], [place] [facility]

## 2023-07-09 ENCOUNTER — Ambulatory Visit: Payer: Medicare Other | Admitting: Family Medicine

## 2023-07-10 ENCOUNTER — Other Ambulatory Visit: Payer: Self-pay | Admitting: Family Medicine

## 2023-07-10 DIAGNOSIS — E782 Mixed hyperlipidemia: Secondary | ICD-10-CM

## 2023-07-10 DIAGNOSIS — I10 Essential (primary) hypertension: Secondary | ICD-10-CM

## 2023-07-15 ENCOUNTER — Other Ambulatory Visit: Payer: Self-pay | Admitting: Family Medicine

## 2023-07-15 DIAGNOSIS — K2100 Gastro-esophageal reflux disease with esophagitis, without bleeding: Secondary | ICD-10-CM

## 2023-07-15 DIAGNOSIS — E782 Mixed hyperlipidemia: Secondary | ICD-10-CM

## 2023-07-22 NOTE — Assessment & Plan Note (Signed)
Has been well controlled on norvasc BP Readings from Last 3 Encounters:  04/03/23 134/76  12/02/22 124/76  09/06/22 (!) 146/84

## 2023-07-22 NOTE — Progress Notes (Signed)
 Name: Paul Spears   MRN: [id number]    DOB: 03/02/1946   Date:07/22/2023       Progress Note  No chief complaint on file.  PT no showed  Subjective:   Paul Spears is a 78 y.o. male, presents to clinic for routine follow up on chronic conditions  DM:  uncontrolled with recent increase in A1C to 8.7 Pt managing DM with farxiga , metformin , glimepiride  Reports good med compliance Recent pertinent labs: Lab Results  Component Value Date   HGBA1C 8.7 (H) 04/03/2023   HGBA1C 7.7 (H) 12/13/2022   HGBA1C 7.5 (H) 09/06/2022   Lab Results  Component Value Date   MICROALBUR 0.8 12/04/2021   LDLCALC 40 04/03/2023   CREATININE 1.42 (H) 04/03/2023   Standard of care and health maintenance: Urine Microalbumin: due  Foot exam:  UTD till summer DM eye exam:  due ACEI/ARB:  no Statin:  lipitor   Chronic anemia Hemoglobin  Date Value Ref Range Status  04/03/2023 10.9 (L) 13.2 - 17.1 g/dL Final  96/77/7975 88.1 (L) 13.2 - 17.1 g/dL Final  93/79/7976 88.7 (L) 13.2 - 17.1 g/dL Final  96/84/7976 88.8 (L) 13.2 - 17.1 g/dL Final    Hyperlipidemia: Currently treated with lipitor  80 and zetia , pt reports good med compliance Last Lipids: Lab Results  Component Value Date   CHOL 98 04/03/2023   HDL 39 (L) 04/03/2023   LDLCALC 40 04/03/2023   TRIG 103 04/03/2023   CHOLHDL 2.5 04/03/2023   - Denies: Chest pain, shortness of breath, myalgias, claudication    Current Outpatient Medications:    albuterol  (PROAIR  HFA) 108 (90 Base) MCG/ACT inhaler, Inhale 2 puffs into the lungs every 4 (four) hours as needed for wheezing or shortness of breath., Disp: 18 g, Rfl: 2   amLODipine  (NORVASC ) 5 MG tablet, Take 1 tablet by mouth once daily for blood pressure, Disp: 90 tablet, Rfl: 0   atorvastatin  (LIPITOR ) 80 MG tablet, TAKE 1 TABLET BY MOUTH ONCE DAILY AT BEDTIME, Disp: 90 tablet, Rfl: 0   Cholecalciferol (VITAMIN D3 PO), Take 1 capsule by mouth daily., Disp: , Rfl:    ezetimibe  (ZETIA )  10 MG tablet, TAKE 1 TABLET BY MOUTH EVERY DAY, Disp: 90 tablet, Rfl: 0   famotidine  (PEPCID ) 20 MG tablet, TAKE 1 TABLET BY MOUTH TWICE DAILY AS NEEDED FOR  HEARTBURN  AND  INDIGESTION, Disp: 180 tablet, Rfl: 0   FARXIGA  10 MG TABS tablet, TAKE 1 TABLET BY MOUTH ONCE DAILY IN THE MORNING, Disp: 30 tablet, Rfl: 0   Ferrous Sulfate  (IRON  PO), Take by mouth. 5000IU (Patient not taking: Reported on 04/03/2023), Disp: , Rfl:    fluticasone  (FLONASE ) 50 MCG/ACT nasal spray, Place 2 sprays into both nostrils daily as needed., Disp: 16 g, Rfl: 11   glimepiride  (AMARYL ) 1 MG tablet, Take 1-2 tablets (1-2 mg total) by mouth 2 (two) times daily with a meal. HOLD IF BLOOD SUGARS ARE LESS THAN 100 OR IF NOT EATING, Disp: 270 tablet, Rfl: 1   glucose blood (ACCU-CHEK AVIVA PLUS) test strip, Use as instructed, Disp: 100 each, Rfl: 3   Lancets (ACCU-CHEK MULTICLIX) lancets, USE AS DIRECTED, Disp: 102 each, Rfl: 12   levocetirizine (XYZAL ) 5 MG tablet, Take 1 tablet (5 mg total) by mouth every evening. (Patient not taking: Reported on 04/03/2023), Disp: 90 tablet, Rfl: 3   metFORMIN  (GLUCOPHAGE ) 1000 MG tablet, TAKE 1 TABLET (1,000 MG TOTAL) BY MOUTH TWICE A DAY WITH FOOD, Disp: 180 tablet, Rfl: 0  pantoprazole  (PROTONIX ) 40 MG tablet, Take 1 tablet by mouth once daily, Disp: 90 tablet, Rfl: 0  Patient Active Problem List   Diagnosis Date Noted   Chest tightness 09/11/2022   Arthritis of left knee 12/13/2021   Sprain of MCL (medial collateral ligament) of knee 12/13/2021   Secondary hyperparathyroidism of renal origin (HCC) 12/04/2021   Reactive airway disease without complication 12/04/2021   Current moderate episode of major depressive disorder, unspecified whether recurrent (HCC) 12/04/2021   Potential for cognitive impairment 05/30/2021   Acquired trigger finger of right ring finger 09/24/2019   Anemia in chronic kidney disease 08/23/2019   Essential (hemorrhagic) thrombocythemia (HCC)    Iron   deficiency anemia 11/11/2017   DDD (degenerative disc disease), cervical 12/07/2015   GERD with esophagitis 09/21/2014   Barrett esophagus 09/21/2014   Cervical osteoarthritis 09/21/2014   Type 2 diabetes mellitus (HCC) 09/21/2014   Microalbuminuria 09/21/2014   Osteopenia 09/21/2014   Stage 3a chronic kidney disease (HCC) 09/21/2014   HTN (hypertension) 05/20/2014   Hyperlipidemia 05/20/2014   Abnormal respiratory rate 05/20/2014   Benign hypertensive kidney disease with chronic kidney disease 05/20/2014    Past Surgical History:  Procedure Laterality Date   CARPAL TUNNEL RELEASE Right    COLONOSCOPY  10/26/2012   repeat in 10 years MUS   COLONOSCOPY WITH PROPOFOL  N/A 02/08/2019   Procedure: COLONOSCOPY WITH PROPOFOL ;  Surgeon: Gaylyn Gladis PENNER, MD;  Location: [facility] ENDOSCOPY;  Service: Endoscopy;  Laterality: N/A;   COLONOSCOPY WITH PROPOFOL  N/A 06/14/2022   Procedure: COLONOSCOPY WITH PROPOFOL ;  Surgeon: Maryruth Ole DASEN, MD;  Location: [facility] ENDOSCOPY;  Service: Endoscopy;  Laterality: N/A;   ESOPHAGOGASTRODUODENOSCOPY (EGD) WITH PROPOFOL  N/A 10/22/2016   Procedure: ESOPHAGOGASTRODUODENOSCOPY (EGD) WITH PROPOFOL ;  Surgeon: Gaylyn Gladis PENNER, MD;  Location: [facility] ENDOSCOPY;  Service: Endoscopy;  Laterality: N/A;   ESOPHAGOGASTRODUODENOSCOPY (EGD) WITH PROPOFOL  N/A 07/14/2018   Procedure: ESOPHAGOGASTRODUODENOSCOPY (EGD) WITH PROPOFOL ;  Surgeon: Jinny Carmine, MD;  Location: [facility] ENDOSCOPY;  Service: Endoscopy;  Laterality: N/A;   ESOPHAGOGASTRODUODENOSCOPY (EGD) WITH PROPOFOL  N/A 06/14/2022   Procedure: ESOPHAGOGASTRODUODENOSCOPY (EGD) WITH PROPOFOL ;  Surgeon: Maryruth Ole DASEN, MD;  Location: [facility] ENDOSCOPY;  Service: Endoscopy;  Laterality: N/A;   FRACTURE SURGERY Left    arm   SHOULDER ARTHROSCOPY WITH SUBACROMIAL DECOMPRESSION AND BICEP TENDON REPAIR Right 04/16/2018   Procedure: ARTHROSCOPIC  BICEP TENOTOMY;  Surgeon: Marchia Drivers, MD;  Location: [facility] ORS;  Service:  Orthopedics;  Laterality: Right;   UPPER ESOPHAGEAL ENDOSCOPIC ULTRASOUND (EUS)  6/15 ue to repeat in 1 year    Family History  Problem Relation Age of Onset   Hypertension Father    Heart attack Father    Heart disease Sister    Hypertension Sister     Social History   Tobacco Use   Smoking status: Never   Smokeless tobacco: Never  Vaping Use   Vaping status: Never Used  Substance Use Topics   Alcohol use: No   Drug use: No     Allergies  Allergen Reactions   Losartan  Potassium Other (See Comments)    hyperkalemia    Health Maintenance  Topic Date Due   Medicare Annual Wellness (AWV)  06/21/2020   OPHTHALMOLOGY EXAM  10/19/2021   COVID-19 Vaccine (4 - 2024-25 season) 02/16/2023   Diabetic kidney evaluation - Urine ACR  04/16/2023   INFLUENZA VACCINE  09/15/2023 (Originally 01/16/2023)   Zoster Vaccines- Shingrix (1 of 2) 10/19/2023 (Originally 01/09/1965)   HEMOGLOBIN A1C  10/02/2023   FOOT EXAM  12/02/2023   Diabetic kidney evaluation - eGFR measurement  04/02/2024   DTaP/Tdap/Td (2 - Td or Tdap) 04/03/2026   Pneumonia Vaccine 38+ Years old  Completed   Hepatitis C Screening  Completed   HPV VACCINES  Aged Out   Colonoscopy  Discontinued    Chart Review Today: I personally reviewed active problem list, medication list, allergies, family history, social history, health maintenance, notes from last encounter, lab results, imaging with the patient/caregiver today.   Review of Systems  Constitutional: Negative.   HENT: Negative.    Eyes: Negative.   Respiratory: Negative.    Cardiovascular: Negative.   Gastrointestinal: Negative.   Endocrine: Negative.   Genitourinary: Negative.   Musculoskeletal: Negative.   Skin: Negative.   Allergic/Immunologic: Negative.   Neurological: Negative.   Hematological: Negative.   Psychiatric/Behavioral: Negative.    All other systems reviewed and are negative.    Objective:   There were no vitals filed for this  visit.  There is no height or weight on file to calculate BMI.  Physical Exam   Functional Status Survey:   Results for orders placed or performed in visit on 04/03/23  COMPLETE METABOLIC PANEL WITH GFR   Collection Time: 04/03/23  9:21 AM  Result Value Ref Range   Glucose, Bld 111 (H) 65 - 99 mg/dL   BUN 19 7 - 25 mg/dL   Creat 8.57 (H) 9.29 - 1.28 mg/dL   eGFR 51 (L) > OR = 60 mL/min/1.3m2   BUN/Creatinine Ratio 13 6 - 22 (calc)   Sodium 140 135 - 146 mmol/L   Potassium 4.7 3.5 - 5.3 mmol/L   Chloride 106 98 - 110 mmol/L   CO2 21 20 - 32 mmol/L   Calcium  9.4 8.6 - 10.3 mg/dL   Total Protein 6.8 6.1 - 8.1 g/dL   Albumin 4.5 3.6 - 5.1 g/dL   Globulin 2.3 1.9 - 3.7 g/dL (calc)   AG Ratio 2.0 1.0 - 2.5 (calc)   Total Bilirubin 0.8 0.2 - 1.2 mg/dL   Alkaline phosphatase (APISO) 99 35 - 144 U/L   AST 15 10 - 35 U/L   ALT 10 9 - 46 U/L  CBC w/Diff/Platelet   Collection Time: 04/03/23  9:21 AM  Result Value Ref Range   WBC 6.3 3.8 - 10.8 Thousand/uL   RBC 4.57 4.20 - 5.80 Million/uL   Hemoglobin 10.9 (L) 13.2 - 17.1 g/dL   HCT 63.8 (L) 61.4 - 49.9 %   MCV 79.0 (L) 80.0 - 100.0 fL   MCH 23.9 (L) 27.0 - 33.0 pg   MCHC 30.2 (L) 32.0 - 36.0 g/dL   RDW 84.0 (H) 88.9 - 84.9 %   Platelets 361 140 - 400 Thousand/uL   MPV 10.8 7.5 - 12.5 fL   Neutro Abs 3,635 1,500 - 7,800 cells/uL   Absolute Lymphocytes 1,632 850 - 3,900 cells/uL   Absolute Monocytes 655 200 - 950 cells/uL   Eosinophils Absolute 265 15 - 500 cells/uL   Basophils Absolute 113 0 - 200 cells/uL   Neutrophils Relative % 57.7 %   Total Lymphocyte 25.9 %   Monocytes Relative 10.4 %   Eosinophils Relative 4.2 %   Basophils Relative 1.8 %  Lipid Profile   Collection Time: 04/03/23  9:21 AM  Result Value Ref Range   Cholesterol 98 <200 mg/dL   HDL 39 (L) > OR = 40 mg/dL   Triglycerides 896 <849 mg/dL   LDL Cholesterol (Calc) 40 mg/dL (calc)  Total CHOL/HDL Ratio 2.5 <5.0 (calc)   Non-HDL Cholesterol (Calc) 59  <130 mg/dL (calc)  HgB J8r   Collection Time: 04/03/23  9:21 AM  Result Value Ref Range   Hgb A1c MFr Bld 8.7 (H) <5.7 % of total Hgb   Mean Plasma Glucose 203 mg/dL   eAG (mmol/L) 88.7 mmol/L      Assessment & Plan:   Type 2 diabetes mellitus with hyperglycemia, without long-term current use of insulin  (HCC)  Current moderate episode of major depressive disorder, unspecified whether recurrent (HCC)  Primary hypertension  Hyperlipidemia, unspecified hyperlipidemia type  Primary hypertension  Mixed hyperlipidemia  Controlled type 2 diabetes mellitus with diabetic nephropathy, without long-term current use of insulin  (HCC)     No follow-ups on file.   Michelene Cower, PA-C 07/22/23 6:01 PM

## 2023-07-22 NOTE — Assessment & Plan Note (Signed)
    04/03/2023    8:04 AM 12/02/2022    8:43 AM 09/06/2022    8:20 AM  Depression screen PHQ 2/9  Decreased Interest 0 2 0  Down, Depressed, Hopeless 0 2 0  PHQ - 2 Score 0 4 0  Altered sleeping 0 2 0  Tired, decreased energy 0 2 0  Change in appetite 0 0 0  Feeling bad or failure about yourself  0 0 0  Trouble concentrating 0 2 0  Moving slowly or fidgety/restless 0 0 0  Suicidal thoughts 0 0 0  PHQ-9 Score 0 10 0  Difficult doing work/chores Not difficult at all Somewhat difficult Not difficult at all

## 2023-07-22 NOTE — Assessment & Plan Note (Signed)
Uncontrolled on glipizide, farxiga, metformin Lab Results  Component Value Date   HGBA1C 8.7 (H) 04/03/2023   HGBA1C 7.7 (H) 12/13/2022   HGBA1C 7.5 (H) 09/06/2022   HGBA1C 8.2 (H) 06/06/2022   HGBA1C 7.5 (H) 12/04/2021   HGBA1C 7.9 (H) 08/29/2021

## 2023-07-23 ENCOUNTER — Ambulatory Visit (INDEPENDENT_AMBULATORY_CARE_PROVIDER_SITE_OTHER): Payer: Medicare Other | Admitting: Family Medicine

## 2023-07-23 DIAGNOSIS — I10 Essential (primary) hypertension: Secondary | ICD-10-CM

## 2023-07-23 DIAGNOSIS — E1165 Type 2 diabetes mellitus with hyperglycemia: Secondary | ICD-10-CM

## 2023-07-23 DIAGNOSIS — F321 Major depressive disorder, single episode, moderate: Secondary | ICD-10-CM

## 2023-07-23 DIAGNOSIS — E785 Hyperlipidemia, unspecified: Secondary | ICD-10-CM

## 2023-07-24 ENCOUNTER — Encounter: Payer: Self-pay | Admitting: Family Medicine

## 2023-07-24 ENCOUNTER — Ambulatory Visit (INDEPENDENT_AMBULATORY_CARE_PROVIDER_SITE_OTHER): Payer: Medicare Other | Admitting: Family Medicine

## 2023-07-24 VITALS — BP 136/82 | HR 76 | Resp 16 | Ht 71.0 in | Wt 193.0 lb

## 2023-07-24 DIAGNOSIS — I10 Essential (primary) hypertension: Secondary | ICD-10-CM | POA: Diagnosis not present

## 2023-07-24 DIAGNOSIS — E782 Mixed hyperlipidemia: Secondary | ICD-10-CM | POA: Diagnosis not present

## 2023-07-24 DIAGNOSIS — M25551 Pain in right hip: Secondary | ICD-10-CM

## 2023-07-24 DIAGNOSIS — N1831 Chronic kidney disease, stage 3a: Secondary | ICD-10-CM | POA: Diagnosis not present

## 2023-07-24 DIAGNOSIS — D631 Anemia in chronic kidney disease: Secondary | ICD-10-CM | POA: Diagnosis not present

## 2023-07-24 DIAGNOSIS — K2100 Gastro-esophageal reflux disease with esophagitis, without bleeding: Secondary | ICD-10-CM

## 2023-07-24 DIAGNOSIS — G8929 Other chronic pain: Secondary | ICD-10-CM | POA: Diagnosis not present

## 2023-07-24 DIAGNOSIS — J45909 Unspecified asthma, uncomplicated: Secondary | ICD-10-CM | POA: Diagnosis not present

## 2023-07-24 DIAGNOSIS — Z5181 Encounter for therapeutic drug level monitoring: Secondary | ICD-10-CM

## 2023-07-24 DIAGNOSIS — Z0289 Encounter for other administrative examinations: Secondary | ICD-10-CM

## 2023-07-24 DIAGNOSIS — D509 Iron deficiency anemia, unspecified: Secondary | ICD-10-CM

## 2023-07-24 DIAGNOSIS — E1165 Type 2 diabetes mellitus with hyperglycemia: Secondary | ICD-10-CM | POA: Diagnosis not present

## 2023-07-24 DIAGNOSIS — D473 Essential (hemorrhagic) thrombocythemia: Secondary | ICD-10-CM | POA: Diagnosis not present

## 2023-07-24 DIAGNOSIS — N2581 Secondary hyperparathyroidism of renal origin: Secondary | ICD-10-CM

## 2023-07-24 LAB — POCT GLYCOSYLATED HEMOGLOBIN (HGB A1C): Hemoglobin A1C: 7.5 % — AB (ref 4.0–5.6)

## 2023-07-24 MED ORDER — FAMOTIDINE 20 MG PO TABS: 20.0000 mg | ORAL_TABLET | ORAL | 1 refills | Status: DC | PRN

## 2023-07-24 MED ORDER — GLIMEPIRIDE 1 MG PO TABS: 1.0000 mg | ORAL_TABLET | Freq: Two times a day (BID) | ORAL | 1 refills | Status: DC

## 2023-07-24 MED ORDER — FARXIGA 10 MG PO TABS: 10.0000 mg | ORAL_TABLET | Freq: Every morning | ORAL | 1 refills | Status: DC

## 2023-07-24 MED ORDER — EZETIMIBE 10 MG PO TABS: 10.0000 mg | ORAL_TABLET | Freq: Every day | ORAL | 1 refills | Status: DC

## 2023-07-24 MED ORDER — ATORVASTATIN CALCIUM 80 MG PO TABS: 80.0000 mg | ORAL_TABLET | Freq: Every day | ORAL | 1 refills | Status: DC

## 2023-07-24 NOTE — Assessment & Plan Note (Signed)
 On iron supplement Monitoring CBC  Previously he consulted [facility] for IDA and GI issues, he is on iron supplement

## 2023-07-24 NOTE — Assessment & Plan Note (Signed)
 Rarely needing SABA, stable, well controlled

## 2023-07-24 NOTE — Assessment & Plan Note (Signed)
 Hx of ulceration/esophagitis and anemia, on long term PPI Refilled protonix , use pepcid  PRN, continue to avoid NSAIDs, ETOH, today pt states he takes a lot of goody and BC powders - advised to avoid OTC products with ASA in them, increased risk of bleeding and GI irritation

## 2023-07-24 NOTE — Assessment & Plan Note (Signed)
 Monitoring CBC, he is also seeing nephrology

## 2023-07-24 NOTE — Assessment & Plan Note (Signed)
 Est with nephrology Trying to keep him on farxiga , avoiding NSAIDS Cannot take ARB  Not on ACEI Lab Results  Component Value Date   EGFR 51 (L) 04/03/2023   EGFR 67 09/06/2022   EGFR 52 (L) 12/04/2021

## 2023-07-24 NOTE — Assessment & Plan Note (Signed)
Stable thrombocytosis - monitoring 

## 2023-07-24 NOTE — Assessment & Plan Note (Signed)
 Managed on amlodipine  5mg  BP near goal today, could consider increasing dose to 10 mg BP Readings from Last 3 Encounters:  07/24/23 136/82  04/03/23 134/76  12/02/22 124/76

## 2023-07-24 NOTE — Assessment & Plan Note (Signed)
 Well controlled on current management, labs reviewed from recent visit Lab Results  Component Value Date   CHOL 98 04/03/2023   HDL 39 (L) 04/03/2023   LDLCALC 40 04/03/2023   TRIG 103 04/03/2023   CHOLHDL 2.5 04/03/2023

## 2023-07-24 NOTE — Assessment & Plan Note (Signed)
 Per nephrology  Last OV and labs reviewed

## 2023-07-24 NOTE — Progress Notes (Signed)
 Name: Paul Spears   MRN: [id number]    DOB: 01/21/1946   Date:07/24/2023       Progress Note  Chief Complaint  Patient presents with   Medical Management of Chronic Issues    3 months   Diabetes     Subjective:   Paul Spears is a 78 y.o. male, presents to clinic for routine follow up on chronic conditions  DM:  uncontrolled with recent increase in A1C to 8.7 Pt managing DM with farxiga , metformin , glimepiride  Reports good med compliance Recent pertinent labs: Lab Results  Component Value Date   HGBA1C 7.5 (A) 07/24/2023   HGBA1C 8.7 (H) 04/03/2023   HGBA1C 7.7 (H) 12/13/2022   Lab Results  Component Value Date   MICROALBUR 0.8 12/04/2021   LDLCALC 40 04/03/2023   CREATININE 1.42 (H) 04/03/2023   Standard of care and health maintenance: Urine Microalbumin: due  Foot exam:  UTD till summer DM eye exam:  due ACEI/ARB:  no Statin:  lipitor   Chronic anemia likely of renal disease Hemoglobin  Date Value Ref Range Status  04/03/2023 10.9 (L) 13.2 - 17.1 g/dL Final  96/77/7975 88.1 (L) 13.2 - 17.1 g/dL Final  93/79/7976 88.7 (L) 13.2 - 17.1 g/dL Final  96/84/7976 88.8 (L) 13.2 - 17.1 g/dL Final    Hyperlipidemia: Currently treated with lipitor  80 and zetia , pt reports good med compliance Last Lipids: Lab Results  Component Value Date   CHOL 98 04/03/2023   HDL 39 (L) 04/03/2023   LDLCALC 40 04/03/2023   TRIG 103 04/03/2023   CHOLHDL 2.5 04/03/2023   - Denies: Chest pain, shortness of breath, myalgias, claudication  Hip pain from prior injury or dislocation, asking for handicap application    Current Outpatient Medications:    albuterol  (PROAIR  HFA) 108 (90 Base) MCG/ACT inhaler, Inhale 2 puffs into the lungs every 4 (four) hours as needed for wheezing or shortness of breath., Disp: 18 g, Rfl: 2   amLODipine  (NORVASC ) 5 MG tablet, Take 1 tablet by mouth once daily for blood pressure, Disp: 90 tablet, Rfl: 0   atorvastatin  (LIPITOR ) 80 MG tablet, TAKE 1  TABLET BY MOUTH ONCE DAILY AT BEDTIME, Disp: 90 tablet, Rfl: 0   Cholecalciferol (VITAMIN D3 PO), Take 1 capsule by mouth daily., Disp: , Rfl:    ezetimibe  (ZETIA ) 10 MG tablet, TAKE 1 TABLET BY MOUTH EVERY DAY, Disp: 90 tablet, Rfl: 0   famotidine  (PEPCID ) 20 MG tablet, TAKE 1 TABLET BY MOUTH TWICE DAILY AS NEEDED FOR  HEARTBURN  AND  INDIGESTION, Disp: 180 tablet, Rfl: 0   FARXIGA  10 MG TABS tablet, TAKE 1 TABLET BY MOUTH ONCE DAILY IN THE MORNING, Disp: 30 tablet, Rfl: 0   fluticasone  (FLONASE ) 50 MCG/ACT nasal spray, Place 2 sprays into both nostrils daily as needed., Disp: 16 g, Rfl: 11   glimepiride  (AMARYL ) 1 MG tablet, Take 1-2 tablets (1-2 mg total) by mouth 2 (two) times daily with a meal. HOLD IF BLOOD SUGARS ARE LESS THAN 100 OR IF NOT EATING, Disp: 270 tablet, Rfl: 1   glucose blood (ACCU-CHEK AVIVA PLUS) test strip, Use as instructed, Disp: 100 each, Rfl: 3   Lancets (ACCU-CHEK MULTICLIX) lancets, USE AS DIRECTED, Disp: 102 each, Rfl: 12   metFORMIN  (GLUCOPHAGE ) 1000 MG tablet, TAKE 1 TABLET (1,000 MG TOTAL) BY MOUTH TWICE A DAY WITH FOOD, Disp: 180 tablet, Rfl: 0   pantoprazole  (PROTONIX ) 40 MG tablet, Take 1 tablet by mouth once daily, Disp: 90 tablet,  Rfl: 0   Ferrous Sulfate  (IRON  PO), Take by mouth. 5000IU (Patient not taking: Reported on 07/24/2023), Disp: , Rfl:    levocetirizine (XYZAL ) 5 MG tablet, Take 1 tablet (5 mg total) by mouth every evening. (Patient not taking: Reported on 07/24/2023), Disp: 90 tablet, Rfl: 3  Patient Active Problem List   Diagnosis Date Noted   Chest tightness 09/11/2022   Arthritis of left knee 12/13/2021   Sprain of MCL (medial collateral ligament) of knee 12/13/2021   Secondary hyperparathyroidism of renal origin (HCC) 12/04/2021   Reactive airway disease without complication 12/04/2021   Current moderate episode of major depressive disorder, unspecified whether recurrent (HCC) 12/04/2021   Potential for cognitive impairment 05/30/2021   Acquired  trigger finger of right ring finger 09/24/2019   Anemia in chronic kidney disease 08/23/2019   Essential (hemorrhagic) thrombocythemia (HCC)    Iron  deficiency anemia 11/11/2017   DDD (degenerative disc disease), cervical 12/07/2015   GERD with esophagitis 09/21/2014   Barrett esophagus 09/21/2014   Cervical osteoarthritis 09/21/2014   Type 2 diabetes mellitus (HCC) 09/21/2014   Microalbuminuria 09/21/2014   Osteopenia 09/21/2014   Stage 3a chronic kidney disease (HCC) 09/21/2014   HTN (hypertension) 05/20/2014   Hyperlipidemia 05/20/2014   Abnormal respiratory rate 05/20/2014   Benign hypertensive kidney disease with chronic kidney disease 05/20/2014    Past Surgical History:  Procedure Laterality Date   CARPAL TUNNEL RELEASE Right    COLONOSCOPY  10/26/2012   repeat in 10 years MUS   COLONOSCOPY WITH PROPOFOL  N/A 02/08/2019   Procedure: COLONOSCOPY WITH PROPOFOL ;  Surgeon: Gaylyn Gladis PENNER, MD;  Location: [facility] ENDOSCOPY;  Service: Endoscopy;  Laterality: N/A;   COLONOSCOPY WITH PROPOFOL  N/A 06/14/2022   Procedure: COLONOSCOPY WITH PROPOFOL ;  Surgeon: Maryruth Ole DASEN, MD;  Location: [facility] ENDOSCOPY;  Service: Endoscopy;  Laterality: N/A;   ESOPHAGOGASTRODUODENOSCOPY (EGD) WITH PROPOFOL  N/A 10/22/2016   Procedure: ESOPHAGOGASTRODUODENOSCOPY (EGD) WITH PROPOFOL ;  Surgeon: Gaylyn Gladis PENNER, MD;  [facility] ENDOSCOPY;  Service: Endoscopy;  Laterality: N/A;   ESOPHAGOGASTRODUODENOSCOPY (EGD) WITH PROPOFOL  N/A 07/14/2018   Procedure: ESOPHAGOGASTRODUODENOSCOPY (EGD) WITH PROPOFOL ;  Surgeon: Jinny Carmine, MD;  Location: [facility] ENDOSCOPY;  Service: Endoscopy;  Laterality: N/A;   ESOPHAGOGASTRODUODENOSCOPY (EGD) WITH PROPOFOL  N/A 06/14/2022   Procedure: ESOPHAGOGASTRODUODENOSCOPY (EGD) WITH PROPOFOL ;  Surgeon: Maryruth Ole DASEN, MD;  Location: [facility] ENDOSCOPY;  Service: Endoscopy;  Laterality: N/A;   FRACTURE SURGERY Left    arm   SHOULDER ARTHROSCOPY WITH SUBACROMIAL DECOMPRESSION  AND BICEP TENDON REPAIR Right 04/16/2018   Procedure: ARTHROSCOPIC  BICEP TENOTOMY;  Surgeon: Marchia Drivers, MD;  Location: [facility] ORS;  Service: Orthopedics;  Laterality: Right;   UPPER ESOPHAGEAL ENDOSCOPIC ULTRASOUND (EUS)  6/15 ue to repeat in 1 year    Family History  Problem Relation Age of Onset   Hypertension Father    Heart attack Father    Heart disease Sister    Hypertension Sister     Social History   Tobacco Use   Smoking status: Never   Smokeless tobacco: Never  Vaping Use   Vaping status: Never Used  Substance Use Topics   Alcohol use: No   Drug use: No     Allergies  Allergen Reactions   Losartan  Potassium Other (See Comments)    hyperkalemia    Health Maintenance  Topic Date Due   Medicare Annual Wellness (AWV)  06/21/2020   OPHTHALMOLOGY EXAM  10/19/2021   Diabetic kidney evaluation - Urine ACR  04/16/2023   COVID-19 Vaccine (4 -  2024-25 season) 08/09/2023 (Originally 02/16/2023)   INFLUENZA VACCINE  09/15/2023 (Originally 01/16/2023)   Zoster Vaccines- Shingrix (1 of 2) 10/19/2023 (Originally 01/09/1965)   FOOT EXAM  12/02/2023   HEMOGLOBIN A1C  01/21/2024   Diabetic kidney evaluation - eGFR measurement  04/02/2024   DTaP/Tdap/Td (2 - Td or Tdap) 04/03/2026   Pneumonia Vaccine 25+ Years old  Completed   Hepatitis C Screening  Completed   HPV VACCINES  Aged Out   Colonoscopy  Discontinued    Chart Review Today: I personally reviewed active problem list, medication list, allergies, family history, social history, health maintenance, notes from last encounter, lab results, imaging with the patient/caregiver today.   Review of Systems  Constitutional: Negative.   HENT: Negative.    Eyes: Negative.   Respiratory: Negative.    Cardiovascular: Negative.   Gastrointestinal: Negative.   Endocrine: Negative.   Genitourinary: Negative.   Musculoskeletal: Negative.   Skin: Negative.   Allergic/Immunologic: Negative.   Neurological: Negative.    Hematological: Negative.   Psychiatric/Behavioral: Negative.    All other systems reviewed and are negative.    Objective:   Vitals:   07/24/23 0835  BP: 136/82  Pulse: 76  Resp: 16  SpO2: 97%  Weight: 193 lb (87.5 kg)  Height: 5' 11 (1.803 m)    Body mass index is 26.92 kg/m.  Physical Exam Vitals and nursing note reviewed.  Constitutional:      General: He is not in acute distress.    Appearance: Normal appearance. He is well-developed. He is not ill-appearing, toxic-appearing or diaphoretic.  HENT:     Head: Normocephalic and atraumatic.     Nose: Nose normal.  Eyes:     General:        Right eye: No discharge.        Left eye: No discharge.     Conjunctiva/sclera: Conjunctivae normal.  Neck:     Trachea: No tracheal deviation.  Cardiovascular:     Rate and Rhythm: Normal rate and regular rhythm.     Pulses: Normal pulses.     Heart sounds: Normal heart sounds. No murmur heard.    No friction rub. No gallop.  Pulmonary:     Effort: Pulmonary effort is normal. No respiratory distress.     Breath sounds: Normal breath sounds. No stridor. No wheezing, rhonchi or rales.  Abdominal:     General: Bowel sounds are normal.     Palpations: Abdomen is soft.     Tenderness: There is no right CVA tenderness or left CVA tenderness.  Skin:    General: Skin is warm and dry.     Findings: No rash.  Neurological:     Mental Status: He is alert.     Motor: No abnormal muscle tone.     Coordination: Coordination normal.  Psychiatric:        Mood and Affect: Mood normal.        Behavior: Behavior normal.      Functional Status Survey:   Results for orders placed or performed in visit on 07/24/23  POCT HgB A1C   Collection Time: 07/24/23  8:45 AM  Result Value Ref Range   Hemoglobin A1C 7.5 (A) 4.0 - 5.6 %   HbA1c POC (<> result, manual entry)     HbA1c, POC (prediabetic range)     HbA1c, POC (controlled diabetic range)        Assessment & Plan:   Type 2  diabetes mellitus with hyperglycemia, without long-term  current use of insulin  [facility]) Assessment & Plan: Uncontrolled, on metformin , farxiga  and glipizide  (which I have tried many times to get him off of) POC A1C did show a little improvement compared to last OV Lab Results  Component Value Date   HGBA1C 7.5 (A) 07/24/2023   HGBA1C 8.7 (H) 04/03/2023   HGBA1C 7.7 (H) 12/13/2022   HGBA1C 7.5 (H) 09/06/2022   HGBA1C 8.2 (H) 06/06/2022   HGBA1C 7.5 (H) 12/04/2021  Due for UACR Continue same meds and diet efforts  Orders: -     POCT glycosylated hemoglobin (Hb A1C) -     Microalbumin / creatinine urine ratio -     Glimepiride ; Take 1-2 tablets (1-2 mg total) by mouth 2 (two) times daily with a meal. HOLD IF BLOOD SUGARS ARE LESS THAN 100 OR IF NOT EATING  Dispense: 270 tablet; Refill: 1 -     Farxiga ; Take 1 tablet (10 mg total) by mouth every morning.  Dispense: 90 tablet; Refill: 1 -     COMPLETE METABOLIC PANEL WITH GFR  Gastroesophageal reflux disease with esophagitis, unspecified whether hemorrhage Assessment & Plan: Hx of ulceration/esophagitis and anemia, on long term PPI Refilled protonix , use pepcid  PRN, continue to avoid NSAIDs, ETOH, today pt states he takes a lot of goody and BC powders - advised to avoid OTC products with ASA in them, increased risk of bleeding and GI irritation  Orders: -     Famotidine ; Take 1 tablet (20 mg total) by mouth as needed for heartburn or indigestion.  Dispense: 180 tablet; Refill: 1  Mixed hyperlipidemia Assessment & Plan: Well controlled on current management, labs reviewed from recent visit Lab Results  Component Value Date   CHOL 98 04/03/2023   HDL 39 (L) 04/03/2023   LDLCALC 40 04/03/2023   TRIG 103 04/03/2023   CHOLHDL 2.5 04/03/2023     Orders: -     COMPLETE METABOLIC PANEL WITH GFR -     Ezetimibe ; Take 1 tablet (10 mg total) by mouth daily.  Dispense: 90 tablet; Refill: 1 -     Atorvastatin  Calcium ; Take 1 tablet (80 mg  total) by mouth at bedtime.  Dispense: 90 tablet; Refill: 1  Anemia in stage 3a chronic kidney disease (HCC) Assessment & Plan: Monitoring CBC, he is also seeing nephrology   Orders: -     CBC with Differential/Platelet  Stage 3a chronic kidney disease (HCC) Assessment & Plan: Est with nephrology Trying to keep him on farxiga , avoiding NSAIDS Cannot take ARB  Not on ACEI Lab Results  Component Value Date   EGFR 51 (L) 04/03/2023   EGFR 67 09/06/2022   EGFR 52 (L) 12/04/2021       Essential (hemorrhagic) thrombocythemia (HCC) Assessment & Plan: Stable thrombocytosis - monitoring     Primary hypertension Assessment & Plan: Managed on amlodipine  5mg  BP near goal today, could consider increasing dose to 10 mg BP Readings from Last 3 Encounters:  07/24/23 136/82  04/03/23 134/76  12/02/22 124/76      Reactive airway disease without complication, unspecified asthma severity, unspecified whether persistent Assessment & Plan: Rarely needing SABA, stable, well controlled   Secondary hyperparathyroidism of renal origin [facility]) Assessment & Plan: Per nephrology  Last OV and labs reviewed   Iron  deficiency anemia, unspecified iron  deficiency anemia type Assessment & Plan: On iron  supplement Monitoring CBC  Previously he consulted [facility] for IDA and GI issues, he is on iron  supplement   Chronic right hip pain Pt reports  chronic pain, past trauma and dislocation and inability to walk very far before having to stop and rest - handicap placard application filled out  No prior acute visits or consult for this, offered ortho referral   Encounter for medication monitoring -     POCT glycosylated hemoglobin (Hb A1C) -     COMPLETE METABOLIC PANEL WITH GFR -     CBC with Differential/Platelet  Encounter for completion of form with patient Forms completed with patient today x 6 month    Return in about 6 months (around 01/21/2024) for Routine follow-up.    Michelene Cower, PA-C 07/24/23 8:49 AM

## 2023-07-24 NOTE — Assessment & Plan Note (Signed)
 Uncontrolled, on metformin , farxiga  and glipizide  (which I have tried many times to get him off of) POC A1C did show a little improvement compared to last OV Lab Results  Component Value Date   HGBA1C 7.5 (A) 07/24/2023   HGBA1C 8.7 (H) 04/03/2023   HGBA1C 7.7 (H) 12/13/2022   HGBA1C 7.5 (H) 09/06/2022   HGBA1C 8.2 (H) 06/06/2022   HGBA1C 7.5 (H) 12/04/2021  Due for UACR Continue same meds and diet efforts

## 2023-07-25 LAB — MICROALBUMIN / CREATININE URINE RATIO
Creatinine, Urine: 399 mg/dL — ABNORMAL HIGH (ref 20–320)
Microalb Creat Ratio: 16 mg/g{creat} (ref <30)
Microalb, Ur: 6.5 mg/dL

## 2023-07-25 LAB — COMPLETE METABOLIC PANEL WITH GFR
AG Ratio: 1.7 (calc) (ref 1.0–2.5)
ALT: 9 U/L (ref 9–46)
AST: 12 U/L (ref 10–35)
Albumin: 4.3 g/dL (ref 3.6–5.1)
Alkaline phosphatase (APISO): 113 U/L (ref 35–144)
BUN/Creatinine Ratio: 10 (calc) (ref 6–22)
BUN: 15 mg/dL (ref 7–25)
CO2: 23 mmol/L (ref 20–32)
Calcium: 10.1 mg/dL (ref 8.6–10.3)
Chloride: 102 mmol/L (ref 98–110)
Creat: 1.43 mg/dL — ABNORMAL HIGH (ref 0.70–1.28)
Globulin: 2.5 g/dL (ref 1.9–3.7)
Glucose, Bld: 140 mg/dL — ABNORMAL HIGH (ref 65–99)
Potassium: 4.8 mmol/L (ref 3.5–5.3)
Sodium: 137 mmol/L (ref 135–146)
Total Bilirubin: 0.7 mg/dL (ref 0.2–1.2)
Total Protein: 6.8 g/dL (ref 6.1–8.1)
eGFR: 50 mL/min/{1.73_m2} — ABNORMAL LOW (ref >=60)

## 2023-07-25 LAB — CBC WITH DIFFERENTIAL/PLATELET
Absolute Lymphocytes: 1757 {cells}/uL (ref 850–3900)
Absolute Monocytes: 621 {cells}/uL (ref 200–950)
Basophils Absolute: 93 {cells}/uL (ref 0–200)
Basophils Relative: 1.6 %
Eosinophils Absolute: 249 {cells}/uL (ref 15–500)
Eosinophils Relative: 4.3 %
HCT: 35.4 % — ABNORMAL LOW (ref 38.5–50.0)
Hemoglobin: 11 g/dL — ABNORMAL LOW (ref 13.2–17.1)
MCH: 24.6 pg — ABNORMAL LOW (ref 27.0–33.0)
MCHC: 31.1 g/dL — ABNORMAL LOW (ref 32.0–36.0)
MCV: 79 fL — ABNORMAL LOW (ref 80.0–100.0)
MPV: 10.7 fL (ref 7.5–12.5)
Monocytes Relative: 10.7 %
Neutro Abs: 3080 {cells}/uL (ref 1500–7800)
Neutrophils Relative %: 53.1 %
Platelets: 461 10*3/uL — ABNORMAL HIGH (ref 140–400)
RBC: 4.48 10*6/uL (ref 4.20–5.80)
RDW: 16.3 % — ABNORMAL HIGH (ref 11.0–15.0)
Total Lymphocyte: 30.3 %
WBC: 5.8 10*3/uL (ref 3.8–10.8)

## 2023-07-29 ENCOUNTER — Telehealth: Payer: Self-pay | Admitting: Family Medicine

## 2023-07-29 DIAGNOSIS — E1165 Type 2 diabetes mellitus with hyperglycemia: Secondary | ICD-10-CM

## 2023-07-29 DIAGNOSIS — N1831 Chronic kidney disease, stage 3a: Secondary | ICD-10-CM

## 2023-07-29 DIAGNOSIS — R809 Proteinuria, unspecified: Secondary | ICD-10-CM

## 2023-07-29 MED ORDER — EMPAGLIFLOZIN 25 MG PO TABS: 25.0000 mg | ORAL_TABLET | Freq: Every day | ORAL | 5 refills | Status: DC

## 2023-07-29 NOTE — Telephone Encounter (Signed)
Pt called in , states can't afford the med, FARXIGA 10 MG TABS tablet  at 390.00 and is looking for alternative.

## 2023-07-30 ENCOUNTER — Other Ambulatory Visit: Payer: Medicare Other | Admitting: Pharmacist

## 2023-07-30 DIAGNOSIS — E1169 Type 2 diabetes mellitus with other specified complication: Secondary | ICD-10-CM

## 2023-07-30 NOTE — Telephone Encounter (Signed)
Called patient and made aware.

## 2023-07-30 NOTE — Progress Notes (Signed)
07/30/2023 Name: Paul Spears MRN: [id number] DOB: June 12, 1946  Chief Complaint  Patient presents with   Medication Assistance    Paul Spears is a 78 y.o. year old male who presented for a telephone visit.   They were referred to the pharmacist by their PCP for assistance in managing medication access.   Unable to complete medication review as patient reports that he only has time to speak briefly today  Subjective:  Care Team: Primary Care Provider: Sander Radon ; Next Scheduled Visit: 01/21/2024 Nephrologist: Lamont Dowdy, MD; Next Scheduled Visit: 10/22/2023  Medication Access/Adherence  Current Pharmacy:  [facility] [address], [place] - [address] [place] [address] Phone: [phone] Fax: [phone]   Patient reports affordability concerns with their medications: Yes  Patient reports access/transportation concerns to their pharmacy: No  Patient reports adherence concerns with their medications:  No    Reports cost of Marcelline Deist is difficult to afford through his [employer] plan. Reports is not sure which [facility] Medicare plan that he has for 2025 as requested but not yet received the new card  Diabetes:  Current medications:  - Farxiga 10 mg daily - glimepiride 1 mg -takes 1-2 tablets by mouth twice daily with a meal. HOLD IF BLOOD SUGARS ARE LESS THAN 100 OR IF NOT EATING  - metformin 1000 mg twice daily  Previous therapies tried: Jardiance (cost - never filled)  Current glucose readings: morning fasting ranging: 95-133    Objective:  Lab Results  Component Value Date   HGBA1C 7.5 (A) 07/24/2023    Lab Results  Component Value Date   CREATININE 1.43 (H) 07/24/2023   BUN 15 07/24/2023   NA 137 07/24/2023   K 4.8 07/24/2023   CL 102 07/24/2023   CO2 23 07/24/2023    Lab Results  Component Value Date   CHOL 98 04/03/2023   HDL 39 (L) 04/03/2023   LDLCALC 40 04/03/2023   TRIG 103 04/03/2023    CHOLHDL 2.5 04/03/2023    Current Outpatient Medications on File Prior to Visit  Medication Sig Dispense Refill   albuterol (PROAIR HFA) 108 (90 Base) MCG/ACT inhaler Inhale 2 puffs into the lungs every 4 (four) hours as needed for wheezing or shortness of breath. 18 g 2   amLODipine (NORVASC) 5 MG tablet Take 1 tablet by mouth once daily for blood pressure 90 tablet 0   atorvastatin (LIPITOR) 80 MG tablet Take 1 tablet (80 mg total) by mouth at bedtime. 90 tablet 1   Cholecalciferol (VITAMIN D3 PO) Take 1 capsule by mouth daily.     empagliflozin (JARDIANCE) 25 MG TABS tablet Take 1 tablet (25 mg total) by mouth daily before breakfast. (Patient not taking: Reported on 07/30/2023) 30 tablet 5   ezetimibe (ZETIA) 10 MG tablet Take 1 tablet (10 mg total) by mouth daily. 90 tablet 1   famotidine (PEPCID) 20 MG tablet Take 1 tablet (20 mg total) by mouth as needed for heartburn or indigestion. 180 tablet 1   Ferrous Sulfate (IRON PO) Take by mouth. 5000IU (Patient not taking: Reported on 07/24/2023)     fluticasone (FLONASE) 50 MCG/ACT nasal spray Place 2 sprays into both nostrils daily as needed. 16 g 11   glimepiride (AMARYL) 1 MG tablet Take 1-2 tablets (1-2 mg total) by mouth 2 (two) times daily with a meal. HOLD IF BLOOD SUGARS ARE LESS THAN 100 OR IF NOT EATING 270 tablet 1   glucose blood (  ACCU-CHEK AVIVA PLUS) test strip Use as instructed 100 each 3   Lancets (ACCU-CHEK MULTICLIX) lancets USE AS DIRECTED 102 each 12   levocetirizine (XYZAL) 5 MG tablet Take 1 tablet (5 mg total) by mouth every evening. (Patient not taking: Reported on 07/24/2023) 90 tablet 3   metFORMIN (GLUCOPHAGE) 1000 MG tablet TAKE 1 TABLET (1,000 MG TOTAL) BY MOUTH TWICE A DAY WITH FOOD 180 tablet 0   pantoprazole (PROTONIX) 40 MG tablet Take 1 tablet by mouth once daily 90 tablet 0   No current facility-administered medications on file prior to visit.       Assessment/Plan:   Unable to complete medication review  as patient reports that he only has time to speak briefly today  From review of chart, note patient's metformin prescription is out of refills - Will collaborate with PCP to request renewal  Diabetes: - Currently uncontrolled - Reviewed long term cardiovascular and renal outcomes of uncontrolled blood sugar - Reviewed goal A1c, goal fasting, and goal 2 hour post prandial glucose - Recommend to check glucose, keep log of results and have this record to review at upcoming medical appointments. Patient to contact provider office sooner if needed for readings outside of established parameters or symptoms - Meets financial criteria for Farxiga patient assistance program through [employer]. Will collaborate with provider, CPhT, and patient to pursue assistance.     Follow Up Plan: Clinical Pharmacist will follow up with patient by telephone on 09/03/2023 at 9:30 AM   Estelle Grumbles, PharmD, [facility] [phone]

## 2023-07-30 NOTE — Patient Instructions (Signed)
Goals Addressed             This Visit's Progress    Pharmacy Goals       Please watch the mail for an envelope from [facility] containing the patient assistance program application. Please complete this application and bring to office to have it faxed back to Attention: Linward Foster at Fax # [phone] along with a copy of your Medicare Part D prescription card and a copy of your proof of income document.  If you need to call Joni Reining, you can reach her at [phone].  If you need to reach out to patient assistance programs regarding refills or to find out the status of your application, you can do so by calling:  [employer] at [phone]  Thank you!  Estelle Grumbles, PharmD, [facility] [phone]

## 2023-07-31 ENCOUNTER — Other Ambulatory Visit (HOSPITAL_COMMUNITY): Payer: Self-pay

## 2023-07-31 ENCOUNTER — Telehealth: Payer: Self-pay

## 2023-07-31 ENCOUNTER — Encounter: Payer: Self-pay | Admitting: Oncology

## 2023-07-31 NOTE — Telephone Encounter (Signed)
A user error has taken place: orders placed in error, not carried out on this patient.

## 2023-07-31 NOTE — Telephone Encounter (Signed)
-----   Message from Manuela Neptune sent at 07/30/2023  4:48 PM EST ----- Joni Reining, would you please assist patient with applying for Farxiga from [employer] as prescribed by PCP, Danelle Berry?  Thank you!  Gentry Fitz

## 2023-07-31 NOTE — Telephone Encounter (Signed)
PAP: Patient assistance application for Farxiga through [employer] ([employer]) has been mailed to pt's home address on file.     FAXING Provider portion of application  TO OFFICE OF LEISA TABIA PA-C TO BE REVIEW AND PROCESSED

## 2023-08-07 NOTE — Telephone Encounter (Signed)
I HAVE RECEIVED PROVIDER PAGES WAITING FOR PT PAGES. WAS MAIL TO PT HOME ON 07/31/2023   PLEASE BE ADVISED I HAVE SCANNED PROVIDER PAGES TO MEDIA OF CHART.

## 2023-08-18 ENCOUNTER — Telehealth: Payer: Self-pay

## 2023-08-18 NOTE — Telephone Encounter (Signed)
 Following up on pt PAP [employer] Paul Spears) spoke with pt explain pt will be receiving it from the [place] explain it is $0 copay and does not need for [place] to submitted an application at this time,pt will let us know if when needed.

## 2023-09-03 ENCOUNTER — Other Ambulatory Visit: Payer: Self-pay | Admitting: Pharmacist

## 2023-09-03 ENCOUNTER — Telehealth: Payer: Self-pay | Admitting: Pharmacist

## 2023-09-03 NOTE — Progress Notes (Signed)
   Outreach Note  09/03/2023 Name: Paul Spears MRN: [id number] DOB: Oct 23, 1945  Referred by: Danelle Berry, PA-C Reason for referral : Medication Assistance   Was unable to reach patient via telephone today.  From review of chart, note CPhT Lillia Abed outreach to patient on 08/18/2023 and was advised that he no longer needed assistance with cost of his Marcelline Deist as he was able to obtain this through the [place].  Leave HIPAA compliant voicemail asking patient to return my call if has any further medication questions or concerns.  Estelle Grumbles, PharmD, [facility] [phone]

## 2023-10-16 DIAGNOSIS — R809 Proteinuria, unspecified: Secondary | ICD-10-CM | POA: Diagnosis not present

## 2023-10-16 DIAGNOSIS — E875 Hyperkalemia: Secondary | ICD-10-CM | POA: Diagnosis not present

## 2023-10-16 DIAGNOSIS — N1831 Chronic kidney disease, stage 3a: Secondary | ICD-10-CM | POA: Diagnosis not present

## 2023-10-16 DIAGNOSIS — I129 Hypertensive chronic kidney disease with stage 1 through stage 4 chronic kidney disease, or unspecified chronic kidney disease: Secondary | ICD-10-CM | POA: Diagnosis not present

## 2023-10-16 DIAGNOSIS — E1122 Type 2 diabetes mellitus with diabetic chronic kidney disease: Secondary | ICD-10-CM | POA: Diagnosis not present

## 2023-10-22 DIAGNOSIS — E1122 Type 2 diabetes mellitus with diabetic chronic kidney disease: Secondary | ICD-10-CM | POA: Diagnosis not present

## 2023-10-22 DIAGNOSIS — R809 Proteinuria, unspecified: Secondary | ICD-10-CM | POA: Diagnosis not present

## 2023-10-22 DIAGNOSIS — E875 Hyperkalemia: Secondary | ICD-10-CM | POA: Diagnosis not present

## 2023-10-22 DIAGNOSIS — I129 Hypertensive chronic kidney disease with stage 1 through stage 4 chronic kidney disease, or unspecified chronic kidney disease: Secondary | ICD-10-CM | POA: Diagnosis not present

## 2023-10-22 DIAGNOSIS — N1831 Chronic kidney disease, stage 3a: Secondary | ICD-10-CM | POA: Diagnosis not present

## 2023-11-10 ENCOUNTER — Other Ambulatory Visit: Payer: Self-pay | Admitting: Family Medicine

## 2023-11-10 DIAGNOSIS — K2100 Gastro-esophageal reflux disease with esophagitis, without bleeding: Secondary | ICD-10-CM

## 2023-11-13 NOTE — Telephone Encounter (Signed)
 Requested Prescriptions  Pending Prescriptions Disp Refills   pantoprazole  (PROTONIX ) 40 MG tablet [Pharmacy Med Name: Pantoprazole  Sodium 40 MG Oral Tablet Delayed Release] 90 tablet 1    Sig: Take 1 tablet by mouth once daily     Gastroenterology: Proton Pump Inhibitors Passed - 11/13/2023 12:53 PM      Passed - Valid encounter within last 12 months    Recent Outpatient Visits           3 months ago Type 2 diabetes mellitus with hyperglycemia, without long-term current use of insulin [facility])   [place] [facility] Adeline Hone, PA-C   3 months ago Type 2 diabetes mellitus with hyperglycemia, without long-term current use of insulin (HCC)   [place] [facility] Adeline Hone, PA-C       Future Appointments             In 2 months Tapia, Leisa, PA-C [facility]             metFORMIN  (GLUCOPHAGE ) 1000 MG tablet [Pharmacy Med Name: metFORMIN  HCl 1000 MG Oral Tablet] 180 tablet 0    Sig: Take 1 tablet by mouth twice daily with food     Endocrinology:  Diabetes - Biguanides Failed - 11/13/2023 12:53 PM      Failed - Cr in normal range and within 360 days    Creat  Date Value Ref Range Status  07/24/2023 1.43 (H) 0.70 - 1.28 mg/dL Final   Creatinine, Urine  Date Value Ref Range Status  07/24/2023 399 (H) 20 - 320 mg/dL Final         Failed - eGFR in normal range and within 360 days    GFR, Est African American  Date Value Ref Range Status  01/28/2020 60 > OR = 60 mL/min/1.65m2 Final   GFR, Est Non African American  Date Value Ref Range Status  01/28/2020 51 (L) > OR = 60 mL/min/1.63m2 Final   eGFR  Date Value Ref Range Status  07/24/2023 50 (L) > OR = 60 mL/min/1.65m2 Final         Failed - B12 Level in normal range and within 720 days    Vitamin B-12  Date Value Ref Range Status  08/29/2021 290 200 - 1,100 pg/mL Final    Comment:    . Please Note: Although the reference range for vitamin B12 is  [phone] pg/mL, it has been reported that between 5 and 10% of patients with values between 200 and 400 pg/mL may experience neuropsychiatric and hematologic abnormalities due to occult B12 deficiency; less than 1% of patients with values above 400 pg/mL will have symptoms. .          Passed - HBA1C is between 0 and 7.9 and within 180 days    Hemoglobin A1C  Date Value Ref Range Status  07/24/2023 7.5 (A) 4.0 - 5.6 % Final   Hgb A1c MFr Bld  Date Value Ref Range Status  04/03/2023 8.7 (H) <5.7 % of total Hgb Final    Comment:    For someone without known diabetes, a hemoglobin A1c value of 6.5% or greater indicates that they may have  diabetes and this should be confirmed with a follow-up  test. . For someone with known diabetes, a value <7% indicates  that their diabetes is well controlled and a value  greater than or equal to 7% indicates suboptimal  control. A1c targets should be individualized based on  duration  of diabetes, age, comorbid conditions, and  other considerations. . Currently, no consensus exists regarding use of hemoglobin A1c for diagnosis of diabetes for children. Temple Feeler - Valid encounter within last 6 months    Recent Outpatient Visits           3 months ago Type 2 diabetes mellitus with hyperglycemia, without long-term current use of insulin [facility])   [facility] Adeline Hone, PA-C   3 months ago Type 2 diabetes mellitus with hyperglycemia, without long-term current use of insulin [facility])   [facility] Adeline Hone, PA-C       Future Appointments             In 2 months Tapia, Leisa, PA-C [facility]            Passed - CBC within normal limits and completed in the last 12 months    WBC  Date Value Ref Range Status  07/24/2023 5.8 3.8 - 10.8 Thousand/uL Final   RBC  Date Value Ref Range Status  07/24/2023 4.48 4.20 - 5.80 Million/uL Final    Hemoglobin  Date Value Ref Range Status  07/24/2023 11.0 (L) 13.2 - 17.1 g/dL Final   HCT  Date Value Ref Range Status  07/24/2023 35.4 (L) 38.5 - 50.0 % Final   MCHC  Date Value Ref Range Status  07/24/2023 31.1 (L) 32.0 - 36.0 g/dL Final    Comment:    For adults, a slight decrease in the calculated MCHC value (in the range of 30 to 32 g/dL) is most likely not clinically significant; however, it should be interpreted with caution in correlation with other red cell parameters and the patient's clinical condition.    [facility]  Date Value Ref Range Status  07/24/2023 24.6 (L) 27.0 - 33.0 pg Final   MCV  Date Value Ref Range Status  07/24/2023 79.0 (L) 80.0 - 100.0 fL Final   No results found for: "PLTCOUNTKUC", "LABPLAT", "POCPLA" RDW  Date Value Ref Range Status  07/24/2023 16.3 (H) 11.0 - 15.0 % Final

## 2023-12-03 ENCOUNTER — Other Ambulatory Visit: Payer: Self-pay

## 2023-12-03 ENCOUNTER — Emergency Department

## 2023-12-03 ENCOUNTER — Inpatient Hospital Stay
Admission: EM | Admit: 2023-12-03 | Discharge: 2023-12-05 | DRG: 481 | Disposition: A | Discharge status: Home Health | Attending: Internal Medicine | Admitting: Internal Medicine

## 2023-12-03 DIAGNOSIS — Z6828 Body mass index (BMI) 28.0-28.9, adult: Secondary | ICD-10-CM | POA: Diagnosis not present

## 2023-12-03 DIAGNOSIS — E1165 Type 2 diabetes mellitus with hyperglycemia: Secondary | ICD-10-CM | POA: Diagnosis present

## 2023-12-03 DIAGNOSIS — E669 Obesity, unspecified: Secondary | ICD-10-CM | POA: Diagnosis present

## 2023-12-03 DIAGNOSIS — N1832 Chronic kidney disease, stage 3b: Secondary | ICD-10-CM | POA: Diagnosis not present

## 2023-12-03 DIAGNOSIS — E1122 Type 2 diabetes mellitus with diabetic chronic kidney disease: Secondary | ICD-10-CM | POA: Diagnosis present

## 2023-12-03 DIAGNOSIS — W010XXA Fall on same level from slipping, tripping and stumbling without subsequent striking against object, initial encounter: Secondary | ICD-10-CM | POA: Diagnosis present

## 2023-12-03 DIAGNOSIS — N2581 Secondary hyperparathyroidism of renal origin: Secondary | ICD-10-CM | POA: Diagnosis not present

## 2023-12-03 DIAGNOSIS — R0989 Other specified symptoms and signs involving the circulatory and respiratory systems: Secondary | ICD-10-CM | POA: Diagnosis not present

## 2023-12-03 DIAGNOSIS — I10 Essential (primary) hypertension: Secondary | ICD-10-CM

## 2023-12-03 DIAGNOSIS — Z23 Encounter for immunization: Secondary | ICD-10-CM | POA: Diagnosis not present

## 2023-12-03 DIAGNOSIS — Z888 Allergy status to other drugs, medicaments and biological substances status: Secondary | ICD-10-CM

## 2023-12-03 DIAGNOSIS — Z79899 Other long term (current) drug therapy: Secondary | ICD-10-CM

## 2023-12-03 DIAGNOSIS — Z7984 Long term (current) use of oral hypoglycemic drugs: Secondary | ICD-10-CM | POA: Diagnosis not present

## 2023-12-03 DIAGNOSIS — K219 Gastro-esophageal reflux disease without esophagitis: Secondary | ICD-10-CM | POA: Diagnosis not present

## 2023-12-03 DIAGNOSIS — M1611 Unilateral primary osteoarthritis, right hip: Secondary | ICD-10-CM | POA: Diagnosis not present

## 2023-12-03 DIAGNOSIS — S7292XA Unspecified fracture of left femur, initial encounter for closed fracture: Principal | ICD-10-CM

## 2023-12-03 DIAGNOSIS — E785 Hyperlipidemia, unspecified: Secondary | ICD-10-CM | POA: Diagnosis present

## 2023-12-03 DIAGNOSIS — R918 Other nonspecific abnormal finding of lung field: Secondary | ICD-10-CM | POA: Diagnosis not present

## 2023-12-03 DIAGNOSIS — S72001A Fracture of unspecified part of neck of right femur, initial encounter for closed fracture: Secondary | ICD-10-CM | POA: Diagnosis not present

## 2023-12-03 DIAGNOSIS — D509 Iron deficiency anemia, unspecified: Secondary | ICD-10-CM | POA: Diagnosis present

## 2023-12-03 DIAGNOSIS — S7291XA Unspecified fracture of right femur, initial encounter for closed fracture: Secondary | ICD-10-CM | POA: Diagnosis not present

## 2023-12-03 DIAGNOSIS — S72402A Unspecified fracture of lower end of left femur, initial encounter for closed fracture: Secondary | ICD-10-CM | POA: Diagnosis not present

## 2023-12-03 DIAGNOSIS — K2270 Barrett's esophagus without dysplasia: Secondary | ICD-10-CM | POA: Diagnosis present

## 2023-12-03 DIAGNOSIS — M25551 Pain in right hip: Secondary | ICD-10-CM | POA: Diagnosis present

## 2023-12-03 DIAGNOSIS — I129 Hypertensive chronic kidney disease with stage 1 through stage 4 chronic kidney disease, or unspecified chronic kidney disease: Secondary | ICD-10-CM | POA: Diagnosis not present

## 2023-12-03 DIAGNOSIS — H9190 Unspecified hearing loss, unspecified ear: Secondary | ICD-10-CM | POA: Diagnosis not present

## 2023-12-03 DIAGNOSIS — Z8249 Family history of ischemic heart disease and other diseases of the circulatory system: Secondary | ICD-10-CM | POA: Diagnosis not present

## 2023-12-03 DIAGNOSIS — I517 Cardiomegaly: Secondary | ICD-10-CM | POA: Diagnosis not present

## 2023-12-03 DIAGNOSIS — S72009A Fracture of unspecified part of neck of unspecified femur, initial encounter for closed fracture: Secondary | ICD-10-CM | POA: Diagnosis not present

## 2023-12-03 DIAGNOSIS — S72001D Fracture of unspecified part of neck of right femur, subsequent encounter for closed fracture with routine healing: Secondary | ICD-10-CM | POA: Diagnosis not present

## 2023-12-03 LAB — CBC WITH DIFFERENTIAL/PLATELET
Abs Immature Granulocytes: 0.04 10*3/uL (ref 0.00–0.07)
Basophils Absolute: 0.1 10*3/uL (ref 0.0–0.1)
Basophils Relative: 1 %
Eosinophils Absolute: 0.2 10*3/uL (ref 0.0–0.5)
Eosinophils Relative: 2 %
HCT: 33.6 % — ABNORMAL LOW (ref 39.0–52.0)
Hemoglobin: 10.6 g/dL — ABNORMAL LOW (ref 13.0–17.0)
Immature Granulocytes: 1 %
Lymphocytes Relative: 18 %
Lymphs Abs: 1.6 10*3/uL (ref 0.7–4.0)
MCH: 24.5 pg — ABNORMAL LOW (ref 26.0–34.0)
MCHC: 31.5 g/dL (ref 30.0–36.0)
MCV: 77.8 fL — ABNORMAL LOW (ref 80.0–100.0)
Monocytes Absolute: 0.6 10*3/uL (ref 0.1–1.0)
Monocytes Relative: 7 %
Neutro Abs: 6.3 10*3/uL (ref 1.7–7.7)
Neutrophils Relative %: 71 %
Platelets: 370 10*3/uL (ref 150–400)
RBC: 4.32 MIL/uL (ref 4.22–5.81)
RDW: 16.6 % — ABNORMAL HIGH (ref 11.5–15.5)
WBC: 8.8 10*3/uL (ref 4.0–10.5)
nRBC: 0 % (ref 0.0–0.2)

## 2023-12-03 LAB — COMPREHENSIVE METABOLIC PANEL WITH GFR
ALT: 16 U/L (ref 0–44)
AST: 23 U/L (ref 15–41)
Albumin: 4.2 g/dL (ref 3.5–5.0)
Alkaline Phosphatase: 114 U/L (ref 38–126)
Anion gap: 8 (ref 5–15)
BUN: 17 mg/dL (ref 8–23)
CO2: 20 mmol/L — ABNORMAL LOW (ref 22–32)
Calcium: 9.3 mg/dL (ref 8.9–10.3)
Chloride: 108 mmol/L (ref 98–111)
Creatinine, Ser: 1.57 mg/dL — ABNORMAL HIGH (ref 0.61–1.24)
GFR, Estimated: 45 mL/min — ABNORMAL LOW (ref >60)
Glucose, Bld: 328 mg/dL — ABNORMAL HIGH (ref 70–99)
Potassium: 5 mmol/L (ref 3.5–5.1)
Sodium: 136 mmol/L (ref 135–145)
Total Bilirubin: 1.1 mg/dL (ref 0.0–1.2)
Total Protein: 7.3 g/dL (ref 6.5–8.1)

## 2023-12-03 LAB — PROTIME-INR
INR: 1.2 (ref 0.8–1.2)
Prothrombin Time: 15.8 s — ABNORMAL HIGH (ref 11.4–15.2)

## 2023-12-03 LAB — APTT: aPTT: 28 s (ref 24–36)

## 2023-12-03 MED ORDER — FAMOTIDINE 20 MG PO TABS
20.0000 mg | ORAL_TABLET | ORAL | Status: DC | PRN
  Administered 2023-12-05: 20 mg via ORAL
  Filled 2023-12-03: qty 1

## 2023-12-03 MED ORDER — HYDROMORPHONE HCL 1 MG/ML IJ SOLN
1.0000 mg | INTRAMUSCULAR | Status: DC | PRN
  Administered 2023-12-03 (×2): 1 mg via INTRAVENOUS
  Filled 2023-12-03 (×2): qty 1

## 2023-12-03 MED ORDER — HYDROMORPHONE HCL 1 MG/ML IJ SOLN
0.5000 mg | Freq: Once | INTRAMUSCULAR | Status: AC
  Administered 2023-12-03: 0.5 mg via INTRAVENOUS
  Filled 2023-12-03: qty 0.5

## 2023-12-03 MED ORDER — EZETIMIBE 10 MG PO TABS
10.0000 mg | ORAL_TABLET | Freq: Every day | ORAL | Status: DC
  Administered 2023-12-03: 10 mg via ORAL
  Filled 2023-12-03 (×2): qty 1

## 2023-12-03 MED ORDER — ONDANSETRON HCL 4 MG/2ML IJ SOLN
4.0000 mg | Freq: Once | INTRAMUSCULAR | Status: AC
  Administered 2023-12-03: 4 mg via INTRAVENOUS
  Filled 2023-12-03: qty 2

## 2023-12-03 MED ORDER — ATORVASTATIN CALCIUM 80 MG PO TABS
80.0000 mg | ORAL_TABLET | Freq: Every day | ORAL | Status: DC
  Administered 2023-12-03: 80 mg via ORAL
  Filled 2023-12-03 (×2): qty 1

## 2023-12-03 MED ORDER — TETANUS-DIPHTH-ACELL PERTUSSIS 5-2.5-18.5 LF-MCG/0.5 IM SUSY
0.5000 mL | PREFILLED_SYRINGE | Freq: Once | INTRAMUSCULAR | Status: AC
  Administered 2023-12-03: 0.5 mL via INTRAMUSCULAR
  Filled 2023-12-03: qty 0.5

## 2023-12-03 MED ORDER — AMLODIPINE BESYLATE 5 MG PO TABS
5.0000 mg | ORAL_TABLET | Freq: Every day | ORAL | Status: DC
  Administered 2023-12-03 – 2023-12-05 (×2): 5 mg via ORAL
  Filled 2023-12-03 (×3): qty 1

## 2023-12-03 NOTE — ED Notes (Signed)
 Pt c/o 10/10 pain at this time. States that the dilaudid did not help. MD made aware.

## 2023-12-03 NOTE — H&P (Signed)
 History and Physical    Patient: Paul Spears:[id number] DOB: 31-Dec-1945 DOA: 12/03/2023 DOS: the patient was seen and examined on 12/03/2023 PCP: Adeline Hone, PA-C  Patient coming from: Home  Chief Complaint: Right hip pain Chief Complaint  Patient presents with   Hip Pain   HPI: Paul Spears is a 78 y.o. male with medical history significant of essential hypertension, GERD, hyperlipidemia, iron deficiency anemia, CKD stage IIIb, hearing impairment who was otherwise well comes in from home lives with son.  According to patient he uses [place] to walk about however he slipped and had a fall landing on his right hip thereby having right hip pain and therefore came to the emergency room for further management.  Pain of intensity 9/10 relieved by pain medication administered in the emergency room. ED course: Temperature 97.8 respiratory rate 20 pulse 115 blood pressure 153/95 saturating 96% on room air. X-ray of the hip obtained showed right sided femoral neck fracture ED physician discussed with Dr. Lydia Sams orthopedic surgeon who agreed for patient to be admitted for surgical intervention tomorrow Review of Systems: As mentioned in the history of present illness. All other systems reviewed and are negative. Past Medical History:  Diagnosis Date   Allergy    Barrett's esophagus    Diabetes mellitus without complication (HCC)    Esophagitis    LA grade C   Essential hypertension 05/20/2014   Formatting of this note might be different from the original.  Last Assessment & Plan:   BP is well controlled.     Failure of erection 09/21/2014   GERD (gastroesophageal reflux disease)    Hyperlipidemia    Hypertension    Iron deficiency anemia 11/11/2017   Kidney disease, chronic, stage III (GFR 30-59 ml/min) (HCC)    Pain in joint of left knee 12/13/2021   Pain of left forearm 09/12/2021   Potential for cognitive impairment 05/30/2021   Secondary hyperparathyroidism of renal origin  (HCC) 03/07/2021   Past Surgical History:  Procedure Laterality Date   CARPAL TUNNEL RELEASE Right    COLONOSCOPY  10/26/2012   repeat in 10 years MUS   COLONOSCOPY WITH PROPOFOL  N/A 02/08/2019   Procedure: COLONOSCOPY WITH PROPOFOL ;  Surgeon: Deveron Fly, MD;  Location: [facility] ENDOSCOPY;  Service: Endoscopy;  Laterality: N/A;   COLONOSCOPY WITH PROPOFOL  N/A 06/14/2022   Procedure: COLONOSCOPY WITH PROPOFOL ;  Surgeon: Shane Darling, MD;  Location: [facility] ENDOSCOPY;  Service: Endoscopy;  Laterality: N/A;   ESOPHAGOGASTRODUODENOSCOPY (EGD) WITH PROPOFOL  N/A 10/22/2016   Procedure: ESOPHAGOGASTRODUODENOSCOPY (EGD) WITH PROPOFOL ;  Surgeon: Deveron Fly, MD;  Location[facility] ENDOSCOPY;  Service: Endoscopy;  Laterality: N/A;   ESOPHAGOGASTRODUODENOSCOPY (EGD) WITH PROPOFOL  N/A 07/14/2018   Procedure: ESOPHAGOGASTRODUODENOSCOPY (EGD) WITH PROPOFOL ;  Surgeon: Marnee Sink, MD;  Location: [facility] ENDOSCOPY;  Service: Endoscopy;  Laterality: N/A;   ESOPHAGOGASTRODUODENOSCOPY (EGD) WITH PROPOFOL  N/A 06/14/2022   Procedure: ESOPHAGOGASTRODUODENOSCOPY (EGD) WITH PROPOFOL ;  Surgeon: Shane Darling, MD;  Location: [facility] ENDOSCOPY;  Service: Endoscopy;  Laterality: N/A;   FRACTURE SURGERY Left    arm   SHOULDER ARTHROSCOPY WITH SUBACROMIAL DECOMPRESSION AND BICEP TENDON REPAIR Right 04/16/2018   Procedure: ARTHROSCOPIC  BICEP TENOTOMY;  Surgeon: Rande Bushy, MD;  Location: [facility] ORS;  Service: Orthopedics;  Laterality: Right;   UPPER ESOPHAGEAL ENDOSCOPIC ULTRASOUND (EUS)  6/15 ue to repeat in 1 year   Social History:  reports that he has never smoked. He has never used smokeless tobacco. He reports that he does not drink alcohol  and does not use drugs.  Allergies  Allergen Reactions   Losartan  Potassium Other (See Comments)    hyperkalemia    Family History  Problem Relation Age of Onset   Hypertension Father    Heart attack Father    Heart disease Sister    Hypertension Sister      Prior to Admission medications   Medication Sig Start Date End Date Taking? Authorizing Provider  albuterol  (PROAIR  HFA) 108 (90 Base) MCG/ACT inhaler Inhale 2 puffs into the lungs every 4 (four) hours as needed for wheezing or shortness of breath. 03/02/21   Tapia, Leisa, PA-C  amLODipine  (NORVASC ) 5 MG tablet Take 1 tablet by mouth once daily for blood pressure 05/26/23   Tapia, Leisa, PA-C  atorvastatin  (LIPITOR) 80 MG tablet Take 1 tablet (80 mg total) by mouth at bedtime. 07/24/23   Tapia, Leisa, PA-C  Cholecalciferol (VITAMIN D3 PO) Take 1 capsule by mouth daily.    [provider]  empagliflozin  (JARDIANCE ) 25 MG TABS tablet Take 1 tablet (25 mg total) by mouth daily before breakfast. Patient not taking: Reported on 07/30/2023 07/29/23   Tapia, Leisa, PA-C  ezetimibe  (ZETIA ) 10 MG tablet Take 1 tablet (10 mg total) by mouth daily. 07/24/23   Tapia, Leisa, PA-C  famotidine  (PEPCID ) 20 MG tablet Take 1 tablet (20 mg total) by mouth as needed for heartburn or indigestion. 07/24/23   Tapia, Leisa, PA-C  Ferrous Sulfate (IRON PO) Take by mouth. 5000IU Patient not taking: Reported on 07/24/2023    [provider]  fluticasone  (FLONASE ) 50 MCG/ACT nasal spray Place 2 sprays into both nostrils daily as needed. 10/27/19   Tapia, Leisa, PA-C  glimepiride  (AMARYL ) 1 MG tablet Take 1-2 tablets (1-2 mg total) by mouth 2 (two) times daily with a meal. HOLD IF BLOOD SUGARS ARE LESS THAN 100 OR IF NOT EATING 07/24/23   Adeline Hone, PA-C  glucose blood (ACCU-CHEK AVIVA PLUS) test strip Use as instructed 04/03/23   Mecum, Erin E, PA-C  Lancets (ACCU-CHEK MULTICLIX) lancets USE AS DIRECTED 11/26/18   Felice Hora, FNP  levocetirizine (XYZAL ) 5 MG tablet Take 1 tablet (5 mg total) by mouth every evening. Patient not taking: Reported on 07/24/2023 03/27/22   Adeline Hone, PA-C  metFORMIN  (GLUCOPHAGE ) 1000 MG tablet Take 1 tablet by mouth twice daily with food 11/13/23   Tapia, Leisa, PA-C  pantoprazole   (PROTONIX ) 40 MG tablet Take 1 tablet by mouth once daily 11/13/23   Tapia, Leisa, PA-C    Physical Exam: Vitals:   12/03/23 1445  BP: (!) 153/95  Pulse: (!) 115  Resp: 20  Temp: 97.8 F (36.6 C)  SpO2: 96%   Elderly male laying in bed in acute pain Abdomen: Full obese none distended nontender Respiratory: Clear to auscultation bilaterally Musculoskeletal: Some bruises noted in the lower extremity with tenderness noted to the right hip Cardiovascular: S1 and S2 present no Mamma heard CNS: Alert and oriented x 3 able to move all extremities however movement is limited on the right lower extremity on account of right hip pain and fracture  Data Reviewed: X-ray of the hip showing right femoral neck fracture    Latest Ref Rng & Units 12/03/2023    2:48 PM 07/24/2023    9:20 AM 04/03/2023    9:21 AM  CBC  WBC 4.0 - 10.5 K/uL 8.8  5.8  6.3   Hemoglobin 13.0 - 17.0 g/dL 16.1  09.6  04.5   Hematocrit 39.0 - 52.0 %  33.6  35.4  36.1   Platelets 150 - 400 K/uL 370  461  361        Latest Ref Rng & Units 12/03/2023    2:48 PM 07/24/2023    9:20 AM 04/03/2023    9:21 AM  BMP  Glucose 70 - 99 mg/dL 366  440  347   BUN 8 - 23 mg/dL 17  15  19    Creatinine 0.61 - 1.24 mg/dL 4.25  9.56  3.87   BUN/Creat Ratio 6 - 22 (calc)  10  13   Sodium 135 - 145 mmol/L 136  137  140   Potassium 3.5 - 5.1 mmol/L 5.0  4.8  4.7   Chloride 98 - 111 mmol/L 108  102  106   CO2 22 - 32 mmol/L 20  23  21    Calcium  8.9 - 10.3 mg/dL 9.3  56.4  9.4      Assessment and Plan:  Acute nondisplaced right femoral neck fracture Orthopedic surgeon on board Continue as needed pain medication Consult PT OT after surgical intervention  Essential hypertension Continue amlodipine   GERD,  Continue famotidine   Hyperlipidemia,  Continue statin therapy  Iron deficiency anemia,  Not on home medication  CKD stage IIIb Monitor renal function closely  DVT prophylaxis-placed on SCD given planned surgery    Advance Care Planning:   Code Status: Not on file DNR  Consults: Orthopedics  Family Communication: Discussed with patient's son present at bedside  Severity of Illness: The appropriate patient status for this patient is INPATIENT. Inpatient status is judged to be reasonable and necessary in order to provide the required intensity of service to ensure the patient's safety. The patient's presenting symptoms, physical exam findings, and initial radiographic and laboratory data in the context of their chronic comorbidities is felt to place them at high risk for further clinical deterioration. Furthermore, it is not anticipated that the patient will be medically stable for discharge from the hospital within 2 midnights of admission.   * I certify that at the point of admission it is my clinical judgment that the patient will require inpatient hospital care spanning beyond 2 midnights from the point of admission due to high intensity of service, high risk for further deterioration and high frequency of surveillance required.*  Author: Ezzard Holms, MD 12/03/2023 5:32 PM  For on call review [email or web address].

## 2023-12-03 NOTE — Consult Note (Signed)
 ORTHOPAEDIC CONSULTATION  REQUESTING PHYSICIAN: Ezzard Holms, MD Delayed entry - patient seen & examined ~450pm.  Chief Complaint:   R hip pain  History of Present Illness: Paul Spears is a 78 y.o. male w/PMHx of hypertension, GERD, hyperlipidemia, iron deficiency anemia, CKD stage IIIb, and hearing impairment who had a fall at home earlier today.  The patient noted immediate hip pain and inability to ambulate.  The patient ambulates unassisted at baseline. He lives at home with 2 of his children. Another son is present at the bedside today. Pain is worse with any sort of movement.  X-rays in the emergency department show a R valgus impacted femoral neck fracture. He denies any significant antecedent pain.  Past Medical History:  Diagnosis Date   Allergy    Barrett's esophagus    Diabetes mellitus without complication (HCC)    Esophagitis    LA grade C   Essential hypertension 05/20/2014   Formatting of this note might be different from the original.  Last Assessment & Plan:   BP is well controlled.     Failure of erection 09/21/2014   GERD (gastroesophageal reflux disease)    Hyperlipidemia    Hypertension    Iron deficiency anemia 11/11/2017   Kidney disease, chronic, stage III (GFR 30-59 ml/min) (HCC)    Pain in joint of left knee 12/13/2021   Pain of left forearm 09/12/2021   Potential for cognitive impairment 05/30/2021   Secondary hyperparathyroidism of renal origin (HCC) 03/07/2021   Past Surgical History:  Procedure Laterality Date   CARPAL TUNNEL RELEASE Right    COLONOSCOPY  10/26/2012   repeat in 10 years MUS   COLONOSCOPY WITH PROPOFOL  N/A 02/08/2019   Procedure: COLONOSCOPY WITH PROPOFOL ;  Surgeon: Deveron Fly, MD;  Location: [facility] ENDOSCOPY;  Service: Endoscopy;  Laterality: N/A;   COLONOSCOPY WITH PROPOFOL  N/A 06/14/2022   Procedure: COLONOSCOPY WITH PROPOFOL ;  Surgeon: Shane Darling,  MD;  Location: [facility] ENDOSCOPY;  Service: Endoscopy;  Laterality: N/A;   ESOPHAGOGASTRODUODENOSCOPY (EGD) WITH PROPOFOL  N/A 10/22/2016   Procedure: ESOPHAGOGASTRODUODENOSCOPY (EGD) WITH PROPOFOL ;  Surgeon: Deveron Fly, MD;  Location: [facility] ENDOSCOPY;  Service: Endoscopy;  Laterality: N/A;   ESOPHAGOGASTRODUODENOSCOPY (EGD) WITH PROPOFOL  N/A 07/14/2018   Procedure: ESOPHAGOGASTRODUODENOSCOPY (EGD) WITH PROPOFOL ;  Surgeon: Marnee Sink, MD;  Location: [facility] ENDOSCOPY;  Service: Endoscopy;  Laterality: N/A;   ESOPHAGOGASTRODUODENOSCOPY (EGD) WITH PROPOFOL  N/A 06/14/2022   Procedure: ESOPHAGOGASTRODUODENOSCOPY (EGD) WITH PROPOFOL ;  Surgeon: Shane Darling, MD;  Location: [facility] ENDOSCOPY;  Service: Endoscopy;  Laterality: N/A;   FRACTURE SURGERY Left    arm   SHOULDER ARTHROSCOPY WITH SUBACROMIAL DECOMPRESSION AND BICEP TENDON REPAIR Right 04/16/2018   Procedure: ARTHROSCOPIC  BICEP TENOTOMY;  Surgeon: Rande Bushy, MD;  Location: [facility] ORS;  Service: Orthopedics;  Laterality: Right;   UPPER ESOPHAGEAL ENDOSCOPIC ULTRASOUND (EUS)  6/15 ue to repeat in 1 year   Social History   Socioeconomic History   Marital status: Divorced    Spouse name: Not on file   Number of children: 3   Years of education: Not on file   Highest education level: GED or equivalent  Occupational History   Not on file  Tobacco Use   Smoking status: Never   Smokeless tobacco: Never  Vaping Use   Vaping status: Never Used  Substance and Sexual Activity   Alcohol use: No   Drug use: No   Sexual activity: Not Currently  Other Topics Concern   Not on file  Social  History Narrative   Not on file   Social Drivers of Health   Financial Resource Strain: Low Risk  (06/19/2018)   Overall Financial Resource Strain (CARDIA)    Difficulty of Paying Living Expenses: Not hard at all  Food Insecurity: No Food Insecurity (12/03/2023)   Hunger Vital Sign    Worried About Running Out of Food in the Last Year: Never true     Ran Out of Food in the Last Year: Never true  Transportation Needs: No Transportation Needs (12/03/2023)   PRAPARE - Administrator, Civil Service (Medical): No    Lack of Transportation (Non-Medical): No  Physical Activity: Inactive (06/19/2018)   Exercise Vital Sign    Days of Exercise per Week: 0 days    Minutes of Exercise per Session: 0 min  Stress: No Stress Concern Present (06/22/2019)   [employer] of Occupational Health - Occupational Stress Questionnaire    Feeling of Stress : Not at all  Social Connections: Moderately Integrated (12/03/2023)   Social Connection and Isolation Panel    Frequency of Communication with Friends and Family: More than three times a week    Frequency of Social Gatherings with Friends and Family: More than three times a week    Attends Religious Services: More than 4 times per year    Active Member of [employer] or Organizations: Yes    Attends Banker Meetings: 1 to 4 times per year    Marital Status: Divorced   Family History  Problem Relation Age of Onset   Hypertension Father    Heart attack Father    Heart disease Sister    Hypertension Sister    Allergies  Allergen Reactions   Losartan  Potassium Other (See Comments)    hyperkalemia   Prior to Admission medications   Medication Sig Start Date End Date Taking? Authorizing Provider  amLODipine  (NORVASC ) 2.5 MG tablet Take 2.5 mg by mouth daily. 10/22/23 10/21/24 Yes [provider]  atorvastatin  (LIPITOR) 80 MG tablet Take 1 tablet (80 mg total) by mouth at bedtime. 07/24/23  Yes Tapia, Leisa, PA-C  Cholecalciferol (VITAMIN D3 PO) Take 1 capsule by mouth daily.   Yes [provider]  empagliflozin  (JARDIANCE ) 25 MG TABS tablet Take 1 tablet (25 mg total) by mouth daily before breakfast. 07/29/23  Yes Tapia, Leisa, PA-C  ezetimibe  (ZETIA ) 10 MG tablet Take 1 tablet (10 mg total) by mouth daily. 07/24/23  Yes Tapia, Leisa, PA-C  famotidine  (PEPCID ) 20 MG  tablet Take 1 tablet (20 mg total) by mouth as needed for heartburn or indigestion. 07/24/23  Yes Tapia, Leisa, PA-C  glimepiride  (AMARYL ) 1 MG tablet Take 1-2 tablets (1-2 mg total) by mouth 2 (two) times daily with a meal. HOLD IF BLOOD SUGARS ARE LESS THAN 100 OR IF NOT EATING 07/24/23  Yes Tapia, Leisa, PA-C  metFORMIN  (GLUCOPHAGE ) 1000 MG tablet Take 1 tablet by mouth twice daily with food 11/13/23  Yes Tapia, Leisa, PA-C  pantoprazole  (PROTONIX ) 40 MG tablet Take 1 tablet by mouth once daily 11/13/23  Yes Tapia, Leisa, PA-C  albuterol  (PROAIR  HFA) 108 (90 Base) MCG/ACT inhaler Inhale 2 puffs into the lungs every 4 (four) hours as needed for wheezing or shortness of breath. Patient not taking: Reported on 12/03/2023 03/02/21   Tapia, Leisa, PA-C  Ferrous Sulfate (IRON PO) Take by mouth. 5000IU Patient not taking: Reported on 04/03/2023    [provider]  fluticasone  (FLONASE ) 50 MCG/ACT nasal spray Place 2 sprays  into both nostrils daily as needed. Patient not taking: Reported on 12/03/2023 10/27/19   Tapia, Leisa, PA-C  glucose blood (ACCU-CHEK AVIVA PLUS) test strip Use as instructed 04/03/23   Mecum, Erin E, PA-C  Lancets (ACCU-CHEK MULTICLIX) lancets USE AS DIRECTED 11/26/18   Felice Hora, FNP  levocetirizine (XYZAL ) 5 MG tablet Take 1 tablet (5 mg total) by mouth every evening. Patient not taking: Reported on 04/03/2023 03/27/22   Adeline Hone, PA-C   Recent Labs    12/03/23 1448 12/03/23 1700  WBC 8.8  --   HGB 10.6*  --   HCT 33.6*  --   PLT 370  --   K 5.0  --   CL 108  --   CO2 20*  --   BUN 17  --   CREATININE 1.57*  --   GLUCOSE 328*  --   CALCIUM  9.3  --   INR  --  1.2   DG Pelvis 1-2 Views Result Date: 12/03/2023 CLINICAL DATA:  Fall with hip injury EXAM: PELVIS - 1-2 VIEW COMPARISON:  None Available. FINDINGS: Pubic symphysis and rami appear intact. Acute right femoral neck fracture with slight impaction. No dislocation. Moderate degenerative changes of the right  hip IMPRESSION: Acute right femoral neck fracture. Electronically Signed   By: Esmeralda Hedge M.D.   On: 12/03/2023 16:17   DG Chest Portable 1 View Result Date: 12/03/2023 CLINICAL DATA:  Fall with hip fracture EXAM: PORTABLE CHEST 1 VIEW COMPARISON:  05/17/2014 FINDINGS: Low lung volumes. Borderline to mild cardiomegaly. Streaky atelectasis or minimal infiltrate left base. No pneumothorax. IMPRESSION: Low lung volumes with streaky atelectasis or minimal infiltrate at the left base. Borderline to mild cardiomegaly Electronically Signed   By: Esmeralda Hedge M.D.   On: 12/03/2023 16:17   DG FEMUR, MIN 2 VIEWS RIGHT Result Date: 12/03/2023 CLINICAL DATA:  Fall with right-sided hip pain EXAM: RIGHT FEMUR 2 VIEWS COMPARISON:  None Available. FINDINGS: Acute nondisplaced right femoral neck fracture. No femoral head dislocation. Vascular calcifications. IMPRESSION: Acute nondisplaced right femoral neck fracture. Electronically Signed   By: Esmeralda Hedge M.D.   On: 12/03/2023 16:16     Positive ROS: All other systems have been reviewed and were otherwise negative with the exception of those mentioned in the HPI and as above.  Physical Exam: BP 134/76   Pulse (!) 107   Temp 98.1 F (36.7 C) (Oral)   Resp 18   Ht 5' 10 (1.778 m)   Wt 90.3 kg   SpO2 94%   BMI 28.56 kg/m  General:  Alert, no acute distress Psychiatric:  Patient is competent for consent with normal mood and affect    Orthopedic Exam:  RLE: + DF/PF/EHL SILT grossly over foot Foot wwp +Log roll/axial load   Imaging:  As above: R valgus impacted femoral neck fracture with degenerative changes to the hip joint  Assessment/Plan: Paul Spears is a 78 y.o. male with a R valgus impacted femoral neck fracture  1. I discussed the various treatment options including both surgical and non-surgical management of the fracture with the patient and/or family (medical PoA). We discussed the risk of perioperative complications due to  patient's age and other co-morbidities. After discussion of risks, benefits, and alternatives to surgery, the family and/or patient were in agreement to proceed with surgery. The goals of surgery would be to provide adequate pain relief and allow for mobilization. Plan for surgery is R hip percutaneous pinning today tomorrow 12/04/23. 2. NPO  after midnight 3. Hold anticoagulation in advance of OR   Paul Spears   12/03/2023 11:55 PM

## 2023-12-03 NOTE — ED Triage Notes (Addendum)
 Patient states he tripped and fell PTA, complaining of pain to right hip; denies hitting head, no LOC.

## 2023-12-03 NOTE — ED Provider Notes (Signed)
 [facility] Provider Note    Event Date/Time   First MD Initiated Contact with Patient 12/03/23 1555     (approximate)   History   Hip Pain   HPI  TEDDRICK MALLARI is a 78 y.o. male with CKD who comes in for mechanical fall.  Patient reports that he tripped and he fell landing on his right hip.  Did not hit his head did not lose consciousness.  He denies any neck pain.  He denies any chest pain, shortness of breath prior to the fall.  He only reports pain in his right hip.   Physical Exam   Triage Vital Signs: ED Triage Vitals [12/03/23 1445]  Encounter Vitals Group     BP (!) 153/95     Girls Systolic BP Percentile      Girls Diastolic BP Percentile      Boys Systolic BP Percentile      Boys Diastolic BP Percentile      Pulse Rate (!) 115     Resp 20     Temp 97.8 F (36.6 C)     Temp src      SpO2 96 %     Weight      Height      Head Circumference      Peak Flow      Pain Score 2     Pain Loc      Pain Education      Exclude from Growth Chart     Most recent vital signs: Vitals:   12/03/23 1445  BP: (!) 153/95  Pulse: (!) 115  Resp: 20  Temp: 97.8 F (36.6 C)  SpO2: 96%     General: Awake, no distress.  CV:  Good peripheral perfusion.  Resp:  Normal effort.  Abd:  No distention.  Other:  Small abrasion on the top of his foot good distal pulse.  No tenderness on the foot.  Right hip pain.  Unable to lift the leg up. No chest wall tenderness no abdominal tenderness.  No hematoma to the head.  Full range of motion of neck.  No C-spine tenderness    ED Results / Procedures / Treatments   Labs (all labs ordered are listed, but only abnormal results are displayed) Labs Reviewed  CBC WITH DIFFERENTIAL/PLATELET - Abnormal; Notable for the following components:      Result Value   Hemoglobin 10.6 (*)    HCT 33.6 (*)    MCV 77.8 (*)    MCH 24.5 (*)    RDW 16.6 (*)    All other components within normal limits  COMPREHENSIVE  METABOLIC PANEL WITH GFR - Abnormal; Notable for the following components:   CO2 20 (*)    Glucose, Bld 328 (*)    Creatinine, Ser 1.57 (*)    GFR, Estimated 45 (*)    All other components within normal limits      RADIOLOGY I have reviewed the xray personally and interpreted positive femur fracture   PROCEDURES:  Critical Care performed: No  Procedures   MEDICATIONS ORDERED IN ED: Medications  HYDROmorphone (DILAUDID) injection 0.5 mg (has no administration in time range)  ondansetron  (ZOFRAN ) injection 4 mg (has no administration in time range)  Tdap (BOOSTRIX) injection 0.5 mL (has no administration in time range)     IMPRESSION / MDM / ASSESSMENT AND PLAN / ED COURSE  I reviewed the triage vital signs and the nursing notes.   Patient's presentation  is most consistent with acute presentation with potential threat to life or bodily function.   Patient comes in with concerns for mechanical fall onto right hip.  Exam concerning for fracture.  Neurovascularly intact.  He denies any trauma to his head or any neck pain.  Preop labs were ordered and preop chest x-ray.  Patient given some Dilaudid for pain  CMP shows creatinine that is stable.  Elevated glucose but no anion gap elevation  CBC reassuring  Discussed with Dr. Lydia Sams and will admit to the hospital team given x-ray confirms femur fracture    FINAL CLINICAL IMPRESSION(S) / ED DIAGNOSES   Final diagnoses:  Closed fracture of left femur, unspecified fracture morphology, unspecified portion of femur, initial encounter (HCC)     Rx / DC Orders   ED Discharge Orders     None        Note:  This document was prepared using Dragon voice recognition software and may include unintentional dictation errors.   Lubertha Rush, MD 12/03/23 [phone]

## 2023-12-04 ENCOUNTER — Encounter: Payer: Self-pay | Admitting: Internal Medicine

## 2023-12-04 ENCOUNTER — Encounter
Admission: EM | Disposition: A | Discharge status: Home Health | Payer: Self-pay | Source: Home / Self Care | Attending: Internal Medicine

## 2023-12-04 ENCOUNTER — Inpatient Hospital Stay: Admitting: Anesthesiology

## 2023-12-04 ENCOUNTER — Inpatient Hospital Stay

## 2023-12-04 DIAGNOSIS — S72001A Fracture of unspecified part of neck of right femur, initial encounter for closed fracture: Secondary | ICD-10-CM | POA: Diagnosis not present

## 2023-12-04 LAB — COMPREHENSIVE METABOLIC PANEL WITH GFR
ALT: 13 U/L (ref 0–44)
AST: 19 U/L (ref 15–41)
Albumin: 3.7 g/dL (ref 3.5–5.0)
Alkaline Phosphatase: 81 U/L (ref 38–126)
Anion gap: 7 (ref 5–15)
BUN: 17 mg/dL (ref 8–23)
CO2: 22 mmol/L (ref 22–32)
Calcium: 8.8 mg/dL — ABNORMAL LOW (ref 8.9–10.3)
Chloride: 105 mmol/L (ref 98–111)
Creatinine, Ser: 1.22 mg/dL (ref 0.61–1.24)
GFR, Estimated: >60 mL/min (ref >60)
Glucose, Bld: 302 mg/dL — ABNORMAL HIGH (ref 70–99)
Potassium: 4.8 mmol/L (ref 3.5–5.1)
Sodium: 134 mmol/L — ABNORMAL LOW (ref 135–145)
Total Bilirubin: 1.4 mg/dL — ABNORMAL HIGH (ref 0.0–1.2)
Total Protein: 6.5 g/dL (ref 6.5–8.1)

## 2023-12-04 LAB — TYPE AND SCREEN
ABO/RH(D): O POS
Antibody Screen: NEGATIVE

## 2023-12-04 LAB — GLUCOSE, CAPILLARY
Glucose-Capillary: 286 mg/dL — ABNORMAL HIGH (ref 70–99)
Glucose-Capillary: 275 mg/dL — ABNORMAL HIGH (ref 70–99)
Glucose-Capillary: 169 mg/dL — ABNORMAL HIGH (ref 70–99)
Glucose-Capillary: 135 mg/dL — ABNORMAL HIGH (ref 70–99)
Glucose-Capillary: 159 mg/dL — ABNORMAL HIGH (ref 70–99)
Glucose-Capillary: 226 mg/dL — ABNORMAL HIGH (ref 70–99)
Glucose-Capillary: 252 mg/dL — ABNORMAL HIGH (ref 70–99)

## 2023-12-04 SURGERY — FIXATION, FEMUR, NECK, PERCUTANEOUS, USING SCREW
Anesthesia: General | Site: Hip | Laterality: Right

## 2023-12-04 MED ORDER — BISACODYL 10 MG RE SUPP: 10.0000 mg | Freq: Every day | RECTAL | Status: DC | PRN

## 2023-12-04 MED ORDER — DEXAMETHASONE SODIUM PHOSPHATE 10 MG/ML IJ SOLN
INTRAMUSCULAR | Status: DC | PRN
  Administered 2023-12-04: 5 mg via INTRAVENOUS

## 2023-12-04 MED ORDER — CEFAZOLIN SODIUM-DEXTROSE 2-3 GM-%(50ML) IV SOLR
INTRAVENOUS | Status: DC | PRN
  Administered 2023-12-04: 2 g via INTRAVENOUS

## 2023-12-04 MED ORDER — 0.9 % SODIUM CHLORIDE (POUR BTL) OPTIME
TOPICAL | Status: DC | PRN
  Administered 2023-12-04: 500 mL

## 2023-12-04 MED ORDER — PHENYLEPHRINE 80 MCG/ML (10ML) SYRINGE FOR IV PUSH (FOR BLOOD PRESSURE SUPPORT)
PREFILLED_SYRINGE | INTRAVENOUS | Status: DC | PRN
  Administered 2023-12-04 (×2): 160 ug via INTRAVENOUS

## 2023-12-04 MED ORDER — METOCLOPRAMIDE HCL 5 MG PO TABS: 5.0000 mg | ORAL_TABLET | Freq: Three times a day (TID) | ORAL | Status: DC | PRN

## 2023-12-04 MED ORDER — CEFAZOLIN SODIUM-DEXTROSE 2-4 GM/100ML-% IV SOLN
INTRAVENOUS | Status: AC
  Filled 2023-12-04: qty 100

## 2023-12-04 MED ORDER — FENTANYL CITRATE (PF) 100 MCG/2ML IJ SOLN
INTRAMUSCULAR | Status: DC | PRN
  Administered 2023-12-04: 100 ug via INTRAVENOUS

## 2023-12-04 MED ORDER — INSULIN ASPART 100 UNIT/ML IJ SOLN: 0.0000 [IU] | Freq: Every day | INTRAMUSCULAR | Status: DC

## 2023-12-04 MED ORDER — ONDANSETRON HCL 4 MG PO TABS: 4.0000 mg | ORAL_TABLET | Freq: Four times a day (QID) | ORAL | Status: DC | PRN

## 2023-12-04 MED ORDER — ROCURONIUM BROMIDE 100 MG/10ML IV SOLN
INTRAVENOUS | Status: DC | PRN
  Administered 2023-12-04: 60 mg via INTRAVENOUS

## 2023-12-04 MED ORDER — SENNOSIDES-DOCUSATE SODIUM 8.6-50 MG PO TABS: 1.0000 | ORAL_TABLET | Freq: Every evening | ORAL | Status: DC | PRN

## 2023-12-04 MED ORDER — BUPIVACAINE LIPOSOME 1.3 % IJ SUSP
INTRAMUSCULAR | Status: AC
  Filled 2023-12-04: qty 20

## 2023-12-04 MED ORDER — OXYCODONE HCL 5 MG PO TABS: 2.5000 mg | ORAL_TABLET | ORAL | Status: DC | PRN

## 2023-12-04 MED ORDER — SODIUM CHLORIDE (PF) 0.9 % IJ SOLN
INTRAMUSCULAR | Status: AC
  Filled 2023-12-04: qty 50

## 2023-12-04 MED ORDER — OXYCODONE HCL 5 MG/5ML PO SOLN: 5.0000 mg | Freq: Once | ORAL | Status: DC | PRN

## 2023-12-04 MED ORDER — BUPIVACAINE HCL (PF) 0.5 % IJ SOLN
INTRAMUSCULAR | Status: AC
  Filled 2023-12-04: qty 30

## 2023-12-04 MED ORDER — FENTANYL CITRATE (PF) 100 MCG/2ML IJ SOLN: 25.0000 ug | INTRAMUSCULAR | Status: DC | PRN

## 2023-12-04 MED ORDER — LACTATED RINGERS IV SOLN: INTRAVENOUS | Status: DC | PRN

## 2023-12-04 MED ORDER — OXYCODONE HCL 5 MG PO TABS
ORAL_TABLET | ORAL | Status: AC
Start: 2023-12-04 — End: 2023-12-04
  Filled 2023-12-04: qty 2

## 2023-12-04 MED ORDER — FENTANYL CITRATE (PF) 100 MCG/2ML IJ SOLN
INTRAMUSCULAR | Status: AC
Start: 2023-12-04 — End: 2023-12-04
  Filled 2023-12-04: qty 2

## 2023-12-04 MED ORDER — ENOXAPARIN SODIUM 40 MG/0.4ML IJ SOSY
40.0000 mg | PREFILLED_SYRINGE | INTRAMUSCULAR | Status: DC
Start: 2023-12-05 — End: 2023-12-05
  Administered 2023-12-05: 40 mg via SUBCUTANEOUS
  Filled 2023-12-04: qty 0.4

## 2023-12-04 MED ORDER — METOCLOPRAMIDE HCL 5 MG/ML IJ SOLN: 5.0000 mg | Freq: Three times a day (TID) | INTRAMUSCULAR | Status: DC | PRN

## 2023-12-04 MED ORDER — DOCUSATE SODIUM 100 MG PO CAPS
100.0000 mg | ORAL_CAPSULE | Freq: Two times a day (BID) | ORAL | Status: DC
  Administered 2023-12-05: 100 mg via ORAL
  Filled 2023-12-04 (×2): qty 1

## 2023-12-04 MED ORDER — ONDANSETRON HCL 4 MG/2ML IJ SOLN
4.0000 mg | Freq: Four times a day (QID) | INTRAMUSCULAR | Status: DC | PRN
  Administered 2023-12-04: 4 mg via INTRAVENOUS
  Filled 2023-12-04: qty 2

## 2023-12-04 MED ORDER — LIDOCAINE HCL (CARDIAC) PF 100 MG/5ML IV SOSY
PREFILLED_SYRINGE | INTRAVENOUS | Status: DC | PRN
  Administered 2023-12-04: 100 mg via INTRAVENOUS

## 2023-12-04 MED ORDER — INSULIN ASPART 100 UNIT/ML IJ SOLN
0.0000 [IU] | Freq: Three times a day (TID) | INTRAMUSCULAR | Status: DC
  Administered 2023-12-05 (×2): 3 [IU] via SUBCUTANEOUS
  Filled 2023-12-04 (×4): qty 1

## 2023-12-04 MED ORDER — OXYCODONE HCL 5 MG PO TABS: 5.0000 mg | ORAL_TABLET | ORAL | Status: DC | PRN

## 2023-12-04 MED ORDER — ACETAMINOPHEN 10 MG/ML IV SOLN
INTRAVENOUS | Status: DC | PRN
  Administered 2023-12-04: 1000 mg via INTRAVENOUS

## 2023-12-04 MED ORDER — PROPOFOL 10 MG/ML IV BOLUS
INTRAVENOUS | Status: DC | PRN
  Administered 2023-12-04: 200 mg via INTRAVENOUS

## 2023-12-04 MED ORDER — METHOCARBAMOL 1000 MG/10ML IJ SOLN: 500.0000 mg | Freq: Four times a day (QID) | INTRAMUSCULAR | Status: DC | PRN

## 2023-12-04 MED ORDER — ONDANSETRON HCL 4 MG/2ML IJ SOLN
4.0000 mg | Freq: Four times a day (QID) | INTRAMUSCULAR | Status: DC | PRN
  Filled 2023-12-04: qty 2

## 2023-12-04 MED ORDER — CEFAZOLIN SODIUM-DEXTROSE 2-4 GM/100ML-% IV SOLN
2.0000 g | Freq: Four times a day (QID) | INTRAVENOUS | Status: AC
  Administered 2023-12-05 (×2): 2 g via INTRAVENOUS
  Filled 2023-12-04 (×3): qty 100

## 2023-12-04 MED ORDER — INSULIN GLARGINE 100 UNITS/ML SOLOSTAR PEN: 10.0000 [IU] | PEN_INJECTOR | Freq: Two times a day (BID) | SUBCUTANEOUS | Status: DC

## 2023-12-04 MED ORDER — KETOROLAC TROMETHAMINE 15 MG/ML IJ SOLN
7.5000 mg | Freq: Four times a day (QID) | INTRAMUSCULAR | Status: DC
Start: 2023-12-04 — End: 2023-12-05
  Administered 2023-12-05: 7.5 mg via INTRAVENOUS
  Filled 2023-12-04 (×3): qty 1

## 2023-12-04 MED ORDER — ONDANSETRON HCL 4 MG/2ML IJ SOLN
INTRAMUSCULAR | Status: AC
  Filled 2023-12-04: qty 2

## 2023-12-04 MED ORDER — SUGAMMADEX SODIUM 200 MG/2ML IV SOLN
INTRAVENOUS | Status: DC | PRN
  Administered 2023-12-04: 200 mg via INTRAVENOUS

## 2023-12-04 MED ORDER — INSULIN GLARGINE-YFGN 100 UNIT/ML ~~LOC~~ SOLN
10.0000 [IU] | Freq: Two times a day (BID) | SUBCUTANEOUS | Status: DC
  Administered 2023-12-05: 10 [IU] via SUBCUTANEOUS
  Filled 2023-12-04 (×4): qty 0.1

## 2023-12-04 MED ORDER — HYDROMORPHONE HCL 1 MG/ML IJ SOLN: 0.2000 mg | INTRAMUSCULAR | Status: DC | PRN

## 2023-12-04 MED ORDER — ACETAMINOPHEN 500 MG PO TABS
1000.0000 mg | ORAL_TABLET | Freq: Three times a day (TID) | ORAL | Status: DC
  Administered 2023-12-05 (×2): 1000 mg via ORAL
  Filled 2023-12-04 (×4): qty 2

## 2023-12-04 MED ORDER — OXYCODONE HCL 5 MG PO TABS: 5.0000 mg | ORAL_TABLET | Freq: Once | ORAL | Status: DC | PRN

## 2023-12-04 MED ORDER — BUPIVACAINE LIPOSOME 1.3 % IJ SUSP
INTRAMUSCULAR | Status: DC | PRN
  Administered 2023-12-04: 30 mL

## 2023-12-04 MED ORDER — TRAMADOL HCL 50 MG PO TABS
50.0000 mg | ORAL_TABLET | Freq: Four times a day (QID) | ORAL | Status: DC | PRN
  Filled 2023-12-04: qty 1

## 2023-12-04 MED ORDER — FLEET ENEMA RE ENEM: 1.0000 | ENEMA | Freq: Once | RECTAL | Status: DC | PRN

## 2023-12-04 MED ORDER — METHOCARBAMOL 500 MG PO TABS: 500.0000 mg | ORAL_TABLET | Freq: Four times a day (QID) | ORAL | Status: DC | PRN

## 2023-12-04 MED ORDER — PHENYLEPHRINE HCL-NACL 20-0.9 MG/250ML-% IV SOLN
INTRAVENOUS | Status: DC | PRN
Start: 2023-12-04 — End: 2023-12-04
  Administered 2023-12-04: 20 ug/min via INTRAVENOUS

## 2023-12-04 MED ORDER — DEXAMETHASONE SODIUM PHOSPHATE 10 MG/ML IJ SOLN
INTRAMUSCULAR | Status: AC
  Filled 2023-12-04: qty 1

## 2023-12-04 MED ORDER — PHENYLEPHRINE HCL-NACL 20-0.9 MG/250ML-% IV SOLN
INTRAVENOUS | Status: AC
  Filled 2023-12-04: qty 250

## 2023-12-04 SURGICAL SUPPLY — 35 items
BIT DRILL CANN QC 4.9 LRG (BIT) IMPLANT
BLADE SURG 15 STRL LF DISP TIS (BLADE) ×1 IMPLANT
CHLORAPREP W/TINT 26 (MISCELLANEOUS) ×1 IMPLANT
DRAPE SHEET LG 3/4 BI-LAMINATE (DRAPES) ×1 IMPLANT
DRAPE SURG 17X11 SM STRL (DRAPES) ×2 IMPLANT
DRAPE U-SHAPE 47X51 STRL (DRAPES) ×2 IMPLANT
DRSG OPSITE POSTOP 3X4 (GAUZE/BANDAGES/DRESSINGS) ×1 IMPLANT
ELECTRODE REM PT RTRN 9FT ADLT (ELECTROSURGICAL) ×1 IMPLANT
GAUZE XEROFORM 1X8 LF (GAUZE/BANDAGES/DRESSINGS) ×1 IMPLANT
GLOVE BIOGEL PI IND STRL 8 (GLOVE) ×1 IMPLANT
GLOVE SURG SYN 7.5 E (GLOVE) ×2 IMPLANT
GLOVE SURG SYN 7.5 PF PI (GLOVE) ×2 IMPLANT
GOWN SRG LRG LVL 4 IMPRV REINF (GOWNS) ×1 IMPLANT
GOWN SRG XL LONG LVL 3 NONREIN (GOWNS) ×1 IMPLANT
GUIDEWIRE THRD ASNIS 3.2X300 (WIRE) IMPLANT
KIT TURNOVER CYSTO (KITS) ×1 IMPLANT
MANIFOLD NEPTUNE II (INSTRUMENTS) ×1 IMPLANT
MAT ABSORB FLUID 56X50 GRAY (MISCELLANEOUS) ×2 IMPLANT
NDL FILTER BLUNT 18X1 1/2 (NEEDLE) ×1 IMPLANT
NDL HYPO 22X1.5 SAFETY MO (MISCELLANEOUS) ×1 IMPLANT
NEEDLE FILTER BLUNT 18X1 1/2 (NEEDLE) ×1 IMPLANT
NEEDLE HYPO 22X1.5 SAFETY MO (MISCELLANEOUS) ×1 IMPLANT
NS IRRIG 500ML POUR BTL (IV SOLUTION) ×1 IMPLANT
PACK HIP COMPR (MISCELLANEOUS) ×1 IMPLANT
SCREW ASNIS 100MM (Screw) IMPLANT
SCREW ASNIS 95MM (Screw) IMPLANT
STAPLER SKIN PROX 35W (STAPLE) ×1 IMPLANT
SUT VIC AB 0 CT1 36 (SUTURE) ×1 IMPLANT
SUT VIC AB 2-0 CT1 TAPERPNT 27 (SUTURE) ×1 IMPLANT
SYR 30ML LL (SYRINGE) ×1 IMPLANT
SYR 5ML LL (SYRINGE) ×1 IMPLANT
TAPE CLOTH 3X10 WHT NS LF (GAUZE/BANDAGES/DRESSINGS) ×1 IMPLANT
TRAP FLUID SMOKE EVACUATOR (MISCELLANEOUS) ×1 IMPLANT
WASHER ORTH CANN SS SCR (Washer) IMPLANT
WATER STERILE IRR 500ML POUR (IV SOLUTION) ×1 IMPLANT

## 2023-12-04 NOTE — Anesthesia Preprocedure Evaluation (Addendum)
 Anesthesia Evaluation  Patient identified by MRN, date of birth, ID band Patient awake    Reviewed: Allergy & Precautions, NPO status , Patient's Chart, lab work & pertinent test results  History of Anesthesia Complications Negative for: history of anesthetic complications  Airway Mallampati: III  TM Distance: >3 FB Neck ROM: full    Dental no notable dental hx.    Pulmonary neg pulmonary ROS   Pulmonary exam normal        Cardiovascular hypertension, On Medications Normal cardiovascular exam     Neuro/Psych  PSYCHIATRIC DISORDERS  Depression    negative neurological ROS     GI/Hepatic Neg liver ROS,GERD  ,,  Endo/Other  diabetes, Well Controlled, Type 2    Renal/GU Renal disease     Musculoskeletal   Abdominal   Peds  Hematology  (+) Blood dyscrasia, anemia   Anesthesia Other Findings Past Medical History: No date: Allergy No date: Barrett's esophagus No date: Diabetes mellitus without complication (HCC) No date: Esophagitis     Comment:  LA grade C 05/20/2014: Essential hypertension     Comment:  Formatting of this note might be different from the               original.  Last Assessment & Plan:   BP is well               controlled.   09/21/2014: Failure of erection No date: GERD (gastroesophageal reflux disease) No date: Hyperlipidemia No date: Hypertension 11/11/2017: Iron deficiency anemia No date: Kidney disease, chronic, stage III (GFR 30-59 ml/min) (HCC) 12/13/2021: Pain in joint of left knee 09/12/2021: Pain of left forearm 05/30/2021: Potential for cognitive impairment 03/07/2021: Secondary hyperparathyroidism of renal origin [facility])  Past Surgical History: No date: CARPAL TUNNEL RELEASE; Right 10/26/2012: COLONOSCOPY     Comment:  repeat in 10 years MUS 02/08/2019: COLONOSCOPY WITH PROPOFOL ; N/A     Comment:  Procedure: COLONOSCOPY WITH PROPOFOL ;  Surgeon:               Deveron Fly,  MD;  [facility] END[facility]COPY;                Service: Endoscopy;  Laterality: N/A; 06/14/2022: COLONOSCOPY WITH PROPOFOL ; N/A     Comment:  Procedure: COLONOSCOPY WITH PROPOFOL ;  Surgeon:               Shane Darling, MD;  Location: [facility] ENDOSCOPY;                Service: Endoscopy;  Laterality: N/A; 10/22/2016: ESOPHAGOGASTRODUODENOSCOPY (EGD) WITH PROPOFOL ; N/A     Comment:  Procedure: ESOPHAGOGASTRODUODENOSCOPY (EGD) WITH               PROPOFOL ;  Surgeon: Deveron Fly, MD;  Location:               [facility] ENDOSCOPY;  Service: Endoscopy;  Laterality: N/A; 07/14/2018: ESOPHAGOGASTRODUODENOSCOPY (EGD) WITH PROPOFOL ; N/A     Comment:  Procedure: ESOPHAGOGASTRODUODENOSCOPY (EGD) WITH               PROPOFOL ;  Surgeon: Marnee Sink, MD;  Location: [facility]               ENDOSCOPY;  Service: Endoscopy;  Laterality: N/A; 06/14/2022: ESOPHAGOGASTRODUODENOSCOPY (EGD) WITH PROPOFOL ; N/A     Comment:  Procedure: ESOPHAGOGASTRODUODENOSCOPY (EGD) WITH               PROPOFOL ;  Surgeon: Shane Darling, MD;  Location:  [facility] ENDOSCOPY;  Service: Endoscopy;  Laterality: N/A; No date: FRACTURE SURGERY; Left     Comment:  arm 04/16/2018: SHOULDER ARTHROSCOPY WITH SUBACROMIAL DECOMPRESSION AND  BICEP TENDON REPAIR; Right     Comment:  Procedure: ARTHROSCOPIC  BICEP TENOTOMY;  Surgeon:               Rande Bushy, MD;  Location: [facility] ORS;  Service:               Orthopedics;  Laterality: Right; 6/15 ue to repeat in 1 year: UPPER ESOPHAGEAL ENDOSCOPIC ULTRASOUND  (EUS)  BMI    Body Mass Index: 28.56 kg/m      Reproductive/Obstetrics negative OB ROS                             Anesthesia Physical Anesthesia Plan  ASA: 3  Anesthesia Plan: General ETT   Post-op Pain Management: Ofirmev  IV (intra-op)*, Toradol IV (intra-op)* and Ketamine IV*   Induction: Intravenous  PONV Risk Score and Plan: Ondansetron , Dexamethasone , Midazolam  and Treatment may  vary due to age or medical condition  Airway Management Planned: Oral ETT  Additional Equipment:   Intra-op Plan:   Post-operative Plan: Extubation in OR  Informed Consent: I have reviewed the patients History and Physical, chart, labs and discussed the procedure including the risks, benefits and alternatives for the proposed anesthesia with the patient or authorized representative who has indicated his/her understanding and acceptance.     Dental Advisory Given  Plan Discussed with: Anesthesiologist, CRNA and Surgeon  Anesthesia Plan Comments: (Patient consented for risks of anesthesia including but not limited to:  - adverse reactions to medications - damage to eyes, teeth, lips or other oral mucosa - nerve damage due to positioning  - sore throat or hoarseness - Damage to heart, brain, nerves, lungs, other parts of body or loss of life  Patient voiced understanding and assent.)       Anesthesia Quick Evaluation

## 2023-12-04 NOTE — H&P (Signed)
 H&P reviewed. No significant changes noted.

## 2023-12-04 NOTE — Inpatient Diabetes Management (Signed)
 Inpatient Diabetes Program Recommendations  AACE/ADA: New Consensus Statement on Inpatient Glycemic Control (2015)  Target Ranges:  Prepandial:   less than 140 mg/dL      Peak postprandial:   less than 180 mg/dL (1-2 hours)      Critically ill patients:  140 - 180 mg/dL    Latest Reference Range & Units 07/24/23 08:45  Hemoglobin A1C 4.0 - 5.6 % -  7.5 ! Pend  !: Data is abnormal  Latest Reference Range & Units 12/04/23 06:40  Glucose-Capillary 70 - 99 mg/dL 161 (H)  (H): Data is abnormally high    Admit with: Acute nondisplaced right femoral neck fracture   History: DM2, CKD3  Home DM Meds: Jardiance  25 mg daily       Amaryl  1-2 mg BID       Metformin  1000 mg BID  Current Orders: None    MD- Please add Novolog Moderate Correction Scale/ SSI (0-15 units) Q4 hours    --Will follow patient during hospitalization--  Langston Pippins RN, MSN, CDCES Diabetes Coordinator Inpatient Glycemic Control Team Team Pager: [phone] (8a-5p)

## 2023-12-04 NOTE — Transfer of Care (Signed)
 Immediate Anesthesia Transfer of Care Note  Patient: Paul Spears  Procedure(s) Performed: FIXATION, FEMUR, NECK, PERCUTANEOUS, USING SCREW (Right: Hip)  Patient Location: PACU  Anesthesia Type:General  Level of Consciousness: awake and alert   Airway & Oxygen Therapy: Patient Spontanous Breathing and Patient connected to face mask oxygen  Post-op Assessment: Report given to RN and Post -op Vital signs reviewed and stable  Post vital signs: stable  Last Vitals:  Vitals Value Taken Time  BP 142/75 12/04/23 15:13  Temp    Pulse 94 12/04/23 15:16  Resp 14 12/04/23 15:16  SpO2 94 % 12/04/23 15:16  Vitals shown include unfiled device data.  Last Pain:  Vitals:   12/04/23 1331  TempSrc: Temporal  PainSc: 8          Complications: No notable events documented.

## 2023-12-04 NOTE — Plan of Care (Signed)
   Problem: Education: Goal: Knowledge of General Education information will improve Description Including pain rating scale, medication(s)/side effects and non-pharmacologic comfort measures Outcome: Progressing

## 2023-12-04 NOTE — Anesthesia Procedure Notes (Signed)
 Procedure Name: Intubation Date/Time: 12/04/2023 2:05 PM  Performed by: Wilkins Hardy I, CRNAPre-anesthesia Checklist: Patient identified, Emergency Drugs available, Suction available and Patient being monitored Patient Re-evaluated:Patient Re-evaluated prior to induction Oxygen Delivery Method: Circle system utilized Preoxygenation: Pre-oxygenation with 100% oxygen Induction Type: IV induction Ventilation: Mask ventilation without difficulty Laryngoscope Size: Robertshaw and 4 Grade View: Grade I Tube type: Oral Tube size: 7.5 mm Number of attempts: 1 Airway Equipment and Method: Stylet and Oral airway Placement Confirmation: ETT inserted through vocal cords under direct vision, positive ETCO2 and breath sounds checked- equal and bilateral Tube secured with: Tape Dental Injury: Teeth and Oropharynx as per pre-operative assessment

## 2023-12-04 NOTE — Op Note (Signed)
 DATE OF SURGERY: 12/04/2023  PREOPERATIVE DIAGNOSIS: Right valgus impacted femoral neck fracture  POSTOPERATIVE DIAGNOSIS: Right valgus impacted femoral neck fracture  PROCEDURE: Percutaneous pinning of Right femoral neck fracture  SURGEON: Cleotilde Dago, MD  ASSISTANTS: none  EBL: 25 cc  COMPONENTS:  Stryker 6.11mm cannulated screws x 3 with washers ( , , 95mm)   INDICATIONS: Paul Spears is a 78 y.o. male who sustained a valgus impacted femoral neck fracture after a fall. Risks and benefits of percutaneous pinning were explained to the patient and family. Risks include but are not limited to bleeding, infection, injury to tissues, nerves, vessels, DVT/PE, malunion/nonunion, hardware failure, and risks of anesthesia. The patient and family understand these risks, have completed an informed consent, and wish to proceed.   PROCEDURE:  The patient was brought into the operating room. After administering spinal anesthesia, the patient was placed in the supine position on the Hana table. The uninjured leg was extended while the injured lower extremity was placed in a neutral position with care taken to not displace the fracture during positioning. The lateral aspects of the operative hip and thigh were prepped with ChloraPrep solution before being draped sterilely. IV antibiotics were administered. A timeout was performed to verify the appropriate surgical site, patient, and procedure.   The greater trochanter was identified and an approximately 5 cm incision was made over the lateral aspect of the proximal femur. The incision was carried down through the subcutaneous tissues to expose the IT band. This was split at the proximal portion of the incision and the vastus lateralis was split in line with its fibers. The lateral aspect of the femur was cleared of soft tissue. Under fluoroscopic guidance, a guidewire was placed along the inferior aspect of the femoral neck into the head while  ensuring the start point on the lateral cortex was not below the level of the lesser trochanter. A parallel guide was used to place two additional guidepins (superior posterior and superior anterior). Position of all pins was verified fluoroscopically in both the AP and lateral views. A measuring device was used the measure appropriate screw length. The guidepins were drilled with a 3.20mm drill. Appropriately sized screws were advanced starting with the inferior screw, then superior posterior, then superior anterior. Screws were sequentially tightened. Hardware position and bony alignment was confirmed fluoroscopically with AP and lateral views.   The wounds were irrigated thoroughly with sterile saline solution. The IT band was closed with 0-Vicryl. The subcutaneous tissues were closed using 2-0 Vicryl interrupted sutures. The skin was closed using staples. Sterile occlusive dressing was applied. The patient was then transferred to the recovery room in satisfactory condition after tolerating the procedure well.  POSTOPERATIVE PLAN: The patient will be WBAT on the operative extremity. Lovenox 40mg /day x 4 weeks to start on POD#1. Ancef  x 24 hours. PT/OT on POD#1.

## 2023-12-04 NOTE — Progress Notes (Signed)
 Progress Note   Patient: Paul Spears ZHY:[id number] DOB: [birth year] DOA: 12/03/2023     1 DOS: the patient was seen and examined on 12/04/2023   Brief hospital course: From HPI Paul Spears is a 78 y.o. male with medical history significant of essential hypertension, GERD, hyperlipidemia, iron deficiency anemia, CKD stage IIIb, hearing impairment who was otherwise well comes in from home lives with son.  According to patient he uses [place] to walk about however he slipped and had a fall landing on his right hip thereby having right hip pain and therefore came to the emergency room for further management.  Pain of intensity 9/10 relieved by pain medication administered in the emergency room. ED course: Temperature 97.8 respiratory rate 20 pulse 115 blood pressure 153/95 saturating 96% on room air. X-ray of the hip obtained showed right sided femoral neck fracture ED physician discussed with Dr. Lydia Sams orthopedic surgeon who agreed for patient to be admitted for surgical intervention tomorrow   Assessment and Plan:  Acute nondisplaced right femoral neck fracture Orthopedic surgeon on board and surgery being planned Continue as needed pain medication Consult PT OT after surgical intervention   Essential hypertension Continue amlodipine    GERD,  Continue famotidine    Hyperlipidemia,  Continue statin therapy   Iron deficiency anemia,  Not on home medication   CKD stage IIIb Monitor renal function closely   DVT prophylaxis-placed on SCD given planned surgery    Advance Care Planning:   Code Status: Not on file DNR   Consults: Orthopedics   Family Communication: Discussed with patient's son present at bedside    Subjective:  Patient admits to improvement in the hip pain Denies nausea vomiting abdominal pain chest pain cough  Physical Exam: Elderly male laying in bed in acute pain Abdomen: Full obese none distended nontender Respiratory: Clear to auscultation  bilaterally Musculoskeletal: Some bruises noted in the lower extremity with tenderness noted to the right hip Cardiovascular: S1 and S2 present no Mamma heard CNS: Alert and oriented x 3 able to move all extremities however movement is limited on the right lower extremity on account of right hip pain and fracture    Vitals:   12/03/23 2016 12/04/23 0505 12/04/23 0801 12/04/23 1331  BP: 134/76 (!) 141/70 134/79 (!) 152/79  Pulse: (!) 107 (!) 101 94 (!) 102  Resp:  18 17 20   Temp: 98.1 F (36.7 C) 97.6 F (36.4 C) 97.7 F (36.5 C) (!) 100.9 F (38.3 C)  TempSrc: Oral  Oral Temporal  SpO2: 94% 95% 97% 96%  Weight: 90.3 kg   90.3 kg  Height: 5' 10 (1.778 m)   5' 10 (1.778 m)    Data Reviewed:    Latest Ref Rng & Units 12/03/2023    2:48 PM 07/24/2023    9:20 AM 04/03/2023    9:21 AM  CBC  WBC 4.0 - 10.5 K/uL 8.8  5.8  6.3   Hemoglobin 13.0 - 17.0 g/dL 29.5  28.4  13.2   Hematocrit 39.0 - 52.0 % 33.6  35.4  36.1   Platelets 150 - 400 K/uL 370  461  361        Latest Ref Rng & Units 12/04/2023    3:56 AM 12/03/2023    2:48 PM 07/24/2023    9:20 AM  BMP  Glucose 70 - 99 mg/dL 440  102  725   BUN 8 - 23 mg/dL 17  17  15    Creatinine 0.61 - 1.24 mg/dL  1.22  1.57  1.43   BUN/Creat Ratio 6 - 22 (calc)   10   Sodium 135 - 145 mmol/L 134  136  137   Potassium 3.5 - 5.1 mmol/L 4.8  5.0  4.8   Chloride 98 - 111 mmol/L 105  108  102   CO2 22 - 32 mmol/L 22  20  23    Calcium  8.9 - 10.3 mg/dL 8.8  9.3  40.9      Disposition: Status is: Inpatient  Time spent: 55 minutes  Author: Ezzard Holms, MD 12/04/2023 2:39 PM  For on call review [email or web address].

## 2023-12-04 NOTE — Plan of Care (Signed)
  Problem: Education: Goal: Knowledge of General Education information will improve Description: Including pain rating scale, medication(s)/side effects and non-pharmacologic comfort measures Outcome: Progressing   Problem: Nutritional: Goal: Maintenance of adequate nutrition will improve Outcome: Progressing

## 2023-12-05 ENCOUNTER — Encounter: Payer: Self-pay | Admitting: Orthopedic Surgery

## 2023-12-05 DIAGNOSIS — S72001A Fracture of unspecified part of neck of right femur, initial encounter for closed fracture: Secondary | ICD-10-CM | POA: Diagnosis not present

## 2023-12-05 LAB — CBC
HCT: 29.5 % — ABNORMAL LOW (ref 39.0–52.0)
Hemoglobin: 9.3 g/dL — ABNORMAL LOW (ref 13.0–17.0)
MCH: 24.3 pg — ABNORMAL LOW (ref 26.0–34.0)
MCHC: 31.5 g/dL (ref 30.0–36.0)
MCV: 77.2 fL — ABNORMAL LOW (ref 80.0–100.0)
Platelets: 265 10*3/uL (ref 150–400)
RBC: 3.82 MIL/uL — ABNORMAL LOW (ref 4.22–5.81)
RDW: 17.1 % — ABNORMAL HIGH (ref 11.5–15.5)
WBC: 10.3 10*3/uL (ref 4.0–10.5)
nRBC: 0 % (ref 0.0–0.2)

## 2023-12-05 LAB — GLUCOSE, CAPILLARY
Glucose-Capillary: 245 mg/dL — ABNORMAL HIGH (ref 70–99)
Glucose-Capillary: 216 mg/dL — ABNORMAL HIGH (ref 70–99)

## 2023-12-05 LAB — BASIC METABOLIC PANEL WITH GFR
Anion gap: 12 (ref 5–15)
BUN: 22 mg/dL (ref 8–23)
CO2: 20 mmol/L — ABNORMAL LOW (ref 22–32)
Calcium: 8.6 mg/dL — ABNORMAL LOW (ref 8.9–10.3)
Chloride: 103 mmol/L (ref 98–111)
Creatinine, Ser: 1.47 mg/dL — ABNORMAL HIGH (ref 0.61–1.24)
GFR, Estimated: 49 mL/min — ABNORMAL LOW (ref >60)
Glucose, Bld: 332 mg/dL — ABNORMAL HIGH (ref 70–99)
Potassium: 4.6 mmol/L (ref 3.5–5.1)
Sodium: 135 mmol/L (ref 135–145)

## 2023-12-05 MED ORDER — TRAMADOL HCL 50 MG PO TABS: 50.0000 mg | ORAL_TABLET | Freq: Four times a day (QID) | ORAL | 0 refills | Status: DC | PRN

## 2023-12-05 MED ORDER — DOCUSATE SODIUM 100 MG PO CAPS: 100.0000 mg | ORAL_CAPSULE | Freq: Two times a day (BID) | ORAL | 0 refills | Status: DC

## 2023-12-05 MED ORDER — BISACODYL 10 MG RE SUPP
10.0000 mg | Freq: Every day | RECTAL | 0 refills | Status: DC | PRN
Start: 2023-12-05 — End: 2024-04-15

## 2023-12-05 MED ORDER — OXYCODONE HCL 5 MG PO TABS: 2.5000 mg | ORAL_TABLET | Freq: Four times a day (QID) | ORAL | 0 refills | Status: DC | PRN

## 2023-12-05 MED ORDER — IBUPROFEN 400 MG PO TABS
200.0000 mg | ORAL_TABLET | Freq: Once | ORAL | Status: AC
  Administered 2023-12-05: 200 mg via ORAL
  Filled 2023-12-05: qty 1

## 2023-12-05 MED ORDER — ENOXAPARIN SODIUM 40 MG/0.4ML IJ SOSY: 40.0000 mg | PREFILLED_SYRINGE | INTRAMUSCULAR | 0 refills | Status: DC

## 2023-12-05 MED ORDER — AMLODIPINE BESYLATE 5 MG PO TABS
5.0000 mg | ORAL_TABLET | Freq: Every day | ORAL | 0 refills | Status: DC
Start: 2023-12-05 — End: 2024-02-03

## 2023-12-05 NOTE — Progress Notes (Signed)
 Patient discharging home, all belongings sent with patient. IV removed. Discharge education and teaching provided to patient and son. All questions answered.

## 2023-12-05 NOTE — TOC Transition Note (Signed)
 Transition of Care [facility]) - Discharge Note   Patient Details  Name: Paul Spears MRN: [id number] Date of Birth: [birth year]  [facility] CM/SW Contact:  Alexandra Ice, RN Phone Number: 12/05/2023, 11:45 AM   Clinical Narrative:     Spoke with patient, TOC explained role and purpose of visit. He declines home health services. He is agreeable to rolling walker. No preference on agency to use for DME. Sent referral to [facility] with [employer] for processing. Notified him patient to discharge today. DME will be delivered to bedside.   Final next level of care: Home/Self Care Barriers to Discharge: Barriers Resolved   Patient Goals and CMS Choice Patient states their goals for this hospitalization and ongoing recovery are:: go home CMS Medicare.gov Compare Post Acute Care list provided to:: Patient Choice offered to / list presented to : Patient      Discharge Placement                    Patient and family notified of of transfer: 12/05/23  Discharge Plan and Services Additional resources added to the After Visit Summary for       Post Acute Care Choice: Skilled Nursing Facility, Home Health          DME Arranged: Otho Blitz rolling DME Agency: [employer] Date DME Agency Contacted: 12/05/23 Time DME Agency Contacted: 1145 Representative spoke with at DME Agency: Harriet Limber HH Arranged: NA          Social Drivers of Health (SDOH) Interventions SDOH Screenings   Food Insecurity: No Food Insecurity (12/03/2023)  Housing: Low Risk  (12/03/2023)  Transportation Needs: No Transportation Needs (12/03/2023)  Utilities: Not At Risk (12/03/2023)  Alcohol Screen: Low Risk  (01/28/2020)  Depression (PHQ2-9): Low Risk  (07/24/2023)  Financial Resource Strain: Low Risk  (06/19/2018)  Physical Activity: Inactive (06/19/2018)  Social Connections: Moderately Integrated (12/03/2023)  Stress: No Stress Concern Present (06/22/2019)  Tobacco Use: Low Risk  (12/04/2023)      Readmission Risk Interventions     No data to display

## 2023-12-05 NOTE — Progress Notes (Signed)
 Occupational Therapy Evaluation Patient Details Name: Paul Spears MRN: [id number] DOB: [birth year] Today's Date: 12/05/2023   History of Present Illness   Paul HAMAD is a 78 y.o. male with medical history significant of essential hypertension, GERD, hyperlipidemia, iron deficiency anemia, CKD stage IIIb, hearing impairment who was otherwise well comes in from home lives with son.S/p closed right hip fracture post op day #1.     Clinical Impressions Pt was seen for OT evaluation this date. Pt is alert and oriented x4 with son at bedside. Prior to hospital admission, pt was indep in ADL/IADLs. Pt lives with his two sons who work full time. Pt's home is a one level house with 3 steps to enter. Pt presents to acute OT demonstrating impaired ADL performance and functional mobility 2/2 (See OT problem list for additional functional deficits). Pt currently requires MODI for bed mobility, MINA for LB dressing (son/author assisted pt at bedside). Pt very eager to complete tasks on his own, slightly impulsive during ADLs/mobility. Pt amb within the hallway ~1107ft with use of RW + CGA for safety. Pt reports no pain at rest, 6/10 during mobility. Pt completed sink level grooming tasks with CGA. Pt retired in [employer] with all needs in reach. Pt would benefit from skilled OT services to address noted impairments and functional limitations (see below for any additional details) in order to maximize safety and independence while minimizing falls risk and caregiver burden. OT will follow acutely.     If plan is discharge home, recommend the following:   A little help with walking and/or transfers;Assistance with cooking/housework     Functional Status Assessment   Patient has had a recent decline in their functional status and demonstrates the ability to make significant improvements in function in a reasonable and predictable amount of time.     Equipment Recommendations   Other (comment)  (Rolling walker)     Recommendations for Other Services         Precautions/Restrictions   Precautions Precautions: Fall Recall of Precautions/Restrictions: Intact Restrictions Weight Bearing Restrictions Per Provider Order: Yes RLE Weight Bearing Per Provider Order: Weight bearing as tolerated     Mobility Bed Mobility Overal bed mobility: Modified Independent             General bed mobility comments: No physical assistance    Transfers Overall transfer level: Needs assistance Equipment used: Rolling walker (2 wheels) Transfers: Sit to/from Stand Sit to Stand: Contact guard assist, Supervision           General transfer comment: CGA for initial STS, progressed to supervision      Balance Overall balance assessment: Needs assistance Sitting-balance support: Feet supported, No upper extremity supported Sitting balance-Leahy Scale: Good Sitting balance - Comments: Steady reaching within BOS   Standing balance support: During functional activity, Reliant on assistive device for balance, Bilateral upper extremity supported Standing balance-Leahy Scale: Fair Standing balance comment: Mild unsteady, not used to RW                           ADL either performed or assessed with clinical judgement   ADL Overall ADL's : Needs assistance/impaired     Grooming: Oral care;Wash/dry face;Standing;Contact guard assist Grooming Details (indicate cue type and reason): Sink level             Lower Body Dressing: Minimal assistance;Sit to/from stand Lower Body Dressing Details (indicate cue type and reason): MINA provided by son/author  for donning socks and pants Toilet Transfer: Ambulation;Rolling walker (2 wheels);Contact guard assist;Cueing for safety (Simulated toilet t/f)           Functional mobility during ADLs: Contact guard assist;Rolling walker (2 wheels) General ADL Comments: Completes standing ADLs wtih CGA, seated ADL with  supervision      Pertinent Vitals/Pain Pain Assessment Pain Assessment: 0-10 Pain Score: 6  (With mobility, no pain at rest) Pain Location: R hip Pain Descriptors / Indicators: Aching Pain Intervention(s): Limited activity within patient's tolerance, Monitored during session     Extremity/Trunk Assessment Upper Extremity Assessment Upper Extremity Assessment: Overall WFL for tasks assessed   Lower Extremity Assessment Lower Extremity Assessment: RLE deficits/detail RLE Deficits / Details: Anticipated deficits with closed right hip fx   Cervical / Trunk Assessment Cervical / Trunk Assessment: Kyphotic   Communication Communication Communication: Impaired Factors Affecting Communication: Hearing impaired   Cognition Arousal: Alert Behavior During Therapy: WFL for tasks assessed/performed Cognition: No apparent impairments             OT - Cognition Comments: A/Ox4                 Following commands: Impaired Following commands impaired: Follows multi-step commands with increased time     Cueing  General Comments   Cueing Techniques: Verbal cues;Tactile cues  Eager to progress on this date   Exercises Exercises: Other exercises Other Exercises Other Exercises: Edu: Role of OT eval, safe ADL completion, DME management throughout ADL completion   Shoulder Instructions      Home Living Family/patient expects to be discharged to:: Private residence Living Arrangements: Alone;Children Available Help at Discharge: Family;Other (Comment) (Both work full time, alone most of the day) Type of Home: House Home Access: Stairs to enter [employer] of Steps: 3   Home Layout: One level     Bathroom Shower/Tub: Chief Strategy Officer: Standard     Home Equipment: None          Prior Functioning/Environment Prior Level of Function : Independent/Modified Independent;Driving;History of Falls (last six months)              Mobility Comments: Indep no DME PTA ADLs Comments: Indep IADL/ADLs    OT Problem List: Decreased range of motion;Decreased strength;Decreased activity tolerance;Impaired balance (sitting and/or standing);Decreased safety awareness;Decreased knowledge of use of DME or AE   OT Treatment/Interventions: Self-care/ADL training;Therapeutic exercise;Energy conservation;Therapeutic activities;Patient/family education;Balance training;DME and/or AE instruction      OT Goals(Current goals can be found in the care plan section)   Acute Rehab OT Goals Patient Stated Goal: Return home OT Goal Formulation: With patient/family Time For Goal Achievement: 12/19/23 Potential to Achieve Goals: Good ADL Goals Pt Will Perform Grooming: with modified independence;standing Pt Will Perform Lower Body Dressing: with modified independence;sit to/from stand Pt Will Transfer to Toilet: with modified independence;ambulating Pt Will Perform Toileting - Clothing Manipulation and hygiene: with modified independence;sit to/from stand   OT Frequency:  Min 3X/week    Co-evaluation              AM-PAC OT 6 Clicks Daily Activity     Outcome Measure Help from another person eating meals?: None Help from another person taking care of personal grooming?: A Little Help from another person toileting, which includes using toliet, bedpan, or urinal?: A Little Help from another person bathing (including washing, rinsing, drying)?: A Little Help from another person to put on and taking off regular upper body clothing?: None Help  from another person to put on and taking off regular lower body clothing?: A Little 6 Click Score: 20   End of Session Equipment Utilized During Treatment: Gait belt;Rolling walker (2 wheels) Nurse Communication: Mobility status;Other (comment) (Pt request for reflux management)  Activity Tolerance: Patient tolerated treatment well Patient left: in chair;with call bell/phone within  reach;with chair alarm set;with family/visitor present  OT Visit Diagnosis: Unsteadiness on feet (R26.81);Other abnormalities of gait and mobility (R26.89);Muscle weakness (generalized) (M62.81)                Time: 1000-1021 OT Time Calculation (min): 21 min Charges:  OT General Charges $OT Visit: 1 Visit OT Evaluation $OT Eval Low Complexity: 1 Low OT Treatments $Self Care/Home Management : 8-22 mins  Rosaria Common M.S. OTR/L  12/05/23, 10:48 AM

## 2023-12-05 NOTE — Anesthesia Postprocedure Evaluation (Signed)
 Anesthesia Post Note  Patient: Paul Spears  Procedure(s) Performed: FIXATION, FEMUR, NECK, PERCUTANEOUS, USING SCREW (Right: Hip)  Patient location during evaluation: PACU Anesthesia Type: General Level of consciousness: awake and alert Pain management: pain level controlled Vital Signs Assessment: post-procedure vital signs reviewed and stable Respiratory status: spontaneous breathing, nonlabored ventilation, respiratory function stable and patient connected to nasal cannula oxygen Cardiovascular status: blood pressure returned to baseline and stable Postop Assessment: no apparent nausea or vomiting Anesthetic complications: no   No notable events documented.   Last Vitals:  Vitals:   12/04/23 1950 12/05/23 0446  BP: 136/78 129/63  Pulse: 88 82  Resp: 18 18  Temp: (!) 36.4 C (!) 36.2 C  SpO2: 96% 95%    Last Pain:  Vitals:   12/05/23 0508  TempSrc:   PainSc: 2                  Nancey Awkward

## 2023-12-05 NOTE — Plan of Care (Signed)

## 2023-12-05 NOTE — TOC Initial Note (Signed)
 Transition of Care [facility]) - Initial/Assessment Note    Patient Details  Name: Paul Spears MRN: [id number] Date of Birth: 1945-11-20  [facility] CM/SW Contact:    Alexandra Ice, RN Phone Number: 12/05/2023, 10:17 AM  Clinical Narrative:                 Patient S/P surgical repair of right femur neck fracture with ortho. Patient pending PT/OT eval. TOC will continue to monitor for discharge needs.  Expected Discharge Plan: Skilled Nursing Facility Barriers to Discharge: Continued Medical Work up   Patient Goals and CMS Choice            Expected Discharge Plan and Services     Post Acute Care Choice: Skilled Nursing Facility, Home Health Living arrangements for the past 2 months: Single Family Home                                      Prior Living Arrangements/Services Living arrangements for the past 2 months: Single Family Home Lives with:: Adult Children Patient language and need for interpreter reviewed:: Yes Do you feel safe going back to the place where you live?: Yes      Need for Family Participation in Patient Care: No (Comment) Care giver support system in place?: Yes (comment)      Activities of Daily Living   ADL Screening (condition at time of admission) Independently performs ADLs?: Yes (appropriate for developmental age) Is the patient deaf or have difficulty hearing?: Yes Does the patient have difficulty seeing, even when wearing glasses/contacts?: No Does the patient have difficulty concentrating, remembering, or making decisions?: No  Permission Sought/Granted                  Emotional Assessment Appearance:: Appears stated age Attitude/Demeanor/Rapport: Unable to Assess Affect (typically observed): Unable to Assess Orientation: : Oriented to Self, Oriented to Place, Oriented to  Time, Oriented to Situation Alcohol / Substance Use: Not Applicable Psych Involvement: No (comment)  Admission diagnosis:   Closed right hip fracture (HCC) [S72.001A] Closed fracture of left femur, unspecified fracture morphology, unspecified portion of femur, initial encounter (HCC) [S72.92XA] Patient Active Problem List   Diagnosis Date Noted   Closed right hip fracture (HCC) 12/03/2023   Arthritis of left knee 12/13/2021   Secondary hyperparathyroidism of renal origin (HCC) 12/04/2021   Reactive airway disease without complication 12/04/2021   Current moderate episode of major depressive disorder, unspecified whether recurrent (HCC) 12/04/2021   Acquired trigger finger of right ring finger 09/24/2019   Anemia in chronic kidney disease 08/23/2019   Essential (hemorrhagic) thrombocythemia (HCC)    Iron deficiency anemia 11/11/2017   DDD (degenerative disc disease), cervical 12/07/2015   GERD with esophagitis 09/21/2014   Barrett esophagus 09/21/2014   Cervical osteoarthritis 09/21/2014   Type 2 diabetes mellitus (HCC) 09/21/2014   Microalbuminuria 09/21/2014   Osteopenia 09/21/2014   Stage 3a chronic kidney disease (HCC) 09/21/2014   HTN (hypertension) 05/20/2014   Hyperlipidemia 05/20/2014   PCP:  Adeline Hone, PA-C Pharmacy:   [facility] [address], [place] - [address] [place] [address] Phone: [phone] Fax: [phone]     Social Drivers of Health (SDOH) Social History: SDOH Screenings   Food Insecurity: No Food Insecurity (12/03/2023)  Housing: Low Risk  (12/03/2023)  Transportation Needs: No Transportation Needs (12/03/2023)  Utilities: Not At Risk (12/03/2023)  Alcohol Screen: Low Risk  (01/28/2020)  Depression (PHQ2-9): Low Risk  (07/24/2023)  Financial Resource Strain: Low Risk  (06/19/2018)  Physical Activity: Inactive (06/19/2018)  Social Connections: Moderately Integrated (12/03/2023)  Stress: No Stress Concern Present (06/22/2019)  Tobacco Use: Low Risk  (12/04/2023)   SDOH Interventions:     Readmission Risk Interventions     No data to display

## 2023-12-05 NOTE — Discharge Instructions (Signed)

## 2023-12-05 NOTE — Progress Notes (Signed)
  Subjective: 1 Day Post-Op Procedure(s) (LRB): FIXATION, FEMUR, NECK, PERCUTANEOUS, USING SCREW (Right) Patient reports pain as mild.   Patient is well, and has had no acute complaints or problems Plan is to go Home after hospital stay. Negative for chest pain and shortness of breath Fever: no Gastrointestinal: Negative for nausea and vomiting  Objective: Vital signs in last 24 hours: Temp:  [97.1 F (36.2 C)-100.9 F (38.3 C)] 97.1 F (36.2 C) (06/20 0446) Pulse Rate:  [82-102] 82 (06/20 0446) Resp:  [17-29] 18 (06/20 0446) BP: (117-152)/(56-79) 129/63 (06/20 0446) SpO2:  [81 %-97 %] 95 % (06/20 0446) Weight:  [90.3 kg] 90.3 kg (06/19 1331)  Intake/Output from previous day:  Intake/Output Summary (Last 24 hours) at 12/05/2023 0657 Last data filed at 12/05/2023 0400 Gross per 24 hour  Intake 400 ml  Output 610 ml  Net -210 ml    Intake/Output this shift: Total I/O In: -  Out: 600 [Urine:600]  Labs: Recent Labs    12/03/23 1448 12/05/23 0420  HGB 10.6* 9.3*   Recent Labs    12/03/23 1448 12/05/23 0420  WBC 8.8 10.3  RBC 4.32 3.82*  HCT 33.6* 29.5*  PLT 370 265   Recent Labs    12/04/23 0356 12/05/23 0420  NA 134* 135  K 4.8 4.6  CL 105 103  CO2 22 20*  BUN 17 22  CREATININE 1.22 1.47*  GLUCOSE 302* 332*  CALCIUM  8.8* 8.6*   Recent Labs    12/03/23 1700  INR 1.2     EXAM General - Patient is Alert and Oriented Extremity - Neurovascular intact Sensation intact distally Dorsiflexion/Plantar flexion intact Compartment soft Dressing/Incision - clean, dry, no drainage Motor Function - intact, moving foot and toes well on exam.   Past Medical History:  Diagnosis Date   Allergy    Barrett's esophagus    Diabetes mellitus without complication (HCC)    Esophagitis    LA grade C   Essential hypertension 05/20/2014   Formatting of this note might be different from the original.  Last Assessment & Plan:   BP is well controlled.     Failure of  erection 09/21/2014   GERD (gastroesophageal reflux disease)    Hyperlipidemia    Hypertension    Iron deficiency anemia 11/11/2017   Kidney disease, chronic, stage III (GFR 30-59 ml/min) (HCC)    Pain in joint of left knee 12/13/2021   Pain of left forearm 09/12/2021   Potential for cognitive impairment 05/30/2021   Secondary hyperparathyroidism of renal origin (HCC) 03/07/2021    Assessment/Plan: 1 Day Post-Op Procedure(s) (LRB): FIXATION, FEMUR, NECK, PERCUTANEOUS, USING SCREW (Right) Principal Problem:   Closed right hip fracture (HCC)  Estimated body mass index is 28.56 kg/m as calculated from the following:   Height as of this encounter: 5' 10 (1.778 m).   Weight as of this encounter: 90.3 kg. Advance diet Up with therapy Discharge home with home health  DVT Prophylaxis - Lovenox and TED hose Weight-Bearing as tolerated to right leg  Bert Britain, PA-C Orthopaedic Surgery 12/05/2023, 6:57 AM

## 2023-12-05 NOTE — Evaluation (Signed)
 Physical Therapy Evaluation Patient Details Name: Paul Spears MRN: [id number] DOB: 1946-05-30 Today's Date: 12/05/2023  History of Present Illness  Paul Spears is a 78 y.o. male with medical history significant of essential hypertension, GERD, hyperlipidemia, iron deficiency anemia, CKD stage IIIb, hearing impairment who was otherwise well comes in from home lives with son.S/p closed right hip fracture post op day #1.  Clinical Impression  Pt received in Semi-Fowler's position and agreeable to therapy.  Pt noting he needed to go to the restroom if possible.  Pt performed well with transfers and navigated the room well with the RW.  Once at the bathroom door, pt elected to furniture surf with CGA utilized to go to the toilet.  Pt able to transfer utilizing the rails before mobilizing back to the bed.  Pt left with all needs met and feels confident in his ability to mobilize once he returns home.  Pt left with call bell within reach.         If plan is discharge home, recommend the following: A little help with walking and/or transfers;A little help with bathing/dressing/bathroom;Help with stairs or ramp for entrance;Assist for transportation   Can travel by private vehicle        Equipment Recommendations Rolling walker (2 wheels)  Recommendations for Other Services       Functional Status Assessment Patient has had a recent decline in their functional status and demonstrates the ability to make significant improvements in function in a reasonable and predictable amount of time.     Precautions / Restrictions Precautions Precautions: Fall Recall of Precautions/Restrictions: Intact Restrictions Weight Bearing Restrictions Per Provider Order: Yes RLE Weight Bearing Per Provider Order: Weight bearing as tolerated      Mobility  Bed Mobility Overal bed mobility: Modified Independent             General bed mobility comments: No physical assistance    Transfers Overall  transfer level: Needs assistance Equipment used: Rolling walker (2 wheels) Transfers: Sit to/from Stand Sit to Stand: Contact guard assist, Supervision           General transfer comment: CGA for initial STS, progressed to supervision    Ambulation/Gait Ambulation/Gait assistance: Contact guard assist Gait Distance (Feet): 20 Feet Assistive device: Rolling walker (2 wheels), None Gait Pattern/deviations: WFL(Within Functional Limits) Gait velocity: decreased     General Gait Details: pt utilized the Rw to navigate within the hospital room, then once in the bathroom, elected to not utilize an AD and furniture glided to the commode.  Stairs            Wheelchair Mobility     Tilt Bed    Modified Rankin (Stroke Patients Only)       Balance Overall balance assessment: Needs assistance Sitting-balance support: Feet supported, No upper extremity supported Sitting balance-Leahy Scale: Good Sitting balance - Comments: Steady reaching within BOS   Standing balance support: During functional activity, Reliant on assistive device for balance, Bilateral upper extremity supported Standing balance-Leahy Scale: Fair Standing balance comment: Mild unsteady, not used to RW                             Pertinent Vitals/Pain Pain Assessment Pain Assessment: Faces Faces Pain Scale: Hurts even more Pain Location: R hip with movement/mobility Pain Descriptors / Indicators: Aching, Dull Pain Intervention(s): Limited activity within patient's tolerance, Monitored during session, Repositioned    Home Living Family/patient expects  to be discharged to:: Private residence Living Arrangements: Alone;Children Available Help at Discharge: Family;Other (Comment) (Both work full time, alone most of the day) Type of Home: House Home Access: Stairs to enter   [employer] of Steps: 3   Home Layout: One level Home Equipment: None      Prior Function Prior Level  of Function : Independent/Modified Independent;Driving;History of Falls (last six months)             Mobility Comments: Indep no DME PTA ADLs Comments: Indep IADL/ADLs     Extremity/Trunk Assessment   Upper Extremity Assessment Upper Extremity Assessment: Overall WFL for tasks assessed    Lower Extremity Assessment Lower Extremity Assessment: RLE deficits/detail RLE Deficits / Details: Anticipated deficits with closed right hip fx RLE Sensation: WNL    Cervical / Trunk Assessment Cervical / Trunk Assessment: Kyphotic  Communication   Communication Communication: Impaired Factors Affecting Communication: Hearing impaired    Cognition Arousal: Alert Behavior During Therapy: WFL for tasks assessed/performed                                     Cueing Cueing Techniques: Verbal cues, Tactile cues     General Comments      Exercises     Assessment/Plan    PT Assessment Patient needs continued PT services  PT Problem List Decreased strength;Decreased activity tolerance;Decreased balance;Decreased mobility;Decreased knowledge of use of DME;Decreased safety awareness       PT Treatment Interventions DME instruction;Gait training;Stair training;Functional mobility training;Therapeutic activities;Therapeutic exercise;Balance training;Neuromuscular re-education    PT Goals (Current goals can be found in the Care Plan section)  Acute Rehab PT Goals Patient Stated Goal: to return home and become more mobile. PT Goal Formulation: With patient Time For Goal Achievement: 12/19/23 Potential to Achieve Goals: Good    Frequency Min 2X/week     Co-evaluation               AM-PAC PT 6 Clicks Mobility  Outcome Measure Help needed turning from your back to your side while in a flat bed without using bedrails?: None Help needed moving from lying on your back to sitting on the side of a flat bed without using bedrails?: None Help needed moving to and  from a bed to a chair (including a wheelchair)?: A Little Help needed standing up from a chair using your arms (e.g., wheelchair or bedside chair)?: A Little Help needed to walk in hospital room?: A Little Help needed climbing 3-5 steps with a railing? : A Little 6 Click Score: 20    End of Session Equipment Utilized During Treatment: Gait belt Activity Tolerance: Patient tolerated treatment well Patient left: in bed;with call bell/phone within reach;with bed alarm set Nurse Communication: Mobility status PT Visit Diagnosis: Unsteadiness on feet (R26.81);Other abnormalities of gait and mobility (R26.89);Muscle weakness (generalized) (M62.81);Difficulty in walking, not elsewhere classified (R26.2)    Time: 1214-1226 PT Time Calculation (min) (ACUTE ONLY): 12 min   Charges:   PT Evaluation $PT Eval Low Complexity: 1 Low   PT General Charges $$ ACUTE PT VISIT: 1 Visit         Rozanna Corner, PT, DPT Physical Therapist - [facility]  12/05/23, 2:53 PM

## 2023-12-05 NOTE — Discharge Summary (Signed)
 Physician Discharge Summary   Patient: Paul Spears MRN: [id number] DOB: 1945/07/05  Admit date:     12/03/2023  Discharge date: 12/05/23  Discharge Physician: Ezzard Holms   PCP: Adeline Hone, PA-C   Recommendations at discharge:  Follow-up with orthopedics  Discharge Diagnoses:  Acute nondisplaced right femoral neck fracture Essential hypertension GERD,  Hyperlipidemia,  Iron deficiency anemia,  CKD stage IIIb  Hospital Course: Paul Spears is a 78 y.o. male with medical history significant of essential hypertension, GERD, hyperlipidemia, iron deficiency anemia, CKD stage IIIb, hearing impairment who was otherwise well comes in from home lives with son.  According to patient he slipped and had a fall landing on his right hip thereby having right hip pain and therefore came to the emergency room for further management.  X-ray of the hip obtained showed right sided femoral neck fracture ED physician discussed with Dr. Lydia Sams orthopedic surgeon who agreed for patient to be admitted for surgical intervention. Patient was taken to the OR by orthopedic surgeon and underwent percutaneous pinning of Right femoral neck fracture . Patient was seen by PT OT and recommended for home health however he declined and therefore being discharged home to follow-up with orthopedics.  Consultants: Orthopedics Procedures performed: As mentioned above Disposition: Home Diet recommendation:  Cardiac diet DISCHARGE MEDICATION: Allergies as of 12/05/2023       Reactions   Losartan  Potassium Other (See Comments)   hyperkalemia        Medication List     TAKE these medications    Accu-Chek Aviva Plus test strip Generic drug: glucose blood Use as instructed   accu-chek multiclix lancets USE AS DIRECTED   albuterol  108 (90 Base) MCG/ACT inhaler Commonly known as: ProAir  HFA Inhale 2 puffs into the lungs every 4 (four) hours as needed for wheezing or shortness of breath.   amLODipine  5  MG tablet Commonly known as: NORVASC  Take 1 tablet (5 mg total) by mouth daily. for blood pressure What changed: Another medication with the same name was removed. Continue taking this medication, and follow the directions you see here.   atorvastatin  80 MG tablet Commonly known as: LIPITOR Take 1 tablet (80 mg total) by mouth at bedtime.   bisacodyl 10 MG suppository Commonly known as: DULCOLAX Place 1 suppository (10 mg total) rectally daily as needed for moderate constipation.   docusate sodium 100 MG capsule Commonly known as: COLACE Take 1 capsule (100 mg total) by mouth 2 (two) times daily.   empagliflozin  25 MG Tabs tablet Commonly known as: Jardiance  Take 1 tablet (25 mg total) by mouth daily before breakfast.   enoxaparin 40 MG/0.4ML injection Commonly known as: LOVENOX Inject 0.4 mLs (40 mg total) into the skin daily for 28 days.   ezetimibe  10 MG tablet Commonly known as: ZETIA  Take 1 tablet (10 mg total) by mouth daily.   famotidine  20 MG tablet Commonly known as: PEPCID  Take 1 tablet (20 mg total) by mouth as needed for heartburn or indigestion.   fluticasone  50 MCG/ACT nasal spray Commonly known as: FLONASE  Place 2 sprays into both nostrils daily as needed.   glimepiride  1 MG tablet Commonly known as: AMARYL  Take 1-2 tablets (1-2 mg total) by mouth 2 (two) times daily with a meal. HOLD IF BLOOD SUGARS ARE LESS THAN 100 OR IF NOT EATING   IRON PO Take by mouth. 5000IU   levocetirizine 5 MG tablet Commonly known as: XYZAL  Take 1 tablet (5 mg total) by mouth every  evening.   metFORMIN  1000 MG tablet Commonly known as: GLUCOPHAGE  Take 1 tablet by mouth twice daily with food   oxyCODONE  5 MG immediate release tablet Commonly known as: Oxy IR/ROXICODONE  Take 0.5-1 tablets (2.5-5 mg total) by mouth every 6 (six) hours as needed for moderate pain (pain score 4-6) (pain score 4-6).   pantoprazole  40 MG tablet Commonly known as: PROTONIX  Take 1 tablet by  mouth once daily   traMADol 50 MG tablet Commonly known as: ULTRAM Take 1 tablet (50 mg total) by mouth every 6 (six) hours as needed (moderate pain, other narcotics cause side effects).   VITAMIN D3 PO Take 1 capsule by mouth daily.               Durable Medical Equipment  (From admission, onward)           Start     Ordered   12/05/23 1144  For home use only DME Walker rolling  Once       Question Answer Comment  Walker: With 5 Inch Wheels   Patient needs a walker to treat with the following condition Hip fracture, right (HCC)      12/05/23 1144            Follow-up Information     Bert Britain, PA-C Follow up.   Specialty: Orthopedic Surgery Why: For staple removal and x-rays of the right hip Clerk [facility] stated that because the patient isn't a primary patient there that staples cannot be removed. Contact information: [address] [place] [address] [phone]                Discharge Exam: Cleavon Curls Weights   12/03/23 2016 12/04/23 1331  Weight: 90.3 kg 90.3 kg   Elderly male laying in bed in acute pain Abdomen: Full obese none distended nontender Respiratory: Clear to auscultation bilaterally Musculoskeletal: Dressing clean and dry Cardiovascular: S1 and S2 present no Mamma heard CNS: Alert and oriented x 3 able to move all extremities however movement is limited on the right lower extremity on account of right hip pain and fracture    Condition at discharge: good  The results of significant diagnostics from this hospitalization (including imaging, microbiology, ancillary and laboratory) are listed below for reference.   Imaging Studies: DG HIP UNILAT WITH PELVIS 2-3 VIEWS RIGHT Result Date: 12/04/2023 CLINICAL DATA:  Elective surgery.  Right hip fracture fixation. EXAM: DG HIP (WITH OR WITHOUT PELVIS) 2-3V RIGHT COMPARISON:  Right femur radiographs 12/03/2023 FINDINGS: Images were performed  intraoperatively without the presence of a radiologist. The patient is undergoing ORIF of the previously seen proximal right femoral neck fracture with 3 longitudinal screws. No hardware complication is seen. Moderate right femoroacetabular osteoarthritis. Total fluoroscopy images: 4 Total fluoroscopy time: 86 seconds Total dose: Radiation Exposure Index (as provided by the fluoroscopic device): 20.1 mGy air Kerma Please see intraoperative findings for further detail. IMPRESSION: Intraoperative fluoroscopy for ORIF of proximal right femoral neck fracture. Electronically Signed   By: Bertina Broccoli M.D.   On: 12/04/2023 16:08   DG C-Arm 1-60 Min-No Report Result Date: 12/04/2023 Fluoroscopy was utilized by the requesting physician.  No radiographic interpretation.   DG Pelvis 1-2 Views Result Date: 12/03/2023 CLINICAL DATA:  Fall with hip injury EXAM: PELVIS - 1-2 VIEW COMPARISON:  None Available. FINDINGS: Pubic symphysis and rami appear intact. Acute right femoral neck fracture with slight impaction. No dislocation. Moderate degenerative changes of the right hip IMPRESSION: Acute  right femoral neck fracture. Electronically Signed   By: Esmeralda Hedge M.D.   On: 12/03/2023 16:17   DG Chest Portable 1 View Result Date: 12/03/2023 CLINICAL DATA:  Fall with hip fracture EXAM: PORTABLE CHEST 1 VIEW COMPARISON:  05/17/2014 FINDINGS: Low lung volumes. Borderline to mild cardiomegaly. Streaky atelectasis or minimal infiltrate left base. No pneumothorax. IMPRESSION: Low lung volumes with streaky atelectasis or minimal infiltrate at the left base. Borderline to mild cardiomegaly Electronically Signed   By: Esmeralda Hedge M.D.   On: 12/03/2023 16:17   DG FEMUR, MIN 2 VIEWS RIGHT Result Date: 12/03/2023 CLINICAL DATA:  Fall with right-sided hip pain EXAM: RIGHT FEMUR 2 VIEWS COMPARISON:  None Available. FINDINGS: Acute nondisplaced right femoral neck fracture. No femoral head dislocation. Vascular calcifications.  IMPRESSION: Acute nondisplaced right femoral neck fracture. Electronically Signed   By: Esmeralda Hedge M.D.   On: 12/03/2023 16:16    Microbiology: Results for orders placed or performed in visit on 01/28/22  Novel Coronavirus, NAA ([facility])     Status: None   Collection Time: 01/28/22 12:17 PM   Specimen: Nasopharyngeal(NP) swabs in vial transport medium   Nasopharynge  Previous  Result Value Ref Range Status   SARS-CoV-2, NAA Not Detected Not Detected Final    Comment: This nucleic acid amplification test was developed and its performance characteristics determined by [employer]. Nucleic acid amplification tests include RT-PCR and TMA. This test has not been FDA cleared or approved. This test has been authorized by FDA under an Emergency Use Authorization (EUA). This test is only authorized for the duration of time the declaration that circumstances exist justifying the authorization of the emergency use of in vitro diagnostic tests for detection of SARS-CoV-2 virus and/or diagnosis of COVID-19 infection under section 564(b)(1) of the Act, 21 U.S.C. 409WJX-9(J) (1), unless the authorization is terminated or revoked sooner. When diagnostic testing is negative, the possibility of a false negative result should be considered in the context of a patient's recent exposures and the presence of clinical signs and symptoms consistent with COVID-19. An individual without symptoms of COVID-19 and who is not shedding SARS-CoV-2 virus wo uld expect to have a negative (not detected) result in this assay.     Labs: CBC: Recent Labs  Lab 12/03/23 1448 12/05/23 0420  WBC 8.8 10.3  NEUTROABS 6.3  --   HGB 10.6* 9.3*  HCT 33.6* 29.5*  MCV 77.8* 77.2*  PLT 370 265   Basic Metabolic Panel: Recent Labs  Lab 12/03/23 1448 12/04/23 0356 12/05/23 0420  NA 136 134* 135  K 5.0 4.8 4.6  CL 108 105 103  CO2 20* 22 20*  GLUCOSE 328* 302* 332*  BUN 17 17 22   CREATININE 1.57* 1.22  1.47*  CALCIUM  9.3 8.8* 8.6*   Liver Function Tests: Recent Labs  Lab 12/03/23 1448 12/04/23 0356  AST 23 19  ALT 16 13  ALKPHOS 114 81  BILITOT 1.1 1.4*  PROT 7.3 6.5  ALBUMIN 4.2 3.7   CBG: Recent Labs  Lab 12/04/23 1519 12/04/23 1645 12/04/23 2114 12/05/23 0759 12/05/23 1215  GLUCAP 159* 226* 252* 245* 216*    Discharge time spent:  37 minutes.  Signed: Ezzard Holms, MD [facility] 12/05/2023

## 2023-12-22 ENCOUNTER — Other Ambulatory Visit: Payer: Self-pay | Admitting: Family Medicine

## 2023-12-22 DIAGNOSIS — E1165 Type 2 diabetes mellitus with hyperglycemia: Secondary | ICD-10-CM

## 2023-12-22 NOTE — Telephone Encounter (Unsigned)
 Copied from CRM [id number]. Topic: Clinical - Medication Refill >> Dec 22, 2023  8:44 AM Viola F wrote: Medication: glucose blood (ACCU-CHEK AVIVA PLUS) test strip [[id number]]  metFORMIN  (GLUCOPHAGE ) 1000 MG tablet [[id number]]  Has the patient contacted their pharmacy? Yes (Agent: If no, request that the patient contact the pharmacy for the refill. If patient does not wish to contact the pharmacy document the reason why and proceed with request.) (Agent: If yes, when and what did the pharmacy advise?)  This is the patient's preferred pharmacy:  [facility] [address], [place] - [address] [place] [address] Phone: [phone] Fax: [phone]  Is this the correct pharmacy for this prescription? Yes If no, delete pharmacy and type the correct one.   Has the prescription been filled recently? Yes  Is the patient out of the medication? Yes  Has the patient been seen for an appointment in the last year OR does the patient have an upcoming appointment? Yes  Can we respond through MyChart? No  Agent: Please be advised that Rx refills may take up to 3 business days. We ask that you follow-up with your pharmacy.

## 2023-12-23 ENCOUNTER — Encounter: Payer: Self-pay | Admitting: Family Medicine

## 2023-12-23 ENCOUNTER — Ambulatory Visit (INDEPENDENT_AMBULATORY_CARE_PROVIDER_SITE_OTHER): Admitting: Family Medicine

## 2023-12-23 VITALS — BP 132/70 | HR 120 | Resp 16 | Ht 70.0 in | Wt 180.0 lb

## 2023-12-23 DIAGNOSIS — D473 Essential (hemorrhagic) thrombocythemia: Secondary | ICD-10-CM | POA: Diagnosis not present

## 2023-12-23 DIAGNOSIS — Z09 Encounter for follow-up examination after completed treatment for conditions other than malignant neoplasm: Secondary | ICD-10-CM | POA: Diagnosis not present

## 2023-12-23 DIAGNOSIS — I10 Essential (primary) hypertension: Secondary | ICD-10-CM | POA: Diagnosis not present

## 2023-12-23 DIAGNOSIS — E782 Mixed hyperlipidemia: Secondary | ICD-10-CM | POA: Diagnosis not present

## 2023-12-23 DIAGNOSIS — Z7984 Long term (current) use of oral hypoglycemic drugs: Secondary | ICD-10-CM | POA: Diagnosis not present

## 2023-12-23 DIAGNOSIS — D631 Anemia in chronic kidney disease: Secondary | ICD-10-CM

## 2023-12-23 DIAGNOSIS — K22719 Barrett's esophagus with dysplasia, unspecified: Secondary | ICD-10-CM | POA: Diagnosis not present

## 2023-12-23 DIAGNOSIS — Z8781 Personal history of (healed) traumatic fracture: Secondary | ICD-10-CM | POA: Diagnosis not present

## 2023-12-23 DIAGNOSIS — E1165 Type 2 diabetes mellitus with hyperglycemia: Secondary | ICD-10-CM

## 2023-12-23 DIAGNOSIS — K2100 Gastro-esophageal reflux disease with esophagitis, without bleeding: Secondary | ICD-10-CM

## 2023-12-23 DIAGNOSIS — N1831 Chronic kidney disease, stage 3a: Secondary | ICD-10-CM

## 2023-12-23 DIAGNOSIS — Z13820 Encounter for screening for osteoporosis: Secondary | ICD-10-CM

## 2023-12-23 DIAGNOSIS — F321 Major depressive disorder, single episode, moderate: Secondary | ICD-10-CM

## 2023-12-23 MED ORDER — EZETIMIBE 10 MG PO TABS: 10.0000 mg | ORAL_TABLET | Freq: Every day | ORAL | 1 refills | Status: DC

## 2023-12-23 MED ORDER — FLUOXETINE HCL 10 MG PO CAPS: 10.0000 mg | ORAL_CAPSULE | Freq: Every day | ORAL | 0 refills | Status: DC

## 2023-12-23 MED ORDER — ATORVASTATIN CALCIUM 80 MG PO TABS: 80.0000 mg | ORAL_TABLET | Freq: Every day | ORAL | 1 refills | Status: DC

## 2023-12-23 MED ORDER — FAMOTIDINE 20 MG PO TABS
20.0000 mg | ORAL_TABLET | ORAL | 1 refills | Status: DC | PRN
Start: 2023-12-23 — End: 2024-04-15

## 2023-12-23 NOTE — Progress Notes (Signed)
 Name: Paul Spears   MRN: [id number]    DOB: Apr 19, 1946   Date:12/23/2023       Progress Note  Chief Complaint  Patient presents with   Medication Refill   Diabetes   Hypertension     Subjective:   Paul Spears is a 78 y.o. male, presents to clinic for routine follow up on chronic conditions  DM uncontrolled on metformin  Jardiance  and glipizide  off and on with poor dietary effort He states his sugar was 135 the other day, 137 this morning, once recently when it was 91 he was worried it was too low though he felt fine and he generally doing it to get his sugar up Lab Results  Component Value Date   HGBA1C 7.5 (A) 07/24/2023   Complains of lightheadedness which has been persistent for several months prior to his fall in his bedroom which led to his right hip fracture  He wants levels checked he used to get infusions when he used to see Dr. Hinton Hx of anemia which has been stable and chronic Hemoglobin  Date Value Ref Range Status  12/05/2023 9.3 (L) 13.0 - 17.0 g/dL Final  93/81/7974 89.3 (L) 13.0 - 17.0 g/dL Final  97/93/7974 88.9 (L) 13.2 - 17.1 g/dL Final  89/82/7975 89.0 (L) 13.2 - 17.1 g/dL Final   Lab Results  Component Value Date   IRON  111 06/30/2019   TIBC 362 06/30/2019   FERRITIN 27 06/30/2019   On statin and zetia   Lab Results  Component Value Date   CHOL 98 04/03/2023   HDL 39 (L) 04/03/2023   LDLCALC 40 04/03/2023   TRIG 103 04/03/2023   CHOLHDL 2.5 04/03/2023   Patient declined going to rehab/skilled nursing and instead went home he has not yet done a Ortho follow-up or PT OT he does have his rolling walker       Current Outpatient Medications:    amLODipine  (NORVASC ) 5 MG tablet, Take 1 tablet (5 mg total) by mouth daily. for blood pressure, Disp: 90 tablet, Rfl: 0   atorvastatin  (LIPITOR ) 80 MG tablet, Take 1 tablet (80 mg total) by mouth at bedtime., Disp: 90 tablet, Rfl: 1   bisacodyl  (DULCOLAX) 10 MG suppository, Place 1 suppository  (10 mg total) rectally daily as needed for moderate constipation., Disp: 12 suppository, Rfl: 0   Cholecalciferol (VITAMIN D3 PO), Take 1 capsule by mouth daily., Disp: , Rfl:    docusate sodium  (COLACE) 100 MG capsule, Take 1 capsule (100 mg total) by mouth 2 (two) times daily., Disp: 10 capsule, Rfl: 0   empagliflozin  (JARDIANCE ) 25 MG TABS tablet, Take 1 tablet (25 mg total) by mouth daily before breakfast., Disp: 30 tablet, Rfl: 5   enoxaparin  (LOVENOX ) 40 MG/0.4ML injection, Inject 0.4 mLs (40 mg total) into the skin daily for 28 days., Disp: 11.2 mL, Rfl: 0   ezetimibe  (ZETIA ) 10 MG tablet, Take 1 tablet (10 mg total) by mouth daily., Disp: 90 tablet, Rfl: 1   famotidine  (PEPCID ) 20 MG tablet, Take 1 tablet (20 mg total) by mouth as needed for heartburn or indigestion., Disp: 180 tablet, Rfl: 1   glimepiride  (AMARYL ) 1 MG tablet, Take 1-2 tablets (1-2 mg total) by mouth 2 (two) times daily with a meal. HOLD IF BLOOD SUGARS ARE LESS THAN 100 OR IF NOT EATING, Disp: 270 tablet, Rfl: 1   glucose blood (ACCU-CHEK AVIVA PLUS) test strip, Use as instructed, Disp: 100 each, Rfl: 3   Lancets (ACCU-CHEK MULTICLIX) lancets,  USE AS DIRECTED, Disp: 102 each, Rfl: 12   metFORMIN  (GLUCOPHAGE ) 1000 MG tablet, Take 1 tablet by mouth twice daily with food, Disp: 180 tablet, Rfl: 0   oxyCODONE  (OXY IR/ROXICODONE ) 5 MG immediate release tablet, Take 0.5-1 tablets (2.5-5 mg total) by mouth every 6 (six) hours as needed for moderate pain (pain score 4-6) (pain score 4-6)., Disp: 30 tablet, Rfl: 0   pantoprazole  (PROTONIX ) 40 MG tablet, Take 1 tablet by mouth once daily, Disp: 90 tablet, Rfl: 1   traMADol  (ULTRAM ) 50 MG tablet, Take 1 tablet (50 mg total) by mouth every 6 (six) hours as needed (moderate pain, other narcotics cause side effects)., Disp: 30 tablet, Rfl: 0  Patient Active Problem List   Diagnosis Date Noted   Closed right hip fracture (HCC) 12/03/2023   Arthritis of left knee 12/13/2021   Secondary  hyperparathyroidism of renal origin (HCC) 12/04/2021   Reactive airway disease without complication 12/04/2021   Current moderate episode of major depressive disorder, unspecified whether recurrent (HCC) 12/04/2021   Acquired trigger finger of right ring finger 09/24/2019   Anemia in chronic kidney disease 08/23/2019   Essential (hemorrhagic) thrombocythemia (HCC)    Iron  deficiency anemia 11/11/2017   DDD (degenerative disc disease), cervical 12/07/2015   GERD with esophagitis 09/21/2014   Barrett esophagus 09/21/2014   Cervical osteoarthritis 09/21/2014   Type 2 diabetes mellitus (HCC) 09/21/2014   Microalbuminuria 09/21/2014   Osteopenia 09/21/2014   Stage 3a chronic kidney disease (HCC) 09/21/2014   HTN (hypertension) 05/20/2014   Hyperlipidemia 05/20/2014    Past Surgical History:  Procedure Laterality Date   CARPAL TUNNEL RELEASE Right    COLONOSCOPY  10/26/2012   repeat in 10 years MUS   COLONOSCOPY WITH PROPOFOL  N/A 02/08/2019   Procedure: COLONOSCOPY WITH PROPOFOL ;  Surgeon: Gaylyn Gladis PENNER, MD;  Location: [facility] ENDOSCOPY;  Service: Endoscopy;  Laterality: N/A;   COLONOSCOPY WITH PROPOFOL  N/A 06/14/2022   Procedure: COLONOSCOPY WITH PROPOFOL ;  Surgeon: Maryruth Ole DASEN, MD;  Location: [facility] ENDOSCOPY;  Service: Endoscopy;  Laterality: N/A;   ESOPHAGOGASTRODUODENOSCOPY (EGD) WITH PROPOFOL  N/A 10/22/2016   Procedure: ESOPHAGOGASTRODUODENOSCOPY (EGD) WITH PROPOFOL ;  Surgeon: Gaylyn Gladis PENNER, MD;  Location: [facility] ENDOSCOPY;  Service: Endoscopy;  Laterality: N/A;   ESOPHAGOGASTRODUODENOSCOPY (EGD) WITH PROPOFOL  N/A 07/14/2018   Procedure: ESOPHAGOGASTRODUODENOSCOPY (EGD) WITH PROPOFOL ;  Surgeon: Jinny Carmine, MD;  Location: [facility] ENDOSCOPY;  Service: Endoscopy;  Laterality: N/A;   ESOPHAGOGASTRODUODENOSCOPY (EGD) WITH PROPOFOL  N/A 06/14/2022   Procedure: ESOPHAGOGASTRODUODENOSCOPY (EGD) WITH PROPOFOL ;  Surgeon: Maryruth Ole DASEN, MD;  Location: [facility] ENDOSCOPY;  Service:  Endoscopy;  Laterality: N/A;   FRACTURE SURGERY Left    arm   HIP PINNING,CANNULATED Right 12/04/2023   Procedure: FIXATION, FEMUR, NECK, PERCUTANEOUS, USING SCREW;  Surgeon: Tobie Priest, MD;  Location: [facility] ORS;  Service: Orthopedics;  Laterality: Right;   SHOULDER ARTHROSCOPY WITH SUBACROMIAL DECOMPRESSION AND BICEP TENDON REPAIR Right 04/16/2018   Procedure: ARTHROSCOPIC  BICEP TENOTOMY;  Surgeon: Marchia Drivers, MD;  Location: [facility] ORS;  Service: Orthopedics;  Laterality: Right;   UPPER ESOPHAGEAL ENDOSCOPIC ULTRASOUND (EUS)  6/15 ue to repeat in 1 year    Family History  Problem Relation Age of Onset   Hypertension Father    Heart attack Father    Heart disease Sister    Hypertension Sister     Social History   Tobacco Use   Smoking status: Never   Smokeless tobacco: Never  Vaping Use   Vaping status: Never Used  Substance Use Topics  Alcohol use: No   Drug use: No     Allergies  Allergen Reactions   Losartan  Potassium Other (See Comments)    hyperkalemia    Health Maintenance  Topic Date Due   Medicare Annual Wellness (AWV)  06/21/2020   OPHTHALMOLOGY EXAM  10/19/2021   COVID-19 Vaccine (4 - 2024-25 season) 02/16/2023   FOOT EXAM  12/02/2023   INFLUENZA VACCINE  01/16/2024   HEMOGLOBIN A1C  01/21/2024   Diabetic kidney evaluation - Urine ACR  07/23/2024   Diabetic kidney evaluation - eGFR measurement  12/04/2024   DTaP/Tdap/Td (3 - Td or Tdap) 12/02/2033   Pneumococcal Vaccine: 50+ Years  Completed   Hepatitis C Screening  Completed   Zoster Vaccines- Shingrix  Completed   Hepatitis B Vaccines  Aged Out   HPV VACCINES  Aged Out   Meningococcal B Vaccine  Aged Out   Colonoscopy  Discontinued    Chart Review Today: I personally reviewed active problem list, medication list, allergies, family history, social history, health maintenance, notes from last encounter, lab results, imaging with the patient/caregiver today.   Review of Systems   Constitutional: Negative.   HENT: Negative.    Eyes: Negative.   Respiratory: Negative.    Cardiovascular: Negative.   Gastrointestinal: Negative.   Endocrine: Negative.   Genitourinary: Negative.   Musculoskeletal: Negative.   Skin: Negative.   Allergic/Immunologic: Negative.   Neurological: Negative.   Hematological: Negative.   Psychiatric/Behavioral: Negative.    All other systems reviewed and are negative.    Objective:   Vitals:   12/23/23 1358  BP: 132/70  Pulse: (!) 120  Resp: 16  SpO2: 98%  Weight: 180 lb (81.6 kg)  Height: 5' 10 (1.778 m)    Body mass index is 25.83 kg/m.  Physical Exam Vitals and nursing note reviewed.  Constitutional:      General: He is not in acute distress.    Appearance: Normal appearance. He is well-developed. He is not ill-appearing, toxic-appearing or diaphoretic.  HENT:     Head: Normocephalic and atraumatic.     Right Ear: External ear normal.     Left Ear: External ear normal.     Nose: Nose normal.  Eyes:     General: No scleral icterus.       Right eye: No discharge.        Left eye: No discharge.     Conjunctiva/sclera: Conjunctivae normal.  Neck:     Trachea: No tracheal deviation.  Cardiovascular:     Rate and Rhythm: Regular rhythm. Tachycardia present.     Pulses: Normal pulses.     Heart sounds: Normal heart sounds. No murmur heard.    No friction rub. No gallop.  Pulmonary:     Effort: Pulmonary effort is normal. No respiratory distress.     Breath sounds: Normal breath sounds. No stridor. No wheezing, rhonchi or rales.  Skin:    General: Skin is warm.     Coloration: Skin is pale (chronic).     Findings: No rash.     Comments: Bandage to right hip, Tegaderm covering surrounding visible skin is normal in appearance without erythema, no visualized drainage  Neurological:     Mental Status: He is alert.     Motor: No abnormal muscle tone.     Coordination: Coordination normal.     Gait: Gait abnormal.   Psychiatric:        Mood and Affect: Mood is anxious. Mood is not depressed. Affect  is not tearful.        Speech: Speech normal.        Behavior: Behavior normal. Behavior is cooperative.        Thought Content: Thought content normal.        Results for orders placed or performed during the hospital encounter of 12/03/23  CBC with Differential   Collection Time: 12/03/23  2:48 PM  Result Value Ref Range   WBC 8.8 4.0 - 10.5 K/uL   RBC 4.32 4.22 - 5.81 MIL/uL   Hemoglobin 10.6 (L) 13.0 - 17.0 g/dL   HCT 66.3 (L) 60.9 - 47.9 %   MCV 77.8 (L) 80.0 - 100.0 fL   MCH 24.5 (L) 26.0 - 34.0 pg   MCHC 31.5 30.0 - 36.0 g/dL   RDW 83.3 (H) 88.4 - 84.4 %   Platelets 370 150 - 400 K/uL   nRBC 0.0 0.0 - 0.2 %   Neutrophils Relative % 71 %   Neutro Abs 6.3 1.7 - 7.7 K/uL   Lymphocytes Relative 18 %   Lymphs Abs 1.6 0.7 - 4.0 K/uL   Monocytes Relative 7 %   Monocytes Absolute 0.6 0.1 - 1.0 K/uL   Eosinophils Relative 2 %   Eosinophils Absolute 0.2 0.0 - 0.5 K/uL   Basophils Relative 1 %   Basophils Absolute 0.1 0.0 - 0.1 K/uL   Immature Granulocytes 1 %   Abs Immature Granulocytes 0.04 0.00 - 0.07 K/uL  Comprehensive metabolic panel   Collection Time: 12/03/23  2:48 PM  Result Value Ref Range   Sodium 136 135 - 145 mmol/L   Potassium 5.0 3.5 - 5.1 mmol/L   Chloride 108 98 - 111 mmol/L   CO2 20 (L) 22 - 32 mmol/L   Glucose, Bld 328 (H) 70 - 99 mg/dL   BUN 17 8 - 23 mg/dL   Creatinine, Ser 8.42 (H) 0.61 - 1.24 mg/dL   Calcium  9.3 8.9 - 10.3 mg/dL   Total Protein 7.3 6.5 - 8.1 g/dL   Albumin 4.2 3.5 - 5.0 g/dL   AST 23 15 - 41 U/L   ALT 16 0 - 44 U/L   Alkaline Phosphatase 114 38 - 126 U/L   Total Bilirubin 1.1 0.0 - 1.2 mg/dL   GFR, Estimated 45 (L) >60 mL/min   Anion gap 8 5 - 15  Protime-INR   Collection Time: 12/03/23  5:00 PM  Result Value Ref Range   Prothrombin Time 15.8 (H) 11.4 - 15.2 seconds   INR 1.2 0.8 - 1.2  APTT   Collection Time: 12/03/23  5:00 PM  Result  Value Ref Range   aPTT 28 24 - 36 seconds  Comprehensive metabolic panel   Collection Time: 12/04/23  3:56 AM  Result Value Ref Range   Sodium 134 (L) 135 - 145 mmol/L   Potassium 4.8 3.5 - 5.1 mmol/L   Chloride 105 98 - 111 mmol/L   CO2 22 22 - 32 mmol/L   Glucose, Bld 302 (H) 70 - 99 mg/dL   BUN 17 8 - 23 mg/dL   Creatinine, Ser 8.77 0.61 - 1.24 mg/dL   Calcium  8.8 (L) 8.9 - 10.3 mg/dL   Total Protein 6.5 6.5 - 8.1 g/dL   Albumin 3.7 3.5 - 5.0 g/dL   AST 19 15 - 41 U/L   ALT 13 0 - 44 U/L   Alkaline Phosphatase 81 38 - 126 U/L   Total Bilirubin 1.4 (H) 0.0 - 1.2 mg/dL  GFR, Estimated >60 >60 mL/min   Anion gap 7 5 - 15  Glucose, capillary   Collection Time: 12/04/23  6:40 AM  Result Value Ref Range   Glucose-Capillary 286 (H) 70 - 99 mg/dL  Glucose, capillary   Collection Time: 12/04/23  8:08 AM  Result Value Ref Range   Glucose-Capillary 275 (H) 70 - 99 mg/dL  Glucose, capillary   Collection Time: 12/04/23 10:23 AM  Result Value Ref Range   Glucose-Capillary 169 (H) 70 - 99 mg/dL  Glucose, capillary   Collection Time: 12/04/23  1:43 PM  Result Value Ref Range   Glucose-Capillary 135 (H) 70 - 99 mg/dL  Type and screen [facility]   Collection Time: 12/04/23  1:52 PM  Result Value Ref Range   ABO/RH(D) O POS    Antibody Screen NEG    Sample Expiration      12/07/2023,2359 Performed at [facility], [address] [place], [place] [address]   Glucose, capillary   Collection Time: 12/04/23  3:19 PM  Result Value Ref Range   Glucose-Capillary 159 (H) 70 - 99 mg/dL  Glucose, capillary   Collection Time: 12/04/23  4:45 PM  Result Value Ref Range   Glucose-Capillary 226 (H) 70 - 99 mg/dL  Glucose, capillary   Collection Time: 12/04/23  9:14 PM  Result Value Ref Range   Glucose-Capillary 252 (H) 70 - 99 mg/dL  Basic metabolic panel   Collection Time: 12/05/23  4:20 AM  Result Value Ref Range   Sodium 135 135 - 145 mmol/L    Potassium 4.6 3.5 - 5.1 mmol/L   Chloride 103 98 - 111 mmol/L   CO2 20 (L) 22 - 32 mmol/L   Glucose, Bld 332 (H) 70 - 99 mg/dL   BUN 22 8 - 23 mg/dL   Creatinine, Ser 8.52 (H) 0.61 - 1.24 mg/dL   Calcium  8.6 (L) 8.9 - 10.3 mg/dL   GFR, Estimated 49 (L) >60 mL/min   Anion gap 12 5 - 15  CBC   Collection Time: 12/05/23  4:20 AM  Result Value Ref Range   WBC 10.3 4.0 - 10.5 K/uL   RBC 3.82 (L) 4.22 - 5.81 MIL/uL   Hemoglobin 9.3 (L) 13.0 - 17.0 g/dL   HCT 70.4 (L) 60.9 - 47.9 %   MCV 77.2 (L) 80.0 - 100.0 fL   MCH 24.3 (L) 26.0 - 34.0 pg   MCHC 31.5 30.0 - 36.0 g/dL   RDW 82.8 (H) 88.4 - 84.4 %   Platelets 265 150 - 400 K/uL   nRBC 0.0 0.0 - 0.2 %  Glucose, capillary   Collection Time: 12/05/23  7:59 AM  Result Value Ref Range   Glucose-Capillary 245 (H) 70 - 99 mg/dL  Glucose, capillary   Collection Time: 12/05/23 12:15 PM  Result Value Ref Range   Glucose-Capillary 216 (H) 70 - 99 mg/dL      Assessment & Plan:   Stage 3a chronic kidney disease (HCC) Assessment & Plan: Managed by nephrology  Orders: -     Comprehensive metabolic panel with GFR  Mixed hyperlipidemia Assessment & Plan: Well controlled on current management, labs reviewed from recent visit Lab Results  Component Value Date   CHOL 98 04/03/2023   HDL 39 (L) 04/03/2023   LDLCALC 40 04/03/2023   TRIG 103 04/03/2023   CHOLHDL 2.5 04/03/2023   No change continue statin and Zetia  along with diet and lifestyle efforts  Orders: -  Atorvastatin  Calcium ; Take 1 tablet (80 mg total) by mouth at bedtime.  Dispense: 90 tablet; Refill: 1 -     Ezetimibe ; Take 1 tablet (10 mg total) by mouth daily.  Dispense: 90 tablet; Refill: 1  Type 2 diabetes mellitus with hyperglycemia, without long-term current use of insulin  [facility]) Assessment & Plan: Ucnontrolled on metformin  farxiga  and glipizide , poor dietary efforts He is due for diabetic foot exam but will defer this at this time with hospital follow-up  fracture and multiple other issues to address today, will recheck his basic labs needed for diabetes care and will do close follow-up Lab Results  Component Value Date   HGBA1C 7.5 (A) 07/24/2023     Orders: -     Atorvastatin  Calcium ; Take 1 tablet (80 mg total) by mouth at bedtime.  Dispense: 90 tablet; Refill: 1 -     Hemoglobin A1c -     Comprehensive metabolic panel with GFR  Gastroesophageal reflux disease with esophagitis, unspecified whether hemorrhage Assessment & Plan: Chronic GERD symptoms managed with Pepcid  no current abdominal pain indigestion burning change in bowels or blood in stool  Orders: -     Famotidine ; Take 1 tablet (20 mg total) by mouth as needed for heartburn or indigestion.  Dispense: 180 tablet; Refill: 1  Anemia in stage 3a chronic kidney disease (HCC) Assessment & Plan: Stable chronic anemia, previously noted to be due to chronic kidney disease His hemoglobin tends to stay around 10.5-11 He reports previously getting infusions which helped him symptomatically feel better Will check labs today  His pallor is about his baseline, no recent iron  panel or iron  supplementation on chart Expect some possible decline in hemoglobin or some iron  deficiency in light of his recent surgery however if significantly worse than his baseline we can refer to hematology His most recent labs and last iron  panels were reviewed Hemoglobin  Date Value Ref Range Status  12/05/2023 9.3 (L) 13.0 - 17.0 g/dL Final  93/81/7974 89.3 (L) 13.0 - 17.0 g/dL Final  97/93/7974 88.9 (L) 13.2 - 17.1 g/dL Final  89/82/7975 89.0 (L) 13.2 - 17.1 g/dL Final   Lab Results  Component Value Date   IRON  111 06/30/2019   TIBC 362 06/30/2019   FERRITIN 27 06/30/2019     Orders: -     CBC with Differential/Platelet -     Iron , TIBC and Ferritin Panel  Essential (hemorrhagic) thrombocythemia (HCC) Assessment & Plan: Stable thrombocytosis, was monitoring, with recent surgery and fracture  expect to have some lab changes   Primary hypertension Assessment & Plan: BP near goal today only on amlodipine  BP Readings from Last 3 Encounters:  12/23/23 132/70  12/05/23 (!) 142/67  07/24/23 136/82  Will check labs Continue amlodipine  and diet and lifestyle efforts   Orders: -     Comprehensive metabolic panel with GFR  Current moderate episode of major depressive disorder, unspecified whether recurrent (HCC) Assessment & Plan: Patient endorses worsening nerves, states his mother did well on Prozac , has had multiple life stressors and would like something that would possibly help with his moods, discussed a trial of Prozac  and close follow-up PHQ-9 and GAD-7 reviewed today    12/23/2023    2:04 PM 07/24/2023    8:41 AM 04/03/2023    8:04 AM  Depression screen PHQ 2/9  Decreased Interest 1 0 0  Down, Depressed, Hopeless 1 0 0  PHQ - 2 Score 2 0 0  Altered sleeping 1 0 0  Tired, decreased energy  1 0 0  Change in appetite 1 0 0  Feeling bad or failure about yourself  1 0 0  Trouble concentrating 0 1 0  Moving slowly or fidgety/restless 0 0 0  Suicidal thoughts 0 0 0  PHQ-9 Score 6 1 0  Difficult doing work/chores   Not difficult at all   PHQ-9 positive significant for mild depressive symptoms    01/28/2022   11:14 AM 06/29/2019    8:12 AM 09/15/2018    8:58 AM  GAD 7 : Generalized Anxiety Score  Nervous, Anxious, on Edge 0 2 0  Control/stop worrying 0 1 0  Worry too much - different things 0 2 0  Trouble relaxing 0 1 0  Restless 0 1 0  Easily annoyed or irritable 0 1 0  Afraid - awful might happen 0 1 0  Total GAD 7 Score 0 9 0  Anxiety Difficulty  Somewhat difficult Not difficult at all      Orders: -     FLUoxetine  HCl; Take 1 capsule (10 mg total) by mouth daily.  Dispense: 90 capsule; Refill: 0  Barrett's esophagus with dysplasia Assessment & Plan: Continue meds per GI, no change in symptoms   Hospital discharge follow-up Reviewed his fall and  hospitalization from June and discharged summary and follow-up recommendations X-rays, lab work, and inpatient notes all reviewed today Patient had no postop or hospital follow-up He has not yet done physical therapy or seeing orthopedics We did choose to discharge to home with his son instead of going to nursing facility or rehab  S/P right hip fracture Surgery via Dr. Earnestine Blanch Ortho follow-up as scheduled in October with Krystal Doyne, PA-C [facility]?  Unclear when incision was to be checked vs farther out ortho f/up? I have searched the chart and reviewed inpatient records and discharge summary and further sent a message to Dr. Blanch I will also forward to the PA     Return for 6 week f/up mood/nerves new med.   Michelene Cower, PA-C 12/23/23 2:35 PM

## 2023-12-23 NOTE — Assessment & Plan Note (Signed)
 Managed by nephrology.

## 2023-12-23 NOTE — Assessment & Plan Note (Signed)
 Well controlled on current management, labs reviewed from recent visit Lab Results  Component Value Date   CHOL 98 04/03/2023   HDL 39 (L) 04/03/2023   LDLCALC 40 04/03/2023   TRIG 103 04/03/2023   CHOLHDL 2.5 04/03/2023   No change continue statin and Zetia  along with diet and lifestyle efforts

## 2023-12-23 NOTE — Assessment & Plan Note (Signed)
 Stable thrombocytosis, was monitoring, with recent surgery and fracture expect to have some lab changes

## 2023-12-23 NOTE — Assessment & Plan Note (Signed)
 BP near goal today only on amlodipine  BP Readings from Last 3 Encounters:  12/23/23 132/70  12/05/23 (!) 142/67  07/24/23 136/82  Will check labs Continue amlodipine  and diet and lifestyle efforts

## 2023-12-23 NOTE — Assessment & Plan Note (Signed)
 Chronic GERD symptoms managed with Pepcid  no current abdominal pain indigestion burning change in bowels or blood in stool

## 2023-12-23 NOTE — Patient Instructions (Signed)
 I will let you know about your blood counts and iron  when I get lab results. I would like you to try starting prozac  and we will follow up in about 6 weeks to see how its helping with your mood and nerves

## 2023-12-23 NOTE — Assessment & Plan Note (Signed)
 Continue meds per GI, no change in symptoms

## 2023-12-23 NOTE — Assessment & Plan Note (Signed)
 Patient endorses worsening nerves, states his mother did well on Prozac , has had multiple life stressors and would like something that would possibly help with his moods, discussed a trial of Prozac  and close follow-up PHQ-9 and GAD-7 reviewed today    12/23/2023    2:04 PM 07/24/2023    8:41 AM 04/03/2023    8:04 AM  Depression screen PHQ 2/9  Decreased Interest 1 0 0  Down, Depressed, Hopeless 1 0 0  PHQ - 2 Score 2 0 0  Altered sleeping 1 0 0  Tired, decreased energy 1 0 0  Change in appetite 1 0 0  Feeling bad or failure about yourself  1 0 0  Trouble concentrating 0 1 0  Moving slowly or fidgety/restless 0 0 0  Suicidal thoughts 0 0 0  PHQ-9 Score 6 1 0  Difficult doing work/chores   Not difficult at all   PHQ-9 positive significant for mild depressive symptoms    01/28/2022   11:14 AM 06/29/2019    8:12 AM 09/15/2018    8:58 AM  GAD 7 : Generalized Anxiety Score  Nervous, Anxious, on Edge 0 2 0  Control/stop worrying 0 1 0  Worry too much - different things 0 2 0  Trouble relaxing 0 1 0  Restless 0 1 0  Easily annoyed or irritable 0 1 0  Afraid - awful might happen 0 1 0  Total GAD 7 Score 0 9 0  Anxiety Difficulty  Somewhat difficult Not difficult at all

## 2023-12-23 NOTE — Assessment & Plan Note (Signed)
 Stable chronic anemia, previously noted to be due to chronic kidney disease His hemoglobin tends to stay around 10.5-11 He reports previously getting infusions which helped him symptomatically feel better Will check labs today  His pallor is about his baseline, no recent iron  panel or iron  supplementation on chart Expect some possible decline in hemoglobin or some iron  deficiency in light of his recent surgery however if significantly worse than his baseline we can refer to hematology His most recent labs and last iron  panels were reviewed Hemoglobin  Date Value Ref Range Status  12/05/2023 9.3 (L) 13.0 - 17.0 g/dL Final  93/81/7974 89.3 (L) 13.0 - 17.0 g/dL Final  97/93/7974 88.9 (L) 13.2 - 17.1 g/dL Final  89/82/7975 89.0 (L) 13.2 - 17.1 g/dL Final   Lab Results  Component Value Date   IRON  111 06/30/2019   TIBC 362 06/30/2019   FERRITIN 27 06/30/2019

## 2023-12-23 NOTE — Assessment & Plan Note (Addendum)
 Ucnontrolled on metformin  farxiga  and glipizide , poor dietary efforts He is due for diabetic foot exam but will defer this at this time with hospital follow-up fracture and multiple other issues to address today, will recheck his basic labs needed for diabetes care and will do close follow-up Lab Results  Component Value Date   HGBA1C 7.5 (A) 07/24/2023

## 2023-12-24 ENCOUNTER — Other Ambulatory Visit: Payer: Self-pay | Admitting: Family Medicine

## 2023-12-24 ENCOUNTER — Ambulatory Visit: Payer: Self-pay | Admitting: Family Medicine

## 2023-12-24 DIAGNOSIS — E1165 Type 2 diabetes mellitus with hyperglycemia: Secondary | ICD-10-CM

## 2023-12-24 DIAGNOSIS — M25551 Pain in right hip: Secondary | ICD-10-CM | POA: Diagnosis not present

## 2023-12-24 DIAGNOSIS — E1169 Type 2 diabetes mellitus with other specified complication: Secondary | ICD-10-CM

## 2023-12-24 DIAGNOSIS — D509 Iron deficiency anemia, unspecified: Secondary | ICD-10-CM

## 2023-12-24 DIAGNOSIS — F321 Major depressive disorder, single episode, moderate: Secondary | ICD-10-CM

## 2023-12-24 DIAGNOSIS — F430 Acute stress reaction: Secondary | ICD-10-CM

## 2023-12-24 DIAGNOSIS — R269 Unspecified abnormalities of gait and mobility: Secondary | ICD-10-CM | POA: Diagnosis not present

## 2023-12-24 LAB — HEMOGLOBIN A1C
Hgb A1c MFr Bld: 8.3 % — ABNORMAL HIGH (ref <5.7)
Mean Plasma Glucose: 192 mg/dL
eAG (mmol/L): 10.6 mmol/L

## 2023-12-24 LAB — COMPREHENSIVE METABOLIC PANEL WITH GFR
AG Ratio: 1.7 (calc) (ref 1.0–2.5)
ALT: 17 U/L (ref 9–46)
AST: 19 U/L (ref 10–35)
Albumin: 4.5 g/dL (ref 3.6–5.1)
Alkaline phosphatase (APISO): 148 U/L — ABNORMAL HIGH (ref 35–144)
BUN/Creatinine Ratio: 12 (calc) (ref 6–22)
BUN: 19 mg/dL (ref 7–25)
CO2: 23 mmol/L (ref 20–32)
Calcium: 10 mg/dL (ref 8.6–10.3)
Chloride: 104 mmol/L (ref 98–110)
Creat: 1.65 mg/dL — ABNORMAL HIGH (ref 0.70–1.28)
Globulin: 2.6 g/dL (ref 1.9–3.7)
Glucose, Bld: 134 mg/dL — ABNORMAL HIGH (ref 65–99)
Potassium: 4.7 mmol/L (ref 3.5–5.3)
Sodium: 139 mmol/L (ref 135–146)
Total Bilirubin: 1 mg/dL (ref 0.2–1.2)
Total Protein: 7.1 g/dL (ref 6.1–8.1)
eGFR: 43 mL/min/1.73m2 — ABNORMAL LOW (ref >=60)

## 2023-12-24 LAB — CBC WITH DIFFERENTIAL/PLATELET
Absolute Lymphocytes: 2094 {cells}/uL (ref 850–3900)
Absolute Monocytes: 718 {cells}/uL (ref 200–950)
Basophils Absolute: 141 {cells}/uL (ref 0–200)
Basophils Relative: 1.9 %
Eosinophils Absolute: 207 {cells}/uL (ref 15–500)
Eosinophils Relative: 2.8 %
HCT: 36.9 % — ABNORMAL LOW (ref 38.5–50.0)
Hemoglobin: 11.1 g/dL — ABNORMAL LOW (ref 13.2–17.1)
MCH: 24.5 pg — ABNORMAL LOW (ref 27.0–33.0)
MCHC: 30.1 g/dL — ABNORMAL LOW (ref 32.0–36.0)
MCV: 81.5 fL (ref 80.0–100.0)
MPV: 10.7 fL (ref 7.5–12.5)
Monocytes Relative: 9.7 %
Neutro Abs: 4240 {cells}/uL (ref 1500–7800)
Neutrophils Relative %: 57.3 %
Platelets: 609 Thousand/uL — ABNORMAL HIGH (ref 140–400)
RBC: 4.53 Million/uL (ref 4.20–5.80)
RDW: 17.2 % — ABNORMAL HIGH (ref 11.0–15.0)
Total Lymphocyte: 28.3 %
WBC: 7.4 Thousand/uL (ref 3.8–10.8)

## 2023-12-24 LAB — IRON,TIBC AND FERRITIN PANEL
%SAT: 16 % — ABNORMAL LOW (ref 20–48)
Ferritin: 18 ng/mL — ABNORMAL LOW (ref 24–380)
Iron: 69 ug/dL (ref 50–180)
TIBC: 441 ug/dL — ABNORMAL HIGH (ref 250–425)

## 2023-12-24 MED ORDER — ACCU-CHEK AVIVA PLUS VI STRP: ORAL_STRIP | 0 refills | Status: DC

## 2023-12-24 MED ORDER — METFORMIN HCL 1000 MG PO TABS: 1000.0000 mg | ORAL_TABLET | Freq: Two times a day (BID) | ORAL | 0 refills | Status: DC

## 2023-12-24 MED ORDER — IRON (FERROUS SULFATE) 325 (65 FE) MG PO TABS: 325.0000 mg | ORAL_TABLET | ORAL | 1 refills | Status: DC

## 2023-12-24 MED ORDER — GLIPIZIDE 5 MG PO TABS: 5.0000 mg | ORAL_TABLET | Freq: Two times a day (BID) | ORAL | 0 refills | Status: DC

## 2023-12-24 NOTE — Telephone Encounter (Signed)
 Requested Prescriptions  Pending Prescriptions Disp Refills   glucose blood (ACCU-CHEK AVIVA PLUS) test strip 100 each 0    Sig: Use as instructed     Endocrinology: Diabetes - Testing Supplies Passed - 12/24/2023  8:16 AM      Passed - Valid encounter within last 12 months    Recent Outpatient Visits           Yesterday Stage 3a chronic kidney disease [facility])   [place] [facility] Leavy Mole, PA-C   5 months ago Type 2 diabetes mellitus with hyperglycemia, without long-term current use of insulin  [facility])   [place] [facility] Leavy Mole, PA-C   5 months ago Type 2 diabetes mellitus with hyperglycemia, without long-term current use of insulin  [facility])   [place] [facility] Leavy Mole, PA-C       Future Appointments             In 1 month Leavy Mole, PA-C [facility]             metFORMIN  (GLUCOPHAGE ) 1000 MG tablet 180 tablet 0    Sig: Take 1 tablet (1,000 mg total) by mouth 2 (two) times daily with a meal.     Endocrinology:  Diabetes - Biguanides Failed - 12/24/2023  8:16 AM      Failed - Cr in normal range and within 360 days    Creat  Date Value Ref Range Status  12/23/2023 1.65 (H) 0.70 - 1.28 mg/dL Final   Creatinine, Urine  Date Value Ref Range Status  07/24/2023 399 (H) 20 - 320 mg/dL Final         Failed - HBA1C is between 0 and 7.9 and within 180 days    Hgb A1c MFr Bld  Date Value Ref Range Status  12/23/2023 8.3 (H) <5.7 % Final    Comment:    For someone without known diabetes, a hemoglobin A1c value of 6.5% or greater indicates that they may have  diabetes and this should be confirmed with a follow-up  test. . For someone with known diabetes, a value <7% indicates  that their diabetes is well controlled and a value  greater than or equal to 7% indicates suboptimal  control. A1c targets should be individualized based on  duration of diabetes, age, comorbid  conditions, and  other considerations. . Currently, no consensus exists regarding use of hemoglobin A1c for diagnosis of diabetes for children. .          Failed - eGFR in normal range and within 360 days    GFR, Est African American  Date Value Ref Range Status  01/28/2020 60 > OR = 60 mL/min/1.24m2 Final   GFR, Est Non African American  Date Value Ref Range Status  01/28/2020 51 (L) > OR = 60 mL/min/1.27m2 Final   GFR, Estimated  Date Value Ref Range Status  12/05/2023 49 (L) >60 mL/min Final    Comment:    (NOTE) Calculated using the CKD-EPI Creatinine Equation (2021)    eGFR  Date Value Ref Range Status  12/23/2023 43 (L) > OR = 60 mL/min/1.90m2 Final         Failed - B12 Level in normal range and within 720 days    Vitamin B-12  Date Value Ref Range Status  08/29/2021 290 200 - 1,100 pg/mL Final    Comment:    . Please Note: Although the reference range for vitamin B12 is [phone] pg/mL,  it has been reported that between 5 and 10% of patients with values between 200 and 400 pg/mL may experience neuropsychiatric and hematologic abnormalities due to occult B12 deficiency; less than 1% of patients with values above 400 pg/mL will have symptoms. .          Failed - CBC within normal limits and completed in the last 12 months    WBC  Date Value Ref Range Status  12/23/2023 7.4 3.8 - 10.8 Thousand/uL Final   RBC  Date Value Ref Range Status  12/23/2023 4.53 4.20 - 5.80 Million/uL Final   Hemoglobin  Date Value Ref Range Status  12/23/2023 11.1 (L) 13.2 - 17.1 g/dL Final   HCT  Date Value Ref Range Status  12/23/2023 36.9 (L) 38.5 - 50.0 % Final   MCHC  Date Value Ref Range Status  12/23/2023 30.1 (L) 32.0 - 36.0 g/dL Final    Comment:    For adults, a slight decrease in the calculated MCHC value (in the range of 30 to 32 g/dL) is most likely not clinically significant; however, it should be interpreted with caution in correlation with other red  cell parameters and the patient's clinical condition.    [facility]  Date Value Ref Range Status  12/23/2023 24.5 (L) 27.0 - 33.0 pg Final   MCV  Date Value Ref Range Status  12/23/2023 81.5 80.0 - 100.0 fL Final   No results found for: PLTCOUNTKUC, LABPLAT, POCPLA RDW  Date Value Ref Range Status  12/23/2023 17.2 (H) 11.0 - 15.0 % Final         Passed - Valid encounter within last 6 months    Recent Outpatient Visits           Yesterday Stage 3a chronic kidney disease [facility])   [place] [facility] Leavy Mole, PA-C   5 months ago Type 2 diabetes mellitus with hyperglycemia, without long-term current use of insulin  [facility])   [facility] Leavy Mole, PA-C   5 months ago Type 2 diabetes mellitus with hyperglycemia, without long-term current use of insulin  [facility])   [place] [facility] Leavy Mole, PA-C       Future Appointments             In 1 month Leavy Mole, PA-C [facility]

## 2023-12-25 ENCOUNTER — Telehealth: Payer: Self-pay

## 2023-12-25 NOTE — Progress Notes (Signed)
 Care Guide Pharmacy Note  12/25/2023 Name: Paul Spears MRN: [id number] DOB: April 03, 1946  Referred By: Leavy Mole, PA-C Reason for referral: Complex Care Management (Outreach to schedule with Pharm d and LCSW )   Paul Spears is a 78 y.o. year old male who is a primary care patient of Tapia, Leisa, PA-C.  Paul Spears was referred to the pharmacist for assistance related to: DMII  Successful contact was made with the patient to discuss pharmacy services including being ready for the pharmacist to call at least 5 minutes before the scheduled appointment time and to have medication bottles and any blood pressure readings ready for review. The patient agreed to meet with the pharmacist via telephone visit on (date/time).12/26/2023  Jeoffrey Buffalo , RMA     [place]  [facility], [facility] Guide  Direct Dial: [phone]  Website: [email or web address]

## 2023-12-25 NOTE — Progress Notes (Signed)
 Complex Care Management Note  Care Guide Note 12/25/2023 Name: Paul Spears MRN: [id number] DOB: 1946/05/12  Paul Spears is a 78 y.o. year old male who sees Tapia, Leisa, PA-C for primary care. I reached out to Paul Spears by phone today to offer complex care management services.  Paul Spears was given information about Complex Care Management services today including:   The Complex Care Management services include support from the care team which includes your Nurse Care Manager, Clinical Social Worker, or Pharmacist.  The Complex Care Management team is here to help remove barriers to the health concerns and goals most important to you. Complex Care Management services are voluntary, and the patient may decline or stop services at any time by request to their care team member.   Complex Care Management Consent Status: Patient agreed to services and verbal consent obtained.   Follow up plan:  Telephone appointment with complex care management team member scheduled for:  LCSW 12/31/2023  Encounter Outcome:  Patient Scheduled   Jeoffrey Buffalo , RMA     [place]  [facility], [facility] Guide  Direct Dial: [phone]  Website: [email or web address]

## 2023-12-26 ENCOUNTER — Other Ambulatory Visit: Payer: Self-pay | Admitting: Family Medicine

## 2023-12-26 ENCOUNTER — Other Ambulatory Visit: Payer: Self-pay

## 2023-12-26 ENCOUNTER — Telehealth: Payer: Self-pay

## 2023-12-26 MED ORDER — METFORMIN HCL 1000 MG PO TABS: 1000.0000 mg | ORAL_TABLET | Freq: Two times a day (BID) | ORAL | 1 refills | Status: DC

## 2023-12-26 NOTE — Progress Notes (Signed)
   12/26/2023  Patient ID: Paul Spears, male   DOB: 08/07/1945, 78 y.o.   MRN: [id number]  Care Coordination Call  I called [facility] to request a refill of Jardiance , with the patient's consent, although he was not on the phone at the time. I was informed that the current cost is $204.04 for a 30-day supply and $298.04 for a 90-day supply. This is significantly higher than the previous amount the patient paid, which was $141 for a 90-day supply at the same pharmacy.  I called the patient back to inform him of the updated pricing. He is agreeable to exploring options for patient assistance. I will collaborate with the provider, CPhT, and the patient to pursue assistance and will inquire about a potential bridging medication in the meantime.  Dorcas Solian, PharmD Clinical Pharmacist Cell: [phone]

## 2023-12-26 NOTE — Progress Notes (Signed)
 12/26/2023 Name: Paul Spears MRN: [id number] DOB: [birth year]  Chief Complaint  Patient presents with   Medication Adherence   Diabetes    ANDRIS BROTHERS is a 78 y.o. year old male who presented for a telephone visit in which his son accompanied due to patient reported hearing issues.   They were referred to the pharmacist by their PCP for assistance in managing diabetes and medication adherence.    Subjective:  Care Team: Primary Care Provider: Leavy Mole, PA-C ; Next Scheduled Visit: 02/03/2024   Medication Access/Adherence  Current Pharmacy:  [facility] [address] [place] - [address] [place] [address] Phone: [phone] Fax: [phone]   Patient reports affordability concerns with their medications: No  Reports medications are affordable and states no copay. He doesn't know if he has PAP for Jardiance  but reports that he fills the medication at [facility]. Consented to refilling Jardiance  and states there is  $0 copay.  Patient reports access/transportation concerns to their pharmacy: No  Reports his son takes him to appointments Patient reports adherence concerns with their medications:  No  Reviewed gaps in fill history and patient was not able to explain due to reported memory issues. - He uses a weekly pillbox to help organize medications   Diabetes:  Current medications: Jardiance  25 mg daily, glipizide  5 mg twice daily --  metformin  1000 mg twice daily stopped??? Patient still has been taking metformin  but unaware that the medication was stopped.His  eGFR 43 (max dose: metformin  500 mg twice daily) and discussed with patient that perhaps due to his renal function metformin  was discontinued.  I discussed with patient that it seems that medication was stopped noted as change in therapy'. However, patient reports that he was only told to stop taking glimepiride .    Current glucose readings: 90s- 150s This morning: 150   Using traditional blood glucose meter; testing one time daily (fasting blood sugar) -- He admits that he just restarted checking his blood sugar last week.    Patient reports hypoglycemic s/sx including dizziness. Per patient report, it  he felt dizzy and checked his blood glucose and it was 150 mg/dL (fasting). Patient denies hyperglycemic symptoms including polyuria, polydipsia, polyphagia, nocturia, neuropathy, blurred vision.  Current meal patterns:  - Breakfast: skips - Lunch roast beef sandwich - Supper pimento cheese sandwich - Snacks cookies, potato chips - Drinks water  (mostly)  Current physical activity: physical therapy (clinic and at home)  Current medication access support: N/A Hypertension:  Current medications: amlodipine  5 mg daily  Patient has a validated, automated, upper arm home BP cuff Current blood pressure readings readings:   Today: 140/80 mmHg  Patient reports hypotensive s/sx including dizziness. Per patient report, it was unclear when he felt dizzy if 140/64mmHg was the BP reading or that was his daily BP reading. Patient denies hypertensive symptoms including headache, chest pain, shortness of breath   Hyperlipidemia/ASCVD Risk Reduction  Current lipid lowering medications: atorvastatin  80 mg daily     Objective:  Lab Results  Component Value Date   HGBA1C 8.3 (H) 12/23/2023    Lab Results  Component Value Date   CREATININE 1.65 (H) 12/23/2023   BUN 19 12/23/2023   NA 139 12/23/2023   K 4.7 12/23/2023   CL 104 12/23/2023   CO2 23 12/23/2023    Lab Results  Component Value Date   CHOL 98 04/03/2023   HDL 39 (L) 04/03/2023   LDLCALC 40 04/03/2023  TRIG 103 04/03/2023   CHOLHDL 2.5 04/03/2023    Medications Reviewed Today     Reviewed by Cleatus Dorcas SAUNDERS, [facility] (Pharmacist) on 12/26/23 at 1048  Med List Status: <None>   Medication Order Taking? Sig Documenting Provider Last Dose Status Informant  amLODipine  (NORVASC ) 5 MG  tablet [id number] Yes Take 1 tablet (5 mg total) by mouth daily. for blood pressure Dorinda Drue DASEN, MD  Active   atorvastatin  (LIPITOR ) 80 MG tablet [id number] Yes Take 1 tablet (80 mg total) by mouth at bedtime. Tapia, Leisa, PA-C  Active   bisacodyl  (DULCOLAX) 10 MG suppository [id number]  Place 1 suppository (10 mg total) rectally daily as needed for moderate constipation.  Patient not taking: Reported on 12/26/2023   Dorinda Drue DASEN, MD  Active   Cholecalciferol (VITAMIN D3 PO) [id number]  Take 1 capsule by mouth daily.  Patient not taking: Reported on 12/26/2023   [provider]  Active Self, Pharmacy Records  docusate sodium  (COLACE) 100 MG capsule [id number]  Take 1 capsule (100 mg total) by mouth 2 (two) times daily.  Patient not taking: Reported on 12/26/2023   Dorinda Drue DASEN, MD  Active   empagliflozin  (JARDIANCE ) 25 MG TABS tablet [id number] Yes Take 1 tablet (25 mg total) by mouth daily before breakfast. Leavy Mole, PA-C  Active Self, Pharmacy Records  enoxaparin  (LOVENOX ) 40 MG/0.4ML injection [id number] Yes Inject 0.4 mLs (40 mg total) into the skin daily for 28 days. Verlinda Boas, PA-C  Active   ezetimibe  (ZETIA ) 10 MG tablet [id number] Yes Take 1 tablet (10 mg total) by mouth daily. Tapia, Leisa, PA-C  Active   famotidine  (PEPCID ) 20 MG tablet [id number] Yes Take 1 tablet (20 mg total) by mouth as needed for heartburn or indigestion. Tapia, Leisa, PA-C  Active   FLUoxetine  (PROZAC ) 10 MG capsule [id number] Yes Take 1 capsule (10 mg total) by mouth daily. Tapia, Leisa, PA-C  Active   glipiZIDE  (GLUCOTROL ) 5 MG tablet [id number] Yes Take 1 tablet (5 mg total) by mouth 2 (two) times daily before a meal. Leavy Mole, PA-C  Active   glucose blood (ACCU-CHEK AVIVA PLUS) test strip [id number] Yes Use as instructed Leavy Mole, PA-C  Active   Iron , Ferrous Sulfate , 325 (65 Fe) MG TABS [id number] Yes Take 325 mg by mouth every other day. Tapia, Leisa, PA-C  Active   Lancets (ACCU-CHEK  MULTICLIX) lancets [id number] Yes USE AS DIRECTED Lavina Damien BRAVO, FNP  Active Self, Pharmacy Records  Multiple Vitamins-Minerals (MULTIVITAMIN ADULTS 50+) TABS [id number] Yes Take by mouth. [provider]  Active   oxyCODONE  (OXY IR/ROXICODONE ) 5 MG immediate release tablet [id number] Yes Take 0.5-1 tablets (2.5-5 mg total) by mouth every 6 (six) hours as needed for moderate pain (pain score 4-6) (pain score 4-6). Verlinda Boas, PA-C  Active   pantoprazole  (PROTONIX ) 40 MG tablet [id number] Yes Take 1 tablet by mouth once daily Tapia, Leisa, PA-C  Active Self, Pharmacy Records  traMADol  (ULTRAM ) 50 MG tablet [id number] Yes Take 1 tablet (50 mg total) by mouth every 6 (six) hours as needed (moderate pain, other narcotics cause side effects). Verlinda Boas, PA-C  Active               Assessment/Plan:   Diabetes: - Currently uncontrolled.  - Reviewed long term cardiovascular and renal outcomes of uncontrolled blood sugar - Reviewed goal A1c, goal fasting, and goal 2 hour post prandial glucose - Reviewed dietary modifications including lower glycemic index foods and more protein for meals  and snacks as well as fruits and veggies - Reviewed lifestyle modifications including: continue exercise via PT - Recommend to Jardiance  and glipizide .The patient stated that he will continue taking metformin  until his provider advises him to stop. I suggested reducing the dose to 500 mg twice daily, if he does not intend to discontinue it, as this may be a safer option given his renal function. I informed him that I will follow up with his primary care provider to clarify the plan. Despite multiple explanations regarding the concern for renal function and the likely reason for the medication change, the patient neither agreed nor disagreed with the recommendation. Will collaborate with PCP on clarification on metformin .  - Will refill Jardiance .  - Recommend to check glucose twice daily discussed but patient  willing to try to consistently test fasting blood glucose once daily.  - Suggested a pill reminder along with pillbox.      Hypertension: - Currently stable - Reviewed long term cardiovascular and renal outcomes of uncontrolled blood pressure - Reviewed appropriate blood pressure monitoring technique and reviewed goal blood pressure. Recommended to check home blood pressure and heart rate once daily - Recommend to continue current regimen    Hyperlipidemia/ASCVD Risk Reduction: - Currently controlled.  - Reviewed long term complications of uncontrolled cholesterol - Recommend to continue current regimen and reminded him about the importance of compliance.      Follow Up Plan: 4 weeks (telephone) with PharmD, his son will be present for appointment  Dorcas Solian, PharmD Clinical Pharmacist Cell: [phone]

## 2023-12-26 NOTE — Telephone Encounter (Signed)
Pharmacy comment: Please clarify the directions  for this prescription.

## 2023-12-26 NOTE — Telephone Encounter (Signed)
 Gave pt a call to let him know we will be mail out pap Boehringer Jardiance , spoke with pt ans is aware,pt will call back if any question when receiving application. Faxed provider portion today.

## 2023-12-30 DIAGNOSIS — M25551 Pain in right hip: Secondary | ICD-10-CM | POA: Diagnosis not present

## 2023-12-30 DIAGNOSIS — R269 Unspecified abnormalities of gait and mobility: Secondary | ICD-10-CM | POA: Diagnosis not present

## 2023-12-31 ENCOUNTER — Other Ambulatory Visit: Payer: Self-pay | Admitting: Licensed Clinical Social Worker

## 2023-12-31 NOTE — Patient Outreach (Signed)
 Complex Care Management   Visit Note  12/31/2023  Name:  Paul Spears MRN: [id number] DOB: Apr 04, 1946  Situation: Referral received for Complex Care Management related to Mental/Behavioral Health diagnosis depression I obtained verbal consent from Patient.  Visit completed with patient  on the phone  Background:   Past Medical History:  Diagnosis Date   Allergy    Barrett's esophagus    Diabetes mellitus without complication (HCC)    Esophagitis    LA grade C   Essential hypertension 05/20/2014   Formatting of this note might be different from the original.  Last Assessment & Plan:   BP is well controlled.     Failure of erection 09/21/2014   GERD (gastroesophageal reflux disease)    Hyperlipidemia    Hypertension    Iron  deficiency anemia 11/11/2017   Kidney disease, chronic, stage III (GFR 30-59 ml/min) (HCC)    Pain in joint of left knee 12/13/2021   Pain of left forearm 09/12/2021   Potential for cognitive impairment 05/30/2021   Secondary hyperparathyroidism of renal origin (HCC) 03/07/2021    Assessment: Patient Reported Symptoms:  Cognitive Cognitive Status: Alert and oriented to person, place, and time, Normal speech and language skills, Insightful and able to interpret abstract concepts Cognitive/Intellectual Conditions Management [RPT]: None reported or documented in medical history or problem list   Health Maintenance Behaviors: Annual physical exam, Exercise, Stress management Healing Pattern: Unsure Health Facilitated by: Stress management, Healthy diet  Neurological Neurological Review of Symptoms: No symptoms reported Neurological Management Strategies: Activity, Routine screening, Coping strategies Neurological Self-Management Outcome: 4 (good)  HEENT HEENT Symptoms Reported: No symptoms reported      Cardiovascular Cardiovascular Symptoms Reported: No symptoms reported Does patient have uncontrolled Hypertension?: No Cardiovascular Management  Strategies: Activity, Medication therapy Cardiovascular Self-Management Outcome: 3 (uncertain)  Respiratory Respiratory Symptoms Reported: No symptoms reported    Endocrine Endocrine Symptoms Reported: No symptoms reported Is patient diabetic?: Yes Is patient checking blood sugars at home?: Yes List most recent blood sugar readings, include date and time of day: 119 Endocrine Self-Management Outcome: 4 (good) Endocrine Comment: Check blood sugar regularly  Gastrointestinal Gastrointestinal Symptoms Reported: No symptoms reported      Genitourinary Genitourinary Symptoms Reported: No symptoms reported    Integumentary Integumentary Symptoms Reported: No symptoms reported    Musculoskeletal Musculoskelatal Symptoms Reviewed: Difficulty walking, Unsteady gait Additional Musculoskeletal Details: Participating in pysical therapy Musculoskeletal Management Strategies: Activity, Medication therapy, Coping strategies, Medical device Musculoskeletal Self-Management Outcome: 4 (good) Falls in the past year?: No Number of falls in past year: 1 or less Was there an injury with Fall?: No Fall Risk Category Calculator: 0 Patient Fall Risk Level: Low Fall Risk Patient at Risk for Falls Due to: Impaired balance/gait, Impaired mobility  Psychosocial Psychosocial Symptoms Reported: Depression - if selected complete PHQ 2-9 Additional Psychological Details: Reports ocassional depressive symptoms Behavioral Management Strategies: Activity, Coping strategies, Counseling, Support system Behavioral Health Self-Management Outcome: 4 (good) Major Change/Loss/Stressor/Fears (CP): Medical condition, self Techniques to Cope with Loss/Stress/Change: Diversional activities Quality of Family Relationships: helpful, involved, supportive Do you feel physically threatened by others?: No      12/31/2023   10:33 AM  Depression screen PHQ 2/9  Decreased Interest 1  Down, Depressed, Hopeless 1  PHQ - 2 Score 2   Altered sleeping 1  Tired, decreased energy 1  Change in appetite 0  Feeling bad or failure about yourself  1  Trouble concentrating 0  Moving slowly or fidgety/restless 0  Suicidal thoughts 0  PHQ-9 Score 5  Difficult doing work/chores Somewhat difficult    There were no vitals filed for this visit.  Medications Reviewed Today     Reviewed by Kit Alm LABOR, LCSW (Social Worker) on 12/31/23 at 1025  Med List Status: <None>   Medication Order Taking? Sig Documenting Provider Last Dose Status Informant  ACCU-CHEK AVIVA PLUS test strip [id number] Yes USE AS DIRECTED Tapia, Leisa, PA-C  Active   amLODipine  (NORVASC ) 5 MG tablet [id number] Yes Take 1 tablet (5 mg total) by mouth daily. for blood pressure Dorinda Drue DASEN, MD  Active   atorvastatin  (LIPITOR ) 80 MG tablet [id number] Yes Take 1 tablet (80 mg total) by mouth at bedtime. Leavy Mole, PA-C  Active   bisacodyl  (DULCOLAX) 10 MG suppository [id number]  Place 1 suppository (10 mg total) rectally daily as needed for moderate constipation.  Patient not taking: Reported on 12/31/2023   Dorinda Drue DASEN, MD  Active   Cholecalciferol (VITAMIN D3 PO) [id number]  Take 1 capsule by mouth daily.  Patient not taking: Reported on 12/31/2023   [provider]  Active Self, Pharmacy Records  docusate sodium  (COLACE) 100 MG capsule [id number]  Take 1 capsule (100 mg total) by mouth 2 (two) times daily.  Patient not taking: Reported on 12/31/2023   Dorinda Drue DASEN, MD  Active   empagliflozin  (JARDIANCE ) 25 MG TABS tablet [id number]  Take 1 tablet (25 mg total) by mouth daily before breakfast. Leavy Mole, PA-C  Active Self, Pharmacy Records  enoxaparin  (LOVENOX ) 40 MG/0.4ML injection [id number] Yes Inject 0.4 mLs (40 mg total) into the skin daily for 28 days. Verlinda Boas, PA-C  Active   ezetimibe  (ZETIA ) 10 MG tablet [id number] Yes Take 1 tablet (10 mg total) by mouth daily. Tapia, Leisa, PA-C  Active   famotidine  (PEPCID ) 20 MG tablet [id number]  Yes Take 1 tablet (20 mg total) by mouth as needed for heartburn or indigestion. Tapia, Leisa, PA-C  Active   FLUoxetine  (PROZAC ) 10 MG capsule [id number] Yes Take 1 capsule (10 mg total) by mouth daily. Tapia, Leisa, PA-C  Active   glipiZIDE  (GLUCOTROL ) 5 MG tablet [id number] Yes Take 1 tablet (5 mg total) by mouth 2 (two) times daily before a meal. Leavy Mole, PA-C  Active   Iron , Ferrous Sulfate , 325 (65 Fe) MG TABS [id number] Yes Take 325 mg by mouth every other day. Tapia, Leisa, PA-C  Active   Lancets (ACCU-CHEK MULTICLIX) lancets [id number] Yes USE AS DIRECTED Lavina Damien BRAVO, FNP  Active Self, Pharmacy Records  metFORMIN  (GLUCOPHAGE ) 1000 MG tablet [id number]  Take 1 tablet (1,000 mg total) by mouth 2 (two) times daily with a meal. Leavy Mole, PA-C  Active   Multiple Vitamins-Minerals (MULTIVITAMIN ADULTS 50+) TABS [id number] Yes Take by mouth. [provider]  Active   oxyCODONE  (OXY IR/ROXICODONE ) 5 MG immediate release tablet [id number] Yes Take 0.5-1 tablets (2.5-5 mg total) by mouth every 6 (six) hours as needed for moderate pain (pain score 4-6) (pain score 4-6). Verlinda Boas, PA-C  Active   pantoprazole  (PROTONIX ) 40 MG tablet [id number] Yes Take 1 tablet by mouth once daily Tapia, Leisa, PA-C  Active Self, Pharmacy Records  traMADol  (ULTRAM ) 50 MG tablet [id number] Yes Take 1 tablet (50 mg total) by mouth every 6 (six) hours as needed (moderate pain, other narcotics cause side effects). Verlinda Boas, PA-C  Active             Recommendation:   Continue taking your medication as  prescribed.   Monitor for changes in mood and utilize coping skills as needed    Follow Up Plan:   Telephone follow up appointment date/time:  01/20/2024  Alm Armor, LCSW [place], [facility] Licensed Clinical Social Worker Care Coordinator [phone]

## 2024-01-05 DIAGNOSIS — M25551 Pain in right hip: Secondary | ICD-10-CM | POA: Diagnosis not present

## 2024-01-05 DIAGNOSIS — R269 Unspecified abnormalities of gait and mobility: Secondary | ICD-10-CM | POA: Diagnosis not present

## 2024-01-08 NOTE — Telephone Encounter (Signed)
 Received provider portion for [employer] Jardiance  back today awaiting on pt portion.

## 2024-01-15 DIAGNOSIS — R269 Unspecified abnormalities of gait and mobility: Secondary | ICD-10-CM | POA: Diagnosis not present

## 2024-01-15 DIAGNOSIS — M25551 Pain in right hip: Secondary | ICD-10-CM | POA: Diagnosis not present

## 2024-01-16 NOTE — Telephone Encounter (Signed)
 Gave pt a call to follow up on PAP [employer] Jardiance ,spoke with pt said he has not received application,let him know we will be mailing out another application to be on the look for his mail if by any chance he does not received to call me back.

## 2024-01-20 ENCOUNTER — Telehealth: Payer: Self-pay | Admitting: Licensed Clinical Social Worker

## 2024-01-21 ENCOUNTER — Ambulatory Visit: Payer: Medicare Other | Admitting: Family Medicine

## 2024-01-22 DIAGNOSIS — R269 Unspecified abnormalities of gait and mobility: Secondary | ICD-10-CM | POA: Diagnosis not present

## 2024-01-22 DIAGNOSIS — M25551 Pain in right hip: Secondary | ICD-10-CM | POA: Diagnosis not present

## 2024-01-23 ENCOUNTER — Telehealth: Payer: Self-pay

## 2024-01-23 ENCOUNTER — Other Ambulatory Visit: Payer: Self-pay

## 2024-01-23 NOTE — Progress Notes (Signed)
   01/23/2024  Patient ID: Paul Spears, male   DOB: 03-17-1946, 78 y.o.   MRN: [id number]  Attempted to contact patient for medication management/review. Could not leave HIPAA compliant message for patient to return my call at their convenience as the number was busy.   First attempt for patient outreach. Will follow up with patient.  Thank you for allowing pharmacy to be a part of this patient's care.  Dorcas Solian, PharmD Clinical Pharmacist Cell: [phone]

## 2024-01-27 NOTE — Telephone Encounter (Signed)
 Gave pt a call to follow up on pt mail out PAP, pt phone is busy all the time.not sure if it is out of order.will try tomorrow again.

## 2024-01-29 DIAGNOSIS — R269 Unspecified abnormalities of gait and mobility: Secondary | ICD-10-CM | POA: Diagnosis not present

## 2024-01-29 DIAGNOSIS — M25551 Pain in right hip: Secondary | ICD-10-CM | POA: Diagnosis not present

## 2024-01-29 NOTE — Telephone Encounter (Signed)
 Gave pt a call to follow up on mail out pap [employer] Jardiance  pt did not answer.phone would just ring and could not leave a VM.

## 2024-02-03 ENCOUNTER — Encounter: Payer: Self-pay | Admitting: Family Medicine

## 2024-02-03 ENCOUNTER — Ambulatory Visit (INDEPENDENT_AMBULATORY_CARE_PROVIDER_SITE_OTHER): Admitting: Family Medicine

## 2024-02-03 VITALS — BP 136/72 | HR 81 | Resp 16 | Ht 70.0 in | Wt 171.0 lb

## 2024-02-03 DIAGNOSIS — E1165 Type 2 diabetes mellitus with hyperglycemia: Secondary | ICD-10-CM

## 2024-02-03 DIAGNOSIS — F430 Acute stress reaction: Secondary | ICD-10-CM

## 2024-02-03 DIAGNOSIS — F321 Major depressive disorder, single episode, moderate: Secondary | ICD-10-CM | POA: Diagnosis not present

## 2024-02-03 DIAGNOSIS — Z7984 Long term (current) use of oral hypoglycemic drugs: Secondary | ICD-10-CM

## 2024-02-03 NOTE — Patient Instructions (Signed)
 Continue prozac  or fluoxetine  10 mg daily for moods/stress/nerves  For your diabetes you should be on metformin  1000 mg twice daily, jardiance  25 mg daily, and glipizide  5 mg twice daily.  We need you back to recheck your labs around 03/24/2024 or just after.

## 2024-02-03 NOTE — Progress Notes (Signed)
 Name: Paul Spears   MRN: [id number]    DOB: 1945/07/02   Date:02/03/2024       Progress Note  Chief Complaint  Patient presents with   Follow-up    6 weeks   Depression     Subjective:   Paul Spears is a 78 y.o. male, presents to clinic for 6 week f/up on medications - he is having difficulty telling me what medications he is taking and then he contradicts himself for what he is or istn' taking or how he takes it.  Very limited hx today, he is here with his son  Here for f/up on mood after starting prozac  after last visit discussing worse mood and nerves    02/03/2024    1:27 PM 12/31/2023   10:33 AM 12/23/2023    2:04 PM 07/24/2023    8:41 AM 04/03/2023    8:04 AM  Depression screen PHQ 2/9  Decreased Interest 0 1 1 0 0  Down, Depressed, Hopeless 0 1 1 0 0  PHQ - 2 Score 0 2 2 0 0  Altered sleeping 1 1 1  0 0  Tired, decreased energy 0 1 1 0 0  Change in appetite 0 0 1 0 0  Feeling bad or failure about yourself  1 1 1  0 0  Trouble concentrating 0 0 0 1 0  Moving slowly or fidgety/restless 0 0 0 0 0  Suicidal thoughts 0 0 0 0 0  PHQ-9 Score 2 5 6 1  0  Difficult doing work/chores  Somewhat difficult   Not difficult at all      02/03/2024    1:52 PM 01/28/2022   11:14 AM 06/29/2019    8:12 AM 09/15/2018    8:58 AM  GAD 7 : Generalized Anxiety Score  Nervous, Anxious, on Edge 2 0 2 0  Control/stop worrying 2 0 1 0  Worry too much - different things 2 0 2 0  Trouble relaxing 1 0 1 0  Restless 1 0 1 0  Easily annoyed or irritable 1 0 1 0  Afraid - awful might happen 1 0 1 0  Total GAD 7 Score 10 0 9 0  Anxiety Difficulty Somewhat difficult  Somewhat difficult Not difficult at all   Diabetes uncontrolled - he was referred to pharmacist and one of his medications were changed D/c glimiperide and start glipizide  BID   Pt believes he is here today for only diabetes follow up He was asked to do 3 month f/up for DM after working with pharmacist           Current  Outpatient Medications:    ACCU-CHEK AVIVA PLUS test strip, USE AS DIRECTED, Disp: 100 each, Rfl: 0   amLODipine  (NORVASC ) 2.5 MG tablet, Take 2.5 mg by mouth daily., Disp: , Rfl:    atorvastatin  (LIPITOR ) 80 MG tablet, Take 1 tablet (80 mg total) by mouth at bedtime., Disp: 90 tablet, Rfl: 1   ezetimibe  (ZETIA ) 10 MG tablet, Take 1 tablet (10 mg total) by mouth daily., Disp: 90 tablet, Rfl: 1   famotidine  (PEPCID ) 20 MG tablet, Take 1 tablet (20 mg total) by mouth as needed for heartburn or indigestion., Disp: 180 tablet, Rfl: 1   FARXIGA  10 MG TABS tablet, Take 10 mg by mouth every morning., Disp: , Rfl:    FLUoxetine  (PROZAC ) 10 MG capsule, Take 1 capsule (10 mg total) by mouth daily., Disp: 90 capsule, Rfl: 0   Iron , Ferrous Sulfate , 325 (  65 Fe) MG TABS, Take 325 mg by mouth every other day., Disp: 45 tablet, Rfl: 1   Lancets (ACCU-CHEK MULTICLIX) lancets, USE AS DIRECTED, Disp: 102 each, Rfl: 12   metFORMIN  (GLUCOPHAGE ) 1000 MG tablet, Take 1 tablet (1,000 mg total) by mouth 2 (two) times daily with a meal., Disp: 180 tablet, Rfl: 1   Multiple Vitamins-Minerals (MULTIVITAMIN ADULTS 50+) TABS, Take by mouth., Disp: , Rfl:    oxyCODONE  (OXY IR/ROXICODONE ) 5 MG immediate release tablet, Take 0.5-1 tablets (2.5-5 mg total) by mouth every 6 (six) hours as needed for moderate pain (pain score 4-6) (pain score 4-6)., Disp: 30 tablet, Rfl: 0   pantoprazole  (PROTONIX ) 40 MG tablet, Take 1 tablet by mouth once daily, Disp: 90 tablet, Rfl: 1   traMADol  (ULTRAM ) 50 MG tablet, Take 1 tablet (50 mg total) by mouth every 6 (six) hours as needed (moderate pain, other narcotics cause side effects)., Disp: 30 tablet, Rfl: 0   bisacodyl  (DULCOLAX) 10 MG suppository, Place 1 suppository (10 mg total) rectally daily as needed for moderate constipation. (Patient not taking: Reported on 02/03/2024), Disp: 12 suppository, Rfl: 0   Cholecalciferol (VITAMIN D3 PO), Take 1 capsule by mouth daily. (Patient not taking:  Reported on 02/03/2024), Disp: , Rfl:    docusate sodium  (COLACE) 100 MG capsule, Take 1 capsule (100 mg total) by mouth 2 (two) times daily. (Patient not taking: Reported on 02/03/2024), Disp: 10 capsule, Rfl: 0   enoxaparin  (LOVENOX ) 40 MG/0.4ML injection, Inject 0.4 mLs (40 mg total) into the skin daily for 28 days. (Patient not taking: Reported on 02/03/2024), Disp: 11.2 mL, Rfl: 0   glipiZIDE  (GLUCOTROL ) 5 MG tablet, Take 1 tablet (5 mg total) by mouth 2 (two) times daily before a meal. (Patient not taking: Reported on 02/03/2024), Disp: 180 tablet, Rfl: 0  Patient Active Problem List   Diagnosis Date Noted   Closed right hip fracture (HCC) 12/03/2023   Arthritis of left knee 12/13/2021   Secondary hyperparathyroidism of renal origin (HCC) 12/04/2021   Reactive airway disease without complication 12/04/2021   Current moderate episode of major depressive disorder, unspecified whether recurrent (HCC) 12/04/2021   Acquired trigger finger of right ring finger 09/24/2019   Anemia in chronic kidney disease 08/23/2019   Essential (hemorrhagic) thrombocythemia (HCC)    Iron  deficiency anemia 11/11/2017   DDD (degenerative disc disease), cervical 12/07/2015   GERD with esophagitis 09/21/2014   Barrett esophagus 09/21/2014   Cervical osteoarthritis 09/21/2014   Type 2 diabetes mellitus (HCC) 09/21/2014   Microalbuminuria 09/21/2014   Osteopenia 09/21/2014   Stage 3a chronic kidney disease (HCC) 09/21/2014   HTN (hypertension) 05/20/2014   Hyperlipidemia 05/20/2014    Past Surgical History:  Procedure Laterality Date   CARPAL TUNNEL RELEASE Right    COLONOSCOPY  10/26/2012   repeat in 10 years MUS   COLONOSCOPY WITH PROPOFOL  N/A 02/08/2019   Procedure: COLONOSCOPY WITH PROPOFOL ;  Surgeon: Gaylyn Gladis PENNER, MD;  Location: [facility] ENDOSCOPY;  Service: Endoscopy;  Laterality: N/A;   COLONOSCOPY WITH PROPOFOL  N/A 06/14/2022   Procedure: COLONOSCOPY WITH PROPOFOL ;  Surgeon: Maryruth Ole DASEN,  MD;  Location: [facility] ENDOSCOPY;  Service: Endoscopy;  Laterality: N/A;   ESOPHAGOGASTRODUODENOSCOPY (EGD) WITH PROPOFOL  N/A 10/22/2016   Procedure: ESOPHAGOGASTRODUODENOSCOPY (EGD) WITH PROPOFOL ;  Surgeon: Gaylyn Gladis PENNER, MD;  Location: [facility] ENDOSCOPY;  Service: Endoscopy;  Laterality: N/A;   ESOPHAGOGASTRODUODENOSCOPY (EGD) WITH PROPOFOL  N/A 07/14/2018   Procedure: ESOPHAGOGASTRODUODENOSCOPY (EGD) WITH PROPOFOL ;  Surgeon: Jinny Carmine, MD;  Location: [facility] ENDOSCOPY;  Service: Endoscopy;  Laterality: N/A;   ESOPHAGOGASTRODUODENOSCOPY (EGD) WITH PROPOFOL  N/A 06/14/2022   Procedure: ESOPHAGOGASTRODUODENOSCOPY (EGD) WITH PROPOFOL ;  Surgeon: Maryruth Ole DASEN, MD;  Location: [facility] ENDOSCOPY;  Service: Endoscopy;  Laterality: N/A;   FRACTURE SURGERY Left    arm   HIP PINNING,CANNULATED Right 12/04/2023   Procedure: FIXATION, FEMUR, NECK, PERCUTANEOUS, USING SCREW;  Surgeon: Tobie Priest, MD;  Location: [facility] ORS;  Service: Orthopedics;  Laterality: Right;   SHOULDER ARTHROSCOPY WITH SUBACROMIAL DECOMPRESSION AND BICEP TENDON REPAIR Right 04/16/2018   Procedure: ARTHROSCOPIC  BICEP TENOTOMY;  Surgeon: Marchia Drivers, MD;  Location: [facility] ORS;  Service: Orthopedics;  Laterality: Right;   UPPER ESOPHAGEAL ENDOSCOPIC ULTRASOUND (EUS)  6/15 ue to repeat in 1 year    Family History  Problem Relation Age of Onset   Hypertension Father    Heart attack Father    Heart disease Sister    Hypertension Sister     Social History   Tobacco Use   Smoking status: Never   Smokeless tobacco: Never  Vaping Use   Vaping status: Never Used  Substance Use Topics   Alcohol use: No   Drug use: No     Allergies  Allergen Reactions   Losartan  Potassium Other (See Comments)    hyperkalemia    Health Maintenance  Topic Date Due   Medicare Annual Wellness (AWV)  06/21/2020   OPHTHALMOLOGY EXAM  10/19/2021   FOOT EXAM  12/02/2023   COVID-19 Vaccine (4 - 2024-25 season) 02/19/2024 (Originally 02/16/2023)    INFLUENZA VACCINE  09/14/2024 (Originally 01/16/2024)   HEMOGLOBIN A1C  06/24/2024   Diabetic kidney evaluation - Urine ACR  07/23/2024   Diabetic kidney evaluation - eGFR measurement  12/22/2024   DTaP/Tdap/Td (3 - Td or Tdap) 12/02/2033   Pneumococcal Vaccine: 50+ Years  Completed   Hepatitis C Screening  Completed   Zoster Vaccines- Shingrix  Completed   HPV VACCINES  Aged Out   Meningococcal B Vaccine  Aged Out   Colonoscopy  Discontinued    Chart Review Today: I personally reviewed active problem list, medication list, allergies, family history, social history, health maintenance, notes from last encounter, lab results, imaging with the patient/caregiver today.   Review of Systems  All other systems reviewed and are negative.    Objective:   Vitals:   02/03/24 1316  BP: 136/72  Pulse: 81  Resp: 16  SpO2: 99%  Weight: 171 lb (77.6 kg)  Height: 5' 10 (1.778 m)    Body mass index is 24.54 kg/m.  Physical Exam Vitals and nursing note reviewed.  Constitutional:      General: He is not in acute distress.    Appearance: Normal appearance. He is well-developed. He is not ill-appearing, toxic-appearing or diaphoretic.  HENT:     Head: Normocephalic and atraumatic.     Right Ear: External ear normal.     Left Ear: External ear normal.     Nose: Nose normal.  Eyes:     General: No scleral icterus.       Right eye: No discharge.        Left eye: No discharge.     Conjunctiva/sclera: Conjunctivae normal.  Neck:     Trachea: No tracheal deviation.  Cardiovascular:     Rate and Rhythm: Normal rate and regular rhythm.     Pulses: Normal pulses.     Heart sounds: Normal heart sounds. No murmur heard.    No friction rub. No gallop.  Pulmonary:  Effort: Pulmonary effort is normal. No respiratory distress.     Breath sounds: Normal breath sounds. No stridor. No wheezing, rhonchi or rales.  Skin:    General: Skin is warm.     Findings: No rash.     Comments: Bandage  to right hip, Tegaderm covering surrounding visible skin is normal in appearance without erythema, no visualized drainage  Neurological:     Mental Status: He is alert.     Motor: No abnormal muscle tone.     Coordination: Coordination normal.     Gait: Gait abnormal.  Psychiatric:        Mood and Affect: Mood is anxious. Mood is not depressed. Affect is not tearful.        Speech: Speech is tangential.        Behavior: Behavior is hyperactive. Behavior is cooperative.        Thought Content: Thought content normal.        Cognition and Memory: Memory is impaired.        Results for orders placed or performed in visit on 12/23/23  HgB A1c   Collection Time: 12/23/23  2:56 PM  Result Value Ref Range   Hgb A1c MFr Bld 8.3 (H) <5.7 %   Mean Plasma Glucose 192 mg/dL   eAG (mmol/L) 89.3 mmol/L  Comprehensive Metabolic Panel (CMET)   Collection Time: 12/23/23  2:56 PM  Result Value Ref Range   Glucose, Bld 134 (H) 65 - 99 mg/dL   BUN 19 7 - 25 mg/dL   Creat 8.34 (H) 9.29 - 1.28 mg/dL   eGFR 43 (L) > OR = 60 mL/min/1.78m2   BUN/Creatinine Ratio 12 6 - 22 (calc)   Sodium 139 135 - 146 mmol/L   Potassium 4.7 3.5 - 5.3 mmol/L   Chloride 104 98 - 110 mmol/L   CO2 23 20 - 32 mmol/L   Calcium  10.0 8.6 - 10.3 mg/dL   Total Protein 7.1 6.1 - 8.1 g/dL   Albumin 4.5 3.6 - 5.1 g/dL   Globulin 2.6 1.9 - 3.7 g/dL (calc)   AG Ratio 1.7 1.0 - 2.5 (calc)   Total Bilirubin 1.0 0.2 - 1.2 mg/dL   Alkaline phosphatase (APISO) 148 (H) 35 - 144 U/L   AST 19 10 - 35 U/L   ALT 17 9 - 46 U/L  CBC with Differential/Platelet   Collection Time: 12/23/23  2:56 PM  Result Value Ref Range   WBC 7.4 3.8 - 10.8 Thousand/uL   RBC 4.53 4.20 - 5.80 Million/uL   Hemoglobin 11.1 (L) 13.2 - 17.1 g/dL   HCT 63.0 (L) 61.4 - 49.9 %   MCV 81.5 80.0 - 100.0 fL   MCH 24.5 (L) 27.0 - 33.0 pg   MCHC 30.1 (L) 32.0 - 36.0 g/dL   RDW 82.7 (H) 88.9 - 84.9 %   Platelets 609 (H) 140 - 400 Thousand/uL   MPV 10.7 7.5 -  12.5 fL   Neutro Abs 4,240 1,500 - 7,800 cells/uL   Absolute Lymphocytes 2,094 850 - 3,900 cells/uL   Absolute Monocytes 718 200 - 950 cells/uL   Eosinophils Absolute 207 15 - 500 cells/uL   Basophils Absolute 141 0 - 200 cells/uL   Neutrophils Relative % 57.3 %   Total Lymphocyte 28.3 %   Monocytes Relative 9.7 %   Eosinophils Relative 2.8 %   Basophils Relative 1.9 %  Iron , TIBC and Ferritin Panel   Collection Time: 12/23/23  2:56 PM  Result Value Ref  Range   Iron  69 50 - 180 mcg/dL   TIBC 558 (H) 749 - 574 mcg/dL (calc)   %SAT 16 (L) 20 - 48 % (calc)   Ferritin 18 (L) 24 - 380 ng/mL      Assessment & Plan:     ICD-10-CM   1. Current moderate episode of major depressive disorder, unspecified whether recurrent (HCC)  F32.1    encouraged him to take the new medication daily and expect effect/benefit in 6-12 weeks, explained again it doesn't work PRN    2. Acute stress reaction  F43.0    continued sx, pt was here to f/up on sx after starting SSRI, but he cannot tell me if hes taking it, took it once, or stopped it - see above    3. Type 2 diabetes mellitus with hyperglycemia, without long-term current use of insulin  (HCC)  E11.65    unclear med compliance, very confusing, encouraged him to take metformin , farxiga , glipizide  5 mg BID and f/up with labs in 6 weeks        02/03/2024    1:27 PM 12/31/2023   10:33 AM 12/23/2023    2:04 PM  Depression screen PHQ 2/9  Decreased Interest 1 1 1   Down, Depressed, Hopeless 2 1 1   PHQ - 2 Score 3 2 2   Altered sleeping 3 1 1   Tired, decreased energy 2 1 1   Change in appetite 1 0 1  Feeling bad or failure about yourself  2 1 1   Trouble concentrating 2 0 0  Moving slowly or fidgety/restless 2 0 0  Suicidal thoughts 1 0 0  PHQ-9 Score 16 5 6   Difficult doing work/chores  Somewhat difficult       02/03/2024    1:52 PM 01/28/2022   11:14 AM 06/29/2019    8:12 AM 09/15/2018    8:58 AM  GAD 7 : Generalized Anxiety Score  Nervous,  Anxious, on Edge 2 0 2 0  Control/stop worrying 2 0 1 0  Worry too much - different things 2 0 2 0  Trouble relaxing 1 0 1 0  Restless 1 0 1 0  Easily annoyed or irritable 1 0 1 0  Afraid - awful might happen 1 0 1 0  Total GAD 7 Score 10 0 9 0  Anxiety Difficulty Somewhat difficult  Somewhat difficult Not difficult at all    Anxiety and depression sx worse, pt did not follow any of our prior plan and he is here for f/up, not surprisingly having continued and worsening sx.   May need psychiatry to consult We will try again to do meds for sx and hx of MDD/anxiety    Return in about 7 weeks (around 03/26/2024) for needs 40 min appt, uncontrolled DM, CKD, iron  deficiency labs.   Michelene Cower, PA-C 02/03/24 1:33 PM

## 2024-02-05 DIAGNOSIS — R269 Unspecified abnormalities of gait and mobility: Secondary | ICD-10-CM | POA: Diagnosis not present

## 2024-02-05 DIAGNOSIS — M25551 Pain in right hip: Secondary | ICD-10-CM | POA: Diagnosis not present

## 2024-02-06 NOTE — Telephone Encounter (Signed)
 Gave pt a call to follow up on PAP [employer] ,pt did not answer at this time,will let pt call back at his earliest convenience.

## 2024-02-09 ENCOUNTER — Encounter: Payer: Self-pay | Admitting: Licensed Clinical Social Worker

## 2024-02-09 ENCOUNTER — Telehealth: Payer: Self-pay | Admitting: Licensed Clinical Social Worker

## 2024-02-12 ENCOUNTER — Encounter: Payer: Self-pay | Admitting: Family Medicine

## 2024-02-12 DIAGNOSIS — R269 Unspecified abnormalities of gait and mobility: Secondary | ICD-10-CM | POA: Diagnosis not present

## 2024-02-12 DIAGNOSIS — M25551 Pain in right hip: Secondary | ICD-10-CM | POA: Diagnosis not present

## 2024-02-23 ENCOUNTER — Encounter: Payer: Self-pay | Admitting: Licensed Clinical Social Worker

## 2024-02-23 ENCOUNTER — Telehealth: Payer: Self-pay | Admitting: Licensed Clinical Social Worker

## 2024-02-23 DIAGNOSIS — Z8781 Personal history of (healed) traumatic fracture: Secondary | ICD-10-CM | POA: Diagnosis not present

## 2024-02-23 DIAGNOSIS — Z9889 Other specified postprocedural states: Secondary | ICD-10-CM | POA: Diagnosis not present

## 2024-02-23 NOTE — Patient Outreach (Signed)
 CSW unable to reach pt after 3 call attempts. Will close CCM episode. Please re-refer with any new needs assessed.  Alm Armor, LCSW [place], [facility] Licensed Clinical Social Worker Care Coordinator [phone]

## 2024-03-18 ENCOUNTER — Telehealth: Payer: Self-pay

## 2024-03-18 NOTE — Progress Notes (Signed)
 Complex Care Management Note Care Guide Note  03/18/2024 Name: Paul Spears MRN: [id number] DOB: 10/09/1945   Complex Care Management Outreach Attempts: An unsuccessful telephone outreach was attempted today to offer the patient information about available complex care management services.  Follow Up Plan:  Additional outreach attempts will be made to offer the patient complex care management information and services.   Encounter Outcome:  No Answer  [facility]  [facility], [facility] VBCI Assistant Direct Dial: [phone]  Fax: [phone]

## 2024-03-23 ENCOUNTER — Ambulatory Visit: Admitting: Family Medicine

## 2024-03-25 NOTE — Progress Notes (Signed)
 Complex Care Management Note Care Guide Note  03/25/2024 Name: Paul Spears MRN: [id number] DOB: 08/03/1945   Complex Care Management Outreach Attempts: A second unsuccessful outreach was attempted today to offer the patient with information about available complex care management services.  Follow Up Plan:  No further outreach attempts will be made at this time. We have been unable to contact the patient to offer or enroll patient in complex care management services.  Encounter Outcome:  No Answer  [facility]  [facility], [facility] VBCI Assistant Direct Dial: [phone]  Fax: [phone]

## 2024-04-15 ENCOUNTER — Encounter: Payer: Self-pay | Admitting: Internal Medicine

## 2024-04-15 ENCOUNTER — Ambulatory Visit (INDEPENDENT_AMBULATORY_CARE_PROVIDER_SITE_OTHER): Admitting: Internal Medicine

## 2024-04-15 ENCOUNTER — Other Ambulatory Visit: Payer: Self-pay

## 2024-04-15 VITALS — BP 134/82 | HR 100 | Temp 97.8°F | Resp 18 | Ht 70.0 in | Wt 182.6 lb

## 2024-04-15 DIAGNOSIS — N1831 Chronic kidney disease, stage 3a: Secondary | ICD-10-CM

## 2024-04-15 DIAGNOSIS — Z7984 Long term (current) use of oral hypoglycemic drugs: Secondary | ICD-10-CM

## 2024-04-15 DIAGNOSIS — D509 Iron deficiency anemia, unspecified: Secondary | ICD-10-CM | POA: Diagnosis not present

## 2024-04-15 DIAGNOSIS — E1165 Type 2 diabetes mellitus with hyperglycemia: Secondary | ICD-10-CM

## 2024-04-15 DIAGNOSIS — E782 Mixed hyperlipidemia: Secondary | ICD-10-CM

## 2024-04-15 DIAGNOSIS — J301 Allergic rhinitis due to pollen: Secondary | ICD-10-CM

## 2024-04-15 DIAGNOSIS — K2100 Gastro-esophageal reflux disease with esophagitis, without bleeding: Secondary | ICD-10-CM | POA: Diagnosis not present

## 2024-04-15 DIAGNOSIS — I10 Essential (primary) hypertension: Secondary | ICD-10-CM

## 2024-04-15 DIAGNOSIS — J45909 Unspecified asthma, uncomplicated: Secondary | ICD-10-CM

## 2024-04-15 DIAGNOSIS — D224 Melanocytic nevi of scalp and neck: Secondary | ICD-10-CM

## 2024-04-15 DIAGNOSIS — E559 Vitamin D deficiency, unspecified: Secondary | ICD-10-CM

## 2024-04-15 LAB — POCT GLYCOSYLATED HEMOGLOBIN (HGB A1C): Hemoglobin A1C: 7.6 % — AB (ref 4.0–5.6)

## 2024-04-15 MED ORDER — METFORMIN HCL ER 750 MG PO TB24: 1500.0000 mg | ORAL_TABLET | Freq: Every day | ORAL | 1 refills | Status: DC

## 2024-04-15 MED ORDER — FLUTICASONE PROPIONATE 50 MCG/ACT NA SUSP: 2.0000 | Freq: Every day | NASAL | 6 refills | Status: AC

## 2024-04-15 NOTE — Progress Notes (Signed)
 Established Patient Office Visit  Subjective   Patient ID: Paul Spears, male    DOB: [birth year]  Age: 78 y.o. MRN: [id number]  Chief Complaint  Patient presents with   Medical Management of Chronic Issues    HPI  Patient is here for follow up on chronic medical conditions. This is my first time meeting him.  Discussed the use of AI scribe software for clinical note transcription with the patient, who gave verbal consent to proceed.  History of Present Illness Paul Spears is a 78 year old male with gastrointestinal issues who presents for a follow-up visit.  He experiences alternating constipation and diarrhea, with hard stools followed by loose, watery diarrhea that disrupts his sleep. He uses Imodium to manage these symptoms, particularly when stool softening occurs. He associates the onset of diarrhea with metformin  and notes worsening symptoms after taking oxycodone  post-surgery.  His A1c has improved from 8.3% in July to 7.6% currently. He continues to take metformin .  For hypertension, he takes amlodipine  2.5 mg daily, which is effective. He takes atorvastatin  80 mg and Zetia  10 mg for hyperlipidemia, with good cholesterol levels reported a year ago.  He uses Nexium for acid reflux, which helps reduce cough and indigestion. He takes iron  supplements due to previously low iron  levels and has received iron  infusions in the past.  He uses albuterol  inhalers for reactive airway disease, especially when it rains, and Flonase  for nasal congestion. He has a history of hip surgery with nerve pain in his hip radiating down his leg. He is concerned about a dark brown mole on his skin.   Hypertension: -Medications: Amlodipine  2.5 mg -Patient is compliant with above medications and reports no side effects. -Denies any SOB, CP, vision changes, LE edema or symptoms of hypotension  HLD: -Medications: Lipitor  80 mg, Zetia  10 mg  -Patient is compliant with above medications and  reports no side effects.  -Last lipid panel: Lipid Panel     Component Value Date/Time   CHOL 98 04/03/2023 0921   CHOL 172 10/15/2016 0920   TRIG 103 04/03/2023 0921   HDL 39 (L) 04/03/2023 0921   HDL 39 (L) 10/15/2016 0920   CHOLHDL 2.5 04/03/2023 0921   VLDL 23 01/15/2017 0955   LDLCALC 40 04/03/2023 0921   LABVLDL 33 10/15/2016 0920   Diabetes, Type 2: -Last A1c 8.3% 7/25 -Medications: Metformin  1000 mg BID, Farxiga  10 mg, Glipizide  5 mg and Glimepiride  1 mg daily  -Patient is compliant with the above medications and reports no side effects.  -Checking BG at home: No -Eye exam: Due, discuss at follow up -Foot exam: Due, discuss at follow up -Microalbumin: UTD -Statin: yes -PNA vaccine: Prevnar 13 in 2019, discuss booster at follow up -Denies symptoms of hypoglycemia, polyuria, polydipsia, numbness extremities, foot ulcers/trauma.   Health Maintenance: -Blood work due   Patient Active Problem List   Diagnosis Date Noted   Closed right hip fracture (HCC) 12/03/2023   Arthritis of left knee 12/13/2021   Secondary hyperparathyroidism of renal origin 12/04/2021   Reactive airway disease without complication 12/04/2021   Current moderate episode of major depressive disorder, unspecified whether recurrent (HCC) 12/04/2021   Acquired trigger finger of right ring finger 09/24/2019   Anemia in chronic kidney disease 08/23/2019   Essential (hemorrhagic) thrombocythemia (HCC)    Iron  deficiency anemia 11/11/2017   DDD (degenerative disc disease), cervical 12/07/2015   GERD with esophagitis 09/21/2014   Barrett esophagus 09/21/2014   Cervical osteoarthritis 09/21/2014  Type 2 diabetes mellitus (HCC) 09/21/2014   Microalbuminuria 09/21/2014   Osteopenia 09/21/2014   Stage 3a chronic kidney disease (HCC) 09/21/2014   HTN (hypertension) 05/20/2014   Hyperlipidemia 05/20/2014   Past Medical History:  Diagnosis Date   Allergy    Barrett's esophagus    Diabetes mellitus  without complication (HCC)    Esophagitis    LA grade C   Essential hypertension 05/20/2014   Formatting of this note might be different from the original.  Last Assessment & Plan:   BP is well controlled.     Failure of erection 09/21/2014   GERD (gastroesophageal reflux disease)    Hyperlipidemia    Hypertension    Iron  deficiency anemia 11/11/2017   Kidney disease, chronic, stage III (GFR 30-59 ml/min) (HCC)    Pain in joint of left knee 12/13/2021   Pain of left forearm 09/12/2021   Potential for cognitive impairment 05/30/2021   Secondary hyperparathyroidism of renal origin 03/07/2021   Past Surgical History:  Procedure Laterality Date   CARPAL TUNNEL RELEASE Right    COLONOSCOPY  10/26/2012   repeat in 10 years MUS   COLONOSCOPY WITH PROPOFOL  N/A 02/08/2019   Procedure: COLONOSCOPY WITH PROPOFOL ;  Surgeon: Gaylyn Gladis PENNER, MD;  Location: [facility] ENDOSCOPY;  Service: Endoscopy;  Laterality: N/A;   COLONOSCOPY WITH PROPOFOL  N/A 06/14/2022   Procedure: COLONOSCOPY WITH PROPOFOL ;  Surgeon: Maryruth Ole DASEN, MD;  Location: [facility] ENDOSCOPY;  Service: Endoscopy;  Laterality: N/A;   ESOPHAGOGASTRODUODENOSCOPY (EGD) WITH PROPOFOL  N/A 10/22/2016   Procedure: ESOPHAGOGASTRODUODENOSCOPY (EGD) WITH PROPOFOL ;  Surgeon: Gaylyn Gladis PENNER, MD;  Location: [facility] ENDOSCOPY;  Service: Endoscopy;  Laterality: N/A;   ESOPHAGOGASTRODUODENOSCOPY (EGD) WITH PROPOFOL  N/A 07/14/2018   Procedure: ESOPHAGOGASTRODUODENOSCOPY (EGD) WITH PROPOFOL ;  Surgeon: Jinny Carmine, MD;  Location: [facility] ENDOSCOPY;  Service: Endoscopy;  Laterality: N/A;   ESOPHAGOGASTRODUODENOSCOPY (EGD) WITH PROPOFOL  N/A 06/14/2022   Procedure: ESOPHAGOGASTRODUODENOSCOPY (EGD) WITH PROPOFOL ;  Surgeon: Maryruth Ole DASEN, MD;  Location: [facility] ENDOSCOPY;  Service: Endoscopy;  Laterality: N/A;   FRACTURE SURGERY Left    arm   HIP PINNING,CANNULATED Right 12/04/2023   Procedure: FIXATION, FEMUR, NECK, PERCUTANEOUS, USING SCREW;  Surgeon: Tobie Priest, MD;  Location: [facility] ORS;  Service: Orthopedics;  Laterality: Right;   SHOULDER ARTHROSCOPY WITH SUBACROMIAL DECOMPRESSION AND BICEP TENDON REPAIR Right 04/16/2018   Procedure: ARTHROSCOPIC  BICEP TENOTOMY;  Surgeon: Marchia Drivers, MD;  Location: [facility] ORS;  Service: Orthopedics;  Laterality: Right;   UPPER ESOPHAGEAL ENDOSCOPIC ULTRASOUND (EUS)  6/15 ue to repeat in 1 year   Social History   Tobacco Use   Smoking status: Never   Smokeless tobacco: Never  Vaping Use   Vaping status: Never Used  Substance Use Topics   Alcohol use: No   Drug use: No   Social History   Socioeconomic History   Marital status: Divorced    Spouse name: Not on file   Number of children: 3   Years of education: Not on file   Highest education level: GED or equivalent  Occupational History   Not on file  Tobacco Use   Smoking status: Never   Smokeless tobacco: Never  Vaping Use   Vaping status: Never Used  Substance and Sexual Activity   Alcohol use: No   Drug use: No   Sexual activity: Not Currently  Other Topics Concern   Not on file  Social History Narrative   Not on file   Social Drivers of Health   Financial Resource Strain: Low  Risk  (06/19/2018)   Overall Financial Resource Strain (CARDIA)    Difficulty of Paying Living Expenses: Not hard at all  Food Insecurity: No Food Insecurity (12/31/2023)   Hunger Vital Sign    Worried About Running Out of Food in the Last Year: Never true    Ran Out of Food in the Last Year: Never true  Transportation Needs: No Transportation Needs (12/31/2023)   PRAPARE - Administrator, Civil Service (Medical): No    Lack of Transportation (Non-Medical): No  Physical Activity: Inactive (06/19/2018)   Exercise Vital Sign    Days of Exercise per Week: 0 days    Minutes of Exercise per Session: 0 min  Stress: No Stress Concern Present (06/22/2019)   [employer] of Occupational Health - Occupational Stress Questionnaire    Feeling of  Stress : Not at all  Social Connections: Moderately Integrated (12/03/2023)   Social Connection and Isolation Panel    Frequency of Communication with Friends and Family: More than three times a week    Frequency of Social Gatherings with Friends and Family: More than three times a week    Attends Religious Services: More than 4 times per year    Active Member of [employer] or Organizations: Yes    Attends Banker Meetings: 1 to 4 times per year    Marital Status: Divorced  Intimate Partner Violence: Not At Risk (12/31/2023)   Humiliation, Afraid, Rape, and Kick questionnaire    Fear of Current or Ex-Partner: No    Emotionally Abused: No    Physically Abused: No    Sexually Abused: No   Family Status  Relation Name Status   Mother  Deceased   Father  Deceased   Sister  Alive   Sister  Alive   Son  Alive   Son  Alive   Son  Alive  No partnership data on file   Family History  Problem Relation Age of Onset   Hypertension Father    Heart attack Father    Heart disease Sister    Hypertension Sister    Allergies  Allergen Reactions   Losartan  Potassium Other (See Comments)    hyperkalemia      Review of Systems  All other systems reviewed and are negative.     Objective:     BP 134/82 (Cuff Size: Large)   Pulse 100   Temp 97.8 F (36.6 C) (Oral)   Resp 18   Ht 5' 10 (1.778 m)   Wt 182 lb 9.6 oz (82.8 kg)   SpO2 98%   BMI 26.20 kg/m  BP Readings from Last 3 Encounters:  04/15/24 134/82  02/03/24 136/72  12/23/23 132/70   Wt Readings from Last 3 Encounters:  04/15/24 182 lb 9.6 oz (82.8 kg)  02/03/24 171 lb (77.6 kg)  12/23/23 180 lb (81.6 kg)      Physical Exam Constitutional:      Appearance: Normal appearance.  HENT:     Head: Normocephalic and atraumatic.  Eyes:     Conjunctiva/sclera: Conjunctivae normal.  Cardiovascular:     Rate and Rhythm: Normal rate and regular rhythm.  Pulmonary:     Effort: Pulmonary effort is normal.      Breath sounds: Normal breath sounds.  Skin:    General: Skin is warm and dry.     Comments: Abnormal dark mole on chin, picture below  Neurological:     General: No focal deficit present.  Mental Status: He is alert. Mental status is at baseline.  Psychiatric:        Mood and Affect: Mood normal.        Behavior: Behavior normal.      Results for orders placed or performed in visit on 04/15/24  POCT HgB A1C  Result Value Ref Range   Hemoglobin A1C 7.6 (A) 4.0 - 5.6 %   HbA1c POC (<> result, manual entry)     HbA1c, POC (prediabetic range)     HbA1c, POC (controlled diabetic range)      Last CBC Lab Results  Component Value Date   WBC 7.4 12/23/2023   HGB 11.1 (L) 12/23/2023   HCT 36.9 (L) 12/23/2023   MCV 81.5 12/23/2023   MCH 24.5 (L) 12/23/2023   RDW 17.2 (H) 12/23/2023   PLT 609 (H) 12/23/2023   Last metabolic panel Lab Results  Component Value Date   GLUCOSE 134 (H) 12/23/2023   NA 139 12/23/2023   K 4.7 12/23/2023   CL 104 12/23/2023   CO2 23 12/23/2023   BUN 19 12/23/2023   CREATININE 1.65 (H) 12/23/2023   EGFR 43 (L) 12/23/2023   CALCIUM  10.0 12/23/2023   PROT 7.1 12/23/2023   ALBUMIN 3.7 12/04/2023   LABGLOB 2.7 07/17/2016   AGRATIO 1.7 07/17/2016   BILITOT 1.0 12/23/2023   ALKPHOS 81 12/04/2023   AST 19 12/23/2023   ALT 17 12/23/2023   ANIONGAP 12 12/05/2023   Last lipids Lab Results  Component Value Date   CHOL 98 04/03/2023   HDL 39 (L) 04/03/2023   LDLCALC 40 04/03/2023   TRIG 103 04/03/2023   CHOLHDL 2.5 04/03/2023   Last hemoglobin A1c Lab Results  Component Value Date   HGBA1C 7.6 (A) 04/15/2024   Last thyroid  functions Lab Results  Component Value Date   TSH 1.34 06/30/2019   Last vitamin D  Lab Results  Component Value Date   VD25OH 47 02/25/2019   Last vitamin B12 and Folate Lab Results  Component Value Date   VITAMINB12 290 08/29/2021   FOLATE 14.0 03/26/2019      The ASCVD Risk score (Arnett DK, et al.,  2019) failed to calculate for the following reasons:   The valid total cholesterol range is 130 to 320 mg/dL    Assessment & Plan:   Assessment & Plan Type 2 diabetes mellitus with hyperglycemia A1c improved to 7.6. Goal is under 7.5. Metformin  may cause diarrhea. Glimepiride  dose adjusted to prevent hypoglycemia. - Switch to metformin  extended release 750 mg, two tablets daily. - Continue Farxiga  10 mg daily. - Discontinue glimepiride  1 mg and continue glipizide  5 mg daily. - Monitor blood sugars regularly. - Reassess A1c in future visits.  Gastroesophageal reflux disease with esophagitis and chronic constipation with overflow diarrhea Alternating constipation and diarrhea, possibly due to metformin . GERD managed with pantoprazole . - Switch to metformin  extended release to reduce gastrointestinal side effects. - Continue pantoprazole  40 mg daily.  Essential hypertension Blood pressure controlled at 134/82 mmHg on amlodipine . - Continue amlodipine  2.5 mg daily.  Mixed hyperlipidemia Cholesterol levels good last year. On atorvastatin  and Zetia  without side effects. No recent cholesterol check. - Continue atorvastatin  80 mg daily. - Continue Zetia  10 mg daily. - Recheck cholesterol levels.  Chronic kidney disease, stage 3a Farxiga  provides renal protection.  Iron  deficiency anemia Iron  levels previously low, requiring supplementation. - Order labs to recheck iron  levels. - Determine need for continued iron  supplementation based on lab results.  Melanocytic nevus  of scalp/neck (evaluation for possible skin cancer) Dark brown lesion, possibly seborrheic keratosis. - Refer to dermatologist for evaluation of nevus. - Send photo of nevus to dermatologist.  Reactive airway disease Managed with albuterol  inhaler. - Continue albuterol  inhaler as needed for shortness of breath or wheezing.  Allergic rhinitis due to pollen Nasal congestion managed with Flonase . - Refill Flonase   nasal spray.  - POCT HgB A1C - metFORMIN  (GLUCOPHAGE -XR) 750 MG 24 hr tablet; Take 2 tablets (1,500 mg total) by mouth daily with breakfast.  Dispense: 180 tablet; Refill: 1 - CBC w/Diff/Platelet - Fe+TIBC+Fer - Lipid Profile - Comprehensive Metabolic Panel (CMET) - Vitamin D  (25 hydroxy) - Ambulatory referral to Dermatology - fluticasone  (FLONASE ) 50 MCG/ACT nasal spray; Place 2 sprays into both nostrils daily.  Dispense: 16 g; Refill: 6   Return in about 3 months (around 07/16/2024) for follow up on a1c.    Sharyle Fischer, DO

## 2024-04-16 ENCOUNTER — Ambulatory Visit: Payer: Self-pay | Admitting: Internal Medicine

## 2024-04-16 DIAGNOSIS — E559 Vitamin D deficiency, unspecified: Secondary | ICD-10-CM

## 2024-04-16 DIAGNOSIS — D509 Iron deficiency anemia, unspecified: Secondary | ICD-10-CM

## 2024-04-16 LAB — CBC WITH DIFFERENTIAL/PLATELET
Absolute Lymphocytes: 1843 {cells}/uL (ref 850–3900)
Absolute Monocytes: 1075 {cells}/uL — ABNORMAL HIGH (ref 200–950)
Basophils Absolute: 67 {cells}/uL (ref 0–200)
Basophils Relative: 0.7 %
Eosinophils Absolute: 528 {cells}/uL — ABNORMAL HIGH (ref 15–500)
Eosinophils Relative: 5.5 %
HCT: 35.3 % — ABNORMAL LOW (ref 38.5–50.0)
Hemoglobin: 10.7 g/dL — ABNORMAL LOW (ref 13.2–17.1)
MCH: 25.5 pg — ABNORMAL LOW (ref 27.0–33.0)
MCHC: 30.3 g/dL — ABNORMAL LOW (ref 32.0–36.0)
MCV: 84 fL (ref 80.0–100.0)
MPV: 10.1 fL (ref 7.5–12.5)
Monocytes Relative: 11.2 %
Neutro Abs: 6086 {cells}/uL (ref 1500–7800)
Neutrophils Relative %: 63.4 %
Platelets: 501 Thousand/uL — ABNORMAL HIGH (ref 140–400)
RBC: 4.2 Million/uL (ref 4.20–5.80)
RDW: 14.9 % (ref 11.0–15.0)
Total Lymphocyte: 19.2 %
WBC: 9.6 Thousand/uL (ref 3.8–10.8)

## 2024-04-16 LAB — COMPREHENSIVE METABOLIC PANEL WITH GFR
AG Ratio: 1.8 (calc) (ref 1.0–2.5)
ALT: 10 U/L (ref 9–46)
AST: 16 U/L (ref 10–35)
Albumin: 4.3 g/dL (ref 3.6–5.1)
Alkaline phosphatase (APISO): 124 U/L (ref 35–144)
BUN/Creatinine Ratio: 11 (calc) (ref 6–22)
BUN: 18 mg/dL (ref 7–25)
CO2: 22 mmol/L (ref 20–32)
Calcium: 9.1 mg/dL (ref 8.6–10.3)
Chloride: 106 mmol/L (ref 98–110)
Creat: 1.57 mg/dL — ABNORMAL HIGH (ref 0.70–1.28)
Globulin: 2.4 g/dL (ref 1.9–3.7)
Glucose, Bld: 118 mg/dL — ABNORMAL HIGH (ref 65–99)
Potassium: 4.7 mmol/L (ref 3.5–5.3)
Sodium: 138 mmol/L (ref 135–146)
Total Bilirubin: 0.6 mg/dL (ref 0.2–1.2)
Total Protein: 6.7 g/dL (ref 6.1–8.1)
eGFR: 45 mL/min/1.73m2 — ABNORMAL LOW (ref >=60)

## 2024-04-16 LAB — LIPID PANEL
Cholesterol: 82 mg/dL (ref <200)
HDL: 35 mg/dL — ABNORMAL LOW (ref >=40)
LDL Cholesterol (Calc): 31 mg/dL
Non-HDL Cholesterol (Calc): 47 mg/dL (ref <130)
Total CHOL/HDL Ratio: 2.3 (calc) (ref <5.0)
Triglycerides: 81 mg/dL (ref <150)

## 2024-04-16 LAB — IRON,TIBC AND FERRITIN PANEL
%SAT: 6 % — ABNORMAL LOW (ref 20–48)
Ferritin: 5 ng/mL — ABNORMAL LOW (ref 24–380)
Iron: 24 ug/dL — ABNORMAL LOW (ref 50–180)
TIBC: 411 ug/dL (ref 250–425)

## 2024-04-16 LAB — VITAMIN D 25 HYDROXY (VIT D DEFICIENCY, FRACTURES): Vit D, 25-Hydroxy: 29 ng/mL — ABNORMAL LOW (ref 30–100)

## 2024-04-16 MED ORDER — VITAMIN D (ERGOCALCIFEROL) 1.25 MG (50000 UNIT) PO CAPS: 50000.0000 [IU] | ORAL_CAPSULE | ORAL | 0 refills | Status: AC

## 2024-04-16 MED ORDER — FERROUS GLUCONATE 324 (38 FE) MG PO TABS: 324.0000 mg | ORAL_TABLET | Freq: Every day | ORAL | 3 refills | Status: DC

## 2024-06-15 ENCOUNTER — Telehealth: Payer: Self-pay | Admitting: Internal Medicine

## 2024-06-15 DIAGNOSIS — K2100 Gastro-esophageal reflux disease with esophagitis, without bleeding: Secondary | ICD-10-CM

## 2024-06-15 MED ORDER — PANTOPRAZOLE SODIUM 40 MG PO TBEC: 40.0000 mg | DELAYED_RELEASE_TABLET | Freq: Every day | ORAL | 0 refills | Status: DC

## 2024-06-15 NOTE — Telephone Encounter (Signed)
 pantoprazole  (PROTONIX ) 40 MG tablet

## 2024-07-16 ENCOUNTER — Ambulatory Visit: Admitting: Internal Medicine

## 2024-07-16 ENCOUNTER — Telehealth: Payer: Self-pay

## 2024-07-16 ENCOUNTER — Other Ambulatory Visit: Payer: Self-pay

## 2024-07-16 ENCOUNTER — Encounter: Payer: Self-pay | Admitting: Internal Medicine

## 2024-07-16 VITALS — BP 124/74 | HR 93 | Temp 97.8°F | Resp 16 | Ht 70.0 in | Wt 187.9 lb

## 2024-07-16 DIAGNOSIS — E782 Mixed hyperlipidemia: Secondary | ICD-10-CM

## 2024-07-16 DIAGNOSIS — I10 Essential (primary) hypertension: Secondary | ICD-10-CM

## 2024-07-16 DIAGNOSIS — D509 Iron deficiency anemia, unspecified: Secondary | ICD-10-CM

## 2024-07-16 DIAGNOSIS — E1165 Type 2 diabetes mellitus with hyperglycemia: Secondary | ICD-10-CM

## 2024-07-16 DIAGNOSIS — K2100 Gastro-esophageal reflux disease with esophagitis, without bleeding: Secondary | ICD-10-CM

## 2024-07-16 DIAGNOSIS — Z7984 Long term (current) use of oral hypoglycemic drugs: Secondary | ICD-10-CM

## 2024-07-16 LAB — POCT GLYCOSYLATED HEMOGLOBIN (HGB A1C): Hemoglobin A1C: 8.7 % — AB (ref 4.0–5.6)

## 2024-07-16 MED ORDER — AMLODIPINE BESYLATE 2.5 MG PO TABS: 2.5000 mg | ORAL_TABLET | Freq: Every day | ORAL | 1 refills | Status: AC

## 2024-07-16 MED ORDER — METFORMIN HCL ER 750 MG PO TB24: 1500.0000 mg | ORAL_TABLET | Freq: Every day | ORAL | 1 refills | Status: AC

## 2024-07-16 MED ORDER — FERROUS GLUCONATE 324 (38 FE) MG PO TABS: 324.0000 mg | ORAL_TABLET | Freq: Every day | ORAL | 3 refills | Status: AC

## 2024-07-16 MED ORDER — GLIPIZIDE 5 MG PO TABS: 5.0000 mg | ORAL_TABLET | Freq: Two times a day (BID) | ORAL | 1 refills | Status: AC

## 2024-07-16 MED ORDER — FARXIGA 10 MG PO TABS: 10.0000 mg | ORAL_TABLET | Freq: Every morning | ORAL | 1 refills | Status: AC

## 2024-07-16 MED ORDER — ATORVASTATIN CALCIUM 80 MG PO TABS: 80.0000 mg | ORAL_TABLET | Freq: Every day | ORAL | 1 refills | Status: AC

## 2024-07-16 MED ORDER — PANTOPRAZOLE SODIUM 40 MG PO TBEC: 40.0000 mg | DELAYED_RELEASE_TABLET | Freq: Every day | ORAL | 1 refills | Status: AC

## 2024-07-16 NOTE — Progress Notes (Signed)
 "  Established Patient Office Visit  Subjective   Patient ID: Paul Spears, male    DOB: Jun 20, 1945  Age: 79 y.o. MRN: [id number]  Chief Complaint  Patient presents with   Medical Management of Chronic Issues    HPI  Patient is here for follow up on chronic medical conditions. This is my first time meeting him.  Discussed the use of AI scribe software for clinical note transcription with the patient, who gave verbal consent to proceed.  History of Present Illness  Paul Spears is a 79 year old male with diabetes and hypertension who presents for medication review and management.  His A1c has increased from 7.6% in October to 8.7%. He takes metformin  XR 750 mg twice a day instead of the prescribed 1000 mg twice a day and glipizide  5 mg twice a day. He has had difficulty obtaining Farxiga  and has Jardiance  samples but is not taking them. He is worried about the rising cost of his diabetes medications.  His blood pressure is well controlled on amlodipine  2.5 mg. He has amlodipine  5 mg at home but is not using it.  He takes atorvastatin  80 mg with LDL at 31. He does not recall taking Zetia  recently, and it is not on his current list.  He takes pantoprazole  for reflux with adequate control. He takes vitamin D  weekly. He is not taking iron  despite anemia noted in November and reports cold fingers and disliking the taste of iron .  He cannot easily reach his toes due to a prior hip fracture, which limits toenail care. He suspects a possible ingrown toenail but prefers to manage it himself.  Hypertension: -Medications: Amlodipine  2.5 mg -Patient is compliant with above medications and reports no side effects. -Denies any SOB, CP, vision changes, LE edema or symptoms of hypotension  HLD: -Medications: Lipitor  80 mg -Patient is compliant with above medications and reports no side effects.  -Last lipid panel: Lipid Panel     Component Value Date/Time   CHOL 82 04/15/2024 0947   CHOL  172 10/15/2016 0920   TRIG 81 04/15/2024 0947   HDL 35 (L) 04/15/2024 0947   HDL 39 (L) 10/15/2016 0920   CHOLHDL 2.3 04/15/2024 0947   VLDL 23 01/15/2017 0955   LDLCALC 31 04/15/2024 0947   LABVLDL 33 10/15/2016 0920   Diabetes, Type 2: -Last A1c 8.3% 7/25 -Medications: Metformin  750 ER mg BID, Farxiga  10 mg, Glipizide  5 mg BID -Patient is compliant with the above medications and reports no side effects.  -Checking BG at home: No -Eye exam: Due, discuss at follow up -Foot exam: today -Microalbumin: today -Statin: yes -PNA vaccine: Prevnar 13 in 2019, discuss booster at follow up -Denies symptoms of hypoglycemia, polyuria, polydipsia, numbness extremities, foot ulcers/trauma.   Health Maintenance: -Blood work UTD   Patient Active Problem List   Diagnosis Date Noted   Closed right hip fracture (HCC) 12/03/2023   Arthritis of left knee 12/13/2021   Secondary hyperparathyroidism of renal origin 12/04/2021   Reactive airway disease without complication 12/04/2021   Current moderate episode of major depressive disorder, unspecified whether recurrent (HCC) 12/04/2021   Acquired trigger finger of right ring finger 09/24/2019   Anemia in chronic kidney disease 08/23/2019   Essential (hemorrhagic) thrombocythemia (HCC)    Iron  deficiency anemia 11/11/2017   DDD (degenerative disc disease), cervical 12/07/2015   GERD with esophagitis 09/21/2014   Barrett esophagus 09/21/2014   Cervical osteoarthritis 09/21/2014   Type 2 diabetes mellitus (HCC) 09/21/2014  Microalbuminuria 09/21/2014   Osteopenia 09/21/2014   Stage 3a chronic kidney disease (HCC) 09/21/2014   HTN (hypertension) 05/20/2014   Hyperlipidemia 05/20/2014   Past Medical History:  Diagnosis Date   Allergy    Barrett's esophagus    Diabetes mellitus without complication (HCC)    Esophagitis    LA grade C   Essential hypertension 05/20/2014   Formatting of this note might be different from the original.  Last  Assessment & Plan:   BP is well controlled.     Failure of erection 09/21/2014   GERD (gastroesophageal reflux disease)    Hyperlipidemia    Hypertension    Iron  deficiency anemia 11/11/2017   Kidney disease, chronic, stage III (GFR 30-59 ml/min) (HCC)    Pain in joint of left knee 12/13/2021   Pain of left forearm 09/12/2021   Potential for cognitive impairment 05/30/2021   Secondary hyperparathyroidism of renal origin 03/07/2021   Past Surgical History:  Procedure Laterality Date   CARPAL TUNNEL RELEASE Right    COLONOSCOPY  10/26/2012   repeat in 10 years MUS   COLONOSCOPY WITH PROPOFOL  N/A 02/08/2019   Procedure: COLONOSCOPY WITH PROPOFOL ;  Surgeon: Gaylyn Gladis PENNER, MD;  Location: [facility] ENDOSCOPY;  Service: Endoscopy;  Laterality: N/A;   COLONOSCOPY WITH PROPOFOL  N/A 06/14/2022   Procedure: COLONOSCOPY WITH PROPOFOL ;  Surgeon: Maryruth Ole DASEN, MD;  Location: [facility] ENDOSCOPY;  Service: Endoscopy;  Laterality: N/A;   ESOPHAGOGASTRODUODENOSCOPY (EGD) WITH PROPOFOL  N/A 10/22/2016   Procedure: ESOPHAGOGASTRODUODENOSCOPY (EGD) WITH PROPOFOL ;  Surgeon: Gaylyn Gladis PENNER, MD;  Location: [facility] ENDOSCOPY;  Service: Endoscopy;  Laterality: N/A;   ESOPHAGOGASTRODUODENOSCOPY (EGD) WITH PROPOFOL  N/A 07/14/2018   Procedure: ESOPHAGOGASTRODUODENOSCOPY (EGD) WITH PROPOFOL ;  Surgeon: Jinny Carmine, MD;  Location: [facility] ENDOSCOPY;  Service: Endoscopy;  Laterality: N/A;   ESOPHAGOGASTRODUODENOSCOPY (EGD) WITH PROPOFOL  N/A 06/14/2022   Procedure: ESOPHAGOGASTRODUODENOSCOPY (EGD) WITH PROPOFOL ;  Surgeon: Maryruth Ole DASEN, MD;  Location: [facility] ENDOSCOPY;  Service: Endoscopy;  Laterality: N/A;   FRACTURE SURGERY Left    arm   HIP PINNING,CANNULATED Right 12/04/2023   Procedure: FIXATION, FEMUR, NECK, PERCUTANEOUS, USING SCREW;  Surgeon: Tobie Priest, MD;  Location: [facility] ORS;  Service: Orthopedics;  Laterality: Right;   SHOULDER ARTHROSCOPY WITH SUBACROMIAL DECOMPRESSION AND BICEP TENDON REPAIR Right  04/16/2018   Procedure: ARTHROSCOPIC  BICEP TENOTOMY;  Surgeon: Marchia Drivers, MD;  Location: [facility] ORS;  Service: Orthopedics;  Laterality: Right;   UPPER ESOPHAGEAL ENDOSCOPIC ULTRASOUND (EUS)  6/15 ue to repeat in 1 year   Social History   Tobacco Use   Smoking status: Never   Smokeless tobacco: Never  Vaping Use   Vaping status: Never Used  Substance Use Topics   Alcohol use: No   Drug use: No   Social History   Socioeconomic History   Marital status: Divorced    Spouse name: Not on file   Number of children: 3   Years of education: Not on file   Highest education level: 9th grade  Occupational History   Not on file  Tobacco Use   Smoking status: Never   Smokeless tobacco: Never  Vaping Use   Vaping status: Never Used  Substance and Sexual Activity   Alcohol use: No   Drug use: No   Sexual activity: Not Currently  Other Topics Concern   Not on file  Social History Narrative   Not on file   Social Drivers of Health   Tobacco Use: Low Risk (07/16/2024)   Patient History    Smoking  Tobacco Use: Never    Smokeless Tobacco Use: Never    Passive Exposure: Not on file  Financial Resource Strain: Low Risk (07/13/2024)   Overall Financial Resource Strain (CARDIA)    Difficulty of Paying Living Expenses: Not hard at all  Food Insecurity: Unknown (07/13/2024)   Epic    Worried About Programme Researcher, Broadcasting/film/video in the Last Year: Never true    Ran Out of Food in the Last Year: Patient declined  Transportation Needs: No Transportation Needs (07/13/2024)   Epic    Lack of Transportation (Medical): No    Lack of Transportation (Non-Medical): No  Physical Activity: Not on file  Stress: Not on file  Social Connections: Unknown (07/13/2024)   Social Connection and Isolation Panel    Frequency of Communication with Friends and Family: Twice a week    Frequency of Social Gatherings with Friends and Family: Once a week    Attends Religious Services: More than 4 times per year     Active Member of Clubs or Organizations: No    Attends Banker Meetings: Not on file    Marital Status: Patient declined  Intimate Partner Violence: Not At Risk (12/31/2023)   Epic    Fear of Current or Ex-Partner: No    Emotionally Abused: No    Physically Abused: No    Sexually Abused: No  Depression (PHQ2-9): Low Risk (07/16/2024)   Depression (PHQ2-9)    PHQ-2 Score: 0  Alcohol Screen: Low Risk (07/13/2024)   Alcohol Screen    Last Alcohol Screening Score (AUDIT): 0  Housing: Unknown (07/13/2024)   Epic    Unable to Pay for Housing in the Last Year: No    Number of Times Moved in the Last Year: Not on file    Homeless in the Last Year: No  Utilities: Not At Risk (12/31/2023)   Epic    Threatened with loss of utilities: No  Health Literacy: Not on file   Family Status  Relation Name Status   Mother  Deceased   Father  Deceased   Sister  Alive   Sister  Alive   Son  Alive   Son  Alive   Son  Alive  No partnership data on file   Family History  Problem Relation Age of Onset   Hypertension Father    Heart attack Father    Heart disease Sister    Hypertension Sister    Allergies  Allergen Reactions   Losartan  Potassium Other (See Comments)    hyperkalemia      Review of Systems  All other systems reviewed and are negative.     Objective:     BP 124/74 (Cuff Size: Large)   Pulse 93   Temp 97.8 F (36.6 C) (Oral)   Resp 16   Ht 5' 10 (1.778 m)   Wt 187 lb 14.4 oz (85.2 kg)   SpO2 99%   BMI 26.96 kg/m  BP Readings from Last 3 Encounters:  07/16/24 124/74  04/15/24 134/82  02/03/24 136/72   Wt Readings from Last 3 Encounters:  07/16/24 187 lb 14.4 oz (85.2 kg)  04/15/24 182 lb 9.6 oz (82.8 kg)  02/03/24 171 lb (77.6 kg)      Physical Exam Constitutional:      Appearance: Normal appearance.  HENT:     Head: Normocephalic and atraumatic.  Eyes:     Conjunctiva/sclera: Conjunctivae normal.  Cardiovascular:     Rate and Rhythm:  Normal rate and  regular rhythm.     Pulses:          Dorsalis pedis pulses are 2+ on the right side and 2+ on the left side.  Pulmonary:     Effort: Pulmonary effort is normal.     Breath sounds: Normal breath sounds.  Musculoskeletal:     Right foot: Normal range of motion. No deformity, bunion, Charcot foot, foot drop or prominent metatarsal heads.     Left foot: Normal range of motion. No deformity, bunion, Charcot foot, foot drop or prominent metatarsal heads.  Feet:     Right foot:     Protective Sensation: 6 sites tested.  6 sites sensed.     Skin integrity: Skin integrity normal.     Toenail Condition: Right toenails are normal.     Left foot:     Protective Sensation: 6 sites tested.  6 sites sensed.     Skin integrity: Skin integrity normal.     Toenail Condition: Left toenails are normal.  Skin:    General: Skin is warm and dry.     Comments: Abnormal dark mole on chin, picture below  Neurological:     General: No focal deficit present.     Mental Status: He is alert. Mental status is at baseline.  Psychiatric:        Mood and Affect: Mood normal.        Behavior: Behavior normal.     Results for orders placed or performed in visit on 07/16/24  POCT HgB A1C  Result Value Ref Range   Hemoglobin A1C 8.7 (A) 4.0 - 5.6 %   HbA1c POC (<> result, manual entry)     HbA1c, POC (prediabetic range)     HbA1c, POC (controlled diabetic range)      Last CBC Lab Results  Component Value Date   WBC 9.6 04/15/2024   HGB 10.7 (L) 04/15/2024   HCT 35.3 (L) 04/15/2024   MCV 84.0 04/15/2024   MCH 25.5 (L) 04/15/2024   RDW 14.9 04/15/2024   PLT 501 (H) 04/15/2024   Last metabolic panel Lab Results  Component Value Date   GLUCOSE 118 (H) 04/15/2024   NA 138 04/15/2024   K 4.7 04/15/2024   CL 106 04/15/2024   CO2 22 04/15/2024   BUN 18 04/15/2024   CREATININE 1.57 (H) 04/15/2024   EGFR 45 (L) 04/15/2024   CALCIUM  9.1 04/15/2024   PROT 6.7 04/15/2024   ALBUMIN 3.7  12/04/2023   LABGLOB 2.7 07/17/2016   AGRATIO 1.7 07/17/2016   BILITOT 0.6 04/15/2024   ALKPHOS 81 12/04/2023   AST 16 04/15/2024   ALT 10 04/15/2024   ANIONGAP 12 12/05/2023   Last lipids Lab Results  Component Value Date   CHOL 82 04/15/2024   HDL 35 (L) 04/15/2024   LDLCALC 31 04/15/2024   TRIG 81 04/15/2024   CHOLHDL 2.3 04/15/2024   Last hemoglobin A1c Lab Results  Component Value Date   HGBA1C 8.7 (A) 07/16/2024   Last thyroid  functions Lab Results  Component Value Date   TSH 1.34 06/30/2019   Last vitamin D  Lab Results  Component Value Date   VD25OH 29 (L) 04/15/2024   Last vitamin B12 and Folate Lab Results  Component Value Date   VITAMINB12 290 08/29/2021   FOLATE 14.0 03/26/2019      The ASCVD Risk score (Arnett DK, et al., 2019) failed to calculate for the following reasons:   The valid total cholesterol range is 130 to  320 mg/dL    Assessment & Plan:   Assessment & Plan  Type 2 diabetes mellitus with hyperglycemia A1c increased to 8.7%, indicating poor glycemic control. Confusion about medication adherence and potential duplication with Jardiance . Emphasized medication adherence. - Continue metformin  1000 mg BID. - Continue Farxiga  10 mg daily. - Continue glipizide  5 mg BID. - Performed foot exam and urine test for kidney function. - Pharmacist Isaiah to assist with medication affordability.  Essential hypertension Blood pressure controlled at 124/74 mmHg. Confirmed correct dosage and adherence to amlodipine . - Continue amlodipine  2.5 mg daily. - Sent prescription for amlodipine  to pharmacy.  Mixed hyperlipidemia Cholesterol levels controlled with atorvastatin . Discontinued Zetia  as unnecessary. - Continue atorvastatin  80 mg daily. - Discontinued Zetia .  Gastroesophageal reflux disease with esophagitis Pantoprazole  effective but less so than Nexium. - Continue pantoprazole  (Protonix ) as prescribed.  Iron  deficiency anemia Not taking  iron  supplements. Discussed importance of supplementation. - Prescribed iron  supplements every other day. - Sent prescription for iron  supplements to pharmacy.  - POCT HgB A1C - FARXIGA  10 MG TABS tablet; Take 1 tablet (10 mg total) by mouth every morning.  Dispense: 90 tablet; Refill: 1 - glipiZIDE  (GLUCOTROL ) 5 MG tablet; Take 1 tablet (5 mg total) by mouth 2 (two) times daily before a meal.  Dispense: 180 tablet; Refill: 1 - metFORMIN  (GLUCOPHAGE -XR) 750 MG 24 hr tablet; Take 2 tablets (1,500 mg total) by mouth daily with breakfast.  Dispense: 180 tablet; Refill: 1 - Urine Microalbumin w/creat. ratio - HM Diabetes Foot Exam - AMB Referral [facility] Care Management - atorvastatin  (LIPITOR ) 80 MG tablet; Take 1 tablet (80 mg total) by mouth at bedtime.  Dispense: 90 tablet; Refill: 1 - pantoprazole  (PROTONIX ) 40 MG tablet; Take 1 tablet (40 mg total) by mouth daily.  Dispense: 90 tablet; Refill: 1 - amLODipine  (NORVASC ) 2.5 MG tablet; Take 1 tablet (2.5 mg total) by mouth daily.  Dispense: 90 tablet; Refill: 1 - ferrous gluconate  (FERGON) 324 MG tablet; Take 1 tablet (324 mg total) by mouth daily.  Dispense: 90 tablet; Refill: 3   Return in about 3 months (around 10/14/2024) for follow up on a1c.    Sharyle Fischer, DO "

## 2024-07-16 NOTE — Progress Notes (Unsigned)
 Care Guide Pharmacy Note  07/16/2024 Name: Paul Spears MRN: [id number] DOB: [birth year]  Referred By: Bernardo Fend, DO Reason for referral: Complex Care Management and Call Attempt #1 (Unsuccessful initial outreach to schedule with PHARM D- [place])   Paul Spears is a 79 y.o. year old male who is a primary care patient of Bernardo Fend, DO.  Paul Spears was referred to the pharmacist for assistance related to: DMII  An unsuccessful telephone outreach was attempted today to contact the patient who was referred to the pharmacy team for assistance with medication assistance. Additional attempts will be made to contact the patient.  Paul Spears [facility], [facility] Guide  Direct Dial: [phone]  Fax [phone]

## 2024-07-17 LAB — MICROALBUMIN / CREATININE URINE RATIO
Creatinine, Urine: 113 mg/dL (ref 20–320)
Microalb Creat Ratio: 22 mg/g{creat}
Microalb, Ur: 2.5 mg/dL

## 2024-07-19 ENCOUNTER — Ambulatory Visit: Payer: Self-pay | Admitting: Internal Medicine

## 2024-07-20 NOTE — Progress Notes (Signed)
 Care Guide Pharmacy Note  07/20/2024 Name: Paul Spears MRN: [id number] DOB: [birth year]  Referred By: Bernardo Fend, DO Reason for referral: Call Attempt #1 (Unsuccessful initial outreach to schedule with PHARM DGLENWOOD Bun), Complex Care Management (Unsuccessful initial outreach to schedule with [name]), Call Attempt #2, and Call Attempt #3 (Unsuccessful initial outreach to schedule with PHARM D B[place])   Paul Spears is a 79 y.o. year old male who is a primary care patient of Bernardo Fend, DO.  Paul Spears was referred to the pharmacist for assistance related to: DMII  A third unsuccessful telephone outreach was attempted today to contact the patient who was referred to the pharmacy team for assistance with medication assistance. The Population Health team is pleased to engage with this patient at any time in the future upon receipt of referral and should he/she be interested in assistance from the [facility] team.  [facility]  [facility], [facility] Guide  Direct Dial: [phone]  Fax [phone]

## 2024-10-12 ENCOUNTER — Ambulatory Visit: Admitting: Internal Medicine
